# Patient Record
Sex: Female | Born: 1945 | Race: Black or African American | Hispanic: No | Marital: Single | Smoking: Former smoker
Health system: Southern US, Community
[De-identification: ages and names within clinical notes are randomized; demographics above are authoritative.]

## PROBLEM LIST (undated history)

## (undated) DIAGNOSIS — I2510 Atherosclerotic heart disease of native coronary artery without angina pectoris: Secondary | ICD-10-CM

## (undated) DIAGNOSIS — Z95810 Presence of automatic (implantable) cardiac defibrillator: Secondary | ICD-10-CM

## (undated) DIAGNOSIS — Z951 Presence of aortocoronary bypass graft: Secondary | ICD-10-CM

## (undated) DIAGNOSIS — N184 Chronic kidney disease, stage 4 (severe): Secondary | ICD-10-CM

## (undated) DIAGNOSIS — I255 Ischemic cardiomyopathy: Secondary | ICD-10-CM

## (undated) DIAGNOSIS — I5040 Unspecified combined systolic (congestive) and diastolic (congestive) heart failure: Secondary | ICD-10-CM

## (undated) DIAGNOSIS — I10 Essential (primary) hypertension: Secondary | ICD-10-CM

## (undated) DIAGNOSIS — I219 Acute myocardial infarction, unspecified: Secondary | ICD-10-CM

## (undated) DIAGNOSIS — E118 Type 2 diabetes mellitus with unspecified complications: Secondary | ICD-10-CM

## (undated) DIAGNOSIS — E785 Hyperlipidemia, unspecified: Secondary | ICD-10-CM

## (undated) DIAGNOSIS — R7401 Elevation of levels of liver transaminase levels: Secondary | ICD-10-CM

## (undated) DIAGNOSIS — R570 Cardiogenic shock: Secondary | ICD-10-CM

## (undated) DIAGNOSIS — R740 Nonspecific elevation of levels of transaminase and lactic acid dehydrogenase [LDH]: Secondary | ICD-10-CM

## (undated) DIAGNOSIS — K529 Noninfective gastroenteritis and colitis, unspecified: Secondary | ICD-10-CM

## (undated) DIAGNOSIS — K559 Vascular disorder of intestine, unspecified: Secondary | ICD-10-CM

## (undated) DIAGNOSIS — I272 Pulmonary hypertension, unspecified: Secondary | ICD-10-CM

## (undated) HISTORY — PX: CORONARY ARTERY BYPASS GRAFT: SHX141

---

## 2002-10-27 DIAGNOSIS — I2510 Atherosclerotic heart disease of native coronary artery without angina pectoris: Secondary | ICD-10-CM

## 2002-10-27 DIAGNOSIS — I219 Acute myocardial infarction, unspecified: Secondary | ICD-10-CM

## 2002-10-27 DIAGNOSIS — Z951 Presence of aortocoronary bypass graft: Secondary | ICD-10-CM

## 2002-10-27 HISTORY — DX: Acute myocardial infarction, unspecified: I21.9

## 2002-10-27 HISTORY — DX: Atherosclerotic heart disease of native coronary artery without angina pectoris: I25.10

## 2002-10-27 HISTORY — DX: Presence of aortocoronary bypass graft: Z95.1

## 2002-11-05 ENCOUNTER — Inpatient Hospital Stay (HOSPITAL_COMMUNITY): Admission: EM | Admit: 2002-11-05 | Discharge: 2002-11-17 | Payer: Self-pay | Admitting: Emergency Medicine

## 2002-11-06 ENCOUNTER — Encounter: Payer: Self-pay | Admitting: Cardiology

## 2002-11-09 ENCOUNTER — Encounter: Payer: Self-pay | Admitting: Cardiothoracic Surgery

## 2002-11-11 ENCOUNTER — Encounter: Payer: Self-pay | Admitting: Cardiothoracic Surgery

## 2002-11-12 ENCOUNTER — Encounter: Payer: Self-pay | Admitting: Thoracic Surgery (Cardiothoracic Vascular Surgery)

## 2002-11-13 ENCOUNTER — Encounter: Payer: Self-pay | Admitting: Cardiothoracic Surgery

## 2012-05-12 ENCOUNTER — Other Ambulatory Visit: Payer: Self-pay | Admitting: Nurse Practitioner

## 2012-05-12 DIAGNOSIS — E118 Type 2 diabetes mellitus with unspecified complications: Secondary | ICD-10-CM

## 2012-05-12 MED ORDER — GLIMEPIRIDE 4 MG PO TABS: 4.0000 mg | ORAL_TABLET | Freq: Every day | ORAL | Status: DC

## 2012-05-20 ENCOUNTER — Telehealth: Payer: Self-pay | Admitting: Nurse Practitioner

## 2012-05-20 NOTE — Telephone Encounter (Signed)
Please call about Jo Lynn's meds. She will be there at this number until 9 pm tonight.

## 2012-05-22 ENCOUNTER — Telehealth: Payer: Self-pay | Admitting: Family Medicine

## 2012-05-22 NOTE — Telephone Encounter (Signed)
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

## 2012-08-03 ENCOUNTER — Telehealth: Payer: Self-pay | Admitting: Nurse Practitioner

## 2012-08-04 NOTE — Telephone Encounter (Signed)
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

## 2012-08-14 NOTE — Telephone Encounter (Signed)
No response

## 2012-08-17 ENCOUNTER — Encounter: Payer: Self-pay | Admitting: Nurse Practitioner

## 2012-08-17 ENCOUNTER — Ambulatory Visit (INDEPENDENT_AMBULATORY_CARE_PROVIDER_SITE_OTHER): Payer: BLUE CROSS/BLUE SHIELD | Admitting: Nurse Practitioner

## 2012-08-17 VITALS — BP 135/64 | HR 55 | Temp 98.1°F | Ht 66.0 in | Wt 207.0 lb

## 2012-08-17 DIAGNOSIS — E119 Type 2 diabetes mellitus without complications: Secondary | ICD-10-CM

## 2012-08-17 DIAGNOSIS — I10 Essential (primary) hypertension: Secondary | ICD-10-CM

## 2012-08-17 DIAGNOSIS — D649 Anemia, unspecified: Secondary | ICD-10-CM

## 2012-08-17 DIAGNOSIS — E118 Type 2 diabetes mellitus with unspecified complications: Secondary | ICD-10-CM | POA: Insufficient documentation

## 2012-08-17 DIAGNOSIS — E785 Hyperlipidemia, unspecified: Secondary | ICD-10-CM

## 2012-08-17 MED ORDER — GLYBURIDE 5 MG PO TABS: 5.0000 mg | ORAL_TABLET | Freq: Every day | ORAL | Status: DC

## 2012-08-17 MED ORDER — SAXAGLIPTIN HCL 2.5 MG PO TABS: 2.5000 mg | ORAL_TABLET | Freq: Every day | ORAL | Status: DC

## 2012-08-17 MED ORDER — ATORVASTATIN CALCIUM 40 MG PO TABS: 40.0000 mg | ORAL_TABLET | Freq: Every day | ORAL | Status: DC

## 2012-08-17 MED ORDER — GLIMEPIRIDE 4 MG PO TABS: 4.0000 mg | ORAL_TABLET | Freq: Every day | ORAL | Status: DC

## 2012-08-17 MED ORDER — LISINOPRIL-HYDROCHLOROTHIAZIDE 20-25 MG PO TABS: 1.0000 | ORAL_TABLET | Freq: Every day | ORAL | Status: DC

## 2012-08-17 MED ORDER — METOPROLOL TARTRATE 50 MG PO TABS: 50.0000 mg | ORAL_TABLET | Freq: Two times a day (BID) | ORAL | Status: DC

## 2012-08-17 NOTE — Progress Notes (Signed)
Subjective:    Patient ID: Jo Lynn, female    DOB: 1945/03/28, 67 y.o.   MRN: [id number]  Hypertension This is a chronic problem. The current episode started more than 1 year ago. The problem has been resolved since onset. The problem is controlled. Pertinent negatives include no blurred vision, chest pain, headaches, orthopnea, palpitations, peripheral edema, shortness of breath or sweats. There are no associated agents to hypertension. Risk factors for coronary artery disease include dyslipidemia, diabetes mellitus, obesity and post-menopausal state. Past treatments include ACE inhibitors and diuretics. The current treatment provides no improvement. CAD/MI: open heart surgery in 2006.  Hyperlipidemia This is a chronic problem. The current episode started more than 1 year ago. The problem is uncontrolled. Recent lipid tests were reviewed and are high. Exacerbating diseases include diabetes and obesity. Pertinent negatives include no chest pain or shortness of breath. Current antihyperlipidemic treatment includes statins and bile acid squestrants. The current treatment provides moderate improvement of lipids. Compliance problems include adherence to diet and adherence to exercise.  Risk factors for coronary artery disease include diabetes mellitus, hypertension, obesity and post-menopausal.  Diabetes She presents for her follow-up diabetic visit. She has type 2 diabetes mellitus. The initial diagnosis of diabetes was made 10 years ago. Her disease course has been fluctuating. There are no hypoglycemic associated symptoms. Pertinent negatives for hypoglycemia include no headaches or sweats. Pertinent negatives for diabetes include no blurred vision, no chest pain, no polydipsia, no polyphagia, no polyuria, no visual change, no weakness and no weight loss. There are no hypoglycemic complications. Symptoms are improving. There are no diabetic complications. Risk factors for coronary artery disease  include dyslipidemia, hypertension, obesity and post-menopausal. Current diabetic treatment includes diet and oral agent (dual therapy). She is compliant with treatment all of the time. Her weight is stable. When asked about meal planning, she reported none. She has not had a previous visit with a dietician. She rarely participates in exercise. Her home blood glucose trend is fluctuating minimally. Her breakfast blood glucose is taken between 8-9 am. Her breakfast blood glucose range is generally 140-180 mg/dl. Her overall blood glucose range is 140-180 mg/dl. An ACE inhibitor/angiotensin II receptor blocker is being taken. She does not see a podiatrist.Eye exam is current (july 2013).       Review of Systems  Constitutional: Negative for weight loss.  Eyes: Negative for blurred vision.  Respiratory: Negative for shortness of breath.   Cardiovascular: Negative for chest pain, palpitations and orthopnea.  Endocrine: Negative for polydipsia, polyphagia and polyuria.  Neurological: Negative for weakness and headaches.  All other systems reviewed and are negative.       Objective:   Physical Exam  Constitutional: She is oriented to person, place, and time. She appears well-developed and well-nourished.  HENT:  Nose: Nose normal.  Mouth/Throat: Oropharynx is clear and moist.  Eyes: EOM are normal.  Neck: Trachea normal, normal range of motion and full passive range of motion without pain. Neck supple. No JVD present. Carotid bruit is not present. No thyromegaly present.  Cardiovascular: Normal rate, regular rhythm, normal heart sounds and intact distal pulses.  Exam reveals no gallop and no friction rub.   No murmur heard. Pulmonary/Chest: Effort normal and breath sounds normal.  Abdominal: Soft. Bowel sounds are normal. She exhibits no distension and no mass. There is no tenderness.  Musculoskeletal: Normal range of motion.  Lymphadenopathy:    She has no cervical adenopathy.   Neurological: She is alert and oriented to person,  place, and time. She has normal reflexes.  Skin: Skin is warm and dry.  Psychiatric: She has a normal mood and affect. Her behavior is normal. Judgment and thought content normal.   BP 135/64  Pulse 55  Temp(Src) 98.1 F (36.7 C) (Oral)  Ht 5\' 6"  (1.676 m)  Wt 207 lb (93.895 kg)  BMI 33.43 kg/m2  Outside labs HgBA1c was 8.7%      Assessment & Plan:  1. Diabetes Discussed needing insulin but patient refuses  added glyburide to see if will help Daily fasting blood sugars- keep in diary - glyBURIDE (DIABETA) 5 MG tablet; Take 1 tablet (5 mg total) by mouth daily with breakfast.  Dispense: 90 tablet; Refill: 1 - saxagliptin HCl (ONGLYZA) 2.5 MG TABS tablet; Take 1 tablet (2.5 mg total) by mouth daily.  Dispense: 90 tablet; Refill: 1 - glimepiride (AMARYL) 4 MG tablet; Take 1 tablet (4 mg total) by mouth daily before breakfast.  Dispense: 90 tablet; Refill: 1  2. Hypertension Low NA+ diet - aspirin EC 81 MG tablet; Take 81 mg by mouth daily. - lisinopril-hydrochlorothiazide (PRINZIDE,ZESTORETIC) 20-25 MG per tablet; Take 1 tablet by mouth daily.  Dispense: 90 tablet; Refill: 1 - metoprolol (LOPRESSOR) 50 MG tablet; Take 1 tablet (50 mg total) by mouth 2 (two) times daily.  Dispense: 180 tablet; Refill: 1  3. Hyperlipidemia low fat diet an dexercise - WELCHOL 625 MG tablet;  - atorvastatin (LIPITOR) 40 MG tablet; Take 1 tablet (40 mg total) by mouth daily.  Dispense: 90 tablet; Refill: 1  4. Anemia  Mary-Margaret Daphine Deutscher, FNP

## 2012-08-17 NOTE — Patient Instructions (Signed)

## 2012-08-31 ENCOUNTER — Other Ambulatory Visit: Payer: Self-pay | Admitting: Nurse Practitioner

## 2012-08-31 DIAGNOSIS — E119 Type 2 diabetes mellitus without complications: Secondary | ICD-10-CM

## 2012-08-31 MED ORDER — GLYBURIDE 5 MG PO TABS: 5.0000 mg | ORAL_TABLET | Freq: Two times a day (BID) | ORAL | Status: DC

## 2012-09-07 ENCOUNTER — Telehealth: Payer: Self-pay | Admitting: Nurse Practitioner

## 2012-09-07 NOTE — Telephone Encounter (Signed)
Spoke with patient.

## 2013-01-24 ENCOUNTER — Other Ambulatory Visit: Payer: Self-pay | Admitting: Nurse Practitioner

## 2013-01-26 NOTE — Telephone Encounter (Signed)
NTBS.

## 2013-01-26 NOTE — Telephone Encounter (Signed)
Last seen and last lipid and glucose 08/17/12  MMM  Requesting a 90 day supply

## 2013-04-21 ENCOUNTER — Ambulatory Visit (INDEPENDENT_AMBULATORY_CARE_PROVIDER_SITE_OTHER): Payer: BC Managed Care – PPO | Admitting: Nurse Practitioner

## 2013-04-21 ENCOUNTER — Encounter: Payer: Self-pay | Admitting: Nurse Practitioner

## 2013-04-21 VITALS — BP 161/72 | HR 45 | Temp 96.8°F | Ht 66.0 in | Wt 208.0 lb

## 2013-04-21 DIAGNOSIS — I10 Essential (primary) hypertension: Secondary | ICD-10-CM

## 2013-04-21 DIAGNOSIS — E785 Hyperlipidemia, unspecified: Secondary | ICD-10-CM

## 2013-04-21 DIAGNOSIS — E119 Type 2 diabetes mellitus without complications: Secondary | ICD-10-CM

## 2013-04-21 DIAGNOSIS — D649 Anemia, unspecified: Secondary | ICD-10-CM

## 2013-04-21 LAB — POCT GLYCOSYLATED HEMOGLOBIN (HGB A1C): Hemoglobin A1C: 8.9

## 2013-04-21 MED ORDER — METOPROLOL TARTRATE 50 MG PO TABS: ORAL_TABLET | ORAL | Status: DC

## 2013-04-21 MED ORDER — ATORVASTATIN CALCIUM 40 MG PO TABS: ORAL_TABLET | ORAL | Status: DC

## 2013-04-21 MED ORDER — GLYBURIDE 5 MG PO TABS: ORAL_TABLET | ORAL | Status: DC

## 2013-04-21 MED ORDER — LISINOPRIL-HYDROCHLOROTHIAZIDE 20-25 MG PO TABS: 1.0000 | ORAL_TABLET | Freq: Every day | ORAL | Status: DC

## 2013-04-21 NOTE — Patient Instructions (Signed)

## 2013-04-21 NOTE — Progress Notes (Signed)
Subjective:    Patient ID: Jo Lynn, female    DOB: 07-05-1945, 68 y.o.   MRN: [id number]  Patient in today for follow up of chronic medical problems  Hypertension This is a chronic problem. The current episode started more than 1 year ago. The problem has been resolved since onset. The problem is controlled. Pertinent negatives include no blurred vision, chest pain, headaches, orthopnea, palpitations, peripheral edema, shortness of breath or sweats. There are no associated agents to hypertension. Risk factors for coronary artery disease include dyslipidemia, diabetes mellitus, obesity and post-menopausal state. Past treatments include ACE inhibitors and diuretics. The current treatment provides no improvement. CAD/MI: open heart surgery in 2006.  Hyperlipidemia This is a chronic problem. The current episode started more than 1 year ago. The problem is uncontrolled. Recent lipid tests were reviewed and are high. Exacerbating diseases include diabetes and obesity. Pertinent negatives include no chest pain or shortness of breath. Current antihyperlipidemic treatment includes statins and bile acid squestrants. The current treatment provides moderate improvement of lipids. Compliance problems include adherence to diet and adherence to exercise.  Risk factors for coronary artery disease include diabetes mellitus, hypertension, obesity and post-menopausal.  Diabetes She presents for her follow-up diabetic visit. She has type 2 diabetes mellitus. The initial diagnosis of diabetes was made 10 years ago. Her disease course has been fluctuating. There are no hypoglycemic associated symptoms. Pertinent negatives for hypoglycemia include no headaches or sweats. Pertinent negatives for diabetes include no blurred vision, no chest pain, no polydipsia, no polyphagia, no polyuria, no visual change, no weakness and no weight loss. There are no hypoglycemic complications. Symptoms are improving. There are no diabetic  complications. Risk factors for coronary artery disease include dyslipidemia, hypertension, obesity and post-menopausal. Current diabetic treatment includes diet and oral agent (dual therapy). She is compliant with treatment all of the time. Her weight is stable. When asked about meal planning, she reported none. She has not had a previous visit with a dietician. She rarely participates in exercise. Her home blood glucose trend is fluctuating minimally. Her breakfast blood glucose is taken between 8-9 am. Her breakfast blood glucose range is generally 140-180 mg/dl. Her overall blood glucose range is 140-180 mg/dl. An ACE inhibitor/angiotensin II receptor blocker is being taken. She does not see a podiatrist.Eye exam is not current.       Review of Systems  Constitutional: Negative for weight loss.  Eyes: Negative for blurred vision.  Respiratory: Negative for shortness of breath.   Cardiovascular: Negative for chest pain, palpitations and orthopnea.  Endocrine: Negative for polydipsia, polyphagia and polyuria.  Neurological: Negative for weakness and headaches.  All other systems reviewed and are negative.       Objective:   Physical Exam  Constitutional: She is oriented to person, place, and time. She appears well-developed and well-nourished.  HENT:  Nose: Nose normal.  Mouth/Throat: Oropharynx is clear and moist.  Eyes: EOM are normal.  Neck: Trachea normal, normal range of motion and full passive range of motion without pain. Neck supple. No JVD present. Carotid bruit is not present. No thyromegaly present.  Cardiovascular: Normal rate, regular rhythm, normal heart sounds and intact distal pulses.  Exam reveals no gallop and no friction rub.   No murmur heard. Pulmonary/Chest: Effort normal and breath sounds normal.  Abdominal: Soft. Bowel sounds are normal. She exhibits no distension and no mass. There is no tenderness.  Musculoskeletal: Normal range of motion.  Lymphadenopathy:     She has no cervical  adenopathy.  Neurological: She is alert and oriented to person, place, and time. She has normal reflexes.  Skin: Skin is warm and dry.  Psychiatric: She has a normal mood and affect. Her behavior is normal. Judgment and thought content normal.    BP 161/72  Pulse 45  Temp(Src) 96.8 F (36 C) (Oral)  Ht $R'5\' 6"'rQ$  (1.676 m)  Wt 208 lb (94.348 kg)  BMI 33.59 kg/m2 Results for orders placed in visit on 04/21/13  POCT GLYCOSYLATED HEMOGLOBIN (HGB A1C)      Result Value Ref Range   Hemoglobin A1C 8.9            Assessment & Plan:   1. Hypertension   2. Hyperlipidemia   3. Diabetes   4. Anemia    Orders Placed This Encounter  Procedures  . CMP14+EGFR  . NMR, lipoprofile  . POCT glycosylated hemoglobin (Hb A1C)   Meds ordered this encounter  Medications  . metoprolol (LOPRESSOR) 50 MG tablet    Sig: TAKE 1 TABLET TWICE A DAY    Dispense:  180 tablet    Refill:  1    Order Specific Question:  Supervising Provider    Answer:  Chipper Herb [[id number]]  . lisinopril-hydrochlorothiazide (PRINZIDE,ZESTORETIC) 20-25 MG per tablet    Sig: Take 1 tablet by mouth daily.    Dispense:  90 tablet    Refill:  1    Order Specific Question:  Supervising Provider    Answer:  Chipper Herb [[id number]]  . atorvastatin (LIPITOR) 40 MG tablet    Sig: TAKE 1 TABLET DAILY    Dispense:  90 tablet    Refill:  1    Order Specific Question:  Supervising Provider    Answer:  Chipper Herb [[id number]]  . glyBURIDE (DIABETA) 5 MG tablet    Sig: TAKE 1 TABLET TWICE A DAY.    Dispense:  180 tablet    Refill:  1    Order Specific Question:  Supervising Provider    Answer:  Chipper Herb [[id number]]  hemoccult cards patient just started taking onglyza 1 week ago- watch diet Labs pending Health maintenance reviewed Diet and exercise encouraged Continue all meds Follow up  In 3 months   Aguilar, FNP

## 2013-04-23 LAB — CMP14+EGFR
ALT: 13 IU/L (ref 0–32)
AST: 15 IU/L (ref 0–40)
Albumin/Globulin Ratio: 1.6 (ref 1.1–2.5)
Albumin: 4.3 g/dL (ref 3.6–4.8)
Alkaline Phosphatase: 87 IU/L (ref 39–117)
BUN/Creatinine Ratio: 26 (ref 11–26)
BUN: 37 mg/dL — ABNORMAL HIGH (ref 8–27)
CALCIUM: 9.6 mg/dL (ref 8.7–10.3)
CHLORIDE: 104 mmol/L (ref 97–108)
CO2: 20 mmol/L (ref 18–29)
Creatinine, Ser: 1.41 mg/dL — ABNORMAL HIGH (ref 0.57–1.00)
GFR calc Af Amer: 44 mL/min/{1.73_m2} — ABNORMAL LOW (ref >59)
GFR calc non Af Amer: 38 mL/min/{1.73_m2} — ABNORMAL LOW (ref >59)
GLUCOSE: 147 mg/dL — AB (ref 65–99)
Globulin, Total: 2.7 g/dL (ref 1.5–4.5)
POTASSIUM: 4.7 mmol/L (ref 3.5–5.2)
Sodium: 141 mmol/L (ref 134–144)
TOTAL PROTEIN: 7 g/dL (ref 6.0–8.5)
Total Bilirubin: 0.4 mg/dL (ref 0.0–1.2)

## 2013-04-23 LAB — NMR, LIPOPROFILE
Cholesterol: 227 mg/dL — ABNORMAL HIGH (ref <200)
HDL Cholesterol by NMR: 33 mg/dL — ABNORMAL LOW (ref >=40)
HDL PARTICLE NUMBER: 24.2 umol/L — AB (ref >=30.5)
LDL Particle Number: 2089 nmol/L — ABNORMAL HIGH (ref <1000)
LDL SIZE: 20.5 nm — AB (ref >20.5)
LDLC SERPL CALC-MCNC: 165 mg/dL — ABNORMAL HIGH (ref <100)
LP-IR SCORE: 62 — AB (ref <=45)
SMALL LDL PARTICLE NUMBER: 1331 nmol/L — AB (ref <=527)
Triglycerides by NMR: 147 mg/dL (ref <150)

## 2013-04-24 ENCOUNTER — Other Ambulatory Visit: Payer: Self-pay | Admitting: Nurse Practitioner

## 2013-04-26 ENCOUNTER — Telehealth: Payer: Self-pay | Admitting: *Deleted

## 2013-04-26 NOTE — Telephone Encounter (Signed)
Message copied by Josephine Cables on Mon Apr 26, 2013 12:07 PM ------      Message from: Chevis Pretty      Created: Fri Apr 23, 2013  5:05 PM       hgba1c discussed at appointment- patient had just started on onglyza      Nothing to compare kidney and liver function to- creatine elevated- need strict diabetic control because affecting her kidneys      LDL particle # elevated as well as LDL- need to continue lipitor daily and watch diet- will recheck in 3 months ------

## 2013-05-12 NOTE — Telephone Encounter (Signed)
Pt aware of lab results.  She made 54m appt w/ labs for 06/21/13 w/ Mae.   rs

## 2013-06-21 ENCOUNTER — Encounter: Payer: Self-pay | Admitting: General Practice

## 2013-06-21 ENCOUNTER — Ambulatory Visit (INDEPENDENT_AMBULATORY_CARE_PROVIDER_SITE_OTHER): Payer: BC Managed Care – PPO | Admitting: General Practice

## 2013-06-21 VITALS — BP 136/57 | HR 49 | Temp 97.1°F | Ht 66.0 in | Wt 209.0 lb

## 2013-06-21 DIAGNOSIS — I10 Essential (primary) hypertension: Secondary | ICD-10-CM

## 2013-06-21 DIAGNOSIS — E785 Hyperlipidemia, unspecified: Secondary | ICD-10-CM

## 2013-06-21 DIAGNOSIS — E119 Type 2 diabetes mellitus without complications: Secondary | ICD-10-CM

## 2013-06-21 NOTE — Patient Instructions (Signed)

## 2013-06-22 NOTE — Progress Notes (Signed)
Patient ID: Jo Lynn, female   DOB: 17-Sep-1945, 68 y.o.   MRN: [id number]  Patient presents to review and discuss labs drawn at work. Reviewed labs with patient, explaining HDL, LDL, HgbA1c which was 9.9, increased from last results from pcp. Discussed importance of healthy eating, taking medications as prescribed and some form of regular exercise. Maintain 3 month follow up with this office, which is due Jul 19, 2013. Patient verbalized understanding and in agreement.

## 2013-06-24 ENCOUNTER — Encounter: Payer: Self-pay | Admitting: General Practice

## 2013-09-09 ENCOUNTER — Inpatient Hospital Stay (HOSPITAL_COMMUNITY)
Admission: EM | Admit: 2013-09-09 | Discharge: 2013-09-14 | DRG: 248 | Disposition: A | Discharge status: Home / Self Care | Payer: BC Managed Care – PPO | Attending: Cardiology | Admitting: Cardiology

## 2013-09-09 ENCOUNTER — Inpatient Hospital Stay (HOSPITAL_COMMUNITY): Payer: BC Managed Care – PPO

## 2013-09-09 ENCOUNTER — Encounter (HOSPITAL_COMMUNITY)
Admission: EM | Disposition: A | Discharge status: Home / Self Care | Payer: BC Managed Care – PPO | Source: Home / Self Care | Attending: Cardiology

## 2013-09-09 ENCOUNTER — Encounter (HOSPITAL_COMMUNITY): Payer: Self-pay | Admitting: Emergency Medicine

## 2013-09-09 DIAGNOSIS — E1159 Type 2 diabetes mellitus with other circulatory complications: Secondary | ICD-10-CM

## 2013-09-09 DIAGNOSIS — I219 Acute myocardial infarction, unspecified: Secondary | ICD-10-CM

## 2013-09-09 DIAGNOSIS — J9600 Acute respiratory failure, unspecified whether with hypoxia or hypercapnia: Secondary | ICD-10-CM | POA: Diagnosis not present

## 2013-09-09 DIAGNOSIS — I2119 ST elevation (STEMI) myocardial infarction involving other coronary artery of inferior wall: Principal | ICD-10-CM | POA: Diagnosis present

## 2013-09-09 DIAGNOSIS — Z955 Presence of coronary angioplasty implant and graft: Secondary | ICD-10-CM

## 2013-09-09 DIAGNOSIS — I2510 Atherosclerotic heart disease of native coronary artery without angina pectoris: Secondary | ICD-10-CM | POA: Diagnosis present

## 2013-09-09 DIAGNOSIS — I213 ST elevation (STEMI) myocardial infarction of unspecified site: Secondary | ICD-10-CM

## 2013-09-09 DIAGNOSIS — Z6836 Body mass index (BMI) 36.0-36.9, adult: Secondary | ICD-10-CM | POA: Diagnosis not present

## 2013-09-09 DIAGNOSIS — Z7982 Long term (current) use of aspirin: Secondary | ICD-10-CM | POA: Diagnosis not present

## 2013-09-09 DIAGNOSIS — N189 Chronic kidney disease, unspecified: Secondary | ICD-10-CM | POA: Diagnosis present

## 2013-09-09 DIAGNOSIS — I5043 Acute on chronic combined systolic (congestive) and diastolic (congestive) heart failure: Secondary | ICD-10-CM | POA: Diagnosis present

## 2013-09-09 DIAGNOSIS — Z9189 Other specified personal risk factors, not elsewhere classified: Secondary | ICD-10-CM

## 2013-09-09 DIAGNOSIS — R0602 Shortness of breath: Secondary | ICD-10-CM

## 2013-09-09 DIAGNOSIS — I129 Hypertensive chronic kidney disease with stage 1 through stage 4 chronic kidney disease, or unspecified chronic kidney disease: Secondary | ICD-10-CM | POA: Diagnosis present

## 2013-09-09 DIAGNOSIS — R0789 Other chest pain: Secondary | ICD-10-CM | POA: Diagnosis present

## 2013-09-09 DIAGNOSIS — N289 Disorder of kidney and ureter, unspecified: Secondary | ICD-10-CM | POA: Diagnosis present

## 2013-09-09 DIAGNOSIS — I509 Heart failure, unspecified: Secondary | ICD-10-CM | POA: Diagnosis present

## 2013-09-09 DIAGNOSIS — E669 Obesity, unspecified: Secondary | ICD-10-CM | POA: Diagnosis present

## 2013-09-09 DIAGNOSIS — I10 Essential (primary) hypertension: Secondary | ICD-10-CM | POA: Diagnosis present

## 2013-09-09 DIAGNOSIS — I2582 Chronic total occlusion of coronary artery: Secondary | ICD-10-CM | POA: Diagnosis present

## 2013-09-09 DIAGNOSIS — E785 Hyperlipidemia, unspecified: Secondary | ICD-10-CM | POA: Diagnosis present

## 2013-09-09 DIAGNOSIS — R0902 Hypoxemia: Secondary | ICD-10-CM | POA: Diagnosis present

## 2013-09-09 DIAGNOSIS — I25810 Atherosclerosis of coronary artery bypass graft(s) without angina pectoris: Secondary | ICD-10-CM | POA: Diagnosis present

## 2013-09-09 DIAGNOSIS — Z79899 Other long term (current) drug therapy: Secondary | ICD-10-CM | POA: Diagnosis not present

## 2013-09-09 DIAGNOSIS — I255 Ischemic cardiomyopathy: Secondary | ICD-10-CM | POA: Clinically undetermined

## 2013-09-09 DIAGNOSIS — I2589 Other forms of chronic ischemic heart disease: Secondary | ICD-10-CM | POA: Diagnosis present

## 2013-09-09 DIAGNOSIS — D649 Anemia, unspecified: Secondary | ICD-10-CM | POA: Diagnosis present

## 2013-09-09 DIAGNOSIS — J9601 Acute respiratory failure with hypoxia: Secondary | ICD-10-CM

## 2013-09-09 DIAGNOSIS — Z87891 Personal history of nicotine dependence: Secondary | ICD-10-CM

## 2013-09-09 DIAGNOSIS — Z66 Do not resuscitate: Secondary | ICD-10-CM | POA: Diagnosis present

## 2013-09-09 DIAGNOSIS — I25700 Atherosclerosis of coronary artery bypass graft(s), unspecified, with unstable angina pectoris: Secondary | ICD-10-CM

## 2013-09-09 DIAGNOSIS — Y831 Surgical operation with implant of artificial internal device as the cause of abnormal reaction of the patient, or of later complication, without mention of misadventure at the time of the procedure: Secondary | ICD-10-CM | POA: Diagnosis present

## 2013-09-09 DIAGNOSIS — E118 Type 2 diabetes mellitus with unspecified complications: Secondary | ICD-10-CM | POA: Diagnosis present

## 2013-09-09 DIAGNOSIS — I25710 Atherosclerosis of autologous vein coronary artery bypass graft(s) with unstable angina pectoris: Secondary | ICD-10-CM | POA: Diagnosis present

## 2013-09-09 DIAGNOSIS — I5021 Acute systolic (congestive) heart failure: Secondary | ICD-10-CM

## 2013-09-09 HISTORY — DX: Atherosclerotic heart disease of native coronary artery without angina pectoris: I25.10

## 2013-09-09 HISTORY — PX: LEFT HEART CATHETERIZATION WITH CORONARY ANGIOGRAM: SHX5451

## 2013-09-09 HISTORY — DX: Unspecified combined systolic (congestive) and diastolic (congestive) heart failure: I50.40

## 2013-09-09 HISTORY — DX: Presence of aortocoronary bypass graft: Z95.1

## 2013-09-09 HISTORY — DX: Ischemic cardiomyopathy: I25.5

## 2013-09-09 HISTORY — DX: Type 2 diabetes mellitus with unspecified complications: E11.8

## 2013-09-09 HISTORY — DX: Essential (primary) hypertension: I10

## 2013-09-09 HISTORY — DX: Acute myocardial infarction, unspecified: I21.9

## 2013-09-09 HISTORY — DX: Hyperlipidemia, unspecified: E78.5

## 2013-09-09 LAB — HEMOGLOBIN A1C
Hgb A1c MFr Bld: 10.8 % — ABNORMAL HIGH (ref <5.7)
MEAN PLASMA GLUCOSE: 263 mg/dL — AB (ref <117)

## 2013-09-09 LAB — GLUCOSE, CAPILLARY
GLUCOSE-CAPILLARY: 376 mg/dL — AB (ref 70–99)
Glucose-Capillary: 383 mg/dL — ABNORMAL HIGH (ref 70–99)
Glucose-Capillary: 350 mg/dL — ABNORMAL HIGH (ref 70–99)

## 2013-09-09 LAB — COMPREHENSIVE METABOLIC PANEL
ALT: 19 U/L (ref 0–35)
AST: 23 U/L (ref 0–37)
Albumin: 3.9 g/dL (ref 3.5–5.2)
Alkaline Phosphatase: 86 U/L (ref 39–117)
Anion gap: 24 — ABNORMAL HIGH (ref 5–15)
BILIRUBIN TOTAL: 0.5 mg/dL (ref 0.3–1.2)
BUN: 36 mg/dL — ABNORMAL HIGH (ref 6–23)
CO2: 17 meq/L — AB (ref 19–32)
CREATININE: 1.16 mg/dL — AB (ref 0.50–1.10)
Calcium: 9.5 mg/dL (ref 8.4–10.5)
Chloride: 100 mEq/L (ref 96–112)
GFR, EST AFRICAN AMERICAN: 54 mL/min — AB (ref >90)
GFR, EST NON AFRICAN AMERICAN: 47 mL/min — AB (ref >90)
GLUCOSE: 391 mg/dL — AB (ref 70–99)
Potassium: 4.8 mEq/L (ref 3.7–5.3)
Sodium: 141 mEq/L (ref 137–147)
Total Protein: 7.8 g/dL (ref 6.0–8.3)

## 2013-09-09 LAB — CBC WITH DIFFERENTIAL/PLATELET
BASOS PCT: 0 % (ref 0–1)
Basophils Absolute: 0 10*3/uL (ref 0.0–0.1)
Eosinophils Absolute: 0 10*3/uL (ref 0.0–0.7)
Eosinophils Relative: 0 % (ref 0–5)
HEMATOCRIT: 36.7 % (ref 36.0–46.0)
HEMOGLOBIN: 12 g/dL (ref 12.0–15.0)
LYMPHS ABS: 0.5 10*3/uL — AB (ref 0.7–4.0)
LYMPHS PCT: 4 % — AB (ref 12–46)
MCH: 28.6 pg (ref 26.0–34.0)
MCHC: 32.7 g/dL (ref 30.0–36.0)
MCV: 87.6 fL (ref 78.0–100.0)
MONO ABS: 0.5 10*3/uL (ref 0.1–1.0)
MONOS PCT: 4 % (ref 3–12)
NEUTROS ABS: 13.2 10*3/uL — AB (ref 1.7–7.7)
NEUTROS PCT: 92 % — AB (ref 43–77)
Platelets: 270 10*3/uL (ref 150–400)
RBC: 4.19 MIL/uL (ref 3.87–5.11)
RDW: 15.8 % — ABNORMAL HIGH (ref 11.5–15.5)
WBC: 14.2 10*3/uL — ABNORMAL HIGH (ref 4.0–10.5)

## 2013-09-09 LAB — CREATININE, SERUM
Creatinine, Ser: 1.24 mg/dL — ABNORMAL HIGH (ref 0.50–1.10)
GFR calc non Af Amer: 43 mL/min — ABNORMAL LOW (ref >90)
GFR, EST AFRICAN AMERICAN: 50 mL/min — AB (ref >90)

## 2013-09-09 LAB — TSH: TSH: 0.072 u[IU]/mL — ABNORMAL LOW (ref 0.350–4.500)

## 2013-09-09 LAB — POCT ACTIVATED CLOTTING TIME
Activated Clotting Time: 495 seconds
Activated Clotting Time: 112 seconds

## 2013-09-09 LAB — TROPONIN I
Troponin I: >20 ng/mL (ref <0.30)
Troponin I: >20 ng/mL (ref <0.30)

## 2013-09-09 LAB — PROTIME-INR
INR: 1.02 (ref 0.00–1.49)
INR: 1.33 (ref 0.00–1.49)
Prothrombin Time: 13.4 seconds (ref 11.6–15.2)
Prothrombin Time: 16.5 seconds — ABNORMAL HIGH (ref 11.6–15.2)

## 2013-09-09 LAB — MRSA PCR SCREENING: MRSA by PCR: NEGATIVE

## 2013-09-09 LAB — CBC
HEMATOCRIT: 31.1 % — AB (ref 36.0–46.0)
Hemoglobin: 10 g/dL — ABNORMAL LOW (ref 12.0–15.0)
MCH: 28.2 pg (ref 26.0–34.0)
MCHC: 32.2 g/dL (ref 30.0–36.0)
MCV: 87.9 fL (ref 78.0–100.0)
Platelets: 239 10*3/uL (ref 150–400)
RBC: 3.54 MIL/uL — ABNORMAL LOW (ref 3.87–5.11)
RDW: 15.5 % (ref 11.5–15.5)
WBC: 8.8 10*3/uL (ref 4.0–10.5)

## 2013-09-09 LAB — PRO B NATRIURETIC PEPTIDE: Pro B Natriuretic peptide (BNP): 2561 pg/mL — ABNORMAL HIGH (ref 0–125)

## 2013-09-09 LAB — I-STAT TROPONIN, ED: TROPONIN I, POC: 0.01 ng/mL (ref 0.00–0.08)

## 2013-09-09 LAB — APTT
APTT: 45 s — AB (ref 24–37)
aPTT: 24 seconds (ref 24–37)

## 2013-09-09 LAB — MAGNESIUM: MAGNESIUM: 1.7 mg/dL (ref 1.5–2.5)

## 2013-09-09 SURGERY — LEFT HEART CATHETERIZATION WITH CORONARY ANGIOGRAM
Anesthesia: LOCAL

## 2013-09-09 MED ORDER — HEPARIN SODIUM (PORCINE) 5000 UNIT/ML IJ SOLN: 5000.0000 [IU] | Freq: Three times a day (TID) | INTRAMUSCULAR | Status: DC

## 2013-09-09 MED ORDER — TIROFIBAN HCL IV 5 MG/100ML
INTRAVENOUS | Status: AC
Start: 2013-09-09 — End: 2013-09-09
  Filled 2013-09-09: qty 100

## 2013-09-09 MED ORDER — ASPIRIN EC 81 MG PO TBEC: 81.0000 mg | DELAYED_RELEASE_TABLET | Freq: Every day | ORAL | Status: DC

## 2013-09-09 MED ORDER — INSULIN ASPART 100 UNIT/ML ~~LOC~~ SOLN: 0.0000 [IU] | Freq: Three times a day (TID) | SUBCUTANEOUS | Status: DC

## 2013-09-09 MED ORDER — ROCURONIUM BROMIDE 50 MG/5ML IV SOLN
INTRAVENOUS | Status: AC
  Filled 2013-09-09: qty 2

## 2013-09-09 MED ORDER — FENTANYL CITRATE 0.05 MG/ML IJ SOLN
INTRAMUSCULAR | Status: AC
  Filled 2013-09-09: qty 2

## 2013-09-09 MED ORDER — TICAGRELOR 90 MG PO TABS
ORAL_TABLET | ORAL | Status: AC
  Administered 2013-09-09: 90 mg via ORAL
  Filled 2013-09-09: qty 2

## 2013-09-09 MED ORDER — FUROSEMIDE 10 MG/ML IJ SOLN
20.0000 mg | Freq: Two times a day (BID) | INTRAMUSCULAR | Status: DC
  Administered 2013-09-09 – 2013-09-11 (×5): 20 mg via INTRAVENOUS
  Filled 2013-09-09 (×7): qty 2

## 2013-09-09 MED ORDER — TIROFIBAN HCL IV 5 MG/100ML
0.0750 ug/kg/min | INTRAVENOUS | Status: AC
  Filled 2013-09-09: qty 100

## 2013-09-09 MED ORDER — SODIUM CHLORIDE 0.9 % IV SOLN: INTRAVENOUS | Status: DC

## 2013-09-09 MED ORDER — ONDANSETRON HCL 4 MG/2ML IJ SOLN
INTRAMUSCULAR | Status: AC
  Administered 2013-09-09: 4 mg
  Filled 2013-09-09: qty 2

## 2013-09-09 MED ORDER — FUROSEMIDE 10 MG/ML IJ SOLN
INTRAMUSCULAR | Status: AC
  Administered 2013-09-10: 20 mg via INTRAVENOUS
  Filled 2013-09-09: qty 4

## 2013-09-09 MED ORDER — HEPARIN (PORCINE) IN NACL 2-0.9 UNIT/ML-% IJ SOLN
INTRAMUSCULAR | Status: AC
  Filled 2013-09-09: qty 1000

## 2013-09-09 MED ORDER — ASPIRIN 81 MG PO CHEW: 81.0000 mg | CHEWABLE_TABLET | Freq: Every day | ORAL | Status: DC

## 2013-09-09 MED ORDER — INSULIN ASPART 100 UNIT/ML ~~LOC~~ SOLN
0.0000 [IU] | SUBCUTANEOUS | Status: DC
  Administered 2013-09-09 – 2013-09-10 (×2): 15 [IU] via SUBCUTANEOUS
  Administered 2013-09-10 (×4): 4 [IU] via SUBCUTANEOUS
  Administered 2013-09-11 (×2): 7 [IU] via SUBCUTANEOUS
  Administered 2013-09-11: 3 [IU] via SUBCUTANEOUS

## 2013-09-09 MED ORDER — NITROGLYCERIN 0.2 MG/ML ON CALL CATH LAB
INTRAVENOUS | Status: AC
  Filled 2013-09-09: qty 1

## 2013-09-09 MED ORDER — INSULIN ASPART 100 UNIT/ML ~~LOC~~ SOLN
0.0000 [IU] | Freq: Three times a day (TID) | SUBCUTANEOUS | Status: DC
  Administered 2013-09-09 (×2): 15 [IU] via SUBCUTANEOUS

## 2013-09-09 MED ORDER — ACETAMINOPHEN 325 MG PO TABS: 650.0000 mg | ORAL_TABLET | ORAL | Status: DC | PRN

## 2013-09-09 MED ORDER — DOBUTAMINE IN D5W 4-5 MG/ML-% IV SOLN: 2.5000 ug/kg/min | INTRAVENOUS | Status: DC

## 2013-09-09 MED ORDER — ONDANSETRON HCL 4 MG/2ML IJ SOLN: 4.0000 mg | Freq: Four times a day (QID) | INTRAMUSCULAR | Status: DC | PRN

## 2013-09-09 MED ORDER — MORPHINE SULFATE 2 MG/ML IJ SOLN
INTRAMUSCULAR | Status: AC
  Filled 2013-09-09: qty 1

## 2013-09-09 MED ORDER — DOPAMINE HCL 40 MG/ML IV SOLN
2.0000 ug/kg/min | INTRAVENOUS | Status: DC
  Filled 2013-09-09: qty 40

## 2013-09-09 MED ORDER — BIOTENE DRY MOUTH MT LIQD
15.0000 mL | Freq: Three times a day (TID) | OROMUCOSAL | Status: DC
  Administered 2013-09-10 – 2013-09-12 (×8): 15 mL via OROMUCOSAL

## 2013-09-09 MED ORDER — FUROSEMIDE 10 MG/ML IJ SOLN
INTRAMUSCULAR | Status: AC
  Filled 2013-09-09: qty 4

## 2013-09-09 MED ORDER — ATORVASTATIN CALCIUM 80 MG PO TABS
80.0000 mg | ORAL_TABLET | Freq: Every day | ORAL | Status: DC
  Administered 2013-09-09 – 2013-09-13 (×5): 80 mg via ORAL
  Filled 2013-09-09 (×6): qty 1

## 2013-09-09 MED ORDER — ACETAMINOPHEN 325 MG PO TABS
650.0000 mg | ORAL_TABLET | ORAL | Status: DC | PRN
  Administered 2013-09-09 – 2013-09-11 (×3): 650 mg via ORAL
  Filled 2013-09-09 (×3): qty 2

## 2013-09-09 MED ORDER — ASPIRIN EC 81 MG PO TBEC
81.0000 mg | DELAYED_RELEASE_TABLET | Freq: Every day | ORAL | Status: DC
  Administered 2013-09-10 – 2013-09-14 (×5): 81 mg via ORAL
  Filled 2013-09-09 (×5): qty 1

## 2013-09-09 MED ORDER — MIDAZOLAM HCL 2 MG/2ML IJ SOLN
INTRAMUSCULAR | Status: AC
  Filled 2013-09-09: qty 4

## 2013-09-09 MED ORDER — LIDOCAINE HCL (CARDIAC) 20 MG/ML IV SOLN
INTRAVENOUS | Status: AC
  Filled 2013-09-09: qty 5

## 2013-09-09 MED ORDER — HEPARIN SODIUM (PORCINE) 5000 UNIT/ML IJ SOLN
5000.0000 [IU] | Freq: Three times a day (TID) | INTRAMUSCULAR | Status: DC
  Administered 2013-09-09 – 2013-09-14 (×15): 5000 [IU] via SUBCUTANEOUS
  Filled 2013-09-09 (×18): qty 1

## 2013-09-09 MED ORDER — FUROSEMIDE 10 MG/ML IJ SOLN
40.0000 mg | Freq: Once | INTRAMUSCULAR | Status: AC
  Administered 2013-09-09: 40 mg via INTRAVENOUS

## 2013-09-09 MED ORDER — NITROGLYCERIN 0.4 MG SL SUBL: 0.4000 mg | SUBLINGUAL_TABLET | SUBLINGUAL | Status: DC | PRN

## 2013-09-09 MED ORDER — PANTOPRAZOLE SODIUM 40 MG IV SOLR
40.0000 mg | Freq: Every day | INTRAVENOUS | Status: DC
  Administered 2013-09-09: 40 mg via INTRAVENOUS
  Filled 2013-09-09 (×2): qty 40

## 2013-09-09 MED ORDER — BIVALIRUDIN 250 MG IV SOLR
INTRAVENOUS | Status: AC
  Filled 2013-09-09: qty 250

## 2013-09-09 MED ORDER — LIDOCAINE HCL (PF) 1 % IJ SOLN
INTRAMUSCULAR | Status: AC
  Filled 2013-09-09: qty 30

## 2013-09-09 MED ORDER — METOPROLOL TARTRATE 50 MG PO TABS
50.0000 mg | ORAL_TABLET | Freq: Two times a day (BID) | ORAL | Status: DC
  Administered 2013-09-09 – 2013-09-10 (×3): 50 mg via ORAL
  Filled 2013-09-09 (×6): qty 1

## 2013-09-09 MED ORDER — ONDANSETRON HCL 4 MG/2ML IJ SOLN
4.0000 mg | Freq: Four times a day (QID) | INTRAMUSCULAR | Status: DC | PRN
  Administered 2013-09-09 – 2013-09-10 (×3): 4 mg via INTRAVENOUS
  Filled 2013-09-09 (×3): qty 2

## 2013-09-09 MED ORDER — SUCCINYLCHOLINE CHLORIDE 20 MG/ML IJ SOLN
INTRAMUSCULAR | Status: AC
  Filled 2013-09-09: qty 1

## 2013-09-09 MED ORDER — TICAGRELOR 90 MG PO TABS
90.0000 mg | ORAL_TABLET | Freq: Two times a day (BID) | ORAL | Status: DC
  Administered 2013-09-09 – 2013-09-14 (×10): 90 mg via ORAL
  Filled 2013-09-09 (×11): qty 1

## 2013-09-09 MED ORDER — MORPHINE SULFATE 2 MG/ML IJ SOLN
2.0000 mg | Freq: Once | INTRAMUSCULAR | Status: AC
  Administered 2013-09-09: 2 mg via INTRAVENOUS

## 2013-09-09 MED ORDER — MIDAZOLAM HCL 2 MG/2ML IJ SOLN
INTRAMUSCULAR | Status: AC
  Filled 2013-09-09: qty 2

## 2013-09-09 MED ORDER — ETOMIDATE 2 MG/ML IV SOLN
INTRAVENOUS | Status: AC
  Filled 2013-09-09: qty 20

## 2013-09-09 MED ORDER — HEPARIN SODIUM (PORCINE) 5000 UNIT/ML IJ SOLN
INTRAMUSCULAR | Status: AC
  Administered 2013-09-09: 5000 [IU] via SUBCUTANEOUS
  Filled 2013-09-09: qty 1

## 2013-09-09 MED ORDER — PHENYLEPHRINE HCL 10 MG/ML IJ SOLN
30.0000 ug/min | INTRAVENOUS | Status: DC
  Filled 2013-09-09: qty 4

## 2013-09-09 NOTE — Brief Op Note (Signed)
   Brief Cardiac Catheterization Note:  NAME:  Jo Lynn   MRN: [id number] DOB:  08-23-1945   ADMIT DATE: 09/09/2013  Indication: 1. Inferolateral STEMI 2. 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 Left Heart Catheterization with Native Coroanry and Graft Angiography via Right Common Femoral Artery access  RFA 6Fr sheath - JL4, JR4 for LCA, RCA & SVG angiography -- after PCI, Angled Pigtail (LV Hemodynamics & LV Gram) then IMA for non-selective IMA angiography 2. Successful 2 site PCI of SVG-OM1-OM2 with 2 Rebel BMS stents:  4.0 mm x 12 mm in sequential limb (crossing initial anastomosis) & 4.0 mm x 16 mm mid primary limb; both post-dilated to 4.3 mm  AL1 Guide, Prowater wire; Emerge 2.5 mm x 12 mm predilation (reperfusion with wire) 3. Attempted, but aborted PTCA of SVG-rPDA -- aborted as the vessel appears chronically occluded.  AL1 Guide, Prowater wire; Emerge 2.5 mm x 12 mm predilation (reperfusion with wire)  Medications:  1 mg Versed IV; 50 mcg Fentanyl IV  Angiomax bolus & gtt  Aggrestat bolus & gtt  Brilinta 180 mg  IV Lasix 40mg   Impression:  Severe native CAD with 100% LAD, Cx & mid RCA (RVM-distal Cx collaterals)  Acute 100% thrombotic occlusion of SVG-OM1 with 95% stenosis is sequential limb just beyond OM1 anastomosis  Successful PCI of both lesions as noted above  Subacute to chronic 100% occlusion of SVG-rPDA  Widely patent LIMA-LAD  Hemodynamics:   Central AoP: 99/62/78 mmHg  LVP/EDP: 97/18/26 mmHg  Severe Ischemic Cardiomyopathy: LVEF ~20%  Recommendations:  Admit to ICU - not a Fast Track patient  Confirm EF with Echo  Hold ACE-I initially; will need IV diuresis & titration of CHF medications  DAPT x at least 1 month, but would continue x 1 yr.  Full note to follow  Leonie Man, M.D., M.S. Interventional Cardiologist   Pager # [phone]  09/09/2013 7:57 AM

## 2013-09-09 NOTE — Progress Notes (Signed)
Chaplain met briefly with family members and offered emotional support while patient was in the Cath Lab. Will follow up as needed.  cdb

## 2013-09-09 NOTE — ED Provider Notes (Signed)
CSN: [id number]     Arrival date & time 09/09/13  0539 History   First MD Initiated Contact with Patient 09/09/13 [phone]     Chief Complaint  Patient presents with  . Code STEMI     (Consider location/radiation/quality/duration/timing/severity/associated sxs/prior Treatment) HPI Patient presents with left lateral chest pain that radiates into her left thoracic back. This started at roughly 4:45 AM. EMS was called. Patient has had multiple episodes of vomiting. She was given a 325 mg aspirin and 3 sublingual nitroglycerin. She was also given 2 doses of morphine. Her vital signs remained stable in route. Patient does have a history of coronary artery disease requiring bypass grafting in 2004. EMS EKG with ST segment elevation in code STEMI was activated. Past Medical History  Diagnosis Date  . Diabetes mellitus without complication   . Hypertension   . Hyperlipidemia   . Cardiovascular disease   . Coronary artery disease    Past Surgical History  Procedure Laterality Date  . Coronary artery bypass graft     Family History  Problem Relation Age of Onset  . Diabetes Mother   . Diabetes Father    History  Substance Use Topics  . Smoking status: Former Research scientist (life sciences)  . Smokeless tobacco: Not on file  . Alcohol Use: No   OB History   Grav Para Term Preterm Abortions TAB SAB Ect Mult Living                 Review of Systems  Unable to perform ROS: Acuity of condition  Cardiovascular: Positive for chest pain.  Gastrointestinal: Positive for nausea and vomiting.  All other systems reviewed and are negative.     Allergies  Review of patient's allergies indicates no known allergies.  Home Medications   Prior to Admission medications   Medication Sig Start Date End Date Taking? Authorizing Provider  aspirin EC 81 MG tablet Take 325 mg by mouth daily.    Yes Historical Provider, MD  atorvastatin (LIPITOR) 40 MG tablet TAKE 1 TABLET DAILY 04/21/13  Yes Mary-Margaret Hassell Done, FNP   glyBURIDE (DIABETA) 5 MG tablet TAKE 1 TABLET TWICE A DAY. 04/21/13  Yes Mary-Margaret Hassell Done, FNP  metoprolol (LOPRESSOR) 50 MG tablet TAKE 1 TABLET TWICE A DAY 04/21/13  Yes Mary-Margaret Hassell Done, FNP  [facility] 625 MG tablet 625 mg 6 (six) times daily.  07/27/12  Yes Historical Provider, MD  lisinopril-hydrochlorothiazide (PRINZIDE,ZESTORETIC) 20-25 MG per tablet Take 1 tablet by mouth daily. 04/21/13   Mary-Margaret Hassell Done, FNP  saxagliptin HCl (ONGLYZA) 2.5 MG TABS tablet Take 1 tablet (2.5 mg total) by mouth daily. 08/17/12   Mary-Margaret Hassell Done, FNP   BP 121/98  Resp 25  Ht 5\' 7"  (1.702 m)  Wt 209 lb (94.802 kg)  BMI 32.73 kg/m2  SpO2 94% Physical Exam  Nursing note and vitals reviewed. Constitutional: She is oriented to person, place, and time. She appears well-developed and well-nourished. No distress.  Actively vomiting in the emergency department.  HENT:  Head: Normocephalic and atraumatic.  Mouth/Throat: Oropharynx is clear and moist.  Eyes: EOM are normal. Pupils are equal, round, and reactive to light.  Neck: Normal range of motion. Neck supple.  Cardiovascular: Normal rate and regular rhythm.  Exam reveals no gallop and no friction rub.   No murmur heard. Pulmonary/Chest: Effort normal and breath sounds normal. No respiratory distress. She has no wheezes. She has no rales. She exhibits no tenderness.  Abdominal: Soft. Bowel sounds are normal. She exhibits no distension and no  mass. There is no tenderness. There is no rebound and no guarding.  Musculoskeletal: Normal range of motion. She exhibits no edema and no tenderness.  Mild bilateral lower extremity edema.  Neurological: She is alert and oriented to person, place, and time.  Moves all extremities. Sensation is grossly intact.  Skin: Skin is warm and dry. No rash noted. No erythema.  Psychiatric: She has a normal mood and affect. Her behavior is normal.    ED Course  Procedures (including critical care time) Labs  Review Labs Reviewed  APTT  CBC  COMPREHENSIVE METABOLIC PANEL  [place], ED    Imaging Review No results found.   EKG Interpretation None     EKG with inferior and lateral ST elevation. MDM   Final diagnoses:  None   Dr. Radford Pax at bedside. Patient taken to cath lab.    Julianne Rice, MD 09/09/13 [phone]

## 2013-09-09 NOTE — Progress Notes (Signed)
Right femoral arterial sheath removed intact. Manual pressure held for 38min to right groin. Hemostasis achieved.  No bleeding or hematoma noted. 4x4 pressure dressing applied. Post activity and precautions explained; patient verbalized understanding. Right distal pulses intact. Nurse in to observe groin. Level 0 confirmed x2RNs. VSS. Call bell near.Jo Lynn

## 2013-09-09 NOTE — Interval H&P Note (Signed)
History and Physical Interval Note:  09/09/2013 6:34 AM  Catalina Pizza  has presented today for surgery, with the diagnosis of STEMI   LIMA-LAD, SVG-OM1-OM2, SVG-PDA in 2006   The various methods of treatment have been discussed with the patient and family. After consideration of risks, benefits and other options for treatment, the patient has consented to  Procedure(s): LEFT HEART CATHETERIZATION WITH CORONARY ANGIOGRAM (N/A) as a surgical intervention .  The patient's history has been reviewed, patient examined, no change in status, stable for surgery.  I have reviewed the patient's chart and labs.  Questions were answered to the patient's satisfaction.     Tawanda Schall W

## 2013-09-09 NOTE — Progress Notes (Signed)
MD paged about pt's anxiety and unable to wean from non-rebreather. PA called and said MD was coming to bedside.  Will continue to monitor closely and update as needed.

## 2013-09-09 NOTE — ED Notes (Signed)
To cath lab.

## 2013-09-09 NOTE — ED Notes (Signed)
Pt from home.  [facility] called out for c/o back pain/chest pain.  I/O to L leg in place.  Pt given 4mg  Morphine x 2, 325mg  Aspirin, and 3 SL Nitro prior to arrival.  Pt not on O2 per EMS she didn't c/o SOB.

## 2013-09-09 NOTE — H&P (Addendum)
Admit date: 09/09/2013 Referring Physician: Dr. Lita Mains Primary Cardiologist:   None Chief complaint/reason for admission:Chest pain  HPI: This is a 68yo AAF with a history of DM, HTN, dyslipidemia and ASCAD s/p remote CABG in 2004 who presents with chest pain.  She is a poor historian and could not tell me who her Cardiologist is or where she had her CABG.  She awakened about 4:45am with chest tightness and severe back pain.  She called EMS and was found by EKG to have acute inferior ST elevation c/w STEMI.  She was transferred to [facility].  Currently she complains of severe back pain but CP is better. She cannot given any other history due to severe pain.      PMH:    Past Medical History  Diagnosis Date  . Diabetes mellitus without complication   . Hypertension   . Hyperlipidemia   . Cardiovascular disease   . Coronary artery disease     PSH:    Past Surgical History  Procedure Laterality Date  . Coronary artery bypass graft      ALLERGIES:   Review of patient's allergies indicates no known allergies.  Prior to Admit Meds:   (Not in a hospital admission) Family HX:    Family History  Problem Relation Age of Onset  . Diabetes Mother   . Diabetes Father    Social HX:    History   Social History  . Marital Status: Single    Spouse Name: N/A    Number of Children: N/A  . Years of Education: N/A   Occupational History  . Not on file.   Social History Main Topics  . Smoking status: Former Research scientist (life sciences)  . Smokeless tobacco: Not on file  . Alcohol Use: No  . Drug Use: No  . Sexual Activity: Not on file   Other Topics Concern  . Not on file   Social History Narrative  . No narrative on file     ROS:  All 11 ROS were addressed and are negative except what is stated in the HPI  PHYSICAL EXAM Filed Vitals:   09/09/13 0546  BP: 121/98  Resp: 25   General: Well developed, well nourished, in no acute distress Head: Eyes PERRLA, No xanthomas.   Normal cephalic and  atramatic  Lungs:   Clear bilaterally to auscultation and percussion. Heart:   HRRR S1 S2 Pulses are 2+ & equal.            No carotid bruit. No JVD.  No abdominal bruits. No femoral bruits. Abdomen: Bowel sounds are positive, abdomen soft and non-tender without masses Extremities:   No clubbing, cyanosis or edema.  Trace DP pulses NEURO:  Alert and oriented X 3. Psych:  Good affect, responds appropriately   Labs:   No results found for this basename: WBC, HGB, HCT, MCV, PLT   No results found for this basename: NA, K, CL, CO2, BUN, CREATININE, CALCIUM, LABALBU, PROT, BILITOT, ALKPHOS, ALT, AST, GLUCOSE,  in the last 168 hours No results found for this basename: CKTOTAL, CKMB, CKMBINDEX, TROPONINI   No results found for this basename: PTT   No results found for this basename: INR, PROTIME     Lab Results  Component Value Date   CHOL 227* 04/21/2013   Lab Results  Component Value Date   HDL 33* 04/21/2013   Lab Results  Component Value Date   LDLCALC 165* 04/21/2013   Lab Results  Component Value Date   TRIG 147  04/21/2013   No results found for this basename: CHOLHDL   No results found for this basename: LDLDIRECT      Radiology:  No results found.  EKG:  NSR with 73mm of ST elevation in inferolateral leads with ST depression in V1 and V2 and early R wave transition in V2 c/w acute inferoposterolateral MI  ASSESSMENT:  1.  Acute inferoposterolateral STEMI 2.  ASCAD with remote CABG - patient cannot tell me where CABG was done and not notes in EPIC 3.  Dyslipidemia 4.  HTN 5.  DM  PLAN:   1.  Admit to CCU 2.  Emergent cath  3.  ASA/IV Heparin gtt 4.  Check FLP in am 5.  Increase Liptor to 80mg  daily 6.  Continue BB 7.  2D echo to assess LVF  Sueanne Margarita, MD  09/09/2013  5:57 AM

## 2013-09-09 NOTE — Progress Notes (Signed)
UR Completed.  Jo Lynn Jane [phone] 09/09/2013  

## 2013-09-09 NOTE — Progress Notes (Signed)
Lt. IO removed without difficulty. Vaseline gauze dsg applied site WNL.

## 2013-09-09 NOTE — Consult Note (Signed)
PULMONARY / CRITICAL CARE MEDICINE   Name: Jo Lynn MRN: [id number] DOB: 05/04/1945    ADMISSION DATE:  09/09/2013 CONSULTATION DATE:  09/09/2013  REFERRING MD :  Ellyn Hack PRIMARY SERVICE: PCCM  CHIEF COMPLAINT:  Dyspnea  BRIEF PATIENT DESCRIPTION: 68 yo female with h/o CAD, CABG, DM. Admitted to [facility] 7/16 as code STEMI. Had PCI x 2 vessels in cath lab. Later in day with respiratory distress. PCCM to see.   SIGNIFICANT EVENTS / STUDIES:  7/16 STEMI > to cath lab - successful 2 site PCI of SVG-OM1-OM2. LVEF 20% by LVgram  LINES / TUBES: Foley 7/16 >>>  CULTURES: None  ANTIBIOTICS: N/a  HISTORY OF PRESENT ILLNESS:  68 year old female with PMH as below, which includes MI, CABG x 4 (2004) and DM. Presented to [facility] ED 7/16 c/o chest pain. In ED found to have ST segment elevation in the inferior leads. Was admitted under cardiology and taken emergently to cath lab and underwent successful 2 site PCI of SVG-OM1-OM2 with 2 BMS stents. Did have issues with oxygenation and hypotension while in cath lab.    PAST MEDICAL HISTORY :  Past Medical History  Diagnosis Date  . Diabetes mellitus without complication   . Hypertension   . Hyperlipidemia   . Myocardial infarction 10/2002  . 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 Coronary artery bypass graft     Prior to Admission medications   Medication Sig Start Date End Date Taking? Authorizing Provider  aspirin 325 MG tablet Take 325 mg by mouth daily.   Yes Historical Provider, MD  atorvastatin (LIPITOR) 40 MG tablet Take 40 mg by mouth every morning.    Yes Historical Provider, MD  glyBURIDE (DIABETA) 5 MG tablet Take 5 mg by mouth 2 (two) times daily with a meal.   Yes Historical Provider, MD   lisinopril-hydrochlorothiazide (PRINZIDE,ZESTORETIC) 20-25 MG per tablet Take 1 tablet by mouth daily. 04/21/13  Yes Mary-Margaret Hassell Done, FNP  metoprolol (LOPRESSOR) 50 MG tablet Take 50 mg by mouth 2 (two) times daily.   Yes Historical Provider, MD  saxagliptin HCl (ONGLYZA) 2.5 MG TABS tablet Take 1 tablet (2.5 mg total) by mouth daily. 08/17/12  Yes Mary-Margaret Hassell Done, FNP   No Known Allergies  FAMILY HISTORY:  Family History  Problem Relation Age of Onset  . Diabetes Mother   . Diabetes Father    SOCIAL HISTORY:  reports that she has quit smoking. She does not have any smokeless tobacco history on file. She reports that she does not drink alcohol or use illicit drugs.  REVIEW OF SYSTEMS:  Deferred due to dyspnea  SUBJECTIVE:   VITAL SIGNS: Temp:  [97.6 F (36.4 C)-98.1 F (36.7 C)] 97.9 F (36.6 C) (07/16 1600) Pulse Rate:  [83-123] 89 (07/16 1720) Resp:  [13-34] 26 (07/16 1720) BP: (91-155)/(29-112) 99/58 mmHg (07/16 1700) SpO2:  [70 %-100 %] 92 % (07/16 1720) Arterial Line BP: (119-140)/(68-78) 119/68 mmHg (07/16 1300) Weight:  [94.802 kg (209 lb)] 94.802 kg (209 lb) (07/16 0559) HEMODYNAMICS:   VENTILATOR SETTINGS:   INTAKE / OUTPUT: Intake/Output     07/15 0701 - 07/16 0700 07/16 0701 - 07/17 0700   P.O.  480   I.V. (mL/kg)  79.8 (0.8)   Total Intake(mL/kg)  559.8 (5.9)   Urine (mL/kg/hr)  1225 (1.1)   Total Output   1225   Net   -665.3          PHYSICAL EXAMINATION: General: Obese female in respiratory distress Neuro:  Alert, oriented.  HEENT:  [place]/AT no JVD observed, difficult with neck girth.  Cardiovascular:  Tachy, regular Lungs:  Diffuse crackles, exp wheeze on left Abdomen:  Obese, soft, non-tender, non -distended Musculoskeletal:  No acute deformity or ROM limitation.  Skin:  Intact  LABS:  CBC  Recent Labs Lab 09/09/13 0700 09/09/13 1055  WBC 8.8 14.2*  HGB 10.0* 12.0  HCT 31.1* 36.7  PLT 239 270   Coag's  Recent Labs Lab  09/09/13 0550 09/09/13 1055  APTT 24 45*  INR 1.02 1.33   BMET  Recent Labs Lab 09/09/13 0550 09/09/13 1055  NA 141  --   K 4.8  --   CL 100  --   CO2 17*  --   BUN 36*  --   CREATININE 1.16* 1.24*  GLUCOSE 391*  --    Electrolytes  Recent Labs Lab 09/09/13 0550 09/09/13 1055  CALCIUM 9.5  --   MG  --  1.7   Sepsis Markers No results found for this basename: LATICACIDVEN, PROCALCITON, O2SATVEN,  in the last 168 hours ABG No results found for this basename: PHART, PCO2ART, PO2ART,  in the last 168 hours Liver Enzymes  Recent Labs Lab 09/09/13 0550  AST 23  ALT 19  ALKPHOS 86  BILITOT 0.5  ALBUMIN 3.9   Cardiac Enzymes  Recent Labs Lab 09/09/13 1055  TROPONINI >20.00*  PROBNP 2561.0*   Glucose  Recent Labs Lab 09/09/13 1145 09/09/13 1635  GLUCAP 383* 376*    Imaging No results found.   ASSESSMENT / PLAN:  PULMONARY A: Acute respiratory failure likely due to pulmonary edema Pulmonary Edema  P:   Continuous BiPAP Low threshold to intubate in setting of acute decompensation. Diuresis per primary team (Lasix 20mg  BID) F/u CXR  CARDIOVASCULAR A:  S/p PCI for STEMI Acute systolic CHF LVEF 10% CAD  P:  Management per cardiology If requires pressors, preferred agent levophed per cards Avoid tachycardia Continue metoprolol  RENAL A:   CKD baseline creat 1.4  P:   Monitor BMP KVO IVF  GASTROINTESTINAL A:   GI ppx  P:   PPI  HEMATOLOGIC A:   Anemia  P:  Follow cbc  INFECTIOUS A:   No acute evidence of infection  P:   Monitor clinically  ENDOCRINE A:  DM  P:   CBG checks and SSI while NPO  NEUROLOGIC A:   Lethargy - greatly improved on BiPAP  P:   Monitor   Georgann Housekeeper, ACNP [place] [facility]/Critical [facility] Pager [phone] or [phone]   I have personally obtained a history, examined the patient, evaluated laboratory and imaging results, formulated the assessment and plan and placed  orders. CRITICAL CARE: The patient is critically ill with multiple organ systems failure and requires high complexity decision making for assessment and support, frequent evaluation and titration of therapies, application of advanced monitoring technologies and extensive interpretation of multiple databases. Critical Care Time devoted to patient care services described in this note is 60 minutes.    Baltazar Apo, MD, PhD 09/09/2013, 11:18 PM [place] [facility] [phone] or if no answer [phone]

## 2013-09-09 NOTE — Progress Notes (Signed)
Foley huddle complete.  Pt being diuresed.  On bedrest from cath lab with femoral sheath in place. Pt very confused. Huddle complete with Lillia Abed. Tamala Julian, Holly Church and myself, Adella Hare.  Peri care done prior to sterile technique insertion of foley catheter.  No signs of trauma. Will monitor pt closely and update as needed.

## 2013-09-09 NOTE — Progress Notes (Signed)
Around 1800, pt became very anxious, 02 sats dropping on 100% non-rebreather.  MD paged and came to bedside. See MD notes for further intervention.  Emotional support given to pt & to family member at bedside. Will continue to monitor closely and update as needed.

## 2013-09-09 NOTE — Progress Notes (Signed)
Patient with severe left ventricular dysfunction. She had 2 stents in obtuse marginal earlier today. She has an ejection fraction of 15%. Over the course of the day, she has become progressively short of breath. Her oxygen requirement has increased. Her oxygen saturations have been decreasing.  She has been given more Lasix.  We gave 40 mg IV about 10 minutes ago and she has not put out much urine.  Regardless, her sats are in the low 80s on 100% NRB.  On exam, she has bilateral rales and coarse breath sounds.  She is tachypnic and anxious.  HR 110.  BP stable.   I had a long discussion with the patient's daughter and the patient. I explained to her that she was not getting enough oxygen. The patient is insistent that she just wants the heat and that she will be fine. I explained to her that we cannot take the mask off. The daughter understands the situation. The patient does not have any type of out of hospital DO NOT RESUSCITATE order. The patient has never expressed these types of wishes to her daughter.  The patient is a full code. I spoke to critical care. We will start BiPAP. If she does not respond to BiPAP, we'll prepare for intubation. I explained to the daughter that this will also help reduce the strain on the patient's heart.  Critical care time 35 minutes.

## 2013-09-10 ENCOUNTER — Encounter (HOSPITAL_COMMUNITY): Payer: Self-pay | Admitting: Cardiology

## 2013-09-10 ENCOUNTER — Inpatient Hospital Stay (HOSPITAL_COMMUNITY): Payer: BC Managed Care – PPO

## 2013-09-10 DIAGNOSIS — I5043 Acute on chronic combined systolic (congestive) and diastolic (congestive) heart failure: Secondary | ICD-10-CM | POA: Diagnosis not present

## 2013-09-10 DIAGNOSIS — J9600 Acute respiratory failure, unspecified whether with hypoxia or hypercapnia: Secondary | ICD-10-CM | POA: Diagnosis not present

## 2013-09-10 DIAGNOSIS — I2119 ST elevation (STEMI) myocardial infarction involving other coronary artery of inferior wall: Secondary | ICD-10-CM | POA: Diagnosis not present

## 2013-09-10 DIAGNOSIS — E1159 Type 2 diabetes mellitus with other circulatory complications: Secondary | ICD-10-CM

## 2013-09-10 DIAGNOSIS — Z9189 Other specified personal risk factors, not elsewhere classified: Secondary | ICD-10-CM

## 2013-09-10 DIAGNOSIS — I519 Heart disease, unspecified: Secondary | ICD-10-CM

## 2013-09-10 DIAGNOSIS — I25810 Atherosclerosis of coronary artery bypass graft(s) without angina pectoris: Secondary | ICD-10-CM | POA: Diagnosis not present

## 2013-09-10 DIAGNOSIS — I255 Ischemic cardiomyopathy: Secondary | ICD-10-CM | POA: Clinically undetermined

## 2013-09-10 DIAGNOSIS — R0902 Hypoxemia: Secondary | ICD-10-CM

## 2013-09-10 DIAGNOSIS — I25710 Atherosclerosis of autologous vein coronary artery bypass graft(s) with unstable angina pectoris: Secondary | ICD-10-CM | POA: Diagnosis present

## 2013-09-10 LAB — LIPID PANEL
Cholesterol: 206 mg/dL — ABNORMAL HIGH (ref 0–200)
HDL: 45 mg/dL (ref >39)
LDL Cholesterol: 136 mg/dL — ABNORMAL HIGH (ref 0–99)
Total CHOL/HDL Ratio: 4.6 RATIO
Triglycerides: 124 mg/dL (ref <150)
VLDL: 25 mg/dL (ref 0–40)

## 2013-09-10 LAB — GLUCOSE, CAPILLARY
GLUCOSE-CAPILLARY: 158 mg/dL — AB (ref 70–99)
GLUCOSE-CAPILLARY: 303 mg/dL — AB (ref 70–99)
GLUCOSE-CAPILLARY: 173 mg/dL — AB (ref 70–99)
GLUCOSE-CAPILLARY: 173 mg/dL — AB (ref 70–99)
Glucose-Capillary: 191 mg/dL — ABNORMAL HIGH (ref 70–99)

## 2013-09-10 LAB — CBC
HCT: 39.6 % (ref 36.0–46.0)
Hemoglobin: 12.6 g/dL (ref 12.0–15.0)
MCH: 28.6 pg (ref 26.0–34.0)
MCHC: 31.8 g/dL (ref 30.0–36.0)
MCV: 90 fL (ref 78.0–100.0)
PLATELETS: 235 10*3/uL (ref 150–400)
RBC: 4.4 MIL/uL (ref 3.87–5.11)
RDW: 15.9 % — ABNORMAL HIGH (ref 11.5–15.5)
WBC: 13.1 10*3/uL — AB (ref 4.0–10.5)

## 2013-09-10 LAB — BASIC METABOLIC PANEL
ANION GAP: 19 — AB (ref 5–15)
Anion gap: 18 — ABNORMAL HIGH (ref 5–15)
BUN: 47 mg/dL — ABNORMAL HIGH (ref 6–23)
BUN: 45 mg/dL — ABNORMAL HIGH (ref 6–23)
CALCIUM: 9.2 mg/dL (ref 8.4–10.5)
CHLORIDE: 101 meq/L (ref 96–112)
CO2: 21 mEq/L (ref 19–32)
CO2: 22 mEq/L (ref 19–32)
CREATININE: 1.56 mg/dL — AB (ref 0.50–1.10)
Calcium: 9.2 mg/dL (ref 8.4–10.5)
Chloride: 105 mEq/L (ref 96–112)
Creatinine, Ser: 1.69 mg/dL — ABNORMAL HIGH (ref 0.50–1.10)
GFR calc Af Amer: 38 mL/min — ABNORMAL LOW (ref >90)
GFR calc non Af Amer: 30 mL/min — ABNORMAL LOW (ref >90)
GFR, EST AFRICAN AMERICAN: 35 mL/min — AB (ref >90)
GFR, EST NON AFRICAN AMERICAN: 33 mL/min — AB (ref >90)
GLUCOSE: 181 mg/dL — AB (ref 70–99)
Glucose, Bld: 330 mg/dL — ABNORMAL HIGH (ref 70–99)
POTASSIUM: 4.6 meq/L (ref 3.7–5.3)
Potassium: 5.1 mEq/L (ref 3.7–5.3)
SODIUM: 141 meq/L (ref 137–147)
SODIUM: 145 meq/L (ref 137–147)

## 2013-09-10 LAB — TROPONIN I
Troponin I: >20 ng/mL (ref <0.30)
Troponin I: >20 ng/mL (ref <0.30)
Troponin I: >20 ng/mL (ref <0.30)

## 2013-09-10 LAB — PROTIME-INR
INR: 1.16 (ref 0.00–1.49)
Prothrombin Time: 14.8 seconds (ref 11.6–15.2)

## 2013-09-10 MED ORDER — GLYBURIDE 5 MG PO TABS
5.0000 mg | ORAL_TABLET | Freq: Two times a day (BID) | ORAL | Status: DC
  Administered 2013-09-10 – 2013-09-14 (×8): 5 mg via ORAL
  Filled 2013-09-10 (×10): qty 1

## 2013-09-10 MED ORDER — LISINOPRIL 20 MG PO TABS
20.0000 mg | ORAL_TABLET | Freq: Every day | ORAL | Status: DC
  Administered 2013-09-10: 20 mg via ORAL
  Filled 2013-09-10 (×2): qty 1

## 2013-09-10 MED ORDER — PANTOPRAZOLE SODIUM 40 MG PO TBEC
40.0000 mg | DELAYED_RELEASE_TABLET | Freq: Every day | ORAL | Status: DC
  Administered 2013-09-10 – 2013-09-14 (×5): 40 mg via ORAL
  Filled 2013-09-10 (×5): qty 1

## 2013-09-10 MED ORDER — LINAGLIPTIN 5 MG PO TABS
5.0000 mg | ORAL_TABLET | Freq: Every day | ORAL | Status: DC
  Administered 2013-09-10 – 2013-09-14 (×5): 5 mg via ORAL
  Filled 2013-09-10 (×5): qty 1

## 2013-09-10 MED FILL — Sodium Chloride IV Soln 0.9%: INTRAVENOUS | Qty: 50 | Status: AC

## 2013-09-10 NOTE — Progress Notes (Signed)
Patient requested that BiPAP be removed. 100% non rebreather applied. Patient is slightly sob and O2 sat is 95% and respirations are 26. Will continue to monitor for signs of distress.

## 2013-09-10 NOTE — Progress Notes (Addendum)
Inpatient Diabetes Program Recommendations  AACE/ADA: New Consensus Statement on Inpatient Glycemic Control (2013)  Target Ranges:  Prepandial:   less than 140 mg/dL      Peak postprandial:   less than 180 mg/dL (1-2 hours)      Critically ill patients:  140 - 180 mg/dL     Results for Jo Lynn, Jo Lynn (MRN [id number]) as of 09/10/2013 11:42  Ref. Range 09/09/2013 11:45 09/09/2013 16:35 09/09/2013 21:38  Glucose-Capillary Latest Range: 70-99 mg/dL 383 (H) 376 (H) 350 (H)    Results for Jo Lynn, Jo Lynn (MRN [id number]) as of 09/10/2013 11:42  Ref. Range 09/10/2013 04:27 09/10/2013 07:46  Glucose-Capillary Latest Range: 70-99 mg/dL 191 (H) 158 (H)    Results for Jo Lynn, Jo Lynn (MRN [id number]) as of 09/10/2013 11:42  Ref. Range 09/09/2013 10:55  Hemoglobin A1C Latest Range: <5.7 % 10.8 (H)     Patient admitted with CP.  History of DM, HTN, CABG.  Home DM Meds:  Glyburide 5 mg bid Onglyza 2.5 mg daily   *Note Novolog Resistant SSI started Q4 hours yesterday at 12 noon.  CBGs have steadily come down.  **A1c shows extremely poor control at home.  Patient sees Chevis Pretty, NP with [facility].  Patient checks CBGs usually only once per week.  Still works at a job.  Patient told me she was told by her PCP that she needed to start insulin but patient stated she doesn't want to take insulin b/c she is still working.  **Spoke to patient about her current A1c of 10.8%.  Explained what an A1c is and what it measures.  Reminded patient that her goal A1c is 7% or less per ADA standards to prevent both acute and long-term complications.  Encouraged patient to check her CBGs at least once daily at home (vary the time of day she checks and record all CBGs in a logbook for her PCP to review).  Also discussed the possibility of starting insulin.  Patient again told me she doesn't want to take insulin and that she has heard "bad things" about insulin.  When asked what "bad  things" she has heard about patient could not tell me specifically what drugs or what "bad things" she had heard.  Discussed with patient that depending on what her body's needs are, there are several kinds of insulins available nowadays.  Patient stated she would take insulin if she absolutely had to, but that she really didn't want to.  **Patient needs close follow-up with her PCP in [facility] for further DM management.  Likely needs insulin at home.    Will follow. Wyn Quaker RN, MSN, CDE Diabetes Coordinator Inpatient Diabetes Program Team Pager: [phone] (8a-10p)

## 2013-09-10 NOTE — Progress Notes (Signed)
PULMONARY / CRITICAL CARE MEDICINE   Name: Charisma Charlot MRN: [id number] DOB: Sep 04, 1945    ADMISSION DATE:  09/09/2013 CONSULTATION DATE:  09/09/2013  REFERRING MD :  Ellyn Hack PRIMARY SERVICE: PCCM  CHIEF COMPLAINT:  Dyspnea  BRIEF PATIENT DESCRIPTION: 68 yo female with h/o CAD, CABG, DM. Admitted to [facility] 7/16 as code STEMI. Had PCI x 2 vessels in cath lab. Later in day with respiratory distress. PCCM to see.   SIGNIFICANT EVENTS / STUDIES:  7/16 STEMI > to cath lab - successful 2 site PCI of SVG-OM1-OM2. LVEF 20% by LVgram  LINES / TUBES: Foley 7/16 >>>  CULTURES: None  ANTIBIOTICS: N/a  SUBJECTIVE: off BiPAP now, improving with lasix.  VITAL SIGNS: Temp:  [97.3 F (36.3 C)-98.9 F (37.2 C)] 98.9 F (37.2 C) (07/17 0805) Pulse Rate:  [77-114] 93 (07/17 0700) Resp:  [13-35] 19 (07/17 0700) BP: (88-130)/(29-88) 122/67 mmHg (07/17 0700) SpO2:  [81 %-100 %] 94 % (07/17 0700) Arterial Line BP: (119-125)/(68-69) 119/68 mmHg (07/16 1300) FiO2 (%):  [60 %-100 %] 100 % (07/17 0600) HEMODYNAMICS:   VENTILATOR SETTINGS: Vent Mode:  [-] BIPAP FiO2 (%):  [60 %-100 %] 100 % Set Rate:  [8 bmp] 8 bmp INTAKE / OUTPUT: Intake/Output     07/16 0701 - 07/17 0700 07/17 0701 - 07/18 0700   P.O. 480    I.V. (mL/kg) 79.8 (0.8)    Total Intake(mL/kg) 559.8 (5.9)    Urine (mL/kg/hr) 1740 (0.8) 200 (0.5)   Total Output 1740 200   Net -1180.3 -200         PHYSICAL EXAMINATION: General: Obese female in no respiratory distress Neuro:  Alert, oriented.  HEENT:  [place]/AT no JVD observed, difficult with neck girth.  Cardiovascular: Tachy, regular Lungs:  Diffuse crackles, exp wheeze on left Abdomen:  Obese, soft, non-tender, non -distended Musculoskeletal:  No acute deformity or ROM limitation.  Skin:  Intact  LABS:  CBC  Recent Labs Lab 09/09/13 0700 09/09/13 1055 09/10/13 0105  WBC 8.8 14.2* 13.1*  HGB 10.0* 12.0 12.6  HCT 31.1* 36.7 39.6  PLT 239 270 235    Coag's  Recent Labs Lab 09/09/13 0550 09/09/13 1055 09/10/13 0105  APTT 24 45*  --   INR 1.02 1.33 1.16   BMET  Recent Labs Lab 09/09/13 0550 09/09/13 1055 09/10/13 0105 09/10/13 0744  NA 141  --  141 145  K 4.8  --  4.6 5.1  CL 100  --  101 105  CO2 17*  --  21 22  BUN 36*  --  47* 45*  CREATININE 1.16* 1.24* 1.69* 1.56*  GLUCOSE 391*  --  330* 181*   Electrolytes  Recent Labs Lab 09/09/13 0550 09/09/13 1055 09/10/13 0105 09/10/13 0744  CALCIUM 9.5  --  9.2 9.2  MG  --  1.7  --   --    Sepsis Markers No results found for this basename: LATICACIDVEN, PROCALCITON, O2SATVEN,  in the last 168 hours ABG No results found for this basename: PHART, PCO2ART, PO2ART,  in the last 168 hours Liver Enzymes  Recent Labs Lab 09/09/13 0550  AST 23  ALT 19  ALKPHOS 86  BILITOT 0.5  ALBUMIN 3.9   Cardiac Enzymes  Recent Labs Lab 09/09/13 1055 09/09/13 1848 09/10/13 0105 09/10/13 0744  TROPONINI >20.00* >20.00* >20.00* >20.00*  PROBNP 2561.0*  --   --   --    Glucose  Recent Labs Lab 09/09/13 1145 09/09/13 1635 09/09/13 2138 09/10/13 0427  GLUCAP 383* 376* 350* 191*    Imaging Dg Chest Port 1 View  09/10/2013   CLINICAL DATA:  Evaluate edema  EXAM: PORTABLE CHEST - 1 VIEW  COMPARISON:  09/09/2013  FINDINGS: Unchanged cardiomegaly. Upper mediastinal contours are stable. Noted changes of CABG.  Bilateral airspace disease is again seen, persistent on the right but with significant clearing on the left. Although the asymmetry is concerning for pneumonia, noted history of myocardial infarction with stenting. No evidence of effusion or pneumothorax.  IMPRESSION: Improving pulmonary edema, especially on the left. Alveolar opacification remains extensive however.   Electronically Signed   By: Jorje Guild M.D.   On: 09/10/2013 06:36   Dg Chest Port 1 View  09/09/2013   CLINICAL DATA:  Dyspnea  EXAM: PORTABLE CHEST - 1 VIEW  COMPARISON:  None.  FINDINGS:  Borderline enlarged cardiac silhouette and mediastinal contours post median sternotomy and CABG. The pulmonary vasculature is indistinct with extensive bilateral though perihilar predominant heterogeneous airspace opacities. Trace bilateral effusions are not excluded. No pneumothorax. Unchanged bones.  IMPRESSION: Findings most worrisome for alveolar pulmonary edema, though note, underlying infection is not excluded.   Electronically Signed   By: Sandi Mariscal M.D.   On: 09/09/2013 20:26     ASSESSMENT / PLAN:  PULMONARY A: Acute respiratory failure likely due to pulmonary edema Pulmonary Edema  P:   - D/C BiPAP - Diuresis per primary team (Lasix 20mg  BID) - F/u CXR  CARDIOVASCULAR A:  S/p PCI for STEMI Acute systolic CHF LVEF 41% CAD  P:  - Management per cardiology - Off pressors, BP stable. - Continue metoprolol and lisinopril   RENAL A:   CKD baseline creat 1.4  P:   - Monitor BMP - KVO IVF  GASTROINTESTINAL A:   GI ppx  P:   - PPI - Start diet.  HEMATOLOGIC A:   Anemia  P:  - Follow cbc  INFECTIOUS A:   No acute evidence of infection  P:   - Monitor clinically  ENDOCRINE A:  DM  P:   - CBG checks and SSI while NPO  NEUROLOGIC A:   Lethargy - greatly improved on BiPAP  P:   - Monitor  PCCM will sign off, please call back if needed.  I have personally obtained a history, examined the patient, evaluated laboratory and imaging results, formulated the assessment and plan and placed orders.  Rush Farmer, M.D. [facility] Pulmonary/Critical Care Medicine. Pager: [phone]. After hours pager: [phone].

## 2013-09-10 NOTE — Progress Notes (Signed)
  Echocardiogram 2D Echocardiogram has been performed.  Mauricio Po 09/10/2013, 9:42 AM

## 2013-09-10 NOTE — Progress Notes (Signed)
Pt. Demanded to come off bipap per RN. RN placed pt. On NRB. Pt. Tolerating at this time.

## 2013-09-10 NOTE — CV Procedure (Addendum)
CARDIAC CATHETERIZATION AND PERCUTANEOUS CORONARY INTERVENTION REPORT  NAME:  Jo Lynn   MRN: [id number] DOB:  26-Nov-1945   ADMIT DATE: 09/09/2013 Procedure Date: 09/10/2013  INTERVENTIONAL CARDIOLOGIST: Leonie Man, M.D., MS PRIMARY CARE PROVIDER: Redge Gainer, MD PRIMARY CARDIOLOGIST:  No Active Cardiologist - New to [facility]  PATIENT:  Jo Lynn is a 68 y.o. female history of DM, HTN, dyslipidemia and ASCAD s/p remote CABG (LIMA-LAD, SVG-OM1-OM2, SVG-PDA) in 2006 who presents with chest pain. She is a poor historian and could not tell me who her Cardiologist is or where she had her CABG. She awakened about 4:45am with chest tightness and severe back pain. She called EMS and was found by EKG to have acute inferior ST elevation c/w STEMI. She was transferred to [facility] by [place]. Currently she complains of severe back pain but CP is better. She cannot given any other history due to severe pain.  Upon arrival to [facility] ER with Code STEMI activated by EMS, the cath lab team & MD (Dr. Ellyn Hack) were actively involved with a previous STEMI patient who was being referred for urgent CABG.  This system delay led to a delay of ~35 minutes while in the ER waiting to be brought to the Cath Lab.  PRE-OPERATIVE DIAGNOSIS:    Extensive Inferolateral STEMI   Known CAD-CABG  PROCEDURES PERFORMED:    Left Heart Catheterization with Native Coronary and Graft Angiography  via Right Common Femoral Artery   Left Ventriculography  Successful PCI revascularization of a 100% occluded SVG-OM1-2 with downstream ~90% lesion in the sequential limb using Boston Sci Rebel BMS 4.0 mm stents (16 mm in initial limb at 100% stenosis & 12 mm at sequential limb lesion)  Unsuccessful attempt at [facility] on proximal 100% SVG-RCA  PROCEDURE: The patient was brought to the 2nd [place] [facility] Catheter[facility] Lab in the fasting state and prepped and draped in the usual sterile fashion for  Right Common artery access. Sterile technique was used including antiseptics, cap, gloves, gown, hand hygiene, mask and sheet. Skin prep: Chlorhexidine.   Consent: Risks of procedure as well as the alternatives and risks of each were explained to the (patient/caregiver). Consent for procedure obtained.   Time Out: Verified patient identification, verified procedure, site/side was marked, verified correct patient position, special equipment/implants available, medications/allergies/relevent history reviewed, required imaging and test results available. Performed.  Access:   Right Common Femoral Artery: 6 Fr Sheath -  fluoroscopically guided modified Seldinger Technique (Angiocath Micropuncture Kit)  Left Heart Catheterization /Angiography: 5Fr Catheters advanced or exchanged over a standard J-wire; JL4 catheter advanced first.  Left Coronary Artery Cineangiography: JL4 Catheter  Right Coronary Artery, SVG-RCA & SVG-OM1-2 Cineangiography: JR4 Catheter   LIMA-LAD Cineangiography: IMA Catheter redirected into Left Subclavian Artery (post PCI) - non-selective angiography due to tortuous Left Subclavian Artery  LV Hemodynamics (LV Gram): Angled Pigtail - Post PCI  Sheath will be removed in CCU with manual pressure for hemostasis 2 hours after completion of Angiomax.  FINDINGS:  Hemodynamics:   Central Aortic Pressure / Mean: 99/62/78 mmHg  Left Ventricular Pressure / LVEDP: 98/18/26 mmHg  Left Ventriculography:  EF: ~20 %  Wall Motion: global hypokinesis with basal to mid inferior akinesis  Coronary Anatomy:  Dominance: Right  Left Main: normal caliber, mild calcification; bifurcates into the LAD & Circumflex LAD: 100% occluded just after the ostium  Left Circumflex: Normal caliber vessel with early mid 95% stenosis followed by occlusion beyond OM1 take-off.  OM1 is also  100% occluded.  SVG-OM1-2: Large caliber graft with 100% occlusion in the mid portion of the proximal  limb.  Post reperfusion angiography revealed a residual ~95% stenosis in the proximal limb along with a 90-95% focal stenosis just beyond the initial anastomosis in the sequential limb.  OM1 & OM2 are a moderate caliber vessels with minimal luminal irregularities   RCA: Normal caliber vessel - 100% occluded in the distal mid vessel, a RV marginal branch from the early portion of the mid RCA provides collaterals to the posterolateral system.   Right Posterior AV Groove Branch (RPAV)  SVG-RPDA: 100% occluded, proximal  After reviewing the initial angiography, the culprit lesion was thought to be the occluded sequential SVG-OM1-OM2.  Preparation were made to proceed with PCI on this lesion.  Percutaneous Coronary Intervention:     Lesion #1A: 100% occluded SVG-OM1-2, mid Proximal LIMB - reduced to 0%; TIMI 0 --> TIMI 3 flow   Guide: 6 Fr   AL1 Guidewire: Prowater Predilation Balloon: Emerge 2.5 mm x 12 mm;   10 Atm x 30 Sec, 12 Atm x 30 Sec -- Stent: Boston Sci Rebel BMS 4.0 mm x 16 mm;   Max Inflation 16 Atm x 30 Sec -- 4.2 mm  Lesion #1B 95% stenosis beyond OM1 anastomosis in the sequential limb. 95% reduced to 0%; TIMI 0 --> TIMI 3 flow    (lesion identified after initial reperfusion).   Stent: Boston Sci Rebel BMS 4.0 mm x 12 mm; The proximal stent crosses the anastomosis.  Max Inflation 16 Atm x 30 Sec - 4.2 mm  Post deployment angiography in multiple views, with and without guidewire in place revealed excellent stent deployment and lesion coverage.  There was no evidence of dissection or perforation.   Due to the patient's borderline BP and severely reduced LVEF, an attempt was made to revascularize the SVG-RCA.   Lesion #2: Proximal SVG-RCA 100% pre & post; TIMI 0 flow pre & post -- UNSUCCESSFUL, Guide: 6 Fr  AL1 Guidewire: Prowater Predilation Balloon: Emerge 2.5 mm x 12 mm;   6 Atm x 25 Sec  Despite being able to pass the guidewire beyond the initial lesion, the balloon  would not pass.  The patent portion of the graft was quite small -- this led to the conclusion that this occlusion was most likely not acute; The attempt was abandoned.  MEDICATIONS:  Anesthesia:  Local Lidocaine 14 ml  Sedation:  1 mg IV Versed, 50 mcg IV fentanyl ;   Premedication: 4000 units IV Heparin, 324 mg ASA  Omnipaque Contrast: 155 ml  Anticoagulation:  Angiomax Bolus & drip  Anti-Platelet Agent:  Brilinta 180 mg  IV Lasix 40 mg  High-flow O2 by NRB mask.  PATIENT DISPOSITION:    The patient was transferred to the PACU holding area in a hemodynamicaly stable, chest pain free condition.  The patient tolerated the procedure well, and there were no complications.  EBL:   < 10 ml  The patient was stable before, during, and after the procedure.  POST-OPERATIVE DIAGNOSIS:    Severe Native CAD with LAD, Circumflex (OM1 & 2) and RCA 100% occluded.  100% Acute occlusion of the proximal limb of SVG-OM1-OM2 with a second 95% just beyond the anastomosis in the sequential limb.   Successful 2 Site PCI with Rebel BMS 4.0 mm stents  Patent LIMA LAD, but 100% subacute to chronic occlusion of SVG-RCA.  Severe Ischemic Cardiomyopathy with EF ~20%  PLAN OF CARE:  Admit to ICU -  not a Fast Track patient   Confirm EF with Echocardiogram - would consider LifeVest prior to d/c.  Hold ACE-I initially, but continue BB due to tachycardia;   Will need IV diuresis & titration of CHF medications DAPT x at least 1 month, but would continue x 1 yr.   Leonie Man, M.D., M.S. [facility] [address] [place], [place]  [address]  [phone] 09/09/2013 8:15 AM

## 2013-09-10 NOTE — Progress Notes (Signed)
Patient ID: Jo Lynn, female   DOB: 03/27/1945, 68 y.o.   MRN: [id number]    Subjective:  Severe ischemic DCM post CABG  EF 20%  MI with BMS to SVG OM and aborted attempt at SVG to PDA ? Chronically occluded Post procedure pulmonary edema requiring Bipap  Breathing better off Bipap and just on [place] with sats 97%  Good diuresis  Nausea wants to eat  Objective:  Filed Vitals:   09/10/13 0419 09/10/13 0500 09/10/13 0600 09/10/13 0609  BP:  129/65 124/60   Pulse:  91 97 91  Temp: 98.4 F (36.9 C)     TempSrc: Axillary     Resp:  35 27 26  Height:      Weight:      SpO2:  100% 97% 92%    Intake/Output from previous day:  Intake/Output Summary (Last 24 hours) at 09/10/13 0729 Last data filed at 09/10/13 0600  Gross per 24 hour  Intake 559.75 ml  Output   1740 ml  Net -1180.25 ml    Physical Exam:  Affect appropriate Obese black female HEENT: normal Neck supple with no adenopathy JVP normal no bruits no thyromegaly Lungs basilar rales no wheezing and good diaphragmatic motion Heart:  S1/S2 no murmur, no rub, gallop or click  Previous sternotomy PMI normal Abdomen: benighn, BS positve, no tenderness, no AAA no bruit.  No HSM or HJR Distal pulses intact with no bruits No edema Neuro non-focal Skin warm and dry No muscular weakness   Lab Results: Basic Metabolic Panel:  Recent Labs  09/09/13 0550 09/09/13 1055 09/10/13 0105  NA 141  --  141  K 4.8  --  4.6  CL 100  --  101  CO2 17*  --  21  GLUCOSE 391*  --  330*  BUN 36*  --  47*  CREATININE 1.16* 1.24* 1.69*  CALCIUM 9.5  --  9.2  MG  --  1.7  --    Liver Function Tests:  Recent Labs  09/09/13 0550  AST 23  ALT 19  ALKPHOS 86  BILITOT 0.5  PROT 7.8  ALBUMIN 3.9   CBC:  Recent Labs  09/09/13 0700 09/09/13 1055 09/10/13 0105  WBC 8.8 14.2* 13.1*  NEUTROABS  --  13.2*  --   HGB 10.0* 12.0 12.6  HCT 31.1* 36.7 39.6  MCV 87.9 87.6 90.0  PLT 239 270 235   Cardiac Enzymes:  Recent  Labs  09/09/13 1055 09/09/13 1848 09/10/13 0105  TROPONINI >20.00* >20.00* >20.00*   Hemoglobin A1C:  Recent Labs  09/09/13 1055  HGBA1C 10.8*   Fasting Lipid Panel:  Recent Labs  09/10/13 0105  CHOL 206*  HDL 45  LDLCALC 136*  TRIG 124  CHOLHDL 4.6   Thyroid Function Tests:  Recent Labs  09/09/13 1055  TSH 0.072*    Imaging: Dg Chest Port 1 View  09/10/2013   CLINICAL DATA:  Evaluate edema  EXAM: PORTABLE CHEST - 1 VIEW  COMPARISON:  09/09/2013  FINDINGS: Unchanged cardiomegaly. Upper mediastinal contours are stable. Noted changes of CABG.  Bilateral airspace disease is again seen, persistent on the right but with significant clearing on the left. Although the asymmetry is concerning for pneumonia, noted history of myocardial infarction with stenting. No evidence of effusion or pneumothorax.  IMPRESSION: Improving pulmonary edema, especially on the left. Alveolar opacification remains extensive however.   Electronically Signed   By: Jorje Guild M.D.   On: 09/10/2013 06:36   Dg  Chest Port 1 View  09/09/2013   CLINICAL DATA:  Dyspnea  EXAM: PORTABLE CHEST - 1 VIEW  COMPARISON:  None.  FINDINGS: Borderline enlarged cardiac silhouette and mediastinal contours post median sternotomy and CABG. The pulmonary vasculature is indistinct with extensive bilateral though perihilar predominant heterogeneous airspace opacities. Trace bilateral effusions are not excluded. No pneumothorax. Unchanged bones.  IMPRESSION: Findings most worrisome for alveolar pulmonary edema, though note, underlying infection is not excluded.   Electronically Signed   By: Sandi Mariscal M.D.   On: 09/09/2013 20:26    Cardiac Studies:  ECG:  SR PVC  Inferolateral MI   Telemetry:  SR ST  Occasional PVC no VT   Echo:  Ordered   Medications:   . antiseptic oral rinse  15 mL Mouth Rinse TID  . aspirin EC  81 mg Oral Daily  . atorvastatin  80 mg Oral q1800  . furosemide  20 mg Intravenous BID  . heparin   5,000 Units Subcutaneous 3 times per day  . insulin aspart  0-20 Units Subcutaneous 6 times per day  . metoprolol  50 mg Oral BID  . pantoprazole (PROTONIX) IV  40 mg Intravenous Daily  . ticagrelor  90 mg Oral BID     . sodium chloride Stopped (09/09/13 1200)    Assessment/Plan:  MI:  Post BMS to SVG OM  DAT for at least 3 months   CHF:  Improved  Resume home ACE dose but watch Cr carefully  Good diuresis continue bid iv lasix for now  CXR with improved pulmonary edema.   Chol:  On statin TSH .072  Check free T4 and T3  On beta blocker  She is DNR and hopefully has turned the corner with regard to pulmonary edema  Start home oral hypoglycemic in am cover with SS insulin now   Jenkins Rouge 09/10/2013, 7:29 AM

## 2013-09-11 DIAGNOSIS — I25810 Atherosclerosis of coronary artery bypass graft(s) without angina pectoris: Secondary | ICD-10-CM

## 2013-09-11 DIAGNOSIS — I200 Unstable angina: Secondary | ICD-10-CM

## 2013-09-11 DIAGNOSIS — I5043 Acute on chronic combined systolic (congestive) and diastolic (congestive) heart failure: Secondary | ICD-10-CM

## 2013-09-11 DIAGNOSIS — I2119 ST elevation (STEMI) myocardial infarction involving other coronary artery of inferior wall: Secondary | ICD-10-CM | POA: Diagnosis not present

## 2013-09-11 DIAGNOSIS — E785 Hyperlipidemia, unspecified: Secondary | ICD-10-CM

## 2013-09-11 DIAGNOSIS — J9600 Acute respiratory failure, unspecified whether with hypoxia or hypercapnia: Secondary | ICD-10-CM | POA: Diagnosis not present

## 2013-09-11 LAB — PRO B NATRIURETIC PEPTIDE: Pro B Natriuretic peptide (BNP): 9429 pg/mL — ABNORMAL HIGH (ref 0–125)

## 2013-09-11 LAB — URINALYSIS, ROUTINE W REFLEX MICROSCOPIC
Bilirubin Urine: NEGATIVE
GLUCOSE, UA: NEGATIVE mg/dL
Ketones, ur: NEGATIVE mg/dL
Nitrite: NEGATIVE
PROTEIN: NEGATIVE mg/dL
Specific Gravity, Urine: 1.014 (ref 1.005–1.030)
Urobilinogen, UA: 0.2 mg/dL (ref 0.0–1.0)
pH: 5 (ref 5.0–8.0)

## 2013-09-11 LAB — T4, FREE: Free T4: 1.19 ng/dL (ref 0.80–1.80)

## 2013-09-11 LAB — GLUCOSE, CAPILLARY
GLUCOSE-CAPILLARY: 121 mg/dL — AB (ref 70–99)
GLUCOSE-CAPILLARY: 244 mg/dL — AB (ref 70–99)
GLUCOSE-CAPILLARY: 270 mg/dL — AB (ref 70–99)
Glucose-Capillary: 110 mg/dL — ABNORMAL HIGH (ref 70–99)
Glucose-Capillary: 117 mg/dL — ABNORMAL HIGH (ref 70–99)
Glucose-Capillary: 311 mg/dL — ABNORMAL HIGH (ref 70–99)
Glucose-Capillary: 205 mg/dL — ABNORMAL HIGH (ref 70–99)

## 2013-09-11 LAB — TROPONIN I: Troponin I: >20 ng/mL (ref <0.30)

## 2013-09-11 LAB — T3, FREE: T3 FREE: 2.1 pg/mL — AB (ref 2.3–4.2)

## 2013-09-11 LAB — URINE MICROSCOPIC-ADD ON

## 2013-09-11 MED ORDER — INSULIN ASPART 100 UNIT/ML ~~LOC~~ SOLN
0.0000 [IU] | Freq: Three times a day (TID) | SUBCUTANEOUS | Status: DC
  Administered 2013-09-12: 20 [IU] via SUBCUTANEOUS
  Administered 2013-09-13: 4 [IU] via SUBCUTANEOUS
  Administered 2013-09-13: 7 [IU] via SUBCUTANEOUS
  Administered 2013-09-14: 4 [IU] via SUBCUTANEOUS
  Administered 2013-09-14: 7 [IU] via SUBCUTANEOUS

## 2013-09-11 MED ORDER — METOPROLOL TARTRATE 50 MG PO TABS
50.0000 mg | ORAL_TABLET | Freq: Three times a day (TID) | ORAL | Status: DC
  Administered 2013-09-12 (×2): 50 mg via ORAL
  Filled 2013-09-11 (×9): qty 1

## 2013-09-11 MED ORDER — GUAIFENESIN-DM 100-10 MG/5ML PO SYRP
5.0000 mL | ORAL_SOLUTION | ORAL | Status: DC | PRN
  Administered 2013-09-11 – 2013-09-13 (×5): 5 mL via ORAL
  Filled 2013-09-11 (×5): qty 5

## 2013-09-11 MED ORDER — LISINOPRIL 10 MG PO TABS: 10.0000 mg | ORAL_TABLET | Freq: Every day | ORAL | Status: DC

## 2013-09-11 MED ORDER — BISACODYL 5 MG PO TBEC: 5.0000 mg | DELAYED_RELEASE_TABLET | Freq: Every day | ORAL | Status: DC | PRN

## 2013-09-11 MED ORDER — LISINOPRIL 5 MG PO TABS
5.0000 mg | ORAL_TABLET | Freq: Every day | ORAL | Status: DC
  Administered 2013-09-11: 5 mg via ORAL
  Filled 2013-09-11 (×2): qty 1

## 2013-09-11 MED ORDER — METOPROLOL TARTRATE 50 MG PO TABS: 50.0000 mg | ORAL_TABLET | Freq: Three times a day (TID) | ORAL | Status: DC

## 2013-09-11 NOTE — Progress Notes (Signed)
Subjective: Denies CP  Breathing is OK  SOme soreness in RLQ and groin  NO BM for a few days.   Objective: Filed Vitals:   09/11/13 0415 09/11/13 0500 09/11/13 0600 09/11/13 0700  BP: 94/48 81/54 102/47 100/24  Pulse: 91 94  86  Temp:      TempSrc:      Resp: 17 24 25 20   Height:      Weight:      SpO2: 97% 97% 99% 99%   Weight change:   Intake/Output Summary (Last 24 hours) at 09/11/13 0725 Last data filed at 09/11/13 0400  Gross per 24 hour  Intake    560 ml  Output   1325 ml  Net   -765 ml   I/O  Neg - 1.9 L   General: Alert, awake, oriented x3, in no acute distress Neck:  JVP is normal Heart: Regular rate and rhythm, without murmurs, rubs, gallops.  Lungs: Clear to auscultation.  No rales or wheezes. Exemities:  No edema.   Neuro: Grossly intact, nonfocal.  Tel:  SR   Lab Results: Results for orders placed during the hospital encounter of 09/09/13 (from the past 24 hour(s))  TROPONIN I     Status: Abnormal   Collection Time    09/10/13  7:44 AM      Result Value Ref Range   Troponin I >20.00 (*) <0.30 ng/mL  BASIC METABOLIC PANEL     Status: Abnormal   Collection Time    09/10/13  7:44 AM      Result Value Ref Range   Sodium 145  137 - 147 mEq/L   Potassium 5.1  3.7 - 5.3 mEq/L   Chloride 105  96 - 112 mEq/L   CO2 22  19 - 32 mEq/L   Glucose, Bld 181 (*) 70 - 99 mg/dL   BUN 45 (*) 6 - 23 mg/dL   Creatinine, Ser 1.56 (*) 0.50 - 1.10 mg/dL   Calcium 9.2  8.4 - 10.5 mg/dL   GFR calc non Af Amer 33 (*) >90 mL/min   GFR calc Af Amer 38 (*) >90 mL/min   Anion gap 18 (*) 5 - 15  GLUCOSE, CAPILLARY     Status: Abnormal   Collection Time    09/10/13  7:46 AM      Result Value Ref Range   Glucose-Capillary 158 (*) 70 - 99 mg/dL   Comment 1 Documented in Chart     Comment 2 Notify RN    GLUCOSE, CAPILLARY     Status: Abnormal   Collection Time    09/10/13 12:00 PM      Result Value Ref Range   Glucose-Capillary 303 (*) 70 - 99 mg/dL  TROPONIN I     Status:  Abnormal   Collection Time    09/10/13  4:35 PM      Result Value Ref Range   Troponin I >20.00 (*) <0.30 ng/mL  GLUCOSE, CAPILLARY     Status: Abnormal   Collection Time    09/10/13  5:15 PM      Result Value Ref Range   Glucose-Capillary 173 (*) 70 - 99 mg/dL  GLUCOSE, CAPILLARY     Status: Abnormal   Collection Time    09/10/13  9:14 PM      Result Value Ref Range   Glucose-Capillary 173 (*) 70 - 99 mg/dL  TROPONIN I     Status: Abnormal   Collection Time    09/10/13 10:05 PM  Result Value Ref Range   Troponin I >20.00 (*) <0.30 ng/mL  GLUCOSE, CAPILLARY     Status: Abnormal   Collection Time    09/11/13 12:04 AM      Result Value Ref Range   Glucose-Capillary 121 (*) 70 - 99 mg/dL  TROPONIN I     Status: Abnormal   Collection Time    09/11/13  3:00 AM      Result Value Ref Range   Troponin I >20.00 (*) <0.30 ng/mL  GLUCOSE, CAPILLARY     Status: Abnormal   Collection Time    09/11/13  4:34 AM      Result Value Ref Range   Glucose-Capillary 110 (*) 70 - 99 mg/dL    Studies/Results: Dg Chest Port 1 View  09/10/2013   CLINICAL DATA:  Evaluate edema  EXAM: PORTABLE CHEST - 1 VIEW  COMPARISON:  09/09/2013  FINDINGS: Unchanged cardiomegaly. Upper mediastinal contours are stable. Noted changes of CABG.  Bilateral airspace disease is again seen, persistent on the right but with significant clearing on the left. Although the asymmetry is concerning for pneumonia, noted history of myocardial infarction with stenting. No evidence of effusion or pneumothorax.  IMPRESSION: Improving pulmonary edema, especially on the left. Alveolar opacification remains extensive however.   Electronically Signed   By: Jorje Guild M.D.   On: 09/10/2013 06:36    Medications:  reviewed   @PROBHOSP @  1.CAD.  S/p MI with BMS to SVG to OM;  Aborted attempt to SVG to PDA  Post procedure pulm edema  DAT for at least 3 mon.  Continue medial Rx    2.  ICM  LVEF 20  Volume status is not too bad.   COntiue to wean O2  3.  HL  ON lipitor 80 now.  4.  Thyroid.  WIll check free T3, T4.   DNR.  LOS: 2 days   Dorris Carnes 09/11/2013, 7:25 AM

## 2013-09-11 NOTE — Progress Notes (Signed)
CARDIAC REHAB PHASE I   PRE:  Rate/Rhythm: 94 SR  BP:  Sitting: 113/33      SaO2: 96 RA  MODE:  Ambulation: 350 ft   POST:  Rate/Rhythm: 110 ST  BP:  Sitting: 128/64     SaO2: 95 RA 1400-1445 Patient ambulated in hallway independently x 1 assist. Steady gait noted. Standing rest break x2 for general weakness. Patient denied c/o CP or SOB. Post ambulation, patient back to chair with call bell and phone in reach. Family and patient requested education material be given. Encouraged patient to read over information and write down any questions she had. Education to be done at discharge possibly on Monday.  Jo Lynn, BSN 09/11/2013 2:47 PM

## 2013-09-12 DIAGNOSIS — I10 Essential (primary) hypertension: Secondary | ICD-10-CM

## 2013-09-12 LAB — GLUCOSE, CAPILLARY
Glucose-Capillary: 120 mg/dL — ABNORMAL HIGH (ref 70–99)
Glucose-Capillary: 264 mg/dL — ABNORMAL HIGH (ref 70–99)
Glucose-Capillary: 360 mg/dL — ABNORMAL HIGH (ref 70–99)
Glucose-Capillary: 147 mg/dL — ABNORMAL HIGH (ref 70–99)

## 2013-09-12 LAB — BASIC METABOLIC PANEL
Anion gap: 19 — ABNORMAL HIGH (ref 5–15)
BUN: 56 mg/dL — AB (ref 6–23)
CHLORIDE: 101 meq/L (ref 96–112)
CO2: 21 meq/L (ref 19–32)
CREATININE: 1.83 mg/dL — AB (ref 0.50–1.10)
Calcium: 8.5 mg/dL (ref 8.4–10.5)
GFR calc Af Amer: 31 mL/min — ABNORMAL LOW (ref >90)
GFR calc non Af Amer: 27 mL/min — ABNORMAL LOW (ref >90)
Glucose, Bld: 264 mg/dL — ABNORMAL HIGH (ref 70–99)
Potassium: 4.2 mEq/L (ref 3.7–5.3)
Sodium: 141 mEq/L (ref 137–147)

## 2013-09-12 LAB — TROPONIN I
Troponin I: >20 ng/mL (ref <0.30)
Troponin I: >20 ng/mL (ref <0.30)
Troponin I: >20 ng/mL (ref <0.30)
Troponin I: >20 ng/mL (ref <0.30)

## 2013-09-12 MED ORDER — FUROSEMIDE 10 MG/ML IJ SOLN
40.0000 mg | Freq: Two times a day (BID) | INTRAMUSCULAR | Status: DC
  Administered 2013-09-12 – 2013-09-13 (×4): 40 mg via INTRAVENOUS
  Filled 2013-09-12 (×4): qty 4

## 2013-09-12 MED ORDER — LISINOPRIL 2.5 MG PO TABS
2.5000 mg | ORAL_TABLET | Freq: Every day | ORAL | Status: DC
  Administered 2013-09-12 – 2013-09-14 (×2): 2.5 mg via ORAL
  Filled 2013-09-12 (×3): qty 1

## 2013-09-12 NOTE — Progress Notes (Signed)
Subjective: No CP  "I feel like I am getting a cold.  My nose is stuffed" Objective: Filed Vitals:   09/12/13 0400 09/12/13 0500 09/12/13 0600 09/12/13 0700  BP: 104/61 94/46 100/47 91/44  Pulse: 81 87 80 79  Temp: 98.5 F (36.9 C)     TempSrc: Oral     Resp:      Height:      Weight: 194 lb 3.6 oz (88.1 kg)     SpO2: 99% 97% 100% 99%   Weight change:   Intake/Output Summary (Last 24 hours) at 09/12/13 0723 Last data filed at 09/12/13 0500  Gross per 24 hour  Intake    680 ml  Output   1600 ml  Net   -920 ml   Net negative 2.86 L  General: Alert, awake, oriented x3, in no acute distress Neck:  JVP is normal Heart: Regular rate and rhythm, without murmurs, rubs, gallops.  Lungs: Clear to auscultation.  No rales or wheezes. Exemities:  No edema.   Neuro: Grossly intact, nonfocal.  Tele:  SR Lab Results: Results for orders placed during the hospital encounter of 09/09/13 (from the past 24 hour(s))  GLUCOSE, CAPILLARY     Status: Abnormal   Collection Time    09/11/13  8:09 AM      Result Value Ref Range   Glucose-Capillary 117 (*) 70 - 99 mg/dL  TROPONIN I     Status: Abnormal   Collection Time    09/11/13  8:50 AM      Result Value Ref Range   Troponin I >20.00 (*) <0.30 ng/mL  PRO B NATRIURETIC PEPTIDE     Status: Abnormal   Collection Time    09/11/13  8:50 AM      Result Value Ref Range   Pro B Natriuretic peptide (BNP) 9429.0 (*) 0 - 125 pg/mL  T4, FREE     Status: None   Collection Time    09/11/13  9:00 AM      Result Value Ref Range   Free T4 1.19  0.80 - 1.80 ng/dL  T3, FREE     Status: Abnormal   Collection Time    09/11/13  9:00 AM      Result Value Ref Range   T3, Free 2.1 (*) 2.3 - 4.2 pg/mL  URINALYSIS, ROUTINE W REFLEX MICROSCOPIC     Status: Abnormal   Collection Time    09/11/13 11:13 AM      Result Value Ref Range   Color, Urine YELLOW  YELLOW   APPearance CLOUDY (*) CLEAR   Specific Gravity, Urine 1.014  1.005 - 1.030   pH 5.0  5.0 -  8.0   Glucose, UA NEGATIVE  NEGATIVE mg/dL   Hgb urine dipstick LARGE (*) NEGATIVE   Bilirubin Urine NEGATIVE  NEGATIVE   Ketones, ur NEGATIVE  NEGATIVE mg/dL   Protein, ur NEGATIVE  NEGATIVE mg/dL   Urobilinogen, UA 0.2  0.0 - 1.0 mg/dL   Nitrite NEGATIVE  NEGATIVE   Leukocytes, UA LARGE (*) NEGATIVE  URINE MICROSCOPIC-ADD ON     Status: Abnormal   Collection Time    09/11/13 11:13 AM      Result Value Ref Range   Squamous Epithelial / LPF RARE  RARE   WBC, UA 21-50  <3 WBC/hpf   RBC / HPF 7-10  <3 RBC/hpf   Bacteria, UA MANY (*) RARE  GLUCOSE, CAPILLARY     Status: Abnormal   Collection Time  09/11/13 12:03 PM      Result Value Ref Range   Glucose-Capillary 244 (*) 70 - 99 mg/dL  TROPONIN I     Status: Abnormal   Collection Time    09/11/13  2:44 PM      Result Value Ref Range   Troponin I >20.00 (*) <0.30 ng/mL  GLUCOSE, CAPILLARY     Status: Abnormal   Collection Time    09/11/13  3:57 PM      Result Value Ref Range   Glucose-Capillary 311 (*) 70 - 99 mg/dL  GLUCOSE, CAPILLARY     Status: Abnormal   Collection Time    09/11/13  8:15 PM      Result Value Ref Range   Glucose-Capillary 270 (*) 70 - 99 mg/dL  TROPONIN I     Status: Abnormal   Collection Time    09/11/13  9:05 PM      Result Value Ref Range   Troponin I >20.00 (*) <0.30 ng/mL  GLUCOSE, CAPILLARY     Status: Abnormal   Collection Time    09/11/13  9:37 PM      Result Value Ref Range   Glucose-Capillary 205 (*) 70 - 99 mg/dL  TROPONIN I     Status: Abnormal   Collection Time    09/12/13  2:10 AM      Result Value Ref Range   Troponin I >20.00 (*) <0.30 ng/mL    Studies/Results: No results found.  Medications: {Reviewed   @PROBHOSP @ 1.CAD. S/p MI with BMS to SVG to OM; Aborted attempt to SVG to PDA Post procedure pulm edema  REcomm:  DAT for at least 3 mon. Continue medial Rx  Continue lopressor 50 tid for now  Follow BP  Decrease lisinopril to 2.5    2. ICM LVEF 20%  Will continue  diuresis  BNP signif elevated.   3. HL ON lipitor 80 now.   4. Thyroid. T3, T4 are OK     Tx to floor    LOS: 3 days   Dorris Carnes 09/12/2013, 7:23 AM

## 2013-09-12 NOTE — Progress Notes (Signed)
Patient ambulated in hallway 500 feet x1 assist. Tolerated well, denies pain, discomfort sob Alfonzo Feller

## 2013-09-13 LAB — GLUCOSE, CAPILLARY
GLUCOSE-CAPILLARY: 227 mg/dL — AB (ref 70–99)
Glucose-Capillary: 78 mg/dL (ref 70–99)
Glucose-Capillary: 219 mg/dL — ABNORMAL HIGH (ref 70–99)
Glucose-Capillary: 198 mg/dL — ABNORMAL HIGH (ref 70–99)

## 2013-09-13 LAB — BASIC METABOLIC PANEL
ANION GAP: 17 — AB (ref 5–15)
BUN: 57 mg/dL — ABNORMAL HIGH (ref 6–23)
CHLORIDE: 102 meq/L (ref 96–112)
CO2: 24 mEq/L (ref 19–32)
CREATININE: 1.81 mg/dL — AB (ref 0.50–1.10)
Calcium: 8.4 mg/dL (ref 8.4–10.5)
GFR, EST AFRICAN AMERICAN: 32 mL/min — AB (ref >90)
GFR, EST NON AFRICAN AMERICAN: 27 mL/min — AB (ref >90)
Glucose, Bld: 185 mg/dL — ABNORMAL HIGH (ref 70–99)
Potassium: 3.7 mEq/L (ref 3.7–5.3)
Sodium: 143 mEq/L (ref 137–147)

## 2013-09-13 LAB — TROPONIN I
Troponin I: >20 ng/mL (ref <0.30)
Troponin I: 18.47 ng/mL (ref <0.30)
Troponin I: 13.62 ng/mL (ref <0.30)

## 2013-09-13 LAB — PRO B NATRIURETIC PEPTIDE: Pro B Natriuretic peptide (BNP): 2838 pg/mL — ABNORMAL HIGH (ref 0–125)

## 2013-09-13 MED ORDER — METOPROLOL TARTRATE 25 MG PO TABS
25.0000 mg | ORAL_TABLET | Freq: Three times a day (TID) | ORAL | Status: DC
  Administered 2013-09-13 – 2013-09-14 (×5): 25 mg via ORAL
  Filled 2013-09-13 (×6): qty 1

## 2013-09-13 MED ORDER — FUROSEMIDE 40 MG PO TABS
40.0000 mg | ORAL_TABLET | Freq: Every day | ORAL | Status: DC
  Administered 2013-09-14: 40 mg via ORAL
  Filled 2013-09-13: qty 1

## 2013-09-13 NOTE — Progress Notes (Signed)
CARDIAC REHAB PHASE I   PRE:  Rate/Rhythm: 78 SR  BP:  Supine:   Sitting: 106/70  Standing:    SaO2: 98%RA  MODE:  Ambulation: 510 ft   POST:  Rate/Rhythm: 87 SR  BP:  Supine:   Sitting: 122/60  Standing:    SaO2: 99%RA 1400-1432 Pt walked 510 ft on RA with hand held asst. Gait. Steady. C/o feeling a little stuffy in nose and a little SOB but sats good on RA. To recliner after walk. Reviewed MI restriction, NTG use, stent/brilinta, CHF zone of when to call MD. Encouraged pt to read CHF zones and low sodium handouts. Will follow up ed tomorrow.   Graylon Good, RN BSN  09/13/2013 2:28 PM

## 2013-09-13 NOTE — Progress Notes (Signed)
Patient Name: Jo Lynn Date of Encounter: 09/13/2013     Principal Problem:   ST elevation myocardial infarction (STEMI) of inferolateral wall Active Problems:   Essential hypertension   Diabetes mellitus type 2 with complications   Hypoxemia   Acute respiratory failure - secondary to acute pulmonary edema related to Acute on Chronic Combined CHF - STEMI   Acute on chronic combined systolic and diastolic CHF (congestive heart failure) - in setting of recent Large Inferolateral STEMI with existing RCA/SVG-RCA occlusion   Cardiomyopathy, ischemic - EF ~20-25%; Large MI, existing occluded RCA & SVG-RCA; h/o CABG   At risk for sudden cardiac death   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 States she had arm pain, back pain, CP and SOB with original MI years ago. This time only arm pain and back pain. Her SOB has improved. Her arm is no longer painful  CURRENT MEDS . antiseptic oral rinse  15 mL Mouth Rinse TID  . aspirin EC  81 mg Oral Daily  . atorvastatin  80 mg Oral q1800  . furosemide  40 mg Intravenous BID  . glyBURIDE  5 mg Oral BID WC  . heparin  5,000 Units Subcutaneous 3 times per day  . insulin aspart  0-20 Units Subcutaneous TID WC  . linagliptin  5 mg Oral Daily  . lisinopril  2.5 mg Oral Daily  . metoprolol  50 mg Oral TID  . pantoprazole  40 mg Oral Daily  . ticagrelor  90 mg Oral BID    OBJECTIVE  Filed Vitals:   09/12/13 1330 09/12/13 2022 09/12/13 2110 09/13/13 0432  BP: 98/58 90/56 92/60  94/59  Pulse: 79 72  73  Temp: 98 F (36.7 C) 99.5 F (37.5 C)  99.5 F (37.5 C)  TempSrc: Oral Oral  Oral  Resp: 18 17  18   Height:      Weight:    196 lb (88.905 kg)  SpO2: 99% 98%  98%    Intake/Output Summary (Last 24 hours) at 09/13/13 0840 Last data filed at 09/13/13 0730  Gross per 24 hour  Intake    600 ml  Output    753 ml  Net   -153  ml   Filed Weights   09/09/13 0559 09/12/13 0400 09/13/13 0432  Weight: 209 lb (94.802 kg) 194 lb 3.6 oz (88.1 kg) 196 lb (88.905 kg)    PHYSICAL EXAM  General: Pleasant, NAD. Neuro: Alert and oriented X 3. Moves all extremities spontaneously. Psych: Normal affect. HEENT:  Normal  Neck: Supple without bruits or JVD. Lungs:  Resp regular and unlabored, largely CTA, mild intermittent rale in bilateral bases Heart: RRR no s3, s4. 1/6 systolic murmurs. Abdomen: Soft, non-tender, non-distended, BS + x 4.  Extremities: No clubbing, cyanosis. DP/PT/Radials 2+ and equal bilaterally. 1+ nonpitting edema  Accessory Clinical Findings  Basic Metabolic Panel  Recent Labs  09/12/13 0943  NA 141  K 4.2  CL 101  CO2 21  GLUCOSE 264*  BUN 56*  CREATININE 1.83*  CALCIUM 8.5   Cardiac Enzymes  Recent Labs  09/12/13 1457 09/12/13 2003 09/13/13 0315  TROPONINI >20.00* >20.00* >20.00*   Thyroid Function Tests  Recent Labs  09/11/13 0900  T3FREE 2.1*    TELE  NSR with HR 70-90s, no significant ventricular ectopy  ECG  NSR with HR 80s, PACs, Q wave in inferior lead  Radiology/Studies  Dg Chest Port 1 View  09/10/2013   CLINICAL DATA:  Evaluate edema  EXAM: PORTABLE CHEST - 1 VIEW  COMPARISON:  09/09/2013  FINDINGS: Unchanged cardiomegaly. Upper mediastinal contours are stable. Noted changes of CABG.  Bilateral airspace disease is again seen, persistent on the right but with significant clearing on the left. Although the asymmetry is concerning for pneumonia, noted history of myocardial infarction with stenting. No evidence of effusion or pneumothorax.  IMPRESSION: Improving pulmonary edema, especially on the left. Alveolar opacification remains extensive however.   Electronically Signed   By: Jorje Guild M.D.   On: 09/10/2013 06:36   Dg Chest Port 1 View  09/09/2013   CLINICAL DATA:  Dyspnea  EXAM: PORTABLE CHEST - 1 VIEW  COMPARISON:  None.  FINDINGS: Borderline  enlarged cardiac silhouette and mediastinal contours post median sternotomy and CABG. The pulmonary vasculature is indistinct with extensive bilateral though perihilar predominant heterogeneous airspace opacities. Trace bilateral effusions are not excluded. No pneumothorax. Unchanged bones.  IMPRESSION: Findings most worrisome for alveolar pulmonary edema, though note, underlying infection is not excluded.   Electronically Signed   By: Sandi Mariscal M.D.   On: 09/09/2013 20:26    ASSESSMENT AND PLAN  1. Inferiolateral STEMI  - Cath 09/09/2013 100% occluded SVG to OM1-2 treated with BMS x 2, patent LIMA to LAD, aborted subacute to chronic SVG to rPAD  - DAT for at least 3 mon. Continue medial Rx   - continue to trend troponin, which has continue to be elevated >20  - consider changing 50mg  TID metoprolol to 50mg  BID as SBP remain in 90s, unable to titrate lisinorpil (multiple doses of metoprolol held due to hypotension)  2. Post-procedural pulm edema rquiring BiPAP  - 2/2 ischemic cardiomyopathy  - proBNP >9000 7/18, recheck proBNP  3. 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 Acute on chronic renal insufficiency  - contrast induced vs diuresis  - recheck BMET today, Cr 1.56 -->  1.83 yesterday  - will keep ACEI for now unless BMET today came back with worsening renal function  4. Severe ischemic cardiomyopathy  5. Acute on chronic systolic and diastolic HF  - recheck BMET, may be able to transition to PO lasix tomorrow, if Cr remain high, will hold PM dose of IV lasix today and transition tomorrow  6. HTN 7. Hyperlipidemia 8. DM 9. Borderline low Free T3, Free T4 normal     Signed, Woodward Ku Pager: [phone]  History and all data above reviewed.  Patient  examined.  I agree with the findings as above.  She wants to go home today.  The patient exam reveals COR:RRR  ,  Lungs: Few basilar crackles  ,  Abd: Positive bowel sounds, no rebound no guarding, Ext No edema  .  All available labs, radiology testing, previous records reviewed. Agree with documented assessment and plan. Reduced beta blocker dose and changed hold parameters.  Checking BMET today.  Hold Lasix if creat is trending up.  Switch to PO in the AM.  Probable discharge in the AM.  She will follow with me in [place] since she did not have a cardiologist.    Minus Breeding  9:35 AM  09/13/2013

## 2013-09-13 NOTE — Progress Notes (Signed)
CARE MANAGEMENT NOTE 09/13/2013  Patient:  Jo Lynn, Jo Lynn   Account Number:  [id number]  Date Initiated:  09/09/2013  Documentation initiated by:  Luz Lex  Subjective/Objective Assessment:   STEMI     Action/Plan:   Anticipated DC Date:  09/13/2013   Anticipated DC Plan:  [place]  CM consult      Choice offered to / List presented to:             Status of service:  Completed, signed off Medicare Important Message given?   (If response is "NO", the following Medicare IM given date fields will be blank) Date Medicare IM given:   Medicare IM given by:   Date Additional Medicare IM given:   Additional Medicare IM given by:    Discharge Disposition:  HOME/SELF CARE  Per UR Regulation:  Reviewed for med. necessity/level of care/duration of stay  If discussed at [place] of Stay Meetings, dates discussed:    Comments:  09/13/2013 1155 NCM provided pt with [employer] card for 30 day free trial and savings card. Pt states she did not have any problems with getting meds. Encouraged her to call patient support for [employer] to enroll in their program. Jonnie Finner RN CCM Case Mgmt phone [phone]  Contact:  Delford Field   [phone]

## 2013-09-13 NOTE — Progress Notes (Signed)
09/13/2013 1715 Pt copay $50 for Bril[employer], with no prior auth required. Pt has savings card for copay. Jonnie Finner RN CCM Case Mgmt phone [phone]

## 2013-09-14 ENCOUNTER — Other Ambulatory Visit: Payer: Self-pay | Admitting: Physician Assistant

## 2013-09-14 ENCOUNTER — Telehealth: Payer: Self-pay | Admitting: *Deleted

## 2013-09-14 DIAGNOSIS — I255 Ischemic cardiomyopathy: Secondary | ICD-10-CM

## 2013-09-14 LAB — BASIC METABOLIC PANEL
Anion gap: 14 (ref 5–15)
BUN: 59 mg/dL — ABNORMAL HIGH (ref 6–23)
CHLORIDE: 103 meq/L (ref 96–112)
CO2: 23 mEq/L (ref 19–32)
Calcium: 8.7 mg/dL (ref 8.4–10.5)
Creatinine, Ser: 1.8 mg/dL — ABNORMAL HIGH (ref 0.50–1.10)
GFR calc non Af Amer: 28 mL/min — ABNORMAL LOW (ref >90)
GFR, EST AFRICAN AMERICAN: 32 mL/min — AB (ref >90)
Glucose, Bld: 167 mg/dL — ABNORMAL HIGH (ref 70–99)
POTASSIUM: 3.8 meq/L (ref 3.7–5.3)
SODIUM: 140 meq/L (ref 137–147)

## 2013-09-14 LAB — TROPONIN I
TROPONIN I: 8.25 ng/mL — AB (ref <0.30)
Troponin I: 10.33 ng/mL (ref <0.30)
Troponin I: 6.1 ng/mL (ref <0.30)

## 2013-09-14 LAB — GLUCOSE, CAPILLARY
Glucose-Capillary: 163 mg/dL — ABNORMAL HIGH (ref 70–99)
Glucose-Capillary: 238 mg/dL — ABNORMAL HIGH (ref 70–99)

## 2013-09-14 MED ORDER — ATORVASTATIN CALCIUM 80 MG PO TABS
80.0000 mg | ORAL_TABLET | Freq: Every day | ORAL | Status: DC
Start: 2013-09-14 — End: 2013-09-22

## 2013-09-14 MED ORDER — TICAGRELOR 90 MG PO TABS: 90.0000 mg | ORAL_TABLET | Freq: Two times a day (BID) | ORAL | Status: DC

## 2013-09-14 MED ORDER — METOPROLOL TARTRATE 25 MG PO TABS: 25.0000 mg | ORAL_TABLET | Freq: Three times a day (TID) | ORAL | Status: DC

## 2013-09-14 MED ORDER — FUROSEMIDE 40 MG PO TABS: 40.0000 mg | ORAL_TABLET | Freq: Every day | ORAL | Status: DC

## 2013-09-14 MED ORDER — LISINOPRIL 2.5 MG PO TABS: 2.5000 mg | ORAL_TABLET | Freq: Every day | ORAL | Status: DC

## 2013-09-14 MED ORDER — ASPIRIN 81 MG PO TBEC: 81.0000 mg | DELAYED_RELEASE_TABLET | Freq: Every day | ORAL | Status: DC

## 2013-09-14 NOTE — Progress Notes (Signed)
SUBJECTIVE:  No chest pain.  No SOB   PHYSICAL EXAM Filed Vitals:   09/13/13 0919 09/13/13 1307 09/13/13 2127 09/14/13 0448  BP: 96/50 100/58 99/63 98/63   Pulse:  76 63 68  Temp:  98.2 F (36.8 C) 98.9 F (37.2 C) 99.1 F (37.3 C)  TempSrc:  Oral Oral Oral  Resp:  18 18 17   Height:      Weight:    195 lb 5.2 oz (88.6 kg)  SpO2:  99% 95% 99%   General:  No distress Lungs:  Clear Heart:  RRR Abdomen:  Positive bowel sounds, no rebound no guarding Extremities:  No edema   LABS: Lab Results  Component Value Date   TROPONINI 10.33* 09/14/2013   Results for orders placed during the hospital encounter of 09/09/13 (from the past 24 hour(s))  TROPONIN I     Status: Abnormal   Collection Time    09/13/13  8:50 AM      Result Value Ref Range   Troponin I >20.00 (*) <0.30 ng/mL  BASIC METABOLIC PANEL     Status: Abnormal   Collection Time    09/13/13  8:50 AM      Result Value Ref Range   Sodium 143  137 - 147 mEq/L   Potassium 3.7  3.7 - 5.3 mEq/L   Chloride 102  96 - 112 mEq/L   CO2 24  19 - 32 mEq/L   Glucose, Bld 185 (*) 70 - 99 mg/dL   BUN 57 (*) 6 - 23 mg/dL   Creatinine, Ser 1.81 (*) 0.50 - 1.10 mg/dL   Calcium 8.4  8.4 - 10.5 mg/dL   GFR calc non Af Amer 27 (*) >90 mL/min   GFR calc Af Amer 32 (*) >90 mL/min   Anion gap 17 (*) 5 - 15  PRO B NATRIURETIC PEPTIDE     Status: Abnormal   Collection Time    09/13/13  8:50 AM      Result Value Ref Range   Pro B Natriuretic peptide (BNP) 2838.0 (*) 0 - 125 pg/mL  GLUCOSE, CAPILLARY     Status: Abnormal   Collection Time    09/13/13 11:03 AM      Result Value Ref Range   Glucose-Capillary 219 (*) 70 - 99 mg/dL   Comment 1 Notify RN     Comment 2 Documented in Chart    TROPONIN I     Status: Abnormal   Collection Time    09/13/13  3:40 PM      Result Value Ref Range   Troponin I 18.47 (*) <0.30 ng/mL  GLUCOSE, CAPILLARY     Status: Abnormal   Collection Time    09/13/13  4:25 PM      Result Value Ref Range    Glucose-Capillary 198 (*) 70 - 99 mg/dL   Comment 1 Notify RN     Comment 2 Documented in Chart    TROPONIN I     Status: Abnormal   Collection Time    09/13/13  8:20 PM      Result Value Ref Range   Troponin I 13.62 (*) <0.30 ng/mL  GLUCOSE, CAPILLARY     Status: Abnormal   Collection Time    09/13/13  9:23 PM      Result Value Ref Range   Glucose-Capillary 227 (*) 70 - 99 mg/dL  TROPONIN I     Status: Abnormal   Collection Time    09/14/13  3:50  AM      Result Value Ref Range   Troponin I 10.33 (*) <0.30 ng/mL  BASIC METABOLIC PANEL     Status: Abnormal   Collection Time    09/14/13  3:50 AM      Result Value Ref Range   Sodium 140  137 - 147 mEq/L   Potassium 3.8  3.7 - 5.3 mEq/L   Chloride 103  96 - 112 mEq/L   CO2 23  19 - 32 mEq/L   Glucose, Bld 167 (*) 70 - 99 mg/dL   BUN 59 (*) 6 - 23 mg/dL   Creatinine, Ser 1.80 (*) 0.50 - 1.10 mg/dL   Calcium 8.7  8.4 - 10.5 mg/dL   GFR calc non Af Amer 28 (*) >90 mL/min   GFR calc Af Amer 32 (*) >90 mL/min   Anion gap 14  5 - 15  GLUCOSE, CAPILLARY     Status: Abnormal   Collection Time    09/14/13  6:25 AM      Result Value Ref Range   Glucose-Capillary 163 (*) 70 - 99 mg/dL    Intake/Output Summary (Last 24 hours) at 09/14/13 0826 Last data filed at 09/13/13 1712  Gross per 24 hour  Intake    360 ml  Output   1400 ml  Net  -1040 ml     ASSESSMENT AND PLAN:  INFERIOR MI:  Troponin still elevated but trending down.Marland Kitchen  However the patient has been stable without any ongoing symptoms.  She will go home with a Life Vest.  No work for 1 month at least.  She needs TOC appt ([place] would be best for her) within the week.  See me in my [place] office within the month.    ISCHEMIC CARDIOMYOPATHY:  Acute on chronic systolic and diastolic HF.    I will change to Coreg 3.125 bid.  Continue low dose ACE inhibitor.    ACUTE ON CHRONIC RENAL INSUFFICIENCY:  Creat is stable at 1.8.   Check BMET within 5 days.    HTN:  This  is being managed in the context of treating his CHF.    DM:  Continue current meds.     Jeneen Rinks [facility] 09/14/2013 8:26 AM

## 2013-09-14 NOTE — Discharge Instructions (Signed)
Acute Coronary Syndrome  Acute coronary syndrome (ACS) is an urgent problem in which the blood and oxygen supply to the heart is critically deficient. ACS requires hospitalization because one or more coronary arteries may be blocked.  ACS represents a range of conditions including:  · Previous angina that is now unstable, lasts longer, happens at rest, or is more intense.  · A heart attack, with heart muscle cell injury and death.  There are three vital coronary arteries that supply the heart muscle with blood and oxygen so that it can pump blood effectively. If blockages to these arteries develop, blood flow to the heart muscle is reduced. If the heart does not get enough blood, angina may occur as the first warning sign.  SYMPTOMS   · The most common signs of angina include:  ¨ Tightness or squeezing in the chest.  ¨ Feeling of heaviness on the chest.  ¨ Discomfort in the arms, neck, back, or jaw.  ¨ Shortness of breath and nausea.  ¨ Cold, wet skin.  · Angina is usually brought on by physical effort or excitement which increase the oxygen needs of the heart. These states increase the blood flow needs of the heart beyond what can be delivered.  · Other symptoms that are not as common include:  ¨ Fatigue  ¨ Unexplained feelings of nervousness or anxiety  ¨ Weakness  ¨ Diarrhea  · Sometimes, you may not have noticed any symptoms at all but still suffered a cardiac injury.  TREATMENT   · Medicines to help discomfort may include nitroglycerin (nitro) in the form of tablets or a spray for rapid relief, or longer-acting forms such as cream, patches, or capsules. (Be aware that there are many side effects and possible interactions with other drugs).  · Other medicines may be used to help the heart pump better.  · Procedures to open blocked arteries including angioplasty or stent placement to keep the arteries open.  · Open heart surgery may be needed when there are many blockages or they are in critical locations that  are best treated with surgery.  HOME CARE INSTRUCTIONS   · Do not use any tobacco products including cigarettes, chewing tobacco, or electronic cigarettes.  · Take one baby or adult aspirin daily, if your health care provider advises. This helps reduce the risk of a heart attack.  · It is very important that you follow the angina treatment prescribed by your health care provider. Make arrangements for proper follow-up care.  · Eat a heart healthy diet with salt and fat restrictions as advised.  · Regular exercise is good for you as long as it does not cause discomfort. Do not begin any new type of exercise until you check with your health care provider.  · If you are overweight, you should lose weight.  · Try to maintain normal blood lipid levels.  · Keep your blood pressure under control as recommended by your health care provider.  · You should tell your health care provider right away about any increase in the severity or frequency of your chest discomfort or angina attacks. When you have angina, you should stop what you are doing and sit down. This may bring relief in 3 to 5 minutes. If your health care provider has prescribed nitro, take it as directed.  · If your health care provider has given you a follow-up appointment, it is very important to keep that appointment. Not keeping the appointment could result in a chronic or   of breath.  You feel faint, lightheaded, or pass out.  Your chest discomfort gets worse.  You are sweating or experience sudden profound fatigue.  You do not get relief of your chest pain after 3 doses of nitro.  Your discomfort lasts longer than 15 minutes. MAKE SURE YOU:   Understand these instructions.  Will watch your condition.  Will get help right  away if you are not doing well or get worse.  Take all medicines as directed by your health care provider. Document Released: 02/11/2005 Document Revised: 02/16/2013 Document Reviewed: 09/15/2007 [facility] Patient Information 2015 [place], [place]. This information is not intended to replace advice given to you by your health care provider. Make sure you discuss any questions you have with your health care provider.  Angina Pectoris Angina pectoris is extreme discomfort in your chest, neck, or arm. Your doctor may call it just angina. It is caused by a lack of oxygen to your heart wall. It may feel like tightness or heavy pressure. It may feel like a crushing or squeezing pain. Some people say it feels like gas. It may go down your shoulders, back, and arms. Some people have symptoms other than pain. These include:  Tiredness.  Shortness of breath.  Cold sweats.  Feeling sick to your stomach (nausea). There are four types of angina:  Stable angina. This type often lasts the same amount of time each time it happens. Activity, stress, or excitement can bring it on. It often gets better after taking a medicine called nitroglycerin. This goes under your tongue.  Unstable angina. This type can happen when you are not active or even during sleep. It can suddenly get worse or happen more often. It may not get better after taking the special medicine. It can last up to 30 minutes.  Microvascular angina. This type is more common in women. It may be more severe or last longer than other types.  Prinzmetal angina. This type often happens when you are not active or in the early morning hours. HOME CARE   Only take medicines as told by your doctor.  Stay active or exercise more as told by your doctor.  Limit very hard activity as told by your doctor.  Limit heavy lifting as told by your doctor.  Keep a healthy weight.  Learn about and eat foods that are healthy for your heart.  Do not use any  tobacco such as cigarettes, chewing tobacco, or e-cigarettes. GET HELP RIGHT AWAY IF:   You have chest, neck, deep shoulder, or arm pain or discomfort that lasts more than a few minutes.  You have chest, neck, deep shoulder, or arm pain or discomfort that goes away and comes back over and over again.  You have heavy sweating that seems to happen for no reason.  You have shortness of breath or trouble breathing.  Your angina does not get better after a few minutes of rest.  Your angina does not get better after you take nitroglycerin medicine. These can all be symptoms of a heart attack. Get help right away. Call your local emergency service (911 in [place] Do not  drive yourself to the hospital. Do not  wait to for your symptoms to go away. MAKE SURE YOU:   Understand these instructions.  Will watch your condition.  Will get help right away if you are not doing well or get worse. Document Released: 07/31/2007 Document Revised: 02/16/2013 Document Reviewed: 11/21/2011 [facility] Patient Information 2015 [place], [place]. This information  is not intended to replace advice given to you by your health care provider. Make sure you discuss any questions you have with your health care provider.  No driving for 48 hours. No lifting over 5 lbs for 1 week. No sexual activity for 1 week. NO WORK IN 1 MONTH. Keep procedure site clean & dry. If you notice increased pain, swelling, bleeding or pus, call/return!  You may shower, but no soaking baths/hot tubs/pools for 1 week.

## 2013-09-14 NOTE — Discharge Summary (Signed)
Discharge Summary   Patient ID: Jo Lynn,  MRN: [id number], DOB/AGE: 08-21-1945 68 y.o.  Admit date: 09/09/2013 Discharge date: 09/14/2013  Primary Care Provider: [facility] Primary Cardiologist: Dr. Percival Spanish  Discharge Diagnoses Principal Problem:   ST elevation myocardial infarction (STEMI) of inferolateral wall Active Problems:   Essential hypertension   Diabetes mellitus type 2 with complications   Hypoxemia   Acute respiratory failure - secondary to acute pulmonary edema related to Acute on Chronic Combined CHF - STEMI   Acute on chronic combined systolic and diastolic CHF (congestive heart failure) - in setting of recent Large Inferolateral STEMI with existing RCA/SVG-RCA occlusion   Cardiomyopathy, ischemic - EF ~20-25%; Large MI, existing occluded RCA & SVG-RCA; h/o CABG   At risk for sudden cardiac death   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   Allergies No Known Allergies  Procedures  CARDIAC CATHETERIZATION AND PERCUTANEOUS CORONARY INTERVENTION REPORT  FINDINGS:  Hemodynamics:  Central Aortic Pressure / Mean: 99/62/78 mmHg  Left Ventricular Pressure / LVEDP: 98/18/26 mmHg Left Ventriculography:  EF: ~20 %  Wall Motion: global hypokinesis with basal to mid inferior akinesis Coronary Anatomy:  Dominance: Right Left Main: normal caliber, mild calcification; bifurcates into the LAD & Circumflex LAD: 100% occluded just after the ostium  Left Circumflex: Normal caliber vessel with early mid 95% stenosis followed by occlusion beyond OM1 take-off. OM1 is also 100% occluded.  SVG-OM1-2: Large caliber graft with 100% occlusion in the mid portion of the proximal limb.  Post reperfusion angiography revealed a residual ~95% stenosis in the proximal limb along with a 90-95% focal stenosis just beyond the initial anastomosis in the sequential limb.  OM1 & OM2 are a moderate caliber vessels with minimal luminal  irregularities RCA: Normal caliber vessel - 100% occluded in the distal mid vessel, a RV marginal branch from the early portion of the mid RCA provides collaterals to the posterolateral system. Right Posterior AV Groove Branch (RPAV)  SVG-RPDA: 100% occluded, proximal After reviewing the initial angiography, the culprit lesion was thought to be the occluded sequential SVG-OM1-OM2. Preparation were made to proceed with PCI on this lesion.  Percutaneous Coronary Intervention:  Lesion #1A: 100% occluded SVG-OM1-2, mid Proximal LIMB - reduced to 0%; TIMI 0 --> TIMI 3 flow  Guide: 6 Fr AL1 Guidewire: Prowater  Predilation Balloon: Emerge 2.5 mm x 12 mm;  10 Atm x 30 Sec, 12 Atm x 30 Sec -- Stent: [place] Sci Rebel BMS 4.0 mm x 16 mm;  Max Inflation 16 Atm x 30 Sec -- 4.2 mm Lesion #1B 95% stenosis beyond OM1 anastomosis in the sequential limb. 95% reduced to 0%; TIMI 0 --> TIMI 3 flow  (lesion identified after initial reperfusion).  Stent: [place] Sci Rebel BMS 4.0 mm x 12 mm; The proximal stent crosses the anastomosis.  Max Inflation 16 Atm x 30 Sec - 4.2 mm Post deployment angiography in multiple views, with and without guidewire in place revealed excellent stent deployment and lesion coverage. There was no evidence of dissection or perforation.  Due to the patient's borderline BP and severely reduced LVEF, an attempt was made to revascularize the SVG-RCA.  Lesion #2: Proximal SVG-RCA 100% pre & post; TIMI 0 flow pre & post -- UNSUCCESSFUL,  Guide: 6 Fr AL1 Guidewire: Prowater  Predilation Balloon: Emerge 2.5 mm x 12 mm;  6 Atm x 25 Sec  Despite being able to pass the guidewire beyond the initial lesion, the balloon would not pass.  The patent portion of the graft was quite small -- this led to the conclusion that this occlusion was most likely not acute; The attempt was abandoned. MEDICATIONS:  Anesthesia: Local Lidocaine 14 ml Sedation: 1 mg IV Versed, 50 mcg IV fentanyl ;  Premedication: 4000  units IV Heparin, 324 mg ASA Omnipaque Contrast: 155 ml  Anticoagulation: Angiomax Bolus & drip  Anti-Platelet Agent: Brilinta 180 mg  IV Lasix 40 mg  High-flow O2 by NRB mask. PATIENT DISPOSITION:  The patient was transferred to the PACU holding area in a hemodynamicaly stable, chest pain free condition.  The patient tolerated the procedure well, and there were no complications. EBL: < 10 ml  The patient was stable before, during, and after the procedure. POST-OPERATIVE DIAGNOSIS:  Severe Native CAD with LAD, Circumflex (OM1 & 2) and RCA 100% occluded.  100% Acute occlusion of the proximal limb of SVG-OM1-OM2 with a second 95% just beyond the anastomosis in the sequential limb.  Successful 2 Site PCI with Rebel BMS 4.0 mm stents Patent LIMA LAD, but 100% subacute to chronic occlusion of SVG-RCA.  Severe Ischemic Cardiomyopathy with EF ~20% PLAN OF CARE:  Admit to ICU - not a Fast Track patient  Confirm EF with Echocardiogram - would consider LifeVest prior to d/c.  Hold ACE-I initially, but continue BB due to tachycardia;  Will need IV diuresis & titration of CHF medications DAPT x at least 1 month, but would continue x 1 yr.   Hospital Course  The patient is a 68 year old Caucasian female with past medical history of hypertension, hyperlipidemia, diabetes, of coronary artery disease status post CABG in 2006 with LIMA to LAD, sequential SVG to OM1 and OM2, and SVG to PDA. She presented to [facility] on 09/09/2013 after waking up around 4:45 AM with chest tightness and severe back pain. She called EMS and was found to have EKG consistent with acute inferior STEMI. Patient was a poor historian and was unable to tell who her primary cardiologist was and where she had her bypass surgery. She was taken emergently to the cath lab. Cardiac catheterization revealed 100% occluded SVG to OM1 and OM 2 which was treated with bare-metal stent. There was also a 95% stenosis beyond OM1 anastomosis  in the sequential limb, which was also treated with bare-metal stent. LIMA to LAD appears to be patent. However she does have 100% subacute to chronic occlusion of SVG to RCA. Her EF was estimated 20% during cardiac catheterization. Post procedure, patient was diuresed with IV diuresis. Her troponin remained >20 for the first 4 days after cardiac catheterization. Her troponin began to trend down on the 4th day after cardiac cath. Echocardiogram was obtained on 09/10/2013 which showed EF 25-30%, akinesis of the inferolateral, inferior and inferoseptal myocardium, grade 1 diastolic dysfunction, moderately increased pulmonary systolic pressure. Post cardiac catheterization, she did have some acute on chronic renal insufficiency. Her creatinine jumped from 1.24 up to 1.69 the day after cardiac catheterization. She was initially placed on IV Lasix, and transitioned to PO Lasix in the morning of 09/14/2013.  She was seen the morning of 7/21, her creatinine has been stable and began to trend down to 1.8 from a peak of 1.83 on 7/19. She appears to be stable and denies any chest discomfort or shortness breath. She will continue on aspirin, Lipitor, 40 mg daily Lasix, low dose lisinopril, metoprolol, and Brilinta. Due to the extensive nature of her myocardial infarction, I have scheduled her to be followed up  in the transition clinic within 7 days. I have also scheduled a followup with Dr. Percival Spanish within one month at [facility] office. Due to her recently worsening renal function, patient will need a basic metabolic panel within a week after discharge to monitor renal function. I have given her a paper prescription for her to take to the local hospital to obtain labs and send results to Dr. Marinda Elk office. Further emphasis has been placed on compliance with medical therapy. Given her significantly low EF, patient will be discharged later today with the LifeVest. Echocardiogram should be repeated in one to 3 month to evaluate  possibility of ICD placement. Patient has been advised not to work within one month.   Discharge Vitals Blood pressure 119/55, pulse 74, temperature 99.1 F (37.3 C), temperature source Oral, resp. rate 17, height $RemoveBe'5\' 1"'zMBGUhpHV$  (1.549 m), weight 195 lb 5.2 oz (88.6 kg), SpO2 99.00%.  Filed Weights   09/12/13 0400 09/13/13 0432 09/14/13 0448  Weight: 194 lb 3.6 oz (88.1 kg) 196 lb (88.905 kg) 195 lb 5.2 oz (88.6 kg)    Labs   Basic Metabolic Panel  Recent Labs  09/13/13 0850 09/14/13 0350  NA 143 140  K 3.7 3.8  CL 102 103  CO2 24 23  GLUCOSE 185* 167*  BUN 57* 59*  CREATININE 1.81* 1.80*  CALCIUM 8.4 8.7   Cardiac Enzymes  Recent Labs  09/13/13 2020 09/14/13 0350 09/14/13 0825  TROPONINI 13.62* 10.33* 8.25*   Disposition  Pt is being discharged home today in good condition.  Follow-up Plans & Appointments      Follow-up Information   Follow up with Minus Breeding, MD On 10/13/2013. (3:15pm)    Specialty:  Cardiology   Contact information:   [place] [address] [phone]       Follow up with [facility] On 09/21/2013. (10:40am Transition of Care with Dr. Harl Bowie within 7 days after discharge. p.s [place] office recently relocated, if unable to find it, please call [phone] or [phone])    Contact information:   [place], [place]      Please follow up. (patient to check her BMET at [facility] lab within 1 week and send result to Dr. Rosezella [place] office)       Discharge Medications    Medication List    STOP taking these medications       aspirin 325 MG tablet  Replaced by:  aspirin 81 MG EC tablet     lisinopril-hydrochlorothiazide 20-25 MG per tablet  Commonly known as:  PRINZIDE,ZESTORETIC      TAKE these medications       aspirin 81 MG EC tablet  Take 1 tablet (81 mg total) by mouth daily.     atorvastatin 80 MG tablet  Commonly known as:  LIPITOR  Take 1 tablet (80 mg total) by mouth daily at 6 PM.      furosemide 40 MG tablet  Commonly known as:  LASIX  Take 1 tablet (40 mg total) by mouth daily.     glyBURIDE 5 MG tablet  Commonly known as:  DIABETA  Take 5 mg by mouth 2 (two) times daily with a meal.     lisinopril 2.5 MG tablet  Commonly known as:  PRINIVIL,ZESTRIL  Take 1 tablet (2.5 mg total) by mouth daily.     metoprolol tartrate 25 MG tablet  Commonly known as:  LOPRESSOR  Take 1 tablet (25 mg total) by mouth 3 (three) times daily.  saxagliptin HCl 2.5 MG Tabs tablet  Commonly known as:  ONGLYZA  Take 1 tablet (2.5 mg total) by mouth daily.     ticagrelor 90 MG Tabs tablet  Commonly known as:  BRILINTA  Take 1 tablet (90 mg total) by mouth 2 (two) times daily.        Outstanding Labs/Studies  BMET within 1 week  Duration of Discharge Encounter   Greater than 30 minutes including physician time.  Hilbert Corrigan PA-C 09/14/2013, 12:25 PM   Patient seen and examined.  Plan as discussed in my rounding note for today and outlined above. Jeneen Rinks [facility]  09/14/2013  12:29 PM

## 2013-09-14 NOTE — Telephone Encounter (Signed)
7 day TCM from [facility]

## 2013-09-14 NOTE — Progress Notes (Signed)
[id number] Cardiac Rehab Completed MI and diet education with pt. She voices understanding. Pt agrees to [facility]. CRP in [place], will send referral. Pt is most interested in returning to work, this seems to be her biggest concern. I explained to her about the life vest. She is reluctant to go home with it. "They fixed my heart why would I need that." I attempted to make her understand its important's. I placed life vest video on for her to watch..After explanation she seems a little more accepting of it.  Deon Pilling, RN 09/14/2013 10:05 AM

## 2013-09-15 NOTE — Telephone Encounter (Signed)
Left message to return call.  Spoke with daughter Aldona Bar) -   Patient contacted regarding discharge from [facility] on 09/14/2013.    Patient understands to follow up with provider Dr. Percival Spanish on 09/21/2013 at 3:15 at [facility] office.   Patient understands discharge instructions? YES  Patient understands medications and regiment? YES Patient understands to bring all medications to this visit? YES  Daughter states she has had her up walking this morning & no c/o SOB.

## 2013-09-16 NOTE — Telephone Encounter (Signed)
Noted! Thank you

## 2013-09-21 ENCOUNTER — Encounter: Payer: BC Managed Care – PPO | Admitting: Cardiology

## 2013-09-22 ENCOUNTER — Other Ambulatory Visit (INDEPENDENT_AMBULATORY_CARE_PROVIDER_SITE_OTHER): Payer: BC Managed Care – PPO

## 2013-09-22 ENCOUNTER — Encounter: Payer: Self-pay | Admitting: *Deleted

## 2013-09-22 ENCOUNTER — Encounter: Payer: Self-pay | Admitting: Cardiology

## 2013-09-22 ENCOUNTER — Ambulatory Visit (INDEPENDENT_AMBULATORY_CARE_PROVIDER_SITE_OTHER): Payer: BC Managed Care – PPO | Admitting: Cardiology

## 2013-09-22 VITALS — BP 115/68 | HR 72 | Ht 66.0 in | Wt 184.0 lb

## 2013-09-22 DIAGNOSIS — Z79899 Other long term (current) drug therapy: Secondary | ICD-10-CM

## 2013-09-22 DIAGNOSIS — I25710 Atherosclerosis of autologous vein coronary artery bypass graft(s) with unstable angina pectoris: Secondary | ICD-10-CM

## 2013-09-22 DIAGNOSIS — I5043 Acute on chronic combined systolic (congestive) and diastolic (congestive) heart failure: Secondary | ICD-10-CM

## 2013-09-22 NOTE — Patient Instructions (Signed)
The current medical regimen is effective;  continue present plan and medications.  Please have blood work today (BMP)  Follow up in 1 month with Dr Percival Spanish in the [place] office.

## 2013-09-22 NOTE — Progress Notes (Signed)
HPI The patient presents for transitional care appointment. She was in the hospital 8 days ago with an acute inferior myocardial infarction and received 2 bare-metal stents to a vein graft to obtuse marginal 1 and obtuse marginal 2. She had a resultant ejection fraction of about 20%. We tried to get her to wear a life vest at discharge but she did not want this.   Since discharge she's done well. She denies any palpitations, presyncope or syncope. She's had a little bit of shortness of breath. She's had none of the arm discomfort that was her previous angina. She denies any chest pressure, neck discomfort. She's not having any PND or orthopnea. She's had no palpitations, presyncope or syncope. She has had no weight gain.   No Known Allergies  Current Outpatient Prescriptions  Medication Sig Dispense Refill  . aspirin EC 81 MG EC tablet Take 1 tablet (81 mg total) by mouth daily.      Marland Kitchen atorvastatin (LIPITOR) 40 MG tablet Take 40 mg by mouth daily.      . furosemide (LASIX) 40 MG tablet Take 1 tablet (40 mg total) by mouth daily.  30 tablet  3  . glyBURIDE (DIABETA) 5 MG tablet Take 5 mg by mouth 2 (two) times daily with a meal.      . lisinopril (PRINIVIL,ZESTRIL) 2.5 MG tablet Take 1 tablet (2.5 mg total) by mouth daily.  30 tablet  12  . metoprolol (LOPRESSOR) 50 MG tablet Take 50 mg by mouth 2 (two) times daily.      . ticagrelor (BRILINTA) 90 MG TABS tablet Take 1 tablet (90 mg total) by mouth 2 (two) times daily.  60 tablet  12  . atorvastatin (LIPITOR) 80 MG tablet Take 40 mg by mouth daily at 6 PM.      . saxagliptin HCl (ONGLYZA) 2.5 MG TABS tablet Take 1 tablet (2.5 mg total) by mouth daily.  90 tablet  1   No current facility-administered medications for this visit.    Past Medical History  Diagnosis Date  . Myocardial infarction 10/2002    - MV CAD --> Referred for CABG (no follow-up post CABG)  . CAD, multiple vessel 10/2002    LAD - tandem 80% prox & mid, Cx- 60% AVG, 70%  OM, RCA 90% mid with dRCA occlusion 2 PDAs 80% --> Referred for CABG  . 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 Lucianne Lei Trigt: LIMA-LAD, SVG-RPDA, SVG-OM1-OM2  . ST elevation myocardial infarction (STEMI) of inferolateral wall 09/09/2013    SVG-Om1-2 occluded - PCI in 2 locations; Occluded SVG-RCA & native RCA, LAD & native Cx occcluded.  EF ~20%  . Cardiomyopathy, ischemic 09/09/2013    EF ~20-25%; Large MI, existing occluded RCA & SVG-RCA; h/o CABG  . 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 CAD S/P percutaneous coronary angioplasty 09/09/2013    mid SVG-OM1-OM2 (prox LIMB) Rebel BMS 4.0 mm x 16 mm; sequential limb OM1-OM2: Rebel BMS 4.0 mm x 12 mm  . Combined systolic and diastolic congestive heart failure, NYHA class 3 09/09/2013    Echo post Inferolateral STEMI: Severely reduced LVEF ~20-35%; Akinesis of Inferolateral, Inferior & Inferoseptal walls; Grade 1 DDysfxn (initial LVEDP ~26 mmHg with SBP of 99 mHg)  . Essential hypertension 08/17/2012  .  Hyperlipidemia with target LDL less than 70 08/17/2012  . Diabetes mellitus type 2 with complications 08/01/43    Past Surgical History  Procedure Laterality Date  . Coronary artery bypass graft      ROS:  As stated in the HPI and negative for all other systems.   PHYSICAL EXAM BP 115/68  Pulse 72  Ht 5\' 6"  (1.676 m)  Wt 184 lb (83.462 kg)  BMI 29.71 kg/m2 GENERAL:  Well appearing HEENT:  Pupils equal round and reactive, fundi not visualized, oral mucosa unremarkable NECK:  No jugular venous distention, waveform within normal limits, carotid upstroke brisk and symmetric, no bruits, no thyromegaly LYMPHATICS:  No cervical, inguinal adenopathy LUNGS:  Clear to auscultation bilaterally BACK:  No CVA tenderness CHEST:  Well healed  sternotomy scar. HEART:  PMI not displaced or sustained,S1 and S2 within normal limits, no S3, no S4, no clicks, no rubs, no murmurs ABD:  Flat, positive bowel sounds normal in frequency in pitch, no bruits, no rebound, no guarding, no midline pulsatile mass, no hepatomegaly, no splenomegaly EXT:  2 plus pulses throughout, no edema, no cyanosis no clubbing SKIN:  No rashes no nodules NEURO:  Cranial nerves II through XII grossly intact, motor grossly intact throughout PSYCH:  Cognitively intact, oriented to person place and time   ASSESSMENT AND PLAN  CAD:   At this point she will continue the Brilinta for at least for one month although it was suggested even with her bare-metal stents to we could consider one year given her extensive disease and large infarct.    CARDIOMYOPATHY:   She seems to be euvolemic. Her blood pressure 1 not on med titration today. She refused a life vest.   I will be following her EF in the future and she will need a defibrillator if she does not have improvement in her ejection fraction.  CKD:  I will check a BMET today.

## 2013-09-23 ENCOUNTER — Telehealth: Payer: Self-pay | Admitting: Cardiology

## 2013-09-23 LAB — BMP8+EGFR
BUN/Creatinine Ratio: 31 — ABNORMAL HIGH (ref 11–26)
BUN: 70 mg/dL — AB (ref 8–27)
CO2: 18 mmol/L (ref 18–29)
CREATININE: 2.29 mg/dL — AB (ref 0.57–1.00)
Calcium: 10.1 mg/dL (ref 8.7–10.3)
Chloride: 100 mmol/L (ref 97–108)
GFR calc non Af Amer: 21 mL/min/{1.73_m2} — ABNORMAL LOW (ref >59)
GFR, EST AFRICAN AMERICAN: 24 mL/min/{1.73_m2} — AB (ref >59)
Glucose: 325 mg/dL — ABNORMAL HIGH (ref 65–99)
Potassium: 5.2 mmol/L (ref 3.5–5.2)
Sodium: 138 mmol/L (ref 134–144)

## 2013-09-23 NOTE — Telephone Encounter (Signed)
New message ° ° ° ° °Want test results °

## 2013-09-24 ENCOUNTER — Other Ambulatory Visit: Payer: Self-pay | Admitting: *Deleted

## 2013-09-24 DIAGNOSIS — I10 Essential (primary) hypertension: Secondary | ICD-10-CM

## 2013-09-24 DIAGNOSIS — Z79899 Other long term (current) drug therapy: Secondary | ICD-10-CM

## 2013-09-24 DIAGNOSIS — E119 Type 2 diabetes mellitus without complications: Secondary | ICD-10-CM

## 2013-09-24 NOTE — Telephone Encounter (Signed)
Reviewed results with pt who states understanding. 

## 2013-09-24 NOTE — Telephone Encounter (Signed)
°

## 2013-09-30 ENCOUNTER — Other Ambulatory Visit: Payer: Self-pay | Admitting: Nurse Practitioner

## 2013-10-07 ENCOUNTER — Telehealth: Payer: Self-pay | Admitting: Cardiology

## 2013-10-07 NOTE — Telephone Encounter (Signed)
8.13.15--Received Attending Physician Statement from [email or web address]                 Given to [place] for Dr Percival Spanish to complete and sign.  Requested JC return to me when completed.

## 2013-10-08 NOTE — Telephone Encounter (Signed)
Attending Physician Statement completed, signed and returned from Dr Percival Spanish on 10/08/12  Called patient and she requested faxed to [facility].  Claim form faxed on 10/08/13 lp

## 2013-10-12 ENCOUNTER — Encounter: Payer: Self-pay | Admitting: *Deleted

## 2013-10-13 ENCOUNTER — Encounter: Payer: Self-pay | Admitting: Cardiology

## 2013-10-13 ENCOUNTER — Encounter: Payer: Self-pay | Admitting: *Deleted

## 2013-10-13 ENCOUNTER — Other Ambulatory Visit (INDEPENDENT_AMBULATORY_CARE_PROVIDER_SITE_OTHER): Payer: BC Managed Care – PPO

## 2013-10-13 ENCOUNTER — Ambulatory Visit (INDEPENDENT_AMBULATORY_CARE_PROVIDER_SITE_OTHER): Payer: BC Managed Care – PPO | Admitting: Cardiology

## 2013-10-13 VITALS — BP 123/71 | HR 59 | Ht 67.0 in | Wt 186.0 lb

## 2013-10-13 DIAGNOSIS — Z79899 Other long term (current) drug therapy: Secondary | ICD-10-CM

## 2013-10-13 DIAGNOSIS — I25810 Atherosclerosis of coronary artery bypass graft(s) without angina pectoris: Secondary | ICD-10-CM

## 2013-10-13 DIAGNOSIS — I200 Unstable angina: Secondary | ICD-10-CM

## 2013-10-13 DIAGNOSIS — I10 Essential (primary) hypertension: Secondary | ICD-10-CM

## 2013-10-13 DIAGNOSIS — I5043 Acute on chronic combined systolic (congestive) and diastolic (congestive) heart failure: Secondary | ICD-10-CM

## 2013-10-13 DIAGNOSIS — I255 Ischemic cardiomyopathy: Secondary | ICD-10-CM

## 2013-10-13 DIAGNOSIS — I2589 Other forms of chronic ischemic heart disease: Secondary | ICD-10-CM

## 2013-10-13 DIAGNOSIS — E119 Type 2 diabetes mellitus without complications: Secondary | ICD-10-CM

## 2013-10-13 DIAGNOSIS — I25710 Atherosclerosis of autologous vein coronary artery bypass graft(s) with unstable angina pectoris: Secondary | ICD-10-CM

## 2013-10-13 DIAGNOSIS — I509 Heart failure, unspecified: Secondary | ICD-10-CM

## 2013-10-13 MED ORDER — LISINOPRIL 2.5 MG PO TABS: 2.5000 mg | ORAL_TABLET | Freq: Two times a day (BID) | ORAL | Status: DC

## 2013-10-13 MED ORDER — CLOPIDOGREL BISULFATE 75 MG PO TABS: 75.0000 mg | ORAL_TABLET | Freq: Every day | ORAL | Status: DC

## 2013-10-13 NOTE — Progress Notes (Signed)
Pt came in for lab  only 

## 2013-10-13 NOTE — Progress Notes (Signed)
HPI The patient presents for follow up after an acute inferior myocardial infarction earlier this year during which she received 2 bare-metal stents to a vein graft to obtuse marginal 1 and obtuse marginal 2. She had a resultant ejection fraction of about 20%. We tried to get her to wear a life vest at discharge but she did not want this.  She says she has it but only wore it for two weeks.   At the last visit I could not titrate meds as her BP would not allow.  She's here with her daughter today. She did have an elevated creatinine at the last visit and we had her for some of her diuretic. However, she didn't go back to the repeat labs. She says she feels well. She denies any shortness of breath, PND or orthopnea. She's had no palpitations, presyncope or syncope. She's had no chest pressure, neck or arm discomfort. The arm discomfort was her previous angina.    No Known Allergies  Current Outpatient Prescriptions  Medication Sig Dispense Refill  . aspirin EC 81 MG EC tablet Take 1 tablet (81 mg total) by mouth daily.      Marland Kitchen atorvastatin (LIPITOR) 40 MG tablet Take 40 mg by mouth daily.      . furosemide (LASIX) 40 MG tablet Take 40 mg by mouth. Take 1/2 daily      . glyBURIDE (DIABETA) 5 MG tablet Take 5 mg by mouth 2 (two) times daily with a meal.      . lisinopril (PRINIVIL,ZESTRIL) 2.5 MG tablet Take 1 tablet (2.5 mg total) by mouth daily.  30 tablet  12  . metoprolol (LOPRESSOR) 50 MG tablet Take 50 mg by mouth 2 (two) times daily.      . ticagrelor (BRILINTA) 90 MG TABS tablet Take 1 tablet (90 mg total) by mouth 2 (two) times daily.  60 tablet  12   No current facility-administered medications for this visit.    Past Medical History  Diagnosis Date  . Myocardial infarction 10/2002    - MV CAD --> Referred for CABG (no follow-up post CABG)  . CAD, multiple vessel 10/2002    LAD - tandem 80% prox & mid, Cx- 60% AVG, 70% OM, RCA 90% mid with dRCA occlusion 2 PDAs 80% --> Referred for  CABG  . 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 Lucianne Lei Trigt: LIMA-LAD, SVG-RPDA, SVG-OM1-OM2  . ST elevation myocardial infarction (STEMI) of inferolateral wall 09/09/2013    SVG-Om1-2 occluded - PCI in 2 locations; Occluded SVG-RCA & native RCA, LAD & native Cx occcluded.  EF ~20%  . Cardiomyopathy, ischemic 09/09/2013    EF ~20-25%; Large MI, existing occluded RCA & SVG-RCA; h/o CABG  . 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 CAD S/P percutaneous coronary angioplasty 09/09/2013    mid SVG-OM1-OM2 (prox LIMB) Rebel BMS 4.0 mm x 16 mm; sequential limb OM1-OM2: Rebel BMS 4.0 mm x 12 mm  . Combined systolic and diastolic congestive heart failure, NYHA class 3 09/09/2013    Echo post Inferolateral STEMI: Severely reduced LVEF ~20-35%; Akinesis of Inferolateral, Inferior & Inferoseptal walls; Grade 1 DDysfxn (initial LVEDP ~26 mmHg with SBP of 99 mHg)  . Essential hypertension 08/17/2012  . Hyperlipidemia with target LDL less than  70 08/17/2012  . Diabetes mellitus type 2 with complications 3/64/6803    Past Surgical History  Procedure Laterality Date  . Coronary artery bypass graft      ROS:  As stated in the HPI and negative for all other systems.   PHYSICAL EXAM BP 123/71  Pulse 59  Ht 5\' 7"  (1.702 m)  Wt 186 lb (84.369 kg)  BMI 29.12 kg/m2 GENERAL:  Well appearing NECK:  No jugular venous distention, waveform within normal limits, carotid upstroke brisk and symmetric, no bruits, no thyromegaly LUNGS:  Clear to auscultation bilaterally BACK:  No CVA tenderness CHEST:  Well healed sternotomy scar. HEART:  PMI not displaced or sustained,S1 and S2 within normal limits, no S3, no S4, no clicks, no rubs, no murmurs ABD:  Flat, positive bowel sounds normal in frequency in pitch, no  bruits, no rebound, no guarding, no midline pulsatile mass, no hepatomegaly, no splenomegaly EXT:  2 plus pulses throughout, no edema, no cyanosis no clubbing SKIN:  No rashes no nodules   ASSESSMENT AND PLAN  CAD:   At this point she will continue the DAPT indefinitely because of the extent of her disease. I will switch to Plavix as she cannot afford the Brlinita.  CARDIOMYOPATHY:   She seems to be euvolemic. I will try to titrate the ACE inhibitor again today. Check a basic metabolic profile today and again in 2 weeks.. She refused a life vest. I had a conversation with her about a defibrillator. At this point the patient refuses that and will agree to talk about it again and I will followup repeat echo to see if her ejection fraction has improved. She understands and accepts the risk of sudden death without the lifevest .  CKD:  I will check a BMET today and in 2 weeks.

## 2013-10-13 NOTE — Patient Instructions (Addendum)
Please stop your Brilinta. Start Plavix 75 mg a day. Increase your Lisinopril to 2.5 mg one twice a day. Continue all other medications as listed.  Have blood work today (BMP) and again in 2 weeks at [facility]  Follow up with Dr Percival Spanish in 1 month.   Cardioverter Defibrillator Implantation An implantable cardioverter defibrillator (ICD) is a small, lightweight, battery-powered device that is placed (implanted) under the skin in the chest or abdomen. Your caregiver may prescribe an ICD if:  You have had an irregular heart rhythm (arrhythmia) that originated in the lower chambers of the heart (ventricles).  Your heart has been damaged by a disease (such as coronary artery disease) or heart condition (such as a heart attack). An ICD consists of a battery that lasts several years, a small computer called a pulse generator, and wires called leads that go into the heart. It is used to detect and correct two dangerous arrhythmias: a rapid heart rhythm (tachycardia) and an arrhythmia in which the ventricles contract in an uncoordinated way (fibrillation). When an ICD detects tachycardia, it sends an electrical signal to the heart that restores the heartbeat to normal (cardioversion). This signal is usually painless. If cardioversion does not work or if the ICD detects fibrillation, it delivers a small electrical shock to the heart (defibrillation) to restart the heart. The shock may feel like a strong jolt in the chest.ICDs may be programmed to correct other problems. Sometimes, ICDs are programmed to act as another type of implantable device called a pacemaker. Pacemakers are used to treat a slow heartbeat (bradycardia). LET YOUR CAREGIVER KNOW ABOUT:  Any allergies you have.  All medicines you are taking, including vitamins, herbs, eyedrops, and over-the-counter medicines and creams.  Previous problems you or members of your family have had with the use of anesthetics.  Any blood disorders you have  had.  Other health problems you have. RISKS AND COMPLICATIONS Generally, the procedure to implant an ICD is safe. However, as with any surgical procedure, complications can occur. Possible complications associated with implanting an ICD include:  Swelling, bleeding, or bruising at the site where the ICD was implanted.  Infection at the site where the ICD was implanted.  A reaction to medicine used during the procedure.  Nerve, heart, or blood vessel damage.  Blood clots. BEFORE THE PROCEDURE  You may need to have blood tests, heart tests, or a chest X-ray done before the day of the procedure.  Ask your caregiver about changing or stopping your regular medicines.  Make plans to have someone drive you home. You may need to stay in the hospital overnight after the procedure.  Stop smoking at least 24 hours before the procedure.  Take a bath or shower the night before the procedure. You may need to scrub your chest or abdomen with a special type of soap.  Do not eat or drink before your procedure for as long as directed by your caregiver. Ask if it is okay to take any needed medicine with a small sip of water. PROCEDURE  The procedure to implant an ICD in your chest or abdomen is usually done at a hospital in a room that has a large X-ray machine called a fluoroscope. The machine will be above you during the procedure. It will help your caregiver see your heart during the procedure. Implanting an ICD usually takes 1-3 hours. Before the procedure:   Small monitors will be put on your body. They will be used to check your heart,  blood pressure, and oxygen level.  A needle will be put into a vein in your hand or arm. This is called an intravenous (IV) access tube. Fluids and medicine will flow directly into your body through the IV tube.  Your chest or abdomen will be cleaned with a germ-killing (antiseptic) solution. The area may be shaved.  You may be given medicine to help you relax  (sedative).  You will be given a medicine called a local anesthetic. This medicine will make the surgical site numb while the ICD is implanted. You will be sleepy but awake during the procedure. After you are numb the procedure will begin. The caregiver will:  Make a small cut (incision). This will make a pocket deep under your skin that will hold the pulse generator.  Guide the leads through a large blood vessel into your heart and attach them to the heart muscles. Depending on the ICD, the leads may go into one ventricle or they may go to both ventricles and into an upper chamber of the heart (atrium).  Test the ICD.  Close the incision with stitches, glue, or staples. AFTER THE PROCEDURE  You may feel pain. Some pain is normal. It may last a few days.  You may stay in a recovery area until the local anesthetic has worn off. Your blood pressure and pulse will be checked often. You will be taken to a room where your heart will be monitored.  A chest X-ray will be taken. This is done to check that the cardioverter defibrillator is in the right place.  You may stay in the hospital overnight.  A slight bump may be seen over the skin where the ICD was placed. Sometimes, it is possible to feel the ICD under the skin. This is normal.  In the months and years afterward, your caregiver will check the device, the leads, and the battery every few months. Eventually, when the battery is low, the ICD will be replaced. Document Released: 11/03/2001 Document Revised: 12/02/2012 Document Reviewed: 03/02/2012 [facility] Patient Information 2015 [place], [place]. This information is not intended to replace advice given to you by your health care provider. Make sure you discuss any questions you have with your health care provider.

## 2013-10-14 LAB — BMP8+EGFR
BUN / CREAT RATIO: 23 (ref 11–26)
BUN: 38 mg/dL — AB (ref 8–27)
CO2: 21 mmol/L (ref 18–29)
CREATININE: 1.64 mg/dL — AB (ref 0.57–1.00)
Calcium: 9.6 mg/dL (ref 8.7–10.3)
Chloride: 103 mmol/L (ref 97–108)
GFR calc non Af Amer: 32 mL/min/{1.73_m2} — ABNORMAL LOW (ref >59)
GFR, EST AFRICAN AMERICAN: 37 mL/min/{1.73_m2} — AB (ref >59)
GLUCOSE: 225 mg/dL — AB (ref 65–99)
Potassium: 4.7 mmol/L (ref 3.5–5.2)
Sodium: 140 mmol/L (ref 134–144)

## 2013-10-21 ENCOUNTER — Other Ambulatory Visit: Payer: Self-pay | Admitting: Nurse Practitioner

## 2013-10-22 ENCOUNTER — Other Ambulatory Visit (INDEPENDENT_AMBULATORY_CARE_PROVIDER_SITE_OTHER): Payer: BC Managed Care – PPO

## 2013-10-22 DIAGNOSIS — Z79899 Other long term (current) drug therapy: Secondary | ICD-10-CM

## 2013-10-22 DIAGNOSIS — I255 Ischemic cardiomyopathy: Secondary | ICD-10-CM

## 2013-10-22 DIAGNOSIS — I2589 Other forms of chronic ischemic heart disease: Secondary | ICD-10-CM

## 2013-10-22 NOTE — Progress Notes (Signed)
Patient came in for labs for dr hochrein

## 2013-10-23 LAB — BMP8+EGFR
BUN / CREAT RATIO: 25 (ref 11–26)
BUN: 38 mg/dL — AB (ref 8–27)
CO2: 18 mmol/L (ref 18–29)
CREATININE: 1.53 mg/dL — AB (ref 0.57–1.00)
Calcium: 9.4 mg/dL (ref 8.7–10.3)
Chloride: 110 mmol/L — ABNORMAL HIGH (ref 97–108)
GFR calc Af Amer: 40 mL/min/{1.73_m2} — ABNORMAL LOW (ref >59)
GFR calc non Af Amer: 34 mL/min/{1.73_m2} — ABNORMAL LOW (ref >59)
GLUCOSE: 116 mg/dL — AB (ref 65–99)
Potassium: 4.3 mmol/L (ref 3.5–5.2)
Sodium: 147 mmol/L — ABNORMAL HIGH (ref 134–144)

## 2013-11-11 ENCOUNTER — Telehealth: Payer: Self-pay | Admitting: Family Medicine

## 2013-11-14 MED ORDER — DICLOFENAC SODIUM 1 % TD GEL: 2.0000 g | Freq: Four times a day (QID) | TRANSDERMAL | Status: DC

## 2013-11-14 NOTE — Telephone Encounter (Signed)
voltaren gel rx sent to pharmacy. 

## 2013-11-16 ENCOUNTER — Encounter: Payer: Self-pay | Admitting: Cardiology

## 2013-11-16 ENCOUNTER — Ambulatory Visit (INDEPENDENT_AMBULATORY_CARE_PROVIDER_SITE_OTHER): Payer: BC Managed Care – PPO | Admitting: Cardiology

## 2013-11-16 VITALS — BP 140/77 | HR 75 | Ht 66.0 in | Wt 195.0 lb

## 2013-11-16 DIAGNOSIS — I2589 Other forms of chronic ischemic heart disease: Secondary | ICD-10-CM

## 2013-11-16 DIAGNOSIS — I25810 Atherosclerosis of coronary artery bypass graft(s) without angina pectoris: Secondary | ICD-10-CM

## 2013-11-16 MED ORDER — METOPROLOL TARTRATE 50 MG PO TABS: 50.0000 mg | ORAL_TABLET | Freq: Three times a day (TID) | ORAL | Status: DC

## 2013-11-16 NOTE — Patient Instructions (Addendum)
Your physician recommends that you schedule a follow-up appointment in:  2 weeks with Dr.  In Debe Coder  Take Metoprolol 50 mg Three times a day

## 2013-11-16 NOTE — Progress Notes (Signed)
Patient ID: Jo Lynn, female   DOB: 03/13/1945, 68 y.o.   MRN: [id number]    HPI The patient with known an acute inferior myocardial infarction earlier this year during which she received 2 bare-metal stents to a vein graft to obtuse marginal 1 and obtuse marginal 2. She had a resultant ejection fraction of about 20%. Attempted to have pt wear a life vest at discharge but she did not want this. Have had difficulty titrating BP meds in the past given hypotension and Cr.  Pt presents today for f/up. She denies any shortness of breath, PND or orthopnea. She's had no palpitations, presyncope or syncope. She's had no chest pressure, neck or arm discomfort. Doing okay taking plavix. Does not monitor BP at home. Weights 180s-190s.    No Known Allergies  Current Outpatient Prescriptions  Medication Sig Dispense Refill  . aspirin EC 81 MG EC tablet Take 1 tablet (81 mg total) by mouth daily.      Marland Kitchen atorvastatin (LIPITOR) 40 MG tablet TAKE 1 TABLET DAILY  90 tablet  0  . clopidogrel (PLAVIX) 75 MG tablet Take 1 tablet (75 mg total) by mouth daily.  90 tablet  3  . diclofenac sodium (VOLTAREN) 1 % GEL Apply 2 g topically 4 (four) times daily.  5 Tube  3  . furosemide (LASIX) 40 MG tablet Take 40 mg by mouth. Take 1/2 daily      . glyBURIDE (DIABETA) 5 MG tablet Take 5 mg by mouth 2 (two) times daily with a meal.      . lisinopril (PRINIVIL,ZESTRIL) 2.5 MG tablet Take 1 tablet (2.5 mg total) by mouth 2 (two) times daily.  60 tablet  12  . metoprolol (LOPRESSOR) 50 MG tablet Take 25 mg by mouth 3 (three) times daily.        No current facility-administered medications for this visit.     PHYSICAL EXAM BP 140/77  Pulse 75  Ht 5\' 6"  (1.676 m)  Wt 195 lb (88.451 kg)  BMI 31.49 kg/m2 GENERAL:  Well appearing NECK:  No JVD, carotid upstroke brisk and symmetric, no bruits, no thyromegaly LUNGS:  Clear to auscultation bilaterally CHEST:  Well healed sternotomy scar. HEART:  RRR, PMI not displaced  or sustained,S1 and S2 within normal limits, no S3, no S4, no clicks, no rubs, no murmurs ABD:  Flat, positive bowel sounds normal in frequency in pitch, no bruits, no rebound, no guarding, no midline pulsatile mass, no hepatomegaly, no splenomegaly EXT:  DP 2+, no edema, no cyanosis no clubbing SKIN:  No rashes no nodules   ASSESSMENT AND PLAN  CAD: Decision made at last visit to switch to plavix 2/2 cost from [place] and furthermore to continue indefinitely given extensive disease burden.  No bleeding episodes, tolerating well.   CARDIOMYOPATHY:  With EF 20%; Euvolemic today. Prior discussion regarding lifevest and ICD. Both which pt not interested in previously. May now consider ICD if no improvement shown. Will attempt to titrate BB further today to 50mg  TID. At some point would like to repeat ECHO to reassess EF. Potentially next step would be to increase lisinopril if kidney function will allow  CKD: Cr 1.64 (8/19) -> 1.53 (8/28); Tolerated ACE at last visit    History and all data above reviewed.  Patient examined.  I agree with the findings as above. The patient feels well and did OK with the ACE titration.  The patient exam reveals WCH:ENIDP  ,  Lungs: RRR  ,  Abd:  Positive bowel sounds, no rebound no guarding, Ext No edema  .  All available labs, radiology testing, previous records reviewed. Agree with documented assessment and plan.  I will titrate the beta blocker as above.  She is now considering an ICD if her EF does not go up after further med titration.    Jo Lynn  4:02 PM  11/16/2013

## 2013-12-01 ENCOUNTER — Ambulatory Visit (INDEPENDENT_AMBULATORY_CARE_PROVIDER_SITE_OTHER): Payer: BC Managed Care – PPO | Admitting: Cardiology

## 2013-12-01 ENCOUNTER — Encounter: Payer: Self-pay | Admitting: Cardiology

## 2013-12-01 VITALS — BP 124/68 | HR 54 | Ht 66.0 in | Wt 198.0 lb

## 2013-12-01 DIAGNOSIS — I2589 Other forms of chronic ischemic heart disease: Secondary | ICD-10-CM

## 2013-12-01 DIAGNOSIS — I255 Ischemic cardiomyopathy: Secondary | ICD-10-CM

## 2013-12-01 MED ORDER — NITROGLYCERIN 0.4 MG SL SUBL: 0.4000 mg | SUBLINGUAL_TABLET | SUBLINGUAL | Status: DC | PRN

## 2013-12-01 MED ORDER — ENALAPRIL MALEATE 5 MG PO TABS: 5.0000 mg | ORAL_TABLET | Freq: Every day | ORAL | Status: DC

## 2013-12-01 NOTE — Patient Instructions (Addendum)
Your physician recommends that you schedule a follow-up appointment in: one month with Dr. Percival Spanish  We are ordering an Echo to be done in one month  We are ordering some bloodwork to be done in one month

## 2013-12-01 NOTE — Progress Notes (Signed)
HPI The patient presents for follow up after an acute inferior myocardial infarction with 2 bare-metal stents to a vein graft to obtuse marginal 1 and obtuse marginal 2 in July. . She had a resultant ejection fraction of about 20%. We tried to get her to wear a life vest at discharge but she did not want this.   Since discharge she's done well. She denies any palpitations, presyncope or syncope. She's had a little bit of shortness of breath. She's had none of the arm discomfort that was her previous angina. She denies any chest pressure, neck discomfort. She's not having any PND or orthopnea. She's had no palpitations, presyncope or syncope. She has had no weight gain.  We have been titrating her medications and at the last visit we increased her metoprolol. She tolerated this without problems. We have had long discussions about an ICD.   No Known Allergies  Current Outpatient Prescriptions  Medication Sig Dispense Refill  . aspirin EC 81 MG EC tablet Take 1 tablet (81 mg total) by mouth daily.      Marland Kitchen atorvastatin (LIPITOR) 80 MG tablet Take 80 mg by mouth daily.      . clopidogrel (PLAVIX) 75 MG tablet Take 1 tablet (75 mg total) by mouth daily.  90 tablet  3  . diclofenac sodium (VOLTAREN) 1 % GEL Apply 2 g topically 4 (four) times daily.  5 Tube  3  . furosemide (LASIX) 40 MG tablet Take 40 mg by mouth. Take 1/2 daily      . glyBURIDE (DIABETA) 5 MG tablet Take 5 mg by mouth 2 (two) times daily with a meal.      . lisinopril (PRINIVIL,ZESTRIL) 2.5 MG tablet Take 1 tablet (2.5 mg total) by mouth 2 (two) times daily.  60 tablet  12  . metoprolol (LOPRESSOR) 50 MG tablet Take 1 tablet (50 mg total) by mouth 3 (three) times daily.  270 tablet  3   No current facility-administered medications for this visit.    Past Medical History  Diagnosis Date  . Myocardial infarction 10/2002    - MV CAD --> Referred for CABG (no follow-up post CABG)  . CAD, multiple vessel 10/2002    LAD - tandem 80%  prox & mid, Cx- 60% AVG, 70% OM, RCA 90% mid with dRCA occlusion 2 PDAs 80% --> Referred for CABG  . 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 Lucianne Lei Trigt: LIMA-LAD, SVG-RPDA, SVG-OM1-OM2  . ST elevation myocardial infarction (STEMI) of inferolateral wall 09/09/2013    SVG-Om1-2 occluded - PCI in 2 locations; Occluded SVG-RCA & native RCA, LAD & native Cx occcluded.  EF ~20%  . Cardiomyopathy, ischemic 09/09/2013    EF ~20-25%; Large MI, existing occluded RCA & SVG-RCA; h/o CABG  . 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 CAD S/P percutaneous coronary angioplasty 09/09/2013    mid SVG-OM1-OM2 (prox LIMB) Rebel BMS 4.0 mm x 16 mm; sequential limb OM1-OM2: Rebel BMS 4.0 mm x 12 mm  . Combined systolic and diastolic congestive heart failure, NYHA class 3 09/09/2013    Echo post Inferolateral STEMI: Severely reduced LVEF ~20-35%; Akinesis of Inferolateral, Inferior & Inferoseptal walls; Grade 1 DDysfxn (initial LVEDP ~26 mmHg with SBP of 99 mHg)  . Essential hypertension  08/17/2012  . Hyperlipidemia with target LDL less than 70 08/17/2012  . Diabetes mellitus type 2 with complications 06/10/8307    Past Surgical History  Procedure Laterality Date  . Coronary artery bypass graft      ROS:  As stated in the HPI and negative for all other systems.   PHYSICAL EXAM BP 124/68  Pulse 54  Ht 5\' 6"  (1.676 m)  Wt 198 lb (89.812 kg)  BMI 31.97 kg/m2 GENERAL:  Well appearing HEENT:  Pupils equal round and reactive, fundi not visualized, oral mucosa unremarkable NECK:  No jugular venous distention, waveform within normal limits, carotid upstroke brisk and symmetric, no bruits, no thyromegaly LYMPHATICS:  No cervical, inguinal adenopathy LUNGS:  Clear to auscultation bilaterally BACK:  No CVA  tenderness CHEST:  Well healed sternotomy scar. HEART:  PMI not displaced or sustained,S1 and S2 within normal limits, no S3, no S4, no clicks, no rubs, no murmurs ABD:  Flat, positive bowel sounds normal in frequency in pitch, no bruits, no rebound, no guarding, no midline pulsatile mass, no hepatomegaly, no splenomegaly EXT:  2 plus pulses throughout, no edema, no cyanosis no clubbing SKIN:  No rashes no nodules NEURO:  Cranial nerves II through XII grossly intact, motor grossly intact throughout [facility]:  Cognitively intact, oriented to person place and time  EKG:  Sinus bradycardia, rate 53, axis within normal limits, mild QTC prolongation, nonspecific T-wave flattening, old inferior infarct 12/01/2013   ASSESSMENT AND PLAN  CAD:   She is having no symptoms.  No change in therapy is indicated.  We will continue with secondary risk reduction.    CARDIOMYOPATHY:   At this point I Will titrate the lisinopril 5 mg daily. She will get a basic metabolic profile when she comes back in about one month for an echocardiogram. She and her daughter and I agreed that if her ejection fraction remains below 35% she will consent to an EP consultation to consider an ICD.  CKD:   We will watch this as above.

## 2013-12-14 ENCOUNTER — Other Ambulatory Visit: Payer: Self-pay | Admitting: *Deleted

## 2013-12-14 MED ORDER — LISINOPRIL 5 MG PO TABS: 5.0000 mg | ORAL_TABLET | Freq: Every day | ORAL | Status: DC

## 2013-12-17 ENCOUNTER — Ambulatory Visit: Payer: BC Managed Care – PPO | Admitting: Cardiology

## 2013-12-25 ENCOUNTER — Other Ambulatory Visit: Payer: Self-pay | Admitting: Family Medicine

## 2013-12-28 ENCOUNTER — Ambulatory Visit (HOSPITAL_COMMUNITY)
Admission: RE | Admit: 2013-12-28 | Discharge: 2013-12-28 | Disposition: A | Discharge status: Home / Self Care | Payer: BC Managed Care – PPO | Source: Ambulatory Visit | Attending: Cardiovascular Disease | Admitting: Cardiovascular Disease

## 2013-12-28 DIAGNOSIS — I059 Rheumatic mitral valve disease, unspecified: Secondary | ICD-10-CM

## 2013-12-28 DIAGNOSIS — I10 Essential (primary) hypertension: Secondary | ICD-10-CM | POA: Insufficient documentation

## 2013-12-28 DIAGNOSIS — I255 Ischemic cardiomyopathy: Secondary | ICD-10-CM

## 2013-12-28 DIAGNOSIS — E119 Type 2 diabetes mellitus without complications: Secondary | ICD-10-CM | POA: Insufficient documentation

## 2013-12-28 DIAGNOSIS — I428 Other cardiomyopathies: Secondary | ICD-10-CM | POA: Diagnosis present

## 2013-12-28 NOTE — Progress Notes (Signed)
2D Echo Performed 12/28/2013    Marygrace Drought, RCS

## 2013-12-28 NOTE — Telephone Encounter (Signed)
no more refills without being seen  

## 2013-12-28 NOTE — Telephone Encounter (Signed)
No A1C

## 2013-12-29 NOTE — Progress Notes (Signed)
LMTCB

## 2013-12-31 ENCOUNTER — Ambulatory Visit (INDEPENDENT_AMBULATORY_CARE_PROVIDER_SITE_OTHER): Payer: BC Managed Care – PPO | Admitting: Cardiology

## 2013-12-31 ENCOUNTER — Encounter: Payer: Self-pay | Admitting: Cardiology

## 2013-12-31 VITALS — BP 140/80 | HR 58 | Ht 67.0 in | Wt 201.5 lb

## 2013-12-31 DIAGNOSIS — I10 Essential (primary) hypertension: Secondary | ICD-10-CM

## 2013-12-31 DIAGNOSIS — I255 Ischemic cardiomyopathy: Secondary | ICD-10-CM

## 2013-12-31 MED ORDER — LOSARTAN POTASSIUM 50 MG PO TABS: 50.0000 mg | ORAL_TABLET | Freq: Every day | ORAL | Status: DC

## 2013-12-31 NOTE — Patient Instructions (Signed)
Schedule appointment with EP for evaluation for a defibrillator.   Stop Lisinopril  Start Cozaar 50 mg daily  Lab work ( bmet ) in 2 weeks 01/14/14 at [place] office   Your physician recommends that you schedule a follow-up appointment in: 1 month with Dr.Hochrein

## 2013-12-31 NOTE — Progress Notes (Signed)
HPI The patient presents for follow up after an acute inferior myocardial infarction with 2 bare-metal stents to a vein graft to obtuse marginal 1 and obtuse marginal 2 in July. . She had a resultant ejection fraction of about 20%. We tried to get her to wear a life vest at discharge but she did not want this.   Since discharge she's done well. She denies any palpitations, presyncope or syncope. She's had a little bit of shortness of breath particularly with walking. She's had none of the arm discomfort that was her previous angina. She denies any chest pressure, neck discomfort. She's not having any PND or orthopnea. She's had no palpitations, presyncope or syncope. She has had no weight gain.  She has had a mild dry hacking cough.  We have been slowly titrating her meds.  However, her EF remains at 25% on the most recent echocardiogram.      No Known Allergies  Current Outpatient Prescriptions  Medication Sig Dispense Refill  . aspirin EC 81 MG EC tablet Take 1 tablet (81 mg total) by mouth daily.    Marland Kitchen atorvastatin (LIPITOR) 80 MG tablet Take 80 mg by mouth daily.    . clopidogrel (PLAVIX) 75 MG tablet Take 1 tablet (75 mg total) by mouth daily. 90 tablet 3  . diclofenac sodium (VOLTAREN) 1 % GEL Apply 2 g topically 4 (four) times daily. 5 Tube 3  . furosemide (LASIX) 40 MG tablet Take 40 mg by mouth. Take 1/2 daily    . glyBURIDE (DIABETA) 5 MG tablet TAKE 1 TABLET TWICE A DAY 180 tablet 0  . lisinopril (PRINIVIL,ZESTRIL) 5 MG tablet Take 1 tablet (5 mg total) by mouth daily. 90 tablet 3  . metoprolol (LOPRESSOR) 50 MG tablet Take 1 tablet (50 mg total) by mouth 3 (three) times daily. 270 tablet 3  . nitroGLYCERIN (NITROSTAT) 0.4 MG SL tablet Place 1 tablet (0.4 mg total) under the tongue every 5 (five) minutes as needed for chest pain. 90 tablet 3   No current facility-administered medications for this visit.    Past Medical History  Diagnosis Date  . Myocardial infarction 10/2002   - MV CAD --> Referred for CABG (no follow-up post CABG)  . 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 Lucianne Lei Trigt: LIMA-LAD, SVG-RPDA, SVG-OM1-OM2  . ST elevation myocardial infarction (STEMI) of inferolateral wall 09/09/2013    SVG-Om1-2 occluded - PCI in 2 locations; Occluded SVG-RCA & native RCA, LAD & native Cx occcluded.  EF ~20%  . Cardiomyopathy, ischemic 09/09/2013    EF ~20-25%; Large MI, existing occluded RCA & SVG-RCA; h/o CABG  . 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 CAD S/P percutaneous coronary angioplasty 09/09/2013    mid SVG-OM1-OM2 (prox LIMB) Rebel BMS 4.0 mm x 16 mm; sequential limb OM1-OM2: Rebel BMS 4.0 mm x 12 mm  . Combined systolic and diastolic congestive heart failure, NYHA class 3 09/09/2013    Echo post Inferolateral STEMI: Severely reduced LVEF ~20-35%; Akinesis of Inferolateral, Inferior & Inferoseptal walls; Grade 1 DDysfxn (initial LVEDP ~26 mmHg  with SBP of 99 mHg)  . Essential hypertension 08/17/2012  . Hyperlipidemia with target LDL less than 70 08/17/2012  . Diabetes mellitus type 2 with complications 9/45/8592    Past Surgical History  Procedure Laterality Date  . Coronary artery bypass graft      ROS:  As stated in the HPI and negative for all other systems.   PHYSICAL EXAM BP 140/80 mmHg  Pulse 58  Ht 5\' 7"  (1.702 m)  Wt 201 lb 8 oz (91.4 kg)  BMI 31.55 kg/m2 GENERAL:  Well appearing HEENT:  Pupils equal round and reactive, fundi not visualized, oral mucosa unremarkable NECK:  No jugular venous distention, waveform within normal limits, carotid upstroke brisk and symmetric, no bruits, no  thyromegaly LUNGS:  Clear to auscultation bilaterally CHEST:  Well healed sternotomy scar. HEART:  PMI not displaced or sustained,S1 and S2 within normal limits, no S3, no S4, no clicks, no rubs, no murmurs ABD:  Flat, positive bowel sounds normal in frequency in pitch, no bruits, no rebound, no guarding, no midline pulsatile mass, no hepatomegaly, no splenomegaly EXT:  2 plus pulses throughout, no edema, no cyanosis no clubbing SKIN:  No rashes no nodules  ASSESSMENT AND PLAN  CAD:   She is having no symptoms except cough.  No change in therapy is indicated.  We will continue with secondary risk reduction.    CARDIOMYOPATHY:   At this point I will change the lisinopril because of her cough to Cozaar 50 mg daily.  I will check a BMET in 2 weeks.  Her EF is still 25%.  She will get a basic metabolic profile when she comes back in about one month for an echocardiogram. She will consent to an EP consultation to consider an ICD.  I will go ahead and schedule an EP consult.   CKD:   We will watch this as above.  MR:  Moderate.  We will follow this clinically.

## 2014-01-05 ENCOUNTER — Telehealth: Payer: Self-pay | Admitting: Family Medicine

## 2014-01-05 ENCOUNTER — Other Ambulatory Visit: Payer: Self-pay | Admitting: Nurse Practitioner

## 2014-01-05 MED ORDER — GLYBURIDE 5 MG PO TABS
ORAL_TABLET | ORAL | Status: DC
Start: 2014-01-05 — End: 2014-02-17

## 2014-01-05 NOTE — Progress Notes (Signed)
Stp advised to continue taking glyburide per MMM, pt states she is due for her follow up, hasn't been seen in our office since 05/2013, pt given appt with MMM 11/16 @9 :45

## 2014-01-05 NOTE — Telephone Encounter (Signed)
Glyburide she wants to know if she should still take it because of her cardiologist upped some med's and if you do want her to take it she needs refills.

## 2014-01-10 ENCOUNTER — Ambulatory Visit (INDEPENDENT_AMBULATORY_CARE_PROVIDER_SITE_OTHER): Payer: BC Managed Care – PPO | Admitting: *Deleted

## 2014-01-10 ENCOUNTER — Encounter: Payer: Self-pay | Admitting: Nurse Practitioner

## 2014-01-10 ENCOUNTER — Ambulatory Visit (INDEPENDENT_AMBULATORY_CARE_PROVIDER_SITE_OTHER): Payer: BC Managed Care – PPO | Admitting: Nurse Practitioner

## 2014-01-10 ENCOUNTER — Telehealth: Payer: Self-pay | Admitting: Nurse Practitioner

## 2014-01-10 ENCOUNTER — Ambulatory Visit: Payer: BC Managed Care – PPO | Admitting: Cardiology

## 2014-01-10 VITALS — BP 135/80 | HR 88 | Temp 97.7°F | Ht 67.0 in | Wt 197.0 lb

## 2014-01-10 DIAGNOSIS — E118 Type 2 diabetes mellitus with unspecified complications: Secondary | ICD-10-CM

## 2014-01-10 DIAGNOSIS — I255 Ischemic cardiomyopathy: Secondary | ICD-10-CM

## 2014-01-10 DIAGNOSIS — I10 Essential (primary) hypertension: Secondary | ICD-10-CM

## 2014-01-10 DIAGNOSIS — Z23 Encounter for immunization: Secondary | ICD-10-CM

## 2014-01-10 DIAGNOSIS — D508 Other iron deficiency anemias: Secondary | ICD-10-CM

## 2014-01-10 DIAGNOSIS — E119 Type 2 diabetes mellitus without complications: Secondary | ICD-10-CM

## 2014-01-10 DIAGNOSIS — I5043 Acute on chronic combined systolic (congestive) and diastolic (congestive) heart failure: Secondary | ICD-10-CM

## 2014-01-10 DIAGNOSIS — E785 Hyperlipidemia, unspecified: Secondary | ICD-10-CM

## 2014-01-10 LAB — POCT UA - MICROALBUMIN: Microalbumin Ur, POC: 50 mg/L

## 2014-01-10 LAB — POCT GLYCOSYLATED HEMOGLOBIN (HGB A1C): Hemoglobin A1C: 9.2

## 2014-01-10 MED ORDER — SITAGLIPTIN PHOSPHATE 100 MG PO TABS: 100.0000 mg | ORAL_TABLET | Freq: Every day | ORAL | Status: DC

## 2014-01-10 MED ORDER — CLOPIDOGREL BISULFATE 75 MG PO TABS: 75.0000 mg | ORAL_TABLET | Freq: Every day | ORAL | Status: DC

## 2014-01-10 NOTE — Addendum Note (Signed)
Addended by: Rolena Infante on: 01/10/2014 02:54 PM   Modules accepted: Orders

## 2014-01-10 NOTE — Telephone Encounter (Signed)
Pt given appt with MMM@ 10:45, pt aware

## 2014-01-10 NOTE — Patient Instructions (Signed)

## 2014-01-10 NOTE — Progress Notes (Signed)
Subjective:    Patient ID: Jo Lynn, female    DOB: [birth year], 68 y.o.   MRN: [id number]  Patient in today for follow up of chronic medical problems. Patient has had a heart attack with stent placement since last visit- she has a very low ejection fraction. Has really been watching diet since episode.   Hypertension This is a chronic problem. The problem is unchanged. The problem is controlled. Pertinent negatives include no chest pain, headaches, palpitations or shortness of breath. Risk factors for coronary artery disease include stress, dyslipidemia and post-menopausal state. Past treatments include angiotensin blockers, diuretics and beta blockers. The current treatment provides significant improvement. There are no compliance problems.  Hypertensive end-organ damage includes CAD/MI.  Hyperlipidemia This is a chronic problem. The current episode started more than 1 year ago. The problem is uncontrolled. Recent lipid tests were reviewed and are high. Exacerbating diseases include diabetes. She has no history of hypothyroidism or obesity. Pertinent negatives include no chest pain or shortness of breath. Current antihyperlipidemic treatment includes statins. The current treatment provides moderate improvement of lipids. There are no compliance problems.  Risk factors for coronary artery disease include dyslipidemia, family history, hypertension and post-menopausal.  Diabetes She presents for her follow-up diabetic visit. She has type 2 diabetes mellitus. No MedicAlert identification noted. Her disease course has been improving. Pertinent negatives for hypoglycemia include no headaches. Pertinent negatives for diabetes include no chest pain, no polydipsia, no polyphagia, no polyuria, no visual change and no weakness. Symptoms are improving. Diabetic complications include heart disease. Pertinent negatives for diabetic complications include no peripheral neuropathy. Risk factors for coronary artery  disease include stress, obesity, hypertension, dyslipidemia and family history. When asked about current treatments, none were reported. She is compliant with treatment most of the time. Her weight is stable. When asked about meal planning, she reported none. She has not had a previous visit with a dietitian. She rarely participates in exercise. Her breakfast blood glucose is taken between 9-10 am. Her breakfast blood glucose range is generally 180-200 mg/dl. Her overall blood glucose range is 180-200 mg/dl. An ACE inhibitor/angiotensin II receptor blocker is being taken. She does not see a podiatrist.Eye exam is not current.  CAD  Cardiologist increased her lopressor- she is doing well but they want to put a defibrillator but [patient told them no.  Patient encouraged to get done.   Review of Systems  Respiratory: Negative for shortness of breath.   Cardiovascular: Negative for chest pain and palpitations.  Endocrine: Negative for polydipsia, polyphagia and polyuria.  Neurological: Negative for weakness and headaches.  All other systems reviewed and are negative.      Objective:   Physical Exam  Constitutional: She is oriented to person, place, and time. She appears well-developed and well-nourished.  HENT:  Nose: Nose normal.  Mouth/Throat: Oropharynx is clear and moist.  Eyes: EOM are normal.  Neck: Trachea normal, normal range of motion and full passive range of motion without pain. Neck supple. No JVD present. Carotid bruit is not present. No thyromegaly present.  Cardiovascular: Normal rate, regular rhythm, normal heart sounds and intact distal pulses.  Exam reveals no gallop and no friction rub.   No murmur heard. Pulmonary/Chest: Effort normal and breath sounds normal.  Abdominal: Soft. Bowel sounds are normal. She exhibits no distension and no mass. There is no tenderness.  Musculoskeletal: Normal range of motion.  Lymphadenopathy:    She has no cervical adenopathy.   Neurological: She is alert and oriented to  person, place, and time. She has normal reflexes.  Skin: Skin is warm and dry.  Psychiatric: She has a normal mood and affect. Her behavior is normal. Judgment and thought content normal.    BP 135/80 mmHg  Pulse 88  Temp(Src) 97.7 F (36.5 C) (Oral)  Ht $R'5\' 7"'YR$  (1.702 m)  Wt 197 lb (89.359 kg)  BMI 30.85 kg/m2  Results for orders placed or performed in visit on 01/10/14  POCT glycosylated hemoglobin (Hb A1C)  Result Value Ref Range   Hemoglobin A1C 9.2             Assessment & Plan:   1. Type 2 diabetes mellitus without complication strcit low carb diet Added januvia to meds Keep diary of blood sugars - POCT glycosylated hemoglobin (Hb A1C) - POCT UA - Microalbumin - sitaGLIPtin (JANUVIA) 100 MG tablet; Take 1 tablet (100 mg total) by mouth daily.  Dispense: 60 tablet; Refill: 0  2. Essential hypertension Avoid adding Na to diet - CMP14+EGFR  3. Hyperlipidemia Low fat diet - NMR, lipoprofile  4. Acute on chronic combined systolic and diastolic CHF (congestive heart failure) - in setting of recent Large Inferolateral STEMI with existing RCA/SVG-RCA occlusion Keep appointmnet to discuss defibrillator  5. Cardiomyopathy, ischemic - EF ~20-25%; Large MI, existing occluded RCA & SVG-RCA; h/o CABG - clopidogrel (PLAVIX) 75 MG tablet; Take 1 tablet (75 mg total) by mouth daily.  Dispense: 90 tablet; Refill: 3   6. Other iron deficiency anemias Iron supplements   hemoccult cards given to patient with directions Labs pending Health maintenance reviewed Diet and exercise encouraged Continue all meds Follow up  In 1 month- recheck diabetes  [place], FNP

## 2014-01-10 NOTE — Addendum Note (Signed)
Addended by: Earlene Plater on: 01/10/2014 11:30 AM   Modules accepted: Orders

## 2014-01-11 LAB — NMR, LIPOPROFILE
Cholesterol: 229 mg/dL — ABNORMAL HIGH (ref 100–199)
HDL Cholesterol by NMR: 38 mg/dL — ABNORMAL LOW (ref >39)
HDL Particle Number: 19.5 umol/L — ABNORMAL LOW (ref >=30.5)
LDL Particle Number: 1927 nmol/L — ABNORMAL HIGH (ref <1000)
LDL SIZE: 21.6 nm (ref >20.5)
LDL-C: 168 mg/dL — ABNORMAL HIGH (ref 0–99)
LP-IR SCORE: 30 (ref <=45)
Small LDL Particle Number: 541 nmol/L — ABNORMAL HIGH (ref <=527)
Triglycerides by NMR: 114 mg/dL (ref 0–149)

## 2014-01-11 LAB — CMP14+EGFR
ALBUMIN: 4 g/dL (ref 3.6–4.8)
ALT: 29 IU/L (ref 0–32)
AST: 20 IU/L (ref 0–40)
Albumin/Globulin Ratio: 1.5 (ref 1.1–2.5)
Alkaline Phosphatase: 92 IU/L (ref 39–117)
BILIRUBIN TOTAL: 0.5 mg/dL (ref 0.0–1.2)
BUN/Creatinine Ratio: 20 (ref 11–26)
BUN: 26 mg/dL (ref 8–27)
CO2: 17 mmol/L — AB (ref 18–29)
Calcium: 9.2 mg/dL (ref 8.7–10.3)
Chloride: 110 mmol/L — ABNORMAL HIGH (ref 97–108)
Creatinine, Ser: 1.33 mg/dL — ABNORMAL HIGH (ref 0.57–1.00)
GFR calc non Af Amer: 41 mL/min/{1.73_m2} — ABNORMAL LOW (ref >59)
GFR, EST AFRICAN AMERICAN: 47 mL/min/{1.73_m2} — AB (ref >59)
GLUCOSE: 143 mg/dL — AB (ref 65–99)
Globulin, Total: 2.7 g/dL (ref 1.5–4.5)
Potassium: 4.5 mmol/L (ref 3.5–5.2)
Sodium: 145 mmol/L — ABNORMAL HIGH (ref 134–144)
Total Protein: 6.7 g/dL (ref 6.0–8.5)

## 2014-01-11 LAB — SPECIMEN STATUS REPORT

## 2014-01-11 LAB — MICROALBUMIN, URINE: MICROALBUM., U, RANDOM: 45.2 ug/mL — AB (ref 0.0–17.0)

## 2014-01-12 ENCOUNTER — Telehealth: Payer: Self-pay | Admitting: *Deleted

## 2014-01-12 NOTE — Telephone Encounter (Signed)
-----   Message from [facility], [place] sent at 01/12/2014  8:04 AM EST ----- Hgba1c discussed at appointment microalbumin elevated - should improve with better diabetic control- on ARB which should be protective LDL particle numbers and LDL look terrible- make sure take lipitor daily Continue current meds- low fat diet and exercise and recheck in 3 months

## 2014-01-14 ENCOUNTER — Telehealth: Payer: Self-pay

## 2014-01-14 NOTE — Telephone Encounter (Signed)
-----   Message from [facility], [place] sent at 01/12/2014  8:04 AM EST ----- Hgba1c discussed at appointment microalbumin elevated - should improve with better diabetic control- on ARB which should be protective LDL particle numbers and LDL look terrible- make sure take lipitor daily Continue current meds- low fat diet and exercise and recheck in 3 months

## 2014-01-14 NOTE — Telephone Encounter (Signed)
Pt aware of lab results 

## 2014-01-19 ENCOUNTER — Ambulatory Visit (INDEPENDENT_AMBULATORY_CARE_PROVIDER_SITE_OTHER): Payer: BC Managed Care – PPO | Admitting: Internal Medicine

## 2014-01-19 ENCOUNTER — Encounter: Payer: Self-pay | Admitting: *Deleted

## 2014-01-19 ENCOUNTER — Encounter: Payer: Self-pay | Admitting: Internal Medicine

## 2014-01-19 ENCOUNTER — Other Ambulatory Visit: Payer: Self-pay | Admitting: Family Medicine

## 2014-01-19 VITALS — BP 117/69 | HR 67 | Ht 68.0 in | Wt 198.2 lb

## 2014-01-19 DIAGNOSIS — E785 Hyperlipidemia, unspecified: Secondary | ICD-10-CM

## 2014-01-19 DIAGNOSIS — I10 Essential (primary) hypertension: Secondary | ICD-10-CM

## 2014-01-19 DIAGNOSIS — I739 Peripheral vascular disease, unspecified: Secondary | ICD-10-CM | POA: Insufficient documentation

## 2014-01-19 DIAGNOSIS — I5043 Acute on chronic combined systolic (congestive) and diastolic (congestive) heart failure: Secondary | ICD-10-CM

## 2014-01-19 NOTE — Assessment & Plan Note (Signed)
She will continue her statin therapy and maintain a low-fat diet.

## 2014-01-19 NOTE — Progress Notes (Signed)
HPI Jo Lynn is referred today for consideration for ICD implantation. She is a very pleasant 68 year old woman who sustained a myocardial infarction approximately 6 months ago. She is status post percutaneous intervention. Post procedure, she has persistent left ventricular dysfunction, despite maximal medical therapy. She has an ejection fraction of 25% by echo. She has been on diuretic therapy, losartan, and metoprolol. Her blood pressures have been very well controlled. She also complains of problems with discomfort in her legs when she walks. Her heart failure symptoms are class II. Despite all this, she continues to work full time. She has not had syncope. She has minimal peripheral edema. No chest pain.   Current Outpatient Prescriptions  Medication Sig Dispense Refill  . aspirin EC 81 MG EC tablet Take 1 tablet (81 mg total) by mouth daily.    Marland Kitchen atorvastatin (LIPITOR) 40 MG tablet TAKE 1 TABLET DAILY 90 tablet 1  . clopidogrel (PLAVIX) 75 MG tablet Take 1 tablet (75 mg total) by mouth daily. 90 tablet 3  . diclofenac sodium (VOLTAREN) 1 % GEL Apply 2 g topically 4 (four) times daily. 5 Tube 3  . furosemide (LASIX) 40 MG tablet Take 40 mg by mouth. Take 1/2 daily    . glyBURIDE (DIABETA) 5 MG tablet TAKE 1 TABLET TWICE A DAY 180 tablet 0  . losartan (COZAAR) 50 MG tablet Take 1 tablet (50 mg total) by mouth daily. 30 tablet 6  . metoprolol (LOPRESSOR) 50 MG tablet Take 1 tablet (50 mg total) by mouth 3 (three) times daily. 270 tablet 3  . nitroGLYCERIN (NITROSTAT) 0.4 MG SL tablet Place 1 tablet (0.4 mg total) under the tongue every 5 (five) minutes as needed for chest pain. 90 tablet 3  . sitaGLIPtin (JANUVIA) 100 MG tablet Take 1 tablet (100 mg total) by mouth daily. 60 tablet 0   No current facility-administered medications for this visit.     Past Medical History  Diagnosis Date  . Myocardial infarction 10/2002    - MV CAD --> Referred for CABG (no follow-up post CABG)   . 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 Lucianne Lei Trigt: LIMA-LAD, SVG-RPDA, SVG-OM1-OM2  . ST elevation myocardial infarction (STEMI) of inferolateral wall 09/09/2013    SVG-Om1-2 occluded - PCI in 2 locations; Occluded SVG-RCA & native RCA, LAD & native Cx occcluded.  EF ~20%  . Cardiomyopathy, ischemic 09/09/2013    EF ~20-25%; Large MI, existing occluded RCA & SVG-RCA; h/o CABG  . 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 CAD S/P percutaneous coronary angioplasty 09/09/2013    mid SVG-OM1-OM2 (prox LIMB) Rebel BMS 4.0 mm x 16 mm; sequential limb OM1-OM2: Rebel BMS 4.0 mm x 12 mm  . Combined systolic and diastolic congestive heart failure, NYHA class 3 09/09/2013    Echo post Inferolateral STEMI: Severely reduced LVEF ~20-35%; Akinesis of Inferolateral, Inferior & Inferoseptal walls; Grade 1 DDysfxn (initial LVEDP ~26 mmHg with SBP of 99 mHg)  . Essential hypertension 08/17/2012  . Hyperlipidemia with target LDL less than 70 08/17/2012  . Diabetes mellitus  type 2 with complications 0/30/0923    ROS:   All systems reviewed and negative except as noted in the HPI.   Past Surgical History  Procedure Laterality Date  . Coronary artery bypass graft       Family History  Problem Relation Age of Onset  . Diabetes Mother   . Diabetes Father      History   Social History  . Marital Status: Single    Spouse Name: N/A    Number of Children: N/A  . Years of Education: N/A   Occupational History  . Not on file.   Social History Main Topics  . Smoking status: Former Research scientist (life sciences)  . Smokeless tobacco: Not on file  . Alcohol Use: No  .  Drug Use: No  . Sexual Activity: Not on file   Other Topics Concern  . Not on file   Social History Narrative     BP 117/69 mmHg  Pulse 67  Ht 5\' 8"  (1.727 m)  Wt 198 lb 3.2 oz (89.903 kg)  BMI 30.14 kg/m2  Physical Exam:  Stable  appearing  68 year old woman, NAD HEENT: Unremarkable Neck:  7 cm  JVD, no thyromegally Lymphatics:  No adenopathy Back:  No CVA tenderness Lungs:  Clear with no wheezes, rales, or rhonchi.  HEART:  Regular rate rhythm, no murmurs, no rubs, no clicks Abd:  soft, positive bowel sounds, no organomegally, no rebound, no guarding Ext:  2 plus pulses, no edema, no cyanosis, no clubbing Skin:  No rashes no nodules Neuro:  CN II through XII intact, motor grossly intact  EKG - normal sinus rhythm with anterior myocardial infarction   Chest x-ray - reviewed  Assess/Plan:

## 2014-01-19 NOTE — Assessment & Plan Note (Signed)
Her chronic systolic heart failure is maximally treated. She has class II symptoms. She will continue her current medications. I discussed the treatment options with the patient. She has an indication for ICD implantation for primary prevention of malignant cardiac arrhythmias. This be scheduled in the earliest possible convenience time.

## 2014-01-19 NOTE — Assessment & Plan Note (Signed)
Her blood pressure is well controlled. She will continue her current medical therapy, and maintain a low-sodium diet. 

## 2014-01-19 NOTE — Assessment & Plan Note (Signed)
The patient has pain in her legs when she walks. This apparently is a new problem. She is on antiplatelet therapy with aspirin and Plavix. This could be related to a low cardiac output state, but I suspect it has more to do with peripheral vascular disease. We will plan to work this up after she has undergone ICD implantation. She has no evidence of limb threatening ischemia at the present time.

## 2014-01-19 NOTE — Patient Instructions (Signed)
Your physician has recommended that you have a defibrillator inserted. An implantable cardioverter defibrillator (ICD) is a small device that is placed in your chest or, in rare cases, your abdomen. This device uses electrical pulses or shocks to help control life-threatening, irregular heartbeats that could lead the heart to suddenly stop beating (sudden cardiac arrest). Leads are attached to the ICD that goes into your heart. This is done in the hospital and usually requires an overnight stay. Please see the instruction sheet given to you today for more information.  Your physician recommends that you continue on your current medications as directed. Please refer to the Current Medication list given to you today.

## 2014-01-19 NOTE — Addendum Note (Signed)
Addended by: Alvis Lemmings C on: 01/19/2014 05:46 PM   Modules accepted: Orders

## 2014-01-24 ENCOUNTER — Other Ambulatory Visit: Payer: Self-pay | Admitting: *Deleted

## 2014-01-24 DIAGNOSIS — I255 Ischemic cardiomyopathy: Secondary | ICD-10-CM

## 2014-02-03 ENCOUNTER — Encounter (HOSPITAL_COMMUNITY): Payer: Self-pay | Admitting: Cardiology

## 2014-02-08 ENCOUNTER — Encounter: Payer: Self-pay | Admitting: Cardiology

## 2014-02-08 ENCOUNTER — Ambulatory Visit (INDEPENDENT_AMBULATORY_CARE_PROVIDER_SITE_OTHER): Payer: BC Managed Care – PPO | Admitting: Cardiology

## 2014-02-08 VITALS — BP 110/70 | HR 68 | Ht 68.0 in | Wt 198.0 lb

## 2014-02-08 DIAGNOSIS — I25710 Atherosclerosis of autologous vein coronary artery bypass graft(s) with unstable angina pectoris: Secondary | ICD-10-CM

## 2014-02-08 DIAGNOSIS — R600 Localized edema: Secondary | ICD-10-CM

## 2014-02-08 DIAGNOSIS — I10 Essential (primary) hypertension: Secondary | ICD-10-CM

## 2014-02-08 DIAGNOSIS — I255 Ischemic cardiomyopathy: Secondary | ICD-10-CM

## 2014-02-08 MED ORDER — METOPROLOL SUCCINATE ER 100 MG PO TB24: 200.0000 mg | ORAL_TABLET | Freq: Every day | ORAL | Status: DC

## 2014-02-08 NOTE — Patient Instructions (Signed)
Your physician recommends that you schedule a follow-up appointment in: one month with Dr. Percival Spanish  We are going to schedule ABI testing on your next visit in January  We are changing your Metoprolol to Toprol XL 200 mg daily upon your next refill

## 2014-02-08 NOTE — Progress Notes (Signed)
HPI The patient presents for follow up after an acute inferior myocardial infarction with 2 bare-metal stents to a vein graft to obtuse marginal 1 and obtuse marginal 2 in July. . She had a resultant ejection fraction of about 20%. We tried to get her to wear a life vest at discharge but she did not want this.    She has finally been convinced and agreed to get an ICD. At the last visit she was having a dry cough. I switched her from ACE inhibitor to Cozaar but she's still having or cough nonproductive. She's not having a new PND or orthopnea. She has some mild right greater than left lower extremity swelling. Her weight have been stable. She's not adding salt in her diet. She has baseline class II heart failure symptoms. She's not having any chest pressure, neck or arm discomfort. Had no palpitations, presyncope or syncope. She is complaining of some leg pain walking he describes as a tightness.   No Known Allergies  Current Outpatient Prescriptions  Medication Sig Dispense Refill  . aspirin EC 81 MG EC tablet Take 1 tablet (81 mg total) by mouth daily.    Marland Kitchen atorvastatin (LIPITOR) 40 MG tablet TAKE 1 TABLET DAILY 90 tablet 1  . clopidogrel (PLAVIX) 75 MG tablet Take 1 tablet (75 mg total) by mouth daily. 90 tablet 3  . diclofenac sodium (VOLTAREN) 1 % GEL Apply 2 g topically 4 (four) times daily. 5 Tube 3  . furosemide (LASIX) 40 MG tablet Take 40 mg by mouth. Take 1/2 daily    . glyBURIDE (DIABETA) 5 MG tablet TAKE 1 TABLET TWICE A DAY 180 tablet 0  . losartan (COZAAR) 50 MG tablet Take 1 tablet (50 mg total) by mouth daily. 30 tablet 6  . metoprolol (LOPRESSOR) 50 MG tablet Take 1 tablet (50 mg total) by mouth 3 (three) times daily. 270 tablet 3  . nitroGLYCERIN (NITROSTAT) 0.4 MG SL tablet Place 1 tablet (0.4 mg total) under the tongue every 5 (five) minutes as needed for chest pain. 90 tablet 3  . sitaGLIPtin (JANUVIA) 100 MG tablet Take 1 tablet (100 mg total) by mouth daily. 60 tablet 0     No current facility-administered medications for this visit.    Past Medical History  Diagnosis Date  . Myocardial infarction 10/2002    - MV CAD --> Referred for CABG (no follow-up post CABG)  . 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 Lucianne Lei Trigt: LIMA-LAD, SVG-RPDA, SVG-OM1-OM2  . ST elevation myocardial infarction (STEMI) of inferolateral wall 09/09/2013    SVG-Om1-2 occluded - PCI in 2 locations; Occluded SVG-RCA & native RCA, LAD & native Cx occcluded.  EF ~20%  . Cardiomyopathy, ischemic 09/09/2013    EF ~20-25%; Large MI, existing occluded RCA & SVG-RCA; h/o CABG  . 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 CAD S/P percutaneous coronary angioplasty 09/09/2013    mid SVG-OM1-OM2 (prox LIMB) Rebel BMS 4.0 mm x 16 mm; sequential limb OM1-OM2: Rebel BMS 4.0 mm x 12 mm  . Combined systolic and diastolic congestive  heart failure, NYHA class 3 09/09/2013    Echo post Inferolateral STEMI: Severely reduced LVEF ~20-35%; Akinesis of Inferolateral, Inferior & Inferoseptal walls; Grade 1 DDysfxn (initial LVEDP ~26 mmHg with SBP of 99 mHg)  . Essential hypertension 08/17/2012  . Hyperlipidemia with target LDL less than 70 08/17/2012  . Diabetes mellitus type 2 with complications 7/74/1287    Past Surgical History  Procedure Laterality Date  . Coronary artery bypass graft    . Left heart catheterization with coronary angiogram N/A 09/09/2013    Procedure: LEFT HEART CATHETERIZATION WITH CORONARY ANGIOGRAM;  Surgeon: Leonie Man, MD;  Location: [facility] CATH LAB;  Service: Cardiovascular;  Laterality: N/A;    ROS:  As stated in the HPI and  negative for all other systems.   PHYSICAL EXAM BP 110/70 mmHg  Pulse 68  Ht 5\' 8"  (1.727 m)  Wt 198 lb (89.812 kg)  BMI 30.11 kg/m2 GENERAL:  Well appearing HEENT:  Pupils equal round and reactive, fundi not visualized, oral mucosa unremarkable NECK:  No jugular venous distention, waveform within normal limits, carotid upstroke brisk and symmetric, no bruits, no thyromegaly LUNGS:  Clear to auscultation bilaterally CHEST:  Well healed sternotomy scar. HEART:  PMI not displaced or sustained,S1 and S2 within normal limits, no S3, no S4, no clicks, no rubs, no murmurs ABD:  Flat, positive bowel sounds normal in frequency in pitch, no bruits, no rebound, no guarding, no midline pulsatile mass, no hepatomegaly, no splenomegaly EXT:  2 plus pulses throughout, no edema, no cyanosis no clubbing SKIN:  No rashes no nodules  ASSESSMENT AND PLAN  CAD:   She is having no symptoms except cough and probable sinus drainage.  She did not improve with switch from ACE inhibitor to ARB.   No change in therapy is indicated.  We will continue with secondary risk reduction.    CARDIOMYOPATHY:   I will increase Toprol XL to 200 mg daily which she will get at her next refill.  She is to have ICD after Christmas.   CKD:   We will watch this as above.  Her creat was stable in mid Nov.   MR:  Moderate.  We will follow this clinically.   It was moderate on the echo last month.    LEG PAIN:  Possible claudication she will get a ABIs on her return visit.

## 2014-02-11 ENCOUNTER — Ambulatory Visit (INDEPENDENT_AMBULATORY_CARE_PROVIDER_SITE_OTHER): Payer: BC Managed Care – PPO | Admitting: Nurse Practitioner

## 2014-02-11 ENCOUNTER — Encounter: Payer: Self-pay | Admitting: Nurse Practitioner

## 2014-02-11 VITALS — BP 112/72 | HR 67 | Temp 97.4°F | Ht 68.0 in | Wt 198.0 lb

## 2014-02-11 DIAGNOSIS — E118 Type 2 diabetes mellitus with unspecified complications: Secondary | ICD-10-CM

## 2014-02-11 LAB — POCT GLYCOSYLATED HEMOGLOBIN (HGB A1C): Hemoglobin A1C: 8

## 2014-02-11 MED ORDER — BENZONATATE 100 MG PO CAPS: 100.0000 mg | ORAL_CAPSULE | Freq: Two times a day (BID) | ORAL | Status: DC | PRN

## 2014-02-11 NOTE — Progress Notes (Signed)
Subjective:    Patient ID: Jo Lynn, female    DOB: 1945/05/05, 68 y.o.   MRN: [id number]  Patient in today for follow up of diabetes. Patient has had a heart attack with stent placement - she has a very low ejection fraction. Has really been watching diet since episode. hgba1c was 9.2% and [place] was added.  Hypertension This is a chronic problem. The problem is unchanged. The problem is controlled. Pertinent negatives include no chest pain, headaches, palpitations or shortness of breath. Risk factors for coronary artery disease include stress, dyslipidemia and post-menopausal state. Past treatments include angiotensin blockers, diuretics and beta blockers. The current treatment provides significant improvement. There are no compliance problems.  Hypertensive end-organ damage includes CAD/MI.  Hyperlipidemia This is a chronic problem. The current episode started more than 1 year ago. The problem is uncontrolled. Recent lipid tests were reviewed and are high. Exacerbating diseases include diabetes. She has no history of hypothyroidism or obesity. Pertinent negatives include no chest pain or shortness of breath. Current antihyperlipidemic treatment includes statins. The current treatment provides moderate improvement of lipids. There are no compliance problems.  Risk factors for coronary artery disease include dyslipidemia, family history, hypertension and post-menopausal.  Diabetes She presents for her follow-up diabetic visit. She has type 2 diabetes mellitus. No MedicAlert identification noted. Her disease course has been improving. Pertinent negatives for hypoglycemia include no headaches. Pertinent negatives for diabetes include no chest pain, no polydipsia, no polyphagia, no polyuria, no visual change and no weakness. Symptoms are improving. Diabetic complications include heart disease. Pertinent negatives for diabetic complications include no peripheral neuropathy. Risk factors for coronary  artery disease include stress, obesity, hypertension, dyslipidemia and family history. When asked about current treatments, none were reported. She is compliant with treatment most of the time. Her weight is stable. When asked about meal planning, she reported none. She has not had a previous visit with a dietitian. She rarely participates in exercise. Her breakfast blood glucose is taken between 9-10 am. Her breakfast blood glucose range is generally 180-200 mg/dl. Her overall blood glucose range is 180-200 mg/dl. An ACE inhibitor/angiotensin II receptor blocker is being taken. She does not see a podiatrist.Eye exam is not current.  CAD  Cardiologist increased her lopressor- she is doing well but they want to put a defibrillator but [patient told them no.  Patient encouraged to get done.   Review of Systems  Respiratory: Negative for shortness of breath.   Cardiovascular: Negative for chest pain and palpitations.  Endocrine: Negative for polydipsia, polyphagia and polyuria.  Neurological: Negative for weakness and headaches.  All other systems reviewed and are negative.      Objective:   Physical Exam  Constitutional: She is oriented to person, place, and time. She appears well-developed and well-nourished.  HENT:  Nose: Nose normal.  Mouth/Throat: Oropharynx is clear and moist.  Eyes: EOM are normal.  Neck: Trachea normal, normal range of motion and full passive range of motion without pain. Neck supple. No JVD present. Carotid bruit is not present. No thyromegaly present.  Cardiovascular: Normal rate, regular rhythm, normal heart sounds and intact distal pulses.  Exam reveals no gallop and no friction rub.   No murmur heard. Pulmonary/Chest: Effort normal and breath sounds normal.  Abdominal: Soft. Bowel sounds are normal. She exhibits no distension and no mass. There is no tenderness.  Musculoskeletal: Normal range of motion.  Lymphadenopathy:    She has no cervical adenopathy.   Neurological: She is alert and  oriented to person, place, and time. She has normal reflexes.  Skin: Skin is warm and dry.  Psychiatric: She has a normal mood and affect. Her behavior is normal. Judgment and thought content normal.    BP 112/72 mmHg  Pulse 67  Temp(Src) 97.4 F (36.3 C) (Oral)  Ht 5\' 8"  (1.727 m)  Wt 198 lb (89.812 kg)  BMI 30.11 kg/m2  Results for orders placed or performed in visit on 02/11/14  POCT glycosylated hemoglobin (Hb A1C)  Result Value Ref Range   Hemoglobin A1C 8.0               Assessment & Plan:   1. Diabetes mellitus type 2 with complications    Continue all meds LAbs are improving Continue carb counting  Mary-Margaret Hassell Done, FNP

## 2014-02-12 ENCOUNTER — Other Ambulatory Visit: Payer: Self-pay | Admitting: *Deleted

## 2014-02-12 MED ORDER — BENZONATATE 100 MG PO CAPS: 100.0000 mg | ORAL_CAPSULE | Freq: Two times a day (BID) | ORAL | Status: DC | PRN

## 2014-02-14 ENCOUNTER — Other Ambulatory Visit: Payer: BC Managed Care – PPO

## 2014-02-14 ENCOUNTER — Emergency Department (HOSPITAL_COMMUNITY): Payer: BC Managed Care – PPO

## 2014-02-14 ENCOUNTER — Encounter (HOSPITAL_COMMUNITY): Payer: Self-pay | Admitting: *Deleted

## 2014-02-14 ENCOUNTER — Emergency Department (HOSPITAL_COMMUNITY)
Admission: EM | Admit: 2014-02-14 | Discharge: 2014-02-14 | Disposition: A | Discharge status: Home / Self Care | Payer: BC Managed Care – PPO | Attending: Emergency Medicine | Admitting: Emergency Medicine

## 2014-02-14 DIAGNOSIS — R1110 Vomiting, unspecified: Secondary | ICD-10-CM | POA: Diagnosis not present

## 2014-02-14 DIAGNOSIS — Z7982 Long term (current) use of aspirin: Secondary | ICD-10-CM | POA: Diagnosis not present

## 2014-02-14 DIAGNOSIS — E785 Hyperlipidemia, unspecified: Secondary | ICD-10-CM | POA: Insufficient documentation

## 2014-02-14 DIAGNOSIS — I2510 Atherosclerotic heart disease of native coronary artery without angina pectoris: Secondary | ICD-10-CM | POA: Diagnosis not present

## 2014-02-14 DIAGNOSIS — I10 Essential (primary) hypertension: Secondary | ICD-10-CM | POA: Diagnosis not present

## 2014-02-14 DIAGNOSIS — R059 Cough, unspecified: Secondary | ICD-10-CM

## 2014-02-14 DIAGNOSIS — R197 Diarrhea, unspecified: Secondary | ICD-10-CM | POA: Insufficient documentation

## 2014-02-14 DIAGNOSIS — R05 Cough: Secondary | ICD-10-CM

## 2014-02-14 DIAGNOSIS — J189 Pneumonia, unspecified organism: Secondary | ICD-10-CM

## 2014-02-14 DIAGNOSIS — Z9861 Coronary angioplasty status: Secondary | ICD-10-CM | POA: Diagnosis not present

## 2014-02-14 DIAGNOSIS — I252 Old myocardial infarction: Secondary | ICD-10-CM | POA: Diagnosis not present

## 2014-02-14 DIAGNOSIS — Z7902 Long term (current) use of antithrombotics/antiplatelets: Secondary | ICD-10-CM | POA: Insufficient documentation

## 2014-02-14 DIAGNOSIS — I25710 Atherosclerosis of autologous vein coronary artery bypass graft(s) with unstable angina pectoris: Secondary | ICD-10-CM | POA: Insufficient documentation

## 2014-02-14 DIAGNOSIS — I5040 Unspecified combined systolic (congestive) and diastolic (congestive) heart failure: Secondary | ICD-10-CM | POA: Insufficient documentation

## 2014-02-14 DIAGNOSIS — E118 Type 2 diabetes mellitus with unspecified complications: Secondary | ICD-10-CM | POA: Diagnosis not present

## 2014-02-14 DIAGNOSIS — Z79899 Other long term (current) drug therapy: Secondary | ICD-10-CM | POA: Insufficient documentation

## 2014-02-14 DIAGNOSIS — J159 Unspecified bacterial pneumonia: Secondary | ICD-10-CM | POA: Insufficient documentation

## 2014-02-14 DIAGNOSIS — Z87891 Personal history of nicotine dependence: Secondary | ICD-10-CM | POA: Insufficient documentation

## 2014-02-14 DIAGNOSIS — Z951 Presence of aortocoronary bypass graft: Secondary | ICD-10-CM | POA: Insufficient documentation

## 2014-02-14 MED ORDER — AZITHROMYCIN 250 MG PO TABS: 250.0000 mg | ORAL_TABLET | Freq: Every day | ORAL | Status: DC

## 2014-02-14 MED ORDER — ALBUTEROL SULFATE HFA 108 (90 BASE) MCG/ACT IN AERS
2.0000 | INHALATION_SPRAY | Freq: Once | RESPIRATORY_TRACT | Status: AC
  Administered 2014-02-14: 2 via RESPIRATORY_TRACT
  Filled 2014-02-14: qty 6.7

## 2014-02-14 MED ORDER — DIPHENHYDRAMINE HCL 25 MG PO CAPS
25.0000 mg | ORAL_CAPSULE | Freq: Once | ORAL | Status: AC
  Administered 2014-02-14: 25 mg via ORAL
  Filled 2014-02-14: qty 1

## 2014-02-14 MED ORDER — AZITHROMYCIN 250 MG PO TABS
500.0000 mg | ORAL_TABLET | Freq: Once | ORAL | Status: AC
  Administered 2014-02-14: 500 mg via ORAL
  Filled 2014-02-14: qty 2

## 2014-02-14 NOTE — Discharge Instructions (Signed)
Pneumonia, Adult  Jo Lynn, you were seen today for cough, your chest x-ray reveals a pneumonia. Take antibiotics as prescribed and follow-up with your primary physician within 3 days for continued management. If symptoms worsen, including fever or worsening cough come back to emergency department immediately for repeat evaluation. Thank you. Pneumonia is an infection of the lungs. It may be caused by a germ (virus or bacteria). Some types of pneumonia can spread easily from person to person. This can happen when you cough or sneeze. HOME CARE  Only take medicine as told by your doctor.  Take your medicine (antibiotics) as told. Finish it even if you start to feel better.  Do not smoke.  You may use a vaporizer or humidifier in your room. This can help loosen thick spit (mucus).  Sleep so you are almost sitting up (semi-upright). This helps reduce coughing.  Rest. A shot (vaccine) can help prevent pneumonia. Shots are often advised for:  People over 77 years old.  Patients on chemotherapy.  People with long-term (chronic) lung problems.  People with immune system problems. GET HELP RIGHT AWAY IF:   You are getting worse.  You cannot control your cough, and you are losing sleep.  You cough up blood.  Your pain gets worse, even with medicine.  You have a fever.  Any of your problems are getting worse, not better.  You have shortness of breath or chest pain. MAKE SURE YOU:   Understand these instructions.  Will watch your condition.  Will get help right away if you are not doing well or get worse. Document Released: 07/31/2007 Document Revised: 05/06/2011 Document Reviewed: 05/04/2010 [facility] Patient Information 2015 [place], [place]. This information is not intended to replace advice given to you by your health care provider. Make sure you discuss any questions you have with your health care provider.

## 2014-02-14 NOTE — ED Notes (Signed)
Cough and weakness for 2 weeks.  Unable to sleep  At night due to coiughing

## 2014-02-14 NOTE — ED Provider Notes (Signed)
CSN: [id number]     Arrival date & time 02/14/14  0148 History  This chart was scribed for Everlene Balls, MD by Marlowe Kays, ED Scribe. This patient was seen in room A10C/A10C and the patient's care was started at 2:31 AM.  Chief Complaint  Patient presents with  . Cough   Patient is a 68 y.o. female presenting with cough. The history is provided by the patient. No language interpreter was used.  Cough Associated symptoms: no chills, no diaphoresis, no fever and no rhinorrhea     HPI Comments:  Jo Lynn is a 68 y.o. female who presents to the Emergency Department complaining of severe, dry, nonproductive cough (keeps her awake at night) for the past two weeks. She reports associated rhinorrhea. Pt reports orthopnea for the past three days. Daughter reports that she had two episodes of post-tussive emesis yesterday and diarrhea yesterday. Daughter also reports the pt was confused yesterday and reports that her CBG level may have been down. She states she was seen by her PCP regarding her cough and was prescribed Benzonatate but states it "doesn't agree" with her. Pt reports taking Coricidin and her normal ASA this morning but denies taking anything else. Denies sick contacts. Denies modifying factors. Denies fever, chills, diaphoresis or hematochezia. PMH of CAD, MI, HTN, HLD and DM. Past surgical h/o CABG.   Past Medical History  Diagnosis Date  . Myocardial infarction 10/2002    - MV CAD --> Referred for CABG (no follow-up post CABG)  . 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 Lucianne Lei Trigt: LIMA-LAD, SVG-RPDA, SVG-OM1-OM2  . ST elevation myocardial infarction (STEMI) of inferolateral wall 09/09/2013    SVG-Om1-2 occluded - PCI in 2 locations; Occluded SVG-RCA & native RCA, LAD & native Cx occcluded.  EF ~20%  . Cardiomyopathy, ischemic 09/09/2013    EF  ~20-25%; Large MI, existing occluded RCA & SVG-RCA; h/o CABG  . 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 CAD S/P percutaneous coronary angioplasty 09/09/2013    mid SVG-OM1-OM2 (prox LIMB) Rebel BMS 4.0 mm x 16 mm; sequential limb OM1-OM2: Rebel BMS 4.0 mm x 12 mm  . Combined systolic and diastolic congestive heart failure, NYHA class 3 09/09/2013    Echo post Inferolateral STEMI: Severely reduced LVEF ~20-35%; Akinesis of Inferolateral, Inferior & Inferoseptal walls; Grade 1 DDysfxn (initial LVEDP ~26 mmHg with SBP of 99 mHg)  . Essential hypertension 08/17/2012  . Hyperlipidemia with target LDL less than 70 08/17/2012  . Diabetes mellitus type 2 with complications 7/65/4650   Past Surgical History  Procedure Laterality Date  . Coronary artery bypass graft    . Left heart catheterization with coronary angiogram N/A 09/09/2013    Procedure: LEFT HEART CATHETERIZATION WITH CORONARY ANGIOGRAM;  Surgeon: Leonie Man, MD;  Location: [facility] CATH LAB;  Service: Cardiovascular;  Laterality: N/A;   Family History  Problem Relation Age of Onset  . Diabetes Mother   . Diabetes Father    History  Substance Use Topics  . Smoking status: Former Research scientist (life sciences)  . Smokeless tobacco: Not on file  .  Alcohol Use: No   OB History    No data available     Review of Systems  Constitutional: Negative for fever, chills and diaphoresis.  HENT: Negative for rhinorrhea.   Respiratory: Positive for cough.   Gastrointestinal: Positive for vomiting and diarrhea. Negative for blood in stool.  All other systems reviewed and are negative.   Allergies  Review of patient's allergies indicates no known allergies.  Home Medications   Prior to Admission medications   Medication Sig Start Date End  Date Taking? Authorizing Provider  aspirin EC 81 MG EC tablet Take 1 tablet (81 mg total) by mouth daily. 09/14/13  Yes Almyra Deforest, PA  atorvastatin (LIPITOR) 40 MG tablet TAKE 1 TABLET DAILY 01/19/14  Yes Mary-Margaret Hassell Done, FNP  benzonatate (TESSALON) 100 MG capsule Take 1 capsule (100 mg total) by mouth 2 (two) times daily as needed for cough. 02/12/14  Yes Mary-Margaret Hassell Done, FNP  clopidogrel (PLAVIX) 75 MG tablet Take 1 tablet (75 mg total) by mouth daily. 01/10/14  Yes Mary-Margaret Hassell Done, FNP  diclofenac sodium (VOLTAREN) 1 % GEL Apply 2 g topically 4 (four) times daily. 11/14/13  Yes Mary-Margaret Hassell Done, FNP  furosemide (LASIX) 40 MG tablet Take 20 mg by mouth daily.  09/14/13  Yes Almyra Deforest, PA  glyBURIDE (DIABETA) 5 MG tablet TAKE 1 TABLET TWICE A DAY Patient taking differently: Take 5 mg by mouth 2 (two) times daily with a meal.  01/05/14  Yes Mary-Margaret Hassell Done, FNP  losartan (COZAAR) 50 MG tablet Take 1 tablet (50 mg total) by mouth daily. 12/31/13  Yes Minus Breeding, MD  metoprolol (LOPRESSOR) 50 MG tablet Take 50 mg by mouth 3 (three) times daily.   Yes Historical Provider, MD  nitroGLYCERIN (NITROSTAT) 0.4 MG SL tablet Place 1 tablet (0.4 mg total) under the tongue every 5 (five) minutes as needed for chest pain. 12/01/13  Yes Minus Breeding, MD  sitaGLIPtin (JANUVIA) 100 MG tablet Take 1 tablet (100 mg total) by mouth daily. 01/10/14  Yes Mary-Margaret Hassell Done, FNP  metoprolol succinate (TOPROL-XL) 100 MG 24 hr tablet Take 2 tablets (200 mg total) by mouth daily. Take with or immediately following a meal. Patient not taking: Reported on 02/14/2014 02/08/14   Minus Breeding, MD   Triage Vitals: BP 128/72 mmHg  Pulse 87  Temp(Src) 97.3 F (36.3 C) (Oral)  Resp 14  SpO2 100% Physical Exam  Constitutional: She is oriented to person, place, and time. She appears well-developed and well-nourished. No distress.  HENT:  Head: Normocephalic and atraumatic.  Eyes: Conjunctivae and  EOM are normal. Pupils are equal, round, and reactive to light. No scleral icterus.  Neck: Normal range of motion. Neck supple. No JVD present. No tracheal deviation present. No thyromegaly present.  Cardiovascular: Normal rate, regular rhythm and normal heart sounds.  Exam reveals no gallop and no friction rub.   No murmur heard. Pulmonary/Chest: Effort normal and breath sounds normal. No respiratory distress. She has no wheezes. She exhibits no tenderness.  Frequent cough.  Abdominal: Soft. Bowel sounds are normal. She exhibits no distension and no mass. There is no tenderness. There is no rebound and no guarding.  Musculoskeletal: Normal range of motion. She exhibits no edema or tenderness.  Lymphadenopathy:    She has no cervical adenopathy.  Neurological: She is alert and oriented to person, place, and time.  Skin: Skin is warm and dry. No rash noted. No erythema. No pallor.  Nursing note and vitals reviewed.   ED Course  Procedures (including critical care time) DIAGNOSTIC STUDIES: Oxygen Saturation is 100% on RA, normal by my interpretation.   COORDINATION OF CARE: 2:38 AM- Will order CXR and Ventolin MDI. Pt verbalizes understanding and agrees to plan.  Medications  albuterol (PROVENTIL HFA;VENTOLIN HFA) 108 (90 BASE) MCG/ACT inhaler 2 puff (not administered)    Labs Review Labs Reviewed - No data to display  Imaging Review Dg Chest 2 View  02/14/2014   CLINICAL DATA:  Severe dry nonproductive cough for 2 weeks, altered mental status for 2 days.  EXAM: CHEST  2 VIEW  COMPARISON:  Chest radiograph September 10, 2013  FINDINGS: Cardiac silhouette appears moderately enlarged, mediastinal silhouette is nonsuspicious. Patchy RIGHT lower lobe airspace opacity with small RIGHT pleural effusion. RIGHT midlung zone focal airspace opacities. Mild pulmonary vascular congestion. Rounded density in RIGHT hilum suggests vascular shadows. Status post median sternotomy for CABG. No pneumothorax.  Soft tissue planes and included osseous structures are nonsuspicious, moderate to severe mid thoracic degenerative discs.  IMPRESSION: RIGHT midlung zone and RIGHT lower lobe patchy airspace opacities may reflect pneumonia with small RIGHT pleural effusion. Recommend followup chest radiograph after treatment to verify improvement.  Cardiomegaly, mild pulmonary vascular congestion.   Electronically Signed   By: Elon Alas   On: 02/14/2014 03:43     EKG Interpretation None      MDM   Final diagnoses:  None    Patient presents emergency department out of concern for a cough. Chest x-ray reveals a right mid lung pneumonia. Patient was given azithromycin emergency department will be sent home with a prescription. Strict return precautions were given, her vital signs remain within her normal limits and she is safe for discharge.  She is not tachypneic, appears comfortable in no acute distress, and is in no respiratory distress.  I personally performed the services described in this documentation, which was scribed in my presence. The recorded information has been reviewed and is accurate.    Everlene Balls, MD 02/14/14 [phone]

## 2014-02-14 NOTE — ED Notes (Signed)
Pt a/o x 4 on d/c with steady gait. 

## 2014-02-14 NOTE — ED Notes (Signed)
Patient transported to X-ray 

## 2014-02-15 ENCOUNTER — Inpatient Hospital Stay (HOSPITAL_COMMUNITY)
Admission: EM | Admit: 2014-02-15 | Discharge: 2014-02-17 | DRG: 194 | Disposition: A | Discharge status: Home / Self Care | Payer: BC Managed Care – PPO | Attending: Internal Medicine | Admitting: Internal Medicine

## 2014-02-15 ENCOUNTER — Telehealth: Payer: Self-pay | Admitting: Internal Medicine

## 2014-02-15 ENCOUNTER — Other Ambulatory Visit: Payer: Self-pay

## 2014-02-15 ENCOUNTER — Encounter (HOSPITAL_COMMUNITY): Payer: Self-pay | Admitting: Emergency Medicine

## 2014-02-15 ENCOUNTER — Emergency Department (HOSPITAL_COMMUNITY): Payer: BC Managed Care – PPO

## 2014-02-15 DIAGNOSIS — R748 Abnormal levels of other serum enzymes: Secondary | ICD-10-CM

## 2014-02-15 DIAGNOSIS — E118 Type 2 diabetes mellitus with unspecified complications: Secondary | ICD-10-CM | POA: Diagnosis present

## 2014-02-15 DIAGNOSIS — N179 Acute kidney failure, unspecified: Secondary | ICD-10-CM | POA: Diagnosis present

## 2014-02-15 DIAGNOSIS — E11649 Type 2 diabetes mellitus with hypoglycemia without coma: Secondary | ICD-10-CM | POA: Diagnosis present

## 2014-02-15 DIAGNOSIS — E785 Hyperlipidemia, unspecified: Secondary | ICD-10-CM | POA: Diagnosis present

## 2014-02-15 DIAGNOSIS — J189 Pneumonia, unspecified organism: Principal | ICD-10-CM | POA: Diagnosis present

## 2014-02-15 DIAGNOSIS — Z87891 Personal history of nicotine dependence: Secondary | ICD-10-CM

## 2014-02-15 DIAGNOSIS — I129 Hypertensive chronic kidney disease with stage 1 through stage 4 chronic kidney disease, or unspecified chronic kidney disease: Secondary | ICD-10-CM | POA: Diagnosis present

## 2014-02-15 DIAGNOSIS — I255 Ischemic cardiomyopathy: Secondary | ICD-10-CM | POA: Diagnosis present

## 2014-02-15 DIAGNOSIS — K8020 Calculus of gallbladder without cholecystitis without obstruction: Secondary | ICD-10-CM | POA: Diagnosis present

## 2014-02-15 DIAGNOSIS — R7401 Elevation of levels of liver transaminase levels: Secondary | ICD-10-CM | POA: Diagnosis present

## 2014-02-15 DIAGNOSIS — Z9861 Coronary angioplasty status: Secondary | ICD-10-CM

## 2014-02-15 DIAGNOSIS — E860 Dehydration: Secondary | ICD-10-CM | POA: Diagnosis present

## 2014-02-15 DIAGNOSIS — I2510 Atherosclerotic heart disease of native coronary artery without angina pectoris: Secondary | ICD-10-CM | POA: Diagnosis present

## 2014-02-15 DIAGNOSIS — R740 Nonspecific elevation of levels of transaminase and lactic acid dehydrogenase [LDH]: Secondary | ICD-10-CM | POA: Diagnosis present

## 2014-02-15 DIAGNOSIS — Z951 Presence of aortocoronary bypass graft: Secondary | ICD-10-CM

## 2014-02-15 DIAGNOSIS — R0602 Shortness of breath: Secondary | ICD-10-CM

## 2014-02-15 DIAGNOSIS — I10 Essential (primary) hypertension: Secondary | ICD-10-CM | POA: Diagnosis present

## 2014-02-15 DIAGNOSIS — I5042 Chronic combined systolic (congestive) and diastolic (congestive) heart failure: Secondary | ICD-10-CM | POA: Diagnosis present

## 2014-02-15 DIAGNOSIS — I252 Old myocardial infarction: Secondary | ICD-10-CM

## 2014-02-15 DIAGNOSIS — N183 Chronic kidney disease, stage 3 (moderate): Secondary | ICD-10-CM | POA: Diagnosis present

## 2014-02-15 DIAGNOSIS — I200 Unstable angina: Secondary | ICD-10-CM | POA: Diagnosis present

## 2014-02-15 DIAGNOSIS — Z7982 Long term (current) use of aspirin: Secondary | ICD-10-CM

## 2014-02-15 LAB — COMPREHENSIVE METABOLIC PANEL
ALBUMIN: 3.5 g/dL (ref 3.5–5.2)
ALK PHOS: 212 U/L — AB (ref 39–117)
ALT: 800 U/L — ABNORMAL HIGH (ref 0–35)
AST: 428 U/L — ABNORMAL HIGH (ref 0–37)
Anion gap: 12 (ref 5–15)
BUN: 44 mg/dL — AB (ref 6–23)
CO2: 18 mmol/L — ABNORMAL LOW (ref 19–32)
Calcium: 9.2 mg/dL (ref 8.4–10.5)
Chloride: 111 mEq/L (ref 96–112)
Creatinine, Ser: 2.42 mg/dL — ABNORMAL HIGH (ref 0.50–1.10)
GFR calc Af Amer: 22 mL/min — ABNORMAL LOW (ref >90)
GFR calc non Af Amer: 19 mL/min — ABNORMAL LOW (ref >90)
Glucose, Bld: 260 mg/dL — ABNORMAL HIGH (ref 70–99)
POTASSIUM: 4.4 mmol/L (ref 3.5–5.1)
Sodium: 141 mmol/L (ref 135–145)
TOTAL PROTEIN: 6.4 g/dL (ref 6.0–8.3)
Total Bilirubin: 1.4 mg/dL — ABNORMAL HIGH (ref 0.3–1.2)

## 2014-02-15 LAB — CBC WITH DIFFERENTIAL/PLATELET
BASOS ABS: 0 10*3/uL (ref 0.0–0.1)
Basophils Relative: 0 % (ref 0–1)
EOS ABS: 0 10*3/uL (ref 0.0–0.7)
Eosinophils Relative: 0 % (ref 0–5)
HCT: 34.9 % — ABNORMAL LOW (ref 36.0–46.0)
Hemoglobin: 11.3 g/dL — ABNORMAL LOW (ref 12.0–15.0)
Lymphocytes Relative: 13 % (ref 12–46)
Lymphs Abs: 1.3 10*3/uL (ref 0.7–4.0)
MCH: 28.3 pg (ref 26.0–34.0)
MCHC: 32.4 g/dL (ref 30.0–36.0)
MCV: 87.3 fL (ref 78.0–100.0)
MONOS PCT: 7 % (ref 3–12)
Monocytes Absolute: 0.7 10*3/uL (ref 0.1–1.0)
NEUTROS PCT: 80 % — AB (ref 43–77)
Neutro Abs: 8.1 10*3/uL — ABNORMAL HIGH (ref 1.7–7.7)
PLATELETS: 178 10*3/uL (ref 150–400)
RBC: 4 MIL/uL (ref 3.87–5.11)
RDW: 15.8 % — AB (ref 11.5–15.5)
WBC: 10.2 10*3/uL (ref 4.0–10.5)

## 2014-02-15 LAB — TROPONIN I: Troponin I: <0.03 ng/mL (ref <0.031)

## 2014-02-15 LAB — BRAIN NATRIURETIC PEPTIDE: B Natriuretic Peptide: 4826.4 pg/mL — ABNORMAL HIGH (ref 0.0–100.0)

## 2014-02-15 MED ORDER — DEXTROSE 5 % IV SOLN: 1.0000 g | Freq: Once | INTRAVENOUS | Status: DC

## 2014-02-15 MED ORDER — CEFTRIAXONE SODIUM 1 G IJ SOLR
1.0000 g | Freq: Once | INTRAMUSCULAR | Status: AC
  Administered 2014-02-15: 1 g via INTRAMUSCULAR
  Filled 2014-02-15: qty 10

## 2014-02-15 MED ORDER — ONDANSETRON 4 MG PO TBDP
4.0000 mg | ORAL_TABLET | Freq: Once | ORAL | Status: AC
  Administered 2014-02-15: 4 mg via ORAL
  Filled 2014-02-15: qty 1

## 2014-02-15 NOTE — ED Provider Notes (Signed)
Patient presented to the ER with increasing cough. Patient was seen in the ER yesterday and diagnosed with pneumonia, started on Zithromax. She reports that she has had increased cough and congestion since yesterday. She has not had any fever.  Face to face Exam: HEENT - PERRLA Lungs - CTAB Heart - RRR, no M/R/G Abd - S/NT/ND Neuro - alert, oriented x3  Plan: Repeat x-ray shows worsening pneumonia. Will add Rocephin. Remainder of blood work reveals elevated LFTs. She is not experiencing abdominal pain and is not tender. Will obtain ultrasound to further evaluate, will possibly need GI consultation.   Orpah Greek, MD 02/15/14 [phone]

## 2014-02-15 NOTE — ED Notes (Addendum)
Pt. reports persistent dry cough , orthopnea and exertional dyspnea onset yesterday , denies fever or chills , currently taking Azithromax oral antibiotic for pneumonia. Chest x-ray done yesterday.

## 2014-02-15 NOTE — Telephone Encounter (Signed)
Spoke with patient's daughter.  She will be finished with her antibiotic Thurs 02/17/14 and as long as her cough is better and still afebrile okay to proceed with PPM implant.  Will forward to Dr Lovena Le to make sure this is okay.  She will have her pr-op labs tomorrow

## 2014-02-15 NOTE — ED Notes (Signed)
Patient transported to X-ray 

## 2014-02-15 NOTE — Telephone Encounter (Signed)
New message        Pt went to the emergency room Monday and was diagnosed with community pneumonia   does pt need to r/s procedure for 12/28   please give pt daughter a call

## 2014-02-15 NOTE — ED Provider Notes (Signed)
CSN: [id number]     Arrival date & time 02/15/14  1939 History   First MD Initiated Contact with Patient 02/15/14 2107     Chief Complaint  Patient presents with  . Cough     (Consider location/radiation/quality/duration/timing/severity/associated sxs/prior Treatment) HPI [name] is a 68 year old female with past medical history of MI, CAD, CABG 4 and 10/2002, ischemic cardiomyopathy with last EF 25% 11/15, hypertension, hyperlipidemia, diabetes who presents the ER complaining of shortness of breath and cough. Patient was seen and evaluated in the ER last night, diagnosed with community-acquired pneumonia, currently taking azithromycin. Patient reports no improvement of her symptoms since yesterday, is still complaining of a dry cough and dyspnea. Patient reports orthopnea and dyspnea on exertion. Patient denies associated fever, chest pain, dizziness, weakness, abdominal pain, nausea, vomiting, diarrhea, dysuria.   Past Medical History  Diagnosis Date  . Myocardial infarction 10/2002    - MV CAD --> Referred for CABG (no follow-up post CABG)  . 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 Lucianne Lei Trigt: LIMA-LAD, SVG-RPDA, SVG-OM1-OM2  . ST elevation myocardial infarction (STEMI) of inferolateral wall 09/09/2013    SVG-Om1-2 occluded - PCI in 2 locations; Occluded SVG-RCA & native RCA, LAD & native Cx occcluded.  EF ~20%  . Cardiomyopathy, ischemic 09/09/2013    EF ~20-25%; Large MI, existing occluded RCA & SVG-RCA; h/o CABG  . 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 CAD S/P percutaneous coronary angioplasty 09/09/2013    mid  SVG-OM1-OM2 (prox LIMB) Rebel BMS 4.0 mm x 16 mm; sequential limb OM1-OM2: Rebel BMS 4.0 mm x 12 mm  . Combined systolic and diastolic congestive heart failure, NYHA class 3 09/09/2013    Echo post Inferolateral STEMI: Severely reduced LVEF ~20-35%; Akinesis of Inferolateral, Inferior & Inferoseptal walls; Grade 1 DDysfxn (initial LVEDP ~26 mmHg with SBP of 99 mHg)  . Essential hypertension 08/17/2012  . Hyperlipidemia with target LDL less than 70 08/17/2012  . Diabetes mellitus type 2 with complications 3/81/0175   Past Surgical History  Procedure Laterality Date  . Coronary artery bypass graft    . Left heart catheterization with coronary angiogram N/A 09/09/2013    Procedure: LEFT HEART CATHETERIZATION WITH CORONARY ANGIOGRAM;  Surgeon: Leonie Man, MD;  Location: [facility] CATH LAB;  Service: Cardiovascular;  Laterality: N/A;   Family History  Problem Relation Age of Onset  . Diabetes Mother   . Diabetes Father    History  Substance Use Topics  . Smoking status: Former Research scientist (life sciences)  . Smokeless tobacco: Not on file  . Alcohol Use: No   OB History    No data available     Review of Systems  Constitutional: Negative for fever.  HENT: Negative for trouble swallowing.   Eyes: Negative for visual disturbance.  Respiratory: Positive for cough and shortness of breath.   Cardiovascular: Negative for chest pain.  Gastrointestinal: Negative for nausea, vomiting and abdominal pain.  Genitourinary: Negative for dysuria.  Musculoskeletal: Negative for neck pain.  Skin: Negative for rash.  Neurological: Negative for dizziness, weakness and numbness.  Psychiatric/Behavioral:  Negative.       Allergies  Review of patient's allergies indicates no known allergies.  Home Medications   Prior to Admission medications   Medication Sig Start Date End Date Taking? Authorizing Provider  aspirin EC 81 MG EC tablet Take 1 tablet (81 mg total) by mouth daily. 09/14/13  Yes Almyra Deforest, PA  atorvastatin  (LIPITOR) 40 MG tablet TAKE 1 TABLET DAILY 01/19/14  Yes Mary-Margaret Hassell Done, FNP  azithromycin (ZITHROMAX) 250 MG tablet Take 1 tablet (250 mg total) by mouth daily. 02/14/14  Yes Everlene Balls, MD  benzonatate (TESSALON) 100 MG capsule Take 1 capsule (100 mg total) by mouth 2 (two) times daily as needed for cough. 02/12/14  Yes Mary-Margaret Hassell Done, FNP  clopidogrel (PLAVIX) 75 MG tablet Take 1 tablet (75 mg total) by mouth daily. 01/10/14  Yes Mary-Margaret Hassell Done, FNP  diclofenac sodium (VOLTAREN) 1 % GEL Apply 2 g topically 4 (four) times daily. 11/14/13  Yes Mary-Margaret Hassell Done, FNP  furosemide (LASIX) 40 MG tablet Take 20 mg by mouth daily.  09/14/13  Yes Almyra Deforest, PA  glyBURIDE (DIABETA) 5 MG tablet TAKE 1 TABLET TWICE A DAY Patient taking differently: Take 5 mg by mouth 2 (two) times daily with a meal.  01/05/14  Yes Mary-Margaret Hassell Done, FNP  losartan (COZAAR) 50 MG tablet Take 1 tablet (50 mg total) by mouth daily. 12/31/13  Yes Minus Breeding, MD  metoprolol (LOPRESSOR) 50 MG tablet Take 50 mg by mouth 3 (three) times daily.   Yes Historical Provider, MD  sitaGLIPtin (JANUVIA) 100 MG tablet Take 1 tablet (100 mg total) by mouth daily. 01/10/14  Yes Mary-Margaret Hassell Done, FNP  metoprolol succinate (TOPROL-XL) 100 MG 24 hr tablet Take 2 tablets (200 mg total) by mouth daily. Take with or immediately following a meal. Patient not taking: Reported on 02/14/2014 02/08/14   Minus Breeding, MD  nitroGLYCERIN (NITROSTAT) 0.4 MG SL tablet Place 1 tablet (0.4 mg total) under the tongue every 5 (five) minutes as needed for chest pain. 12/01/13   Minus Breeding, MD   BP 116/73 mmHg  Pulse 88  Temp(Src) 97.8 F (36.6 C) (Oral)  Resp 25  SpO2 96% Physical Exam  Constitutional: She is oriented to person, place, and time. She appears well-developed and well-nourished. No distress.  Elderly female in mild distress from discomfort.  HENT:  Head: Normocephalic and atraumatic.  Mouth/Throat: Oropharynx  is clear and moist. No oropharyngeal exudate.  Eyes: Right eye exhibits no discharge. Left eye exhibits no discharge. No scleral icterus.  Neck: Normal range of motion.  Cardiovascular: Normal rate, regular rhythm, S1 normal, S2 normal and normal heart sounds.   No murmur heard. Pulmonary/Chest: Effort normal and breath sounds normal. No accessory muscle usage. No tachypnea. No respiratory distress.  Patient becoming very anxious and tachypneic during exam, then is able to calm herself and have a normal respiratory rate. No muscle retractions, accessory muscle usage. Patient able to speak in full, clear sentences.  Abdominal: Soft. Normal appearance and bowel sounds are normal. There is no tenderness.  Musculoskeletal: Normal range of motion. She exhibits no edema or tenderness.  Neurological: She is alert and oriented to person, place, and time. No cranial nerve deficit. Coordination normal.  Skin: Skin is warm and dry. No rash noted. She is not diaphoretic.  Psychiatric: She has a normal mood and affect.  Nursing note and vitals reviewed.   ED Course  Procedures (including critical care time) Labs Review Labs Reviewed  CBC WITH DIFFERENTIAL -  Abnormal; Notable for the following:    Hemoglobin 11.3 (*)    HCT 34.9 (*)    RDW 15.8 (*)    Neutrophils Relative % 80 (*)    Neutro Abs 8.1 (*)    All other components within normal limits  COMPREHENSIVE METABOLIC PANEL - Abnormal; Notable for the following:    CO2 18 (*)    Glucose, Bld 260 (*)    BUN 44 (*)    Creatinine, Ser 2.42 (*)    AST 428 (*)    ALT 800 (*)    Alkaline Phosphatase 212 (*)    Total Bilirubin 1.4 (*)    GFR calc non Af Amer 19 (*)    GFR calc Af Amer 22 (*)    All other components within normal limits  BRAIN NATRIURETIC PEPTIDE - Abnormal; Notable for the following:    B Natriuretic Peptide 4826.4 (*)    All other components within normal limits  TROPONIN I    Imaging Review Dg Chest 2 View  02/15/2014    CLINICAL DATA:  Cough and shortness of breath 2 weeks  EXAM: CHEST  2 VIEW  COMPARISON:  02/14/2014  FINDINGS: Evidence of CABG reidentified with moderate cardiomegaly. Increased superior segment right lower lobe consolidation is identified. No pleural effusion. No acute osseous finding.  IMPRESSION: Increased superior segment right lower lobe consolidation most compatible with pneumonia given the clinical presentation. Followup to radiographic resolution is recommended to exclude underlying malignancy, which may take 4-6 weeks.   Electronically Signed   By: Conchita Paris M.D.   On: 02/15/2014 22:36   Dg Chest 2 View  02/14/2014   CLINICAL DATA:  Severe dry nonproductive cough for 2 weeks, altered mental status for 2 days.  EXAM: CHEST  2 VIEW  COMPARISON:  Chest radiograph September 10, 2013  FINDINGS: Cardiac silhouette appears moderately enlarged, mediastinal silhouette is nonsuspicious. Patchy RIGHT lower lobe airspace opacity with small RIGHT pleural effusion. RIGHT midlung zone focal airspace opacities. Mild pulmonary vascular congestion. Rounded density in RIGHT hilum suggests vascular shadows. Status post median sternotomy for CABG. No pneumothorax. Soft tissue planes and included osseous structures are nonsuspicious, moderate to severe mid thoracic degenerative discs.  IMPRESSION: RIGHT midlung zone and RIGHT lower lobe patchy airspace opacities may reflect pneumonia with small RIGHT pleural effusion. Recommend followup chest radiograph after treatment to verify improvement.  Cardiomegaly, mild pulmonary vascular congestion.   Electronically Signed   By: Elon Alas   On: 02/14/2014 03:43     EKG Interpretation None      MDM   Final diagnoses:  SOB (shortness of breath)  Elevated liver enzymes    Patient here with second visit for shortness of breath and cough. Patient diagnosed recently last night with committee acquired pneumonia, placed on outpatient azithromycin treatment. Patient  back tonight with identical symptoms, stating no improvement. Based on patient's history, we'll workup for rule out of worsening of CHF. On exam patient is nontoxic tachycardic, nontachypneic, non-hypoxic. She is uncomfortable appearing, however in no acute distress.  Chest radiographs remarkable for impression: Increased superior segment right lower lobe consolidation most compatible with pneumonia given the clinical presentation. Followup to radiographic resolution is recommended to exclude underlying malignancy, which may take 4-6 weeks.  Patient given 1 g of Rocephin IM.  Patient's lab work unremarkable for acute kidney injury compared to last month, elevated liver enzymes compared to last month, and BNP of 4826.4. With elevated liver enzymes and benign abdominal exam we will  further workup with US abdomen complete to help determine cause of patient's elevated liver enzymes and acute kidney injury. EKG unremarkable for acute pathology. Troponin negative tonight. Patient signed out to Quincy Carnes, PA-C with plan to follow-up on US abdomen complete and admit patient for acute kidney injury.  Signed,  Dahlia Bailiff, PA-C 1:13 AM  Patient seen and discussed with Dr. Joseph Berkshire, MD.   Carrie Mew, PA-C 02/16/14 0113  Orpah Greek, MD 02/16/14 [phone]

## 2014-02-16 ENCOUNTER — Inpatient Hospital Stay (HOSPITAL_COMMUNITY): Payer: BC Managed Care – PPO

## 2014-02-16 ENCOUNTER — Encounter (HOSPITAL_COMMUNITY): Payer: Self-pay | Admitting: General Practice

## 2014-02-16 ENCOUNTER — Emergency Department (HOSPITAL_COMMUNITY): Payer: BC Managed Care – PPO

## 2014-02-16 ENCOUNTER — Other Ambulatory Visit: Payer: BC Managed Care – PPO

## 2014-02-16 DIAGNOSIS — R740 Nonspecific elevation of levels of transaminase and lactic acid dehydrogenase [LDH]: Secondary | ICD-10-CM | POA: Diagnosis present

## 2014-02-16 DIAGNOSIS — I200 Unstable angina: Secondary | ICD-10-CM | POA: Diagnosis present

## 2014-02-16 DIAGNOSIS — I2510 Atherosclerotic heart disease of native coronary artery without angina pectoris: Secondary | ICD-10-CM | POA: Diagnosis present

## 2014-02-16 DIAGNOSIS — J189 Pneumonia, unspecified organism: Secondary | ICD-10-CM | POA: Diagnosis present

## 2014-02-16 DIAGNOSIS — N179 Acute kidney failure, unspecified: Secondary | ICD-10-CM | POA: Diagnosis present

## 2014-02-16 DIAGNOSIS — R0602 Shortness of breath: Secondary | ICD-10-CM | POA: Diagnosis present

## 2014-02-16 DIAGNOSIS — K8020 Calculus of gallbladder without cholecystitis without obstruction: Secondary | ICD-10-CM | POA: Diagnosis present

## 2014-02-16 DIAGNOSIS — I129 Hypertensive chronic kidney disease with stage 1 through stage 4 chronic kidney disease, or unspecified chronic kidney disease: Secondary | ICD-10-CM | POA: Diagnosis present

## 2014-02-16 DIAGNOSIS — I5042 Chronic combined systolic (congestive) and diastolic (congestive) heart failure: Secondary | ICD-10-CM | POA: Diagnosis present

## 2014-02-16 DIAGNOSIS — I255 Ischemic cardiomyopathy: Secondary | ICD-10-CM

## 2014-02-16 DIAGNOSIS — I252 Old myocardial infarction: Secondary | ICD-10-CM | POA: Diagnosis not present

## 2014-02-16 DIAGNOSIS — I10 Essential (primary) hypertension: Secondary | ICD-10-CM

## 2014-02-16 DIAGNOSIS — Z951 Presence of aortocoronary bypass graft: Secondary | ICD-10-CM | POA: Diagnosis not present

## 2014-02-16 DIAGNOSIS — E118 Type 2 diabetes mellitus with unspecified complications: Secondary | ICD-10-CM

## 2014-02-16 DIAGNOSIS — E785 Hyperlipidemia, unspecified: Secondary | ICD-10-CM | POA: Diagnosis present

## 2014-02-16 DIAGNOSIS — Z9861 Coronary angioplasty status: Secondary | ICD-10-CM | POA: Diagnosis not present

## 2014-02-16 DIAGNOSIS — E11649 Type 2 diabetes mellitus with hypoglycemia without coma: Secondary | ICD-10-CM | POA: Diagnosis present

## 2014-02-16 DIAGNOSIS — E860 Dehydration: Secondary | ICD-10-CM | POA: Diagnosis present

## 2014-02-16 DIAGNOSIS — Z87891 Personal history of nicotine dependence: Secondary | ICD-10-CM | POA: Diagnosis not present

## 2014-02-16 DIAGNOSIS — R7401 Elevation of levels of liver transaminase levels: Secondary | ICD-10-CM | POA: Diagnosis present

## 2014-02-16 DIAGNOSIS — Z7982 Long term (current) use of aspirin: Secondary | ICD-10-CM | POA: Diagnosis not present

## 2014-02-16 DIAGNOSIS — N183 Chronic kidney disease, stage 3 (moderate): Secondary | ICD-10-CM | POA: Diagnosis present

## 2014-02-16 LAB — INFLUENZA PANEL BY PCR (TYPE A & B)
H1N1FLUPCR: NOT DETECTED
Influenza A By PCR: NEGATIVE
Influenza B By PCR: NEGATIVE

## 2014-02-16 LAB — CBC
HEMATOCRIT: 33.5 % — AB (ref 36.0–46.0)
Hemoglobin: 11 g/dL — ABNORMAL LOW (ref 12.0–15.0)
MCH: 29.5 pg (ref 26.0–34.0)
MCHC: 32.8 g/dL (ref 30.0–36.0)
MCV: 89.8 fL (ref 78.0–100.0)
Platelets: 145 10*3/uL — ABNORMAL LOW (ref 150–400)
RBC: 3.73 MIL/uL — ABNORMAL LOW (ref 3.87–5.11)
RDW: 16.1 % — AB (ref 11.5–15.5)
WBC: 9.2 10*3/uL (ref 4.0–10.5)

## 2014-02-16 LAB — HEPATITIS PANEL, ACUTE
HCV AB: NEGATIVE
HEP B S AG: NEGATIVE
Hep A IgM: NONREACTIVE
Hep B C IgM: NONREACTIVE

## 2014-02-16 LAB — GLUCOSE, CAPILLARY
Glucose-Capillary: 308 mg/dL — ABNORMAL HIGH (ref 70–99)
Glucose-Capillary: 56 mg/dL — ABNORMAL LOW (ref 70–99)
Glucose-Capillary: 78 mg/dL (ref 70–99)

## 2014-02-16 LAB — HEMOGLOBIN A1C
Hgb A1c MFr Bld: 8.5 % — ABNORMAL HIGH (ref <5.7)
Mean Plasma Glucose: 197 mg/dL — ABNORMAL HIGH (ref <117)

## 2014-02-16 LAB — ACETAMINOPHEN LEVEL

## 2014-02-16 LAB — CREATININE, SERUM
Creatinine, Ser: 2.13 mg/dL — ABNORMAL HIGH (ref 0.50–1.10)
GFR, EST AFRICAN AMERICAN: 26 mL/min — AB (ref >90)
GFR, EST NON AFRICAN AMERICAN: 23 mL/min — AB (ref >90)

## 2014-02-16 LAB — HIV ANTIBODY (ROUTINE TESTING W REFLEX): HIV: NONREACTIVE

## 2014-02-16 MED ORDER — IPRATROPIUM-ALBUTEROL 0.5-2.5 (3) MG/3ML IN SOLN: 3.0000 mL | Freq: Four times a day (QID) | RESPIRATORY_TRACT | Status: DC | PRN

## 2014-02-16 MED ORDER — INSULIN ASPART 100 UNIT/ML ~~LOC~~ SOLN
0.0000 [IU] | Freq: Three times a day (TID) | SUBCUTANEOUS | Status: DC
  Administered 2014-02-16: 7 [IU] via SUBCUTANEOUS

## 2014-02-16 MED ORDER — ASPIRIN EC 81 MG PO TBEC
81.0000 mg | DELAYED_RELEASE_TABLET | Freq: Every day | ORAL | Status: DC
  Administered 2014-02-16 – 2014-02-17 (×2): 81 mg via ORAL
  Filled 2014-02-16 (×2): qty 1

## 2014-02-16 MED ORDER — TECHNETIUM TC 99M MEBROFENIN IV KIT
5.0000 | PACK | Freq: Once | INTRAVENOUS | Status: AC | PRN
  Administered 2014-02-16: 5 via INTRAVENOUS

## 2014-02-16 MED ORDER — INSULIN ASPART 100 UNIT/ML ~~LOC~~ SOLN: 0.0000 [IU] | Freq: Every day | SUBCUTANEOUS | Status: DC

## 2014-02-16 MED ORDER — NITROGLYCERIN 0.4 MG SL SUBL: 0.4000 mg | SUBLINGUAL_TABLET | SUBLINGUAL | Status: DC | PRN

## 2014-02-16 MED ORDER — CEFTRIAXONE SODIUM IN DEXTROSE 20 MG/ML IV SOLN
1.0000 g | INTRAVENOUS | Status: DC
  Administered 2014-02-16 – 2014-02-17 (×2): 1 g via INTRAVENOUS
  Filled 2014-02-16 (×2): qty 50

## 2014-02-16 MED ORDER — LINAGLIPTIN 5 MG PO TABS
5.0000 mg | ORAL_TABLET | Freq: Every day | ORAL | Status: DC
  Administered 2014-02-16 – 2014-02-17 (×2): 5 mg via ORAL
  Filled 2014-02-16 (×2): qty 1

## 2014-02-16 MED ORDER — GLYBURIDE 5 MG PO TABS
5.0000 mg | ORAL_TABLET | Freq: Two times a day (BID) | ORAL | Status: DC
  Filled 2014-02-16 (×2): qty 1

## 2014-02-16 MED ORDER — BENZONATATE 100 MG PO CAPS
100.0000 mg | ORAL_CAPSULE | Freq: Three times a day (TID) | ORAL | Status: DC
  Filled 2014-02-16 (×3): qty 1

## 2014-02-16 MED ORDER — STERILE WATER FOR INJECTION IJ SOLN
INTRAMUSCULAR | Status: AC
  Administered 2014-02-16: 10 mL
  Filled 2014-02-16: qty 10

## 2014-02-16 MED ORDER — SINCALIDE 5 MCG IJ SOLR
INTRAMUSCULAR | Status: AC
  Filled 2014-02-16: qty 5

## 2014-02-16 MED ORDER — CLOPIDOGREL BISULFATE 75 MG PO TABS
75.0000 mg | ORAL_TABLET | Freq: Every day | ORAL | Status: DC
  Administered 2014-02-16 – 2014-02-17 (×2): 75 mg via ORAL
  Filled 2014-02-16 (×2): qty 1

## 2014-02-16 MED ORDER — SODIUM CHLORIDE 0.9 % IV SOLN: INTRAVENOUS | Status: DC

## 2014-02-16 MED ORDER — ENOXAPARIN SODIUM 30 MG/0.3ML ~~LOC~~ SOLN
30.0000 mg | SUBCUTANEOUS | Status: DC
  Filled 2014-02-16 (×2): qty 0.3

## 2014-02-16 MED ORDER — METOPROLOL TARTRATE 50 MG PO TABS
50.0000 mg | ORAL_TABLET | Freq: Two times a day (BID) | ORAL | Status: DC
  Filled 2014-02-16 (×2): qty 1

## 2014-02-16 MED ORDER — DEXTROSE 5 % IV SOLN
500.0000 mg | INTRAVENOUS | Status: DC
  Administered 2014-02-16 – 2014-02-17 (×2): 500 mg via INTRAVENOUS
  Filled 2014-02-16 (×2): qty 500

## 2014-02-16 MED ORDER — SODIUM CHLORIDE 0.9 % IV SOLN
INTRAVENOUS | Status: DC
  Administered 2014-02-16: 04:00:00 via INTRAVENOUS

## 2014-02-16 MED ORDER — INSULIN ASPART 100 UNIT/ML ~~LOC~~ SOLN: 0.0000 [IU] | SUBCUTANEOUS | Status: DC

## 2014-02-16 MED ORDER — GUAIFENESIN-CODEINE 100-10 MG/5ML PO SOLN
5.0000 mL | Freq: Four times a day (QID) | ORAL | Status: DC | PRN
  Administered 2014-02-16: 5 mL via ORAL
  Filled 2014-02-16: qty 5

## 2014-02-16 MED ORDER — SINCALIDE 5 MCG IJ SOLR
0.0200 ug/kg | Freq: Once | INTRAMUSCULAR | Status: DC
  Filled 2014-02-16: qty 5

## 2014-02-16 NOTE — ED Notes (Signed)
PA at BS.  

## 2014-02-16 NOTE — Evaluation (Signed)
Patient with worsening shortness of breath in the setting of community-acquired pneumonia. Was seen yesterday and started on azithromycin for community-acquired pneumonia. Worsening symptoms over the past 24 hours. 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 Prior history of coronary artery disease status post MI. No active chest pain per report. Troponin negative 1. ProBNP 4800. Baseline around 4800. Chest x-ray shows worsening right lower lobe pneumonia. Satting well on room air. Hemodynamically stable. Positive metabolic acidosis. Noted marked transaminitis. AST 428, ALT 800, alkaline phosphatase 212, T bili 1.4. Abdominal ultrasound the ER shows gallstones with out any sonographic sign of cholecystitis. Started on Rocephin and azithromycin for community acquired pneumonia coverage. Had on acute hepatitis panel and Tylenol level pending formal further evaluation. Accepted to telemetry bed.

## 2014-02-16 NOTE — Progress Notes (Signed)
  Pt admitted to the unit. Pt is stable, alert and oriented per baseline. Oriented to room, staff, and call bell. Educated to call for any assistance. Bed in lowest position, call bell within reach- will continue to monitor. 

## 2014-02-16 NOTE — Progress Notes (Signed)
Utilization review completed. Brenan [place], RN, BSN. 

## 2014-02-16 NOTE — Evaluation (Signed)
Physical Therapy Evaluation and discharge. Patient Details Name: Jo Lynn MRN: [id number] DOB: October 17, 1945 Today's Date: 02/16/2014   History of Present Illness  Patient is a 68 year old female with CAD, CABG 4, MI in 7/15, ischemic cardiomyopathy, hypertension, diabetes, hyperlipidemia presented to ER with cough and weakness for last 2 weeks and worsened in the last 2 days. Chest x-ray had shown right mid lung pneumonia, from previous ER visit 12/21. Patient returned back to the ER last night 12/22 with assistance symptoms otherwise denied fevers or chills.  Clinical Impression  Pt functioning at baseline. Pt with no coughing during PT session. Pt with no episodes of LOB. Pt with no further skilled PT needs, PT signing off. Please re-consult if needed in future.    Follow Up Recommendations No PT follow up    Equipment Recommendations  None recommended by PT    Recommendations for Other Services       Precautions / Restrictions Precautions: DROPLET Precautions: Fall Restrictions Weight Bearing Restrictions: No      Mobility  Bed Mobility Overal bed mobility: Independent                Transfers Overall transfer level: Independent               General transfer comment: no signs of instability  Ambulation/Gait Ambulation/Gait assistance: Modified independent (Device/Increase time) Ambulation Distance (Feet): 200 Feet Assistive device: None Gait Pattern/deviations: Step-through pattern Gait velocity: decreased   General Gait Details: pt with decreased endurance due to poor EF of 20-25%. This is pt's baseline. Pt took 1 sitting rest break and 1 standing rest break.  Stairs Stairs: Yes Stairs assistance: Modified independent (Device/Increase time) Stair Management: One rail Right;Alternating pattern Number of Stairs: 5 General stair comments: no episodes of instability  Wheelchair Mobility    Modified Rankin (Stroke Patients Only)        Balance Overall balance assessment: No apparent balance deficits (not formally assessed)                                           Pertinent Vitals/Pain Pain Assessment: No/denies pain    Home Living Family/patient expects to be discharged to:: Private residence Living Arrangements: Children Available Help at Discharge: Family;Available PRN/intermittently (dtr works) Type of Home: House Home Access: Stairs to enter Entrance Stairs-Rails: Horticulturist, commercial of Steps: 5 Home Layout: One level Home Equipment: None      Prior Function Level of Independence: Independent         Comments: works full time, 12hr shifts     Hand Dominance   Dominant Hand: Right    Extremity/Trunk Assessment   Upper Extremity Assessment: Overall WFL for tasks assessed           Lower Extremity Assessment: Overall WFL for tasks assessed      Cervical / Trunk Assessment: Normal  Communication   Communication: No difficulties  Cognition Arousal/Alertness: Awake/alert Behavior During Therapy: WFL for tasks assessed/performed Overall Cognitive Status: Within Functional Limits for tasks assessed                      General Comments      Exercises        Assessment/Plan    PT Assessment Patent does not need any further PT services  PT Diagnosis Generalized weakness   PT Problem List    PT  Treatment Interventions     PT Goals (Current goals can be found in the Care Plan section) Acute Rehab PT Goals Patient Stated Goal: home  PT Goal Formulation: All assessment and education complete, DC therapy    Frequency     Barriers to discharge        Co-evaluation               End of Session   Activity Tolerance: Patient tolerated treatment well Patient left: in chair;with call bell/phone within reach (SLP present) Nurse Communication: Mobility status         Time: 1941-7408 PT Time Calculation (min) (ACUTE ONLY): 17  min   Charges:   PT Evaluation $Initial PT Evaluation Tier I: 1 Procedure PT Treatments $Gait Training: 8-22 mins   PT G CodesKingsley Callander 02/16/2014, 1:57 PM   Kittie Plater, PT, DPT Pager #: [phone] Office #: [phone]

## 2014-02-16 NOTE — ED Notes (Signed)
Family upset, they have not seen a provider. This RN trying to figure out which provider is caring for pt at this time.

## 2014-02-16 NOTE — ED Provider Notes (Signed)
Patient received in sign out from PA [place] at shift change.  Briefly, 68 year old female seen yesterday and diagnosed with pneumonia. She was started on azithromycin but reports worsening symptoms.  Repeat CXR obtained revealing worsening CAP, no pulmonary edema noted.  Lab work without leukocytosis. BNP elevated at 4826-- patient has known CHF with estimated EF of approx 25%.  SrCr also elevated compared with last month, consistent with AKI.  Live enzymes also elevated-- no known hx of same.  No current abdominal pain.  Plan:  Abdominal u/s pending.  Patient will likely need admission.  Results for orders placed or performed during the hospital encounter of 02/15/14  CBC with Differential  Result Value Ref Range   WBC 10.2 4.0 - 10.5 K/uL   RBC 4.00 3.87 - 5.11 MIL/uL   Hemoglobin 11.3 (L) 12.0 - 15.0 g/dL   HCT 34.9 (L) 36.0 - 46.0 %   MCV 87.3 78.0 - 100.0 fL   MCH 28.3 26.0 - 34.0 pg   MCHC 32.4 30.0 - 36.0 g/dL   RDW 15.8 (H) 11.5 - 15.5 %   Platelets 178 150 - 400 K/uL   Neutrophils Relative % 80 (H) 43 - 77 %   Neutro Abs 8.1 (H) 1.7 - 7.7 K/uL   Lymphocytes Relative 13 12 - 46 %   Lymphs Abs 1.3 0.7 - 4.0 K/uL   Monocytes Relative 7 3 - 12 %   Monocytes Absolute 0.7 0.1 - 1.0 K/uL   Eosinophils Relative 0 0 - 5 %   Eosinophils Absolute 0.0 0.0 - 0.7 K/uL   Basophils Relative 0 0 - 1 %   Basophils Absolute 0.0 0.0 - 0.1 K/uL  Comprehensive metabolic panel  Result Value Ref Range   Sodium 141 135 - 145 mmol/L   Potassium 4.4 3.5 - 5.1 mmol/L   Chloride 111 96 - 112 mEq/L   CO2 18 (L) 19 - 32 mmol/L   Glucose, Bld 260 (H) 70 - 99 mg/dL   BUN 44 (H) 6 - 23 mg/dL   Creatinine, Ser 2.42 (H) 0.50 - 1.10 mg/dL   Calcium 9.2 8.4 - 10.5 mg/dL   Total Protein 6.4 6.0 - 8.3 g/dL   Albumin 3.5 3.5 - 5.2 g/dL   AST 428 (H) 0 - 37 U/L   ALT 800 (H) 0 - 35 U/L   Alkaline Phosphatase 212 (H) 39 - 117 U/L   Total Bilirubin 1.4 (H) 0.3 - 1.2 mg/dL   GFR calc non Af Amer 19 (L) >90  mL/min   GFR calc Af Amer 22 (L) >90 mL/min   Anion gap 12 5 - 15  Brain natriuretic peptide  Result Value Ref Range   B Natriuretic Peptide 4826.4 (H) 0.0 - 100.0 pg/mL  Troponin I  Result Value Ref Range   Troponin I <0.03 <0.031 ng/mL   Dg Chest 2 View  02/15/2014   CLINICAL DATA:  Cough and shortness of breath 2 weeks  EXAM: CHEST  2 VIEW  COMPARISON:  02/14/2014  FINDINGS: Evidence of CABG reidentified with moderate cardiomegaly. Increased superior segment right lower lobe consolidation is identified. No pleural effusion. No acute osseous finding.  IMPRESSION: Increased superior segment right lower lobe consolidation most compatible with pneumonia given the clinical presentation. Followup to radiographic resolution is recommended to exclude underlying malignancy, which may take 4-6 weeks.   Electronically Signed   By: Conchita Paris M.D.   On: 02/15/2014 22:36   Dg Chest 2 View  02/14/2014  CLINICAL DATA:  Severe dry nonproductive cough for 2 weeks, altered mental status for 2 days.  EXAM: CHEST  2 VIEW  COMPARISON:  Chest radiograph September 10, 2013  FINDINGS: Cardiac silhouette appears moderately enlarged, mediastinal silhouette is nonsuspicious. Patchy RIGHT lower lobe airspace opacity with small RIGHT pleural effusion. RIGHT midlung zone focal airspace opacities. Mild pulmonary vascular congestion. Rounded density in RIGHT hilum suggests vascular shadows. Status post median sternotomy for CABG. No pneumothorax. Soft tissue planes and included osseous structures are nonsuspicious, moderate to severe mid thoracic degenerative discs.  IMPRESSION: RIGHT midlung zone and RIGHT lower lobe patchy airspace opacities may reflect pneumonia with small RIGHT pleural effusion. Recommend followup chest radiograph after treatment to verify improvement.  Cardiomegaly, mild pulmonary vascular congestion.   Electronically Signed   By: Elon Alas   On: 02/14/2014 03:43   US Abdomen  Complete  02/16/2014   CLINICAL DATA:  Elevated liver function tests  EXAM: ULTRASOUND ABDOMEN COMPLETE  COMPARISON:  None.  FINDINGS: Gallbladder: Gallstones are identified, largest 1.8 cm. Gallbladder wall thickness at upper limits of normal but likely in part due to under distention. No sonographic Murphy sign. No pericholecystic fluid.  Common bile duct: Diameter: 4 mm  Liver: No focal lesion identified. Within normal limits in parenchymal echogenicity.  IVC: No abnormality visualized.  Pancreas: Obscured by bowel gas  Spleen: Size and appearance within normal limits.  Right Kidney: Length: 11.4 cm. Increased renal cortical echogenicity with mild renal cortical thinning. No hydronephrosis.  Left Kidney: Length: 10.7 cm. No hydronephrosis. Simple appearing mid renal cortical cyst 6.4 x 5.9 x 5.6 cm. Increased renal cortical echogenicity without focal mass otherwise.  Abdominal aorta: No aneurysm visualized.  Other findings: Moderate right pleural effusion partly visualized.  IMPRESSION: Gallstones without other sonographic evidence for acute cholecystitis.  No acute intra-abdominal pathology.   Electronically Signed   By: Conchita Paris M.D.   On: 02/16/2014 01:45    Abdominal ultrasound revealing gallstones without findings of acute cholecystitis.  Patient given judicious fluids given her CHF.  Added IV rocephin to her PO azithromycin. Case discussed with hospitalist, Dr. Ernestina Patches, who will admit to telemetry floor.  Temp admission orders placed, VS remain stable.  Larene Pickett, PA-C 02/16/14 [id number]  April K Palumbo-Rasch, MD 02/16/14 [phone]

## 2014-02-16 NOTE — H&P (Signed)
History and Physical       Hospital Admission Note Date: 02/16/2014  Patient name: Jo Lynn Medical record number: [id number] Date of birth: 02/04/1946 Age: 68 y.o. Gender: female  PCP: Chevis Pretty, FNP    Chief Complaint:  Shortness of breath with coughing, for last 2 weeks, worsened for last 2 days  HPI: Patient is a 68 year old female with CAD, CABG 4, MI in 7/15, ischemic cardiomyopathy, hypertension, diabetes, hyperlipidemia presented to ER with cough and weakness for last 2 weeks and worsened in the last 2 days. Patient was seen in the ER on 12/21 for the same symptoms. She also reported associated chest congestion, rhinorrhea, difficulty breathing and orthopnea in the posterior days. She reported diarrhea 2 days before and vomiting due to coughing. Chest x-ray had shown right mid lung pneumonia, patient was given a Zithromax however she has only taken 2 tablets of Zithromax so far. Patient returned back to the ER last night with assistance symptoms otherwise denied fevers or chills. Chest x-ray showed increased his superior segment right lower lobe consolidation, lab work also showed elevated liver enzymes BNP of 4826. Troponins negative. Patient was admitted for further workup.  Review of Systems:  Constitutional: Denies fever, chills, diaphoresis,+ poor appetite and fatigue.  HEENT: Denies photophobia, eye pain, redness, hearing loss, ear pain, sneezing, mouth sores, trouble swallowing, neck pain, neck stiffness and tinnitus.  + congestion with rhinorrhea Respiratory: Please see history of present illness Cardiovascular: Denies chest pain, palpitations and leg swelling.  Gastrointestinal: Denies abdominal pain, diarrhea, constipation, blood in stool and abdominal distention. + nausea with vomiting Genitourinary: Denies dysuria, urgency, frequency, hematuria, flank pain and difficulty urinating.  Musculoskeletal:  Denies myalgias, back pain, joint swelling, arthralgias and gait problem.  Skin: Denies pallor, rash and wound.  Neurological: Denies dizziness, seizures, syncope, light-headedness, numbness and headaches. + generalized weakness Hematological: Denies adenopathy. Easy bruising, personal or family bleeding history  Psychiatric/Behavioral: Denies suicidal ideation, mood changes, confusion, nervousness, sleep disturbance and agitation  Past Medical History: Past Medical History  Diagnosis Date  . Myocardial infarction 10/2002    - MV CAD --> Referred for CABG (no follow-up post CABG)  . 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 Lucianne Lei Trigt: LIMA-LAD, SVG-RPDA, SVG-OM1-OM2  . ST elevation myocardial infarction (STEMI) of inferolateral wall 09/09/2013    SVG-Om1-2 occluded - PCI in 2 locations; Occluded SVG-RCA & native RCA, LAD & native Cx occcluded.  EF ~20%  . Cardiomyopathy, ischemic 09/09/2013    EF ~20-25%; Large MI, existing occluded RCA & SVG-RCA; h/o CABG  . 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 CAD S/P percutaneous coronary angioplasty 09/09/2013    mid SVG-OM1-OM2 (prox LIMB) Rebel BMS 4.0 mm x 16 mm; sequential limb OM1-OM2: Rebel BMS 4.0 mm x 12 mm  . Combined systolic and diastolic congestive heart failure, NYHA class 3 09/09/2013    Echo post Inferolateral STEMI: Severely reduced LVEF ~20-35%; Akinesis of Inferolateral, Inferior & Inferoseptal walls; Grade 1 DDysfxn (initial LVEDP ~26 mmHg with SBP of 99 mHg)  . Essential hypertension 08/17/2012  . Hyperlipidemia  with target LDL less than 70 08/17/2012  . Diabetes mellitus type 2  with complications 4/53/6468   Past Surgical History  Procedure Laterality Date  . Coronary artery bypass graft    . Left heart catheterization with coronary angiogram N/A 09/09/2013    Procedure: LEFT HEART CATHETERIZATION WITH CORONARY ANGIOGRAM;  Surgeon: Leonie Man, MD;  Location: [facility] C[facility] LAB;  Service: Cardiovascular;  Laterality: N/A;    Medications: Prior to Admission medications   Medication Sig Start Date End Date Taking? Authorizing Provider  aspirin EC 81 MG EC tablet Take 1 tablet (81 mg total) by mouth daily. 09/14/13  Yes Almyra Deforest, PA  atorvastatin (LIPITOR) 40 MG tablet TAKE 1 TABLET DAILY 01/19/14  Yes Mary-Margaret Hassell Done, FNP  azithromycin (ZITHROMAX) 250 MG tablet Take 1 tablet (250 mg total) by mouth daily. 02/14/14  Yes Everlene Balls, MD  benzonatate (TESSALON) 100 MG capsule Take 1 capsule (100 mg total) by mouth 2 (two) times daily as needed for cough. 02/12/14  Yes Mary-Margaret Hassell Done, FNP  clopidogrel (PLAVIX) 75 MG tablet Take 1 tablet (75 mg total) by mouth daily. 01/10/14  Yes Mary-Margaret Hassell Done, FNP  diclofenac sodium (VOLTAREN) 1 % GEL Apply 2 g topically 4 (four) times daily. 11/14/13  Yes Mary-Margaret Hassell Done, FNP  furosemide (LASIX) 40 MG tablet Take 20 mg by mouth daily.  09/14/13  Yes Almyra Deforest, PA  glyBURIDE (DIABETA) 5 MG tablet TAKE 1 TABLET TWICE A DAY Patient taking differently: Take 5 mg by mouth 2 (two) times daily with a meal.  01/05/14  Yes Mary-Margaret Hassell Done, FNP  losartan (COZAAR) 50 MG tablet Take 1 tablet (50 mg total) by mouth daily. 12/31/13  Yes Minus Breeding, MD  metoprolol (LOPRESSOR) 50 MG tablet Take 50 mg by mouth 3 (three) times daily.   Yes Historical Provider, MD  sitaGLIPtin (JANUVIA) 100 MG tablet Take 1 tablet (100 mg total) by mouth daily. 01/10/14  Yes Mary-Margaret Hassell Done, FNP  metoprolol succinate (TOPROL-XL) 100 MG 24 hr tablet Take 2 tablets (200 mg total) by mouth daily. Take with or immediately following a meal. Patient  not taking: Reported on 02/14/2014 02/08/14   Minus Breeding, MD  nitroGLYCERIN (NITROSTAT) 0.4 MG SL tablet Place 1 tablet (0.4 mg total) under the tongue every 5 (five) minutes as needed for chest pain. 12/01/13   Minus Breeding, MD    Allergies:  No Known Allergies  Social History:  reports that she has quit smoking. She does not have any smokeless tobacco history on file. She reports that she does not drink alcohol or use illicit drugs.  Family History: Family History  Problem Relation Age of Onset  . Diabetes Mother   . Diabetes Father     Physical Exam: Blood pressure 95/69, pulse 82, temperature 98.5 F (36.9 C), temperature source Oral, resp. rate 20, height 5\' 6"  (1.676 m), weight 89.449 kg (197 lb 3.2 oz), SpO2 100 %. General: Alert, awake, oriented x3, in no acute distress. HEENT: normocephalic, atraumatic, anicteric sclera, pink conjunctiva, pupils equal and reactive to light and accomodation, oropharynx clear Neck: supple, no masses or lymphadenopathy, no goiter, no bruits  Heart: Regular rate and rhythm, without murmurs, rubs or gallops. Lungs: Decreased breath sounds at the bases with crackles in the right lung base Abdomen: Soft, nontender, nondistended, positive bowel sounds, no masses. Extremities: No clubbing, cyanosis or edema with positive pedal pulses. Neuro: Grossly intact, no focal neurological deficits, strength 5/5 upper and lower extremities bilaterally Psych: alert and oriented x  3, normal mood and affect Skin: no rashes or lesions, warm and dry   LABS on Admission:  Basic Metabolic Panel:  Recent Labs Lab 02/15/14 2147  NA 141  K 4.4  CL 111  CO2 18*  GLUCOSE 260*  BUN 44*  CREATININE 2.42*  CALCIUM 9.2   Liver Function Tests:  Recent Labs Lab 02/15/14 2147  AST 428*  ALT 800*  ALKPHOS 212*  BILITOT 1.4*  PROT 6.4  ALBUMIN 3.5   No results for input(s): LIPASE, AMYLASE in the last 168 hours. No results for input(s): AMMONIA in  the last 168 hours. CBC:  Recent Labs Lab 02/15/14 2147  WBC 10.2  NEUTROABS 8.1*  HGB 11.3*  HCT 34.9*  MCV 87.3  PLT 178   Cardiac Enzymes:  Recent Labs Lab 02/15/14 2147  TROPONINI <0.03   BNP: Invalid input(s): POCBNP CBG: No results for input(s): GLUCAP in the last 168 hours.   Radiological Exams on Admission: Dg Chest 2 View  02/15/2014   CLINICAL DATA:  Cough and shortness of breath 2 weeks  EXAM: CHEST  2 VIEW  COMPARISON:  02/14/2014  FINDINGS: Evidence of CABG reidentified with moderate cardiomegaly. Increased superior segment right lower lobe consolidation is identified. No pleural effusion. No acute osseous finding.  IMPRESSION: Increased superior segment right lower lobe consolidation most compatible with pneumonia given the clinical presentation. Followup to radiographic resolution is recommended to exclude underlying malignancy, which may take 4-6 weeks.   Electronically Signed   By: Conchita Paris M.D.   On: 02/15/2014 22:36   Dg Chest 2 View  02/14/2014   CLINICAL DATA:  Severe dry nonproductive cough for 2 weeks, altered mental status for 2 days.  EXAM: CHEST  2 VIEW  COMPARISON:  Chest radiograph September 10, 2013  FINDINGS: Cardiac silhouette appears moderately enlarged, mediastinal silhouette is nonsuspicious. Patchy RIGHT lower lobe airspace opacity with small RIGHT pleural effusion. RIGHT midlung zone focal airspace opacities. Mild pulmonary vascular congestion. Rounded density in RIGHT hilum suggests vascular shadows. Status post median sternotomy for CABG. No pneumothorax. Soft tissue planes and included osseous structures are nonsuspicious, moderate to severe mid thoracic degenerative discs.  IMPRESSION: RIGHT midlung zone and RIGHT lower lobe patchy airspace opacities may reflect pneumonia with small RIGHT pleural effusion. Recommend followup chest radiograph after treatment to verify improvement.  Cardiomegaly, mild pulmonary vascular congestion.    Electronically Signed   By: Elon Alas   On: 02/14/2014 03:43   US Abdomen Complete  02/16/2014   CLINICAL DATA:  Elevated liver function tests  EXAM: ULTRASOUND ABDOMEN COMPLETE  COMPARISON:  None.  FINDINGS: Gallbladder: Gallstones are identified, largest 1.8 cm. Gallbladder wall thickness at upper limits of normal but likely in part due to under distention. No sonographic Murphy sign. No pericholecystic fluid.  Common bile duct: Diameter: 4 mm  Liver: No focal lesion identified. Within normal limits in parenchymal echogenicity.  IVC: No abnormality visualized.  Pancreas: Obscured by bowel gas  Spleen: Size and appearance within normal limits.  Right Kidney: Length: 11.4 cm. Increased renal cortical echogenicity with mild renal cortical thinning. No hydronephrosis.  Left Kidney: Length: 10.7 cm. No hydronephrosis. Simple appearing mid renal cortical cyst 6.4 x 5.9 x 5.6 cm. Increased renal cortical echogenicity without focal mass otherwise.  Abdominal aorta: No aneurysm visualized.  Other findings: Moderate right pleural effusion partly visualized.  IMPRESSION: Gallstones without other sonographic evidence for acute cholecystitis.  No acute intra-abdominal pathology.   Electronically Signed   By: Elzie Rings  Green M.D.   On: 02/16/2014 01:45    Assessment/Plan Principal Problem:   CAP (community acquired pneumonia) - Patient was just seen in the ER a day before and started on a Zithromax, will continue treatment per community-acquired pneumonia protocol - Admit to telemetry, obtain influenza panel, urine legionella antigen, urine strep antigen, blood cultures - Placed on IV Rocephin, Zithromax - Continue supportive treatment with antitussives, O2, incentive spirometry, bronchodilators  Active Problems:   Hypotension with history of hypertension - Likely due to dehydration and #1, hold the Lasix, metoprolol, losartan    Hyperlipidemia with target LDL less than 70 - Due to a transaminitis,  hold statins    Diabetes mellitus type 2 with complications - Placed on sliding-scale insulin, continue tradgenta, glyburide    Cardiomyopathy, ischemic - EF ~20-25%; Large MI, existing occluded RCA & SVG-RCA; h/o CABG - Currently no chest pain, troponins negative - Hold Lasix, losartan due to acute kidney injury Likely due to dehydration - Continue aspirin, Plavix, hold beta blockers can due to hypotension    Transaminitis - Hold statins, abdominal ultrasound showed no focal liver lesions, gallstones with largest 1.8 cm, no pericholecystic fluid, obtain HIDA scan  - Patient has no abdominal pain, pending HIDA scan results, will discuss with general surgery   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   Time Spent on Admission: 1 hour   Lamees Gable M.D. [facility] 02/16/2014, 10:17 AM Pager: [phone]  If 7PM-7AM, please contact night-coverage [email or web address] Password [email or web address]

## 2014-02-16 NOTE — Progress Notes (Signed)
PHARMACIST - PHYSICIAN COMMUNICATION  CONCERNING:  Glyburide   RECOMMENDATION: Have discontinued for now  Her HgB A1c is 8 -- she may need a different regimen anyway.  DESCRIPTION: Glyburide is contra-indicated in patients with renal insufficiency (CrCl< 50 ml/ min)  Thank you. Anette Guarneri, PharmD [phone]

## 2014-02-16 NOTE — Progress Notes (Signed)
Inpatient Diabetes Program Recommendations  AACE/ADA: New Consensus Statement on Inpatient Glycemic Control (2013)  Target Ranges:  Prepandial:   less than 140 mg/dL      Peak postprandial:   less than 180 mg/dL (1-2 hours)      Critically ill patients:  140 - 180 mg/dL     Results for Jo Lynn, Jo Lynn (MRN [id number]) as of 02/16/2014 08:40  Ref. Range 02/15/2014 21:47  Glucose Latest Range: 70-99 mg/dL 260 (H)    Admitted with Pneumonia.  History of DM, CAD, CABG, CHF, HTN.   Home DM Meds: Glyburide 5 mg bid       Januvia 100 mg daily   Current Orders: No Orders placed yet   MD- Please consider starting Novolog Moderate SSI (Q4 hours if NPO)     Will follow Wyn Quaker RN, MSN, CDE Diabetes Coordinator Inpatient Diabetes Program Team Pager: [phone] (8a-10p)

## 2014-02-16 NOTE — Evaluation (Signed)
Clinical/Bedside Swallow Evaluation Patient Details  Name: Jo Lynn MRN: [id number] Date of Birth: 1945-03-18  Today's Date: 02/16/2014 Time: 1347-1401 SLP Time Calculation (min) (ACUTE ONLY): 14 min  Past Medical History:  Past Medical History  Diagnosis Date  . Myocardial infarction 10/2002    - MV CAD --> Referred for CABG (no follow-up post CABG)  . 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 Lucianne Lei Trigt: LIMA-LAD, SVG-RPDA, SVG-OM1-OM2  . ST elevation myocardial infarction (STEMI) of inferolateral wall 09/09/2013    SVG-Om1-2 occluded - PCI in 2 locations; Occluded SVG-RCA & native RCA, LAD & native Cx occcluded.  EF ~20%  . Cardiomyopathy, ischemic 09/09/2013    EF ~20-25%; Large MI, existing occluded RCA & SVG-RCA; h/o CABG  . 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 CAD S/P percutaneous coronary angioplasty 09/09/2013    mid SVG-OM1-OM2 (prox LIMB) Rebel BMS 4.0 mm x 16 mm; sequential limb OM1-OM2: Rebel BMS 4.0 mm x 12 mm  . Combined systolic and diastolic congestive heart failure, NYHA class 3 09/09/2013    Echo post Inferolateral STEMI: Severely reduced LVEF ~20-35%; Akinesis of Inferolateral, Inferior & Inferoseptal walls; Grade 1 DDysfxn (initial LVEDP ~26 mmHg with SBP of 99 mHg)  . Essential hypertension 08/17/2012  . Hyperlipidemia with target LDL less than 70 08/17/2012  . Diabetes mellitus type 2 with complications 6/72/0947   Past Surgical History:  Past Surgical History  Procedure Laterality Date  . Coronary artery bypass graft    . Left heart catheterization with coronary angiogram N/A 09/09/2013    Procedure:  LEFT HEART CATHETERIZATION WITH CORONARY ANGIOGRAM;  Surgeon: Leonie Man, MD;  Location: [facility] CATH LAB;  Service: Cardiovascular;  Laterality: N/A;   HPI:  68 year old female with CAD, CABG 4, MI in 7/15, ischemic cardiomyopathy, hypertension, diabetes, hyperlipidemia presented to ER with cough and weakness for last 2 weeks and worsened in the last 2 days.  Chest x-ray showed increased his superior segment right lower lobe consolidation.  Dx CAP.  No documented hx of a prior dysphagia.    Assessment / Plan / Recommendation Clinical Impression  Pt's oropharyngeal swallow appears WFL throughout PO trials, with appropriate timing and coordination of breath/swallow. Pt has no subjective complaints related to swallowing. A delayed cough was noted after SLP had left the room, and when checked upon pt was not eating or drinking at that time. No further SLP f/u recommended at this time.    Aspiration Risk  Mild    Diet Recommendation Regular;Thin liquid   Liquid Administration via: Cup;Straw Medication Administration: Whole meds with liquid Supervision: Patient able to self feed Compensations: Slow rate;Small sips/bites Postural Changes and/or Swallow Maneuvers: Seated upright 90 degrees    Other  Recommendations Oral Care Recommendations: Oral care BID   Follow Up Recommendations  None    Frequency and Duration        Pertinent Vitals/Pain n/a    SLP Swallow Goals     Swallow Study Prior Functional Status  Type of Home: House Available Help at Discharge: Family;Available PRN/intermittently (dtr works)  General HPI: 68 year old female with CAD, CABG 4, MI in 7/15, ischemic cardiomyopathy, hypertension, diabetes, hyperlipidemia presented to ER with cough and weakness for last 2 weeks and worsened in the last 2 days.  Chest x-ray showed increased his superior segment right lower lobe consolidation.  Dx CAP.  No documented hx of a prior dysphagia.  Type of Study: Bedside swallow  evaluation Previous Swallow Assessment: none per records Diet Prior to this Study: NPO Temperature Spikes Noted: No Respiratory Status: Room air History of Recent Intubation: No Behavior/Cognition: Alert;Cooperative;Pleasant mood Oral Cavity - Dentition: Dentures, top;Dentures, bottom Self-Feeding Abilities: Able to feed self Patient Positioning: Upright in chair Baseline Vocal Quality: Clear    Oral/Motor/Sensory Function Overall Oral Motor/Sensory Function: Appears within functional limits for tasks assessed   Ice Chips Ice chips: Not tested   Thin Liquid Thin Liquid: Within functional limits Presentation: Self Fed;Straw    Nectar Thick Nectar Thick Liquid: Not tested   Honey Thick Honey Thick Liquid: Not tested   Puree Puree: Within functional limits Presentation: Self Fed;Spoon   Solid   GO    Solid: Impaired Presentation: Self Fed Pharyngeal Phase Impairments: Cough - Delayed        Germain Osgood, M.A. CCC-SLP [phone]  Germain Osgood 02/16/2014,2:07 PM

## 2014-02-17 DIAGNOSIS — R740 Nonspecific elevation of levels of transaminase and lactic acid dehydrogenase [LDH]: Secondary | ICD-10-CM

## 2014-02-17 LAB — COMPREHENSIVE METABOLIC PANEL
ALT: 692 U/L — ABNORMAL HIGH (ref 0–35)
ANION GAP: 8 (ref 5–15)
AST: 258 U/L — ABNORMAL HIGH (ref 0–37)
Albumin: 2.8 g/dL — ABNORMAL LOW (ref 3.5–5.2)
Alkaline Phosphatase: 148 U/L — ABNORMAL HIGH (ref 39–117)
BUN: 31 mg/dL — ABNORMAL HIGH (ref 6–23)
CALCIUM: 8 mg/dL — AB (ref 8.4–10.5)
CO2: 17 mmol/L — AB (ref 19–32)
CREATININE: 1.69 mg/dL — AB (ref 0.50–1.10)
Chloride: 114 mEq/L — ABNORMAL HIGH (ref 96–112)
GFR, EST AFRICAN AMERICAN: 35 mL/min — AB (ref >90)
GFR, EST NON AFRICAN AMERICAN: 30 mL/min — AB (ref >90)
GLUCOSE: 108 mg/dL — AB (ref 70–99)
Potassium: 3.4 mmol/L — ABNORMAL LOW (ref 3.5–5.1)
Sodium: 139 mmol/L (ref 135–145)
Total Bilirubin: 0.6 mg/dL (ref 0.3–1.2)
Total Protein: 5.2 g/dL — ABNORMAL LOW (ref 6.0–8.3)

## 2014-02-17 LAB — GLUCOSE, CAPILLARY
Glucose-Capillary: 59 mg/dL — ABNORMAL LOW (ref 70–99)
Glucose-Capillary: 123 mg/dL — ABNORMAL HIGH (ref 70–99)
Glucose-Capillary: 80 mg/dL (ref 70–99)
Glucose-Capillary: 111 mg/dL — ABNORMAL HIGH (ref 70–99)

## 2014-02-17 LAB — CBC
HCT: 32.6 % — ABNORMAL LOW (ref 36.0–46.0)
HEMOGLOBIN: 10.5 g/dL — AB (ref 12.0–15.0)
MCH: 29.2 pg (ref 26.0–34.0)
MCHC: 32.2 g/dL (ref 30.0–36.0)
MCV: 90.6 fL (ref 78.0–100.0)
PLATELETS: 128 10*3/uL — AB (ref 150–400)
RBC: 3.6 MIL/uL — AB (ref 3.87–5.11)
RDW: 16.6 % — ABNORMAL HIGH (ref 11.5–15.5)
WBC: 6.9 10*3/uL (ref 4.0–10.5)

## 2014-02-17 MED ORDER — DEXTROSE 50 % IV SOLN
INTRAVENOUS | Status: AC
Start: 2014-02-17 — End: 2014-02-17
  Administered 2014-02-17: 25 mL
  Filled 2014-02-17: qty 50

## 2014-02-17 MED ORDER — LEVOFLOXACIN 500 MG PO TABS: 500.0000 mg | ORAL_TABLET | Freq: Every day | ORAL | Status: DC

## 2014-02-17 MED ORDER — METOPROLOL SUCCINATE ER 100 MG PO TB24: 200.0000 mg | ORAL_TABLET | Freq: Every day | ORAL | Status: DC

## 2014-02-17 NOTE — Care Management Note (Signed)
    Page 1 of 1   02/17/2014     10:51:41 AM CARE MANAGEMENT NOTE 02/17/2014  Patient:  Jo Lynn, Jo Lynn   Account Number:  [id number]  Date Initiated:  02/17/2014  Documentation initiated by:  Tomi Bamberger  Subjective/Objective Assessment:   dx chf, transininitis  admit     Action/Plan:   pt eval- no pt f/u   Anticipated DC Date:  02/17/2014   Anticipated DC Plan:  [place]  CM consult      Choice offered to / List presented to:             Status of service:  Completed, signed off Medicare Important Message given?  NO (If response is "NO", the following Medicare IM given date fields will be blank) Date Medicare IM given:   Medicare IM given by:   Date Additional Medicare IM given:   Additional Medicare IM given by:    Discharge Disposition:  HOME/SELF CARE  Per UR Regulation:  Reviewed for med. necessity/level of care/duration of stay  If discussed at [place] of Stay Meetings, dates discussed:    Comments:  02/17/14 [place], BSN [phone] patient for dc today, no needs anticipated.

## 2014-02-17 NOTE — Progress Notes (Signed)
Patient's CBG 56. Given 4oz orange juice. Rechecked and was 78. Notified Baltazar Najjar, NP. No new orders given.

## 2014-02-17 NOTE — Progress Notes (Signed)
Patient telemetry strips show non-sustained v-tach relabeled to PVCs overnight and vent bigeminy. No notifications received from central monitoring. Spoke with telemetry tech this am who stated that it was nonsustained and "not real." Patient stable throughout night, without any complaints. No chest pain or shortness of breath noted. V/s stable. Notified Baltazar Najjar, NP - no new orders given at this time. Will continue to monitor patient.

## 2014-02-17 NOTE — Progress Notes (Signed)
Hypoglycemic Event  CBG: 59   Treatment: D50 IV 25 mL  Symptoms: None  Follow-up CBG: [id number]  CBG Result:123  Possible Reasons for Event: Unknown  Comments/MD notified:Kirby, NP    [place] Silos, Janika Jedlicka L  Remember to initiate Hypoglycemia Order Set & complete

## 2014-02-17 NOTE — Discharge Summary (Signed)
Physician Discharge Summary  Jo Lynn FKC:[id number] DOB: 1945/04/18 DOA: 02/15/2014  PCP: Chevis Pretty, FNP  Admit date: 02/15/2014 Discharge date: 02/17/2014  Time spent: >35 minutes  Recommendations for Outpatient Follow-up:  F/u with PCP in 2 days with labs  F/u with cardiology for ICD on Monday   Discharge Diagnoses:  Principal Problem:   CAP (community acquired pneumonia) Active Problems:   Essential hypertension   Hyperlipidemia with target LDL less than 70   Diabetes mellitus type 2 with complications   Cardiomyopathy, ischemic - EF ~20-25%; Large MI, existing occluded RCA & SVG-RCA; h/o CABG   Transaminitis   Discharge Condition: stable   Diet recommendation: low sodium, DM  Filed Weights   02/16/14 0454  Weight: 89.449 kg (197 lb 3.2 oz)    History of present illness:  Patient is a 68 year old female with CAD, CABG 4, MI in 7/15, ischemic cardiomyopathy, hypertension, diabetes, hyperlipidemia presented to ER with cough and weakness for last 2 weeks and worsened in the last 2 days. Patient was seen in the ER on 12/21 for the same symptoms. She also reported associated chest congestion, rhinorrhea, difficulty breathing and orthopnea in the posterior days. She reported diarrhea 2 days before and vomiting due to coughing. Chest x-ray had shown right mid lung pneumonia, patient was given a Zithromax however she has only taken 2 tablets of Zithromax so far.   Hospital Course:  1. CAP; CXR: RIGHT midlung zone and RIGHT lower lobe patchy airspace opacities -clinically stable, afebrile; blood cultures: NGTD; patient requesting to go home today to complete the treatment as outpatient; d/w patient, her daughter, recommended to cont inpatient care, f/u LFTs; but she preferred to go home  -atx changed to PO to complete treatment regimen, f/u with PCP in 2-3 days; recommended to f/u CXR in 4 weeks to document resolution, f/u on pneumonia    2. Chronic CHF,  clinically euvolemic; mild leg edema, lung unremarkable; no JVD; cont home regimen, diuretics, BB, NTG, ARB; patient has cardiology appointment on this Monday to discuss ICD 3. CAD h/o CABG; no chest pains, cont home regimen; statins on hold due to elevated LFTs 4. DM, hold gliburide due to mild hypoglycemia/CKD; f/u with PCP next week to discuss further treatment options; 5. Elevated LFTs, no GI symptoms; [place]: gallstones but HIDA neg; -d/w patient, she prefers to f/u with surgery as outpatient; hold statins, f/u LFTs in 2 days   6. Mild hypotension prior to admission probable due to mild dehydration (patient was taking too much BP meds);  -BP is stable on current regimen, adjusted BB as per last cardiology note    Procedures:  HIDA: neg  (i.e. Studies not automatically included, echos, thoracentesis, etc; not x-rays)  Consultations:  none  Discharge Exam: Filed Vitals:   02/17/14 0527  BP: 116/59  Pulse: 88  Temp: 99.5 F (37.5 C)  Resp: 16    General: alert Cardiovascular: s1,s2 rrr Respiratory: CTA BL  Discharge Instructions  Discharge Instructions    Diet - low sodium heart healthy    Complete by:  As directed      Discharge instructions    Complete by:  As directed   Please follow up with primary care doctor in 2 days for blood work  Please follow up with cardiology as scheduled     Increase activity slowly    Complete by:  As directed             Medication List    STOP taking  these medications        atorvastatin 40 MG tablet  Commonly known as:  LIPITOR     azithromycin 250 MG tablet  Commonly known as:  ZITHROMAX     glyBURIDE 5 MG tablet  Commonly known as:  DIABETA     metoprolol 50 MG tablet  Commonly known as:  LOPRESSOR      TAKE these medications        aspirin 81 MG EC tablet  Take 1 tablet (81 mg total) by mouth daily.     benzonatate 100 MG capsule  Commonly known as:  TESSALON  Take 1 capsule (100 mg total) by mouth 2 (two) times  daily as needed for cough.     clopidogrel 75 MG tablet  Commonly known as:  PLAVIX  Take 1 tablet (75 mg total) by mouth daily.     diclofenac sodium 1 % Gel  Commonly known as:  VOLTAREN  Apply 2 g topically 4 (four) times daily.     furosemide 40 MG tablet  Commonly known as:  LASIX  Take 20 mg by mouth daily.     levofloxacin 500 MG tablet  Commonly known as:  LEVAQUIN  Take 1 tablet (500 mg total) by mouth daily.     losartan 50 MG tablet  Commonly known as:  COZAAR  Take 1 tablet (50 mg total) by mouth daily.     metoprolol succinate 100 MG 24 hr tablet  Commonly known as:  TOPROL-XL  Take 2 tablets (200 mg total) by mouth daily. Take with or immediately following a meal.     nitroGLYCERIN 0.4 MG SL tablet  Commonly known as:  NITROSTAT  Place 1 tablet (0.4 mg total) under the tongue every 5 (five) minutes as needed for chest pain.     sitaGLIPtin 100 MG tablet  Commonly known as:  JANUVIA  Take 1 tablet (100 mg total) by mouth daily.       No Known Allergies     Follow-up Information    Follow up with Chevis Pretty, FNP In 2 days.   Specialty:  Nurse Practitioner   Contact information:   [place] [address] [phone]        The results of significant diagnostics from this hospitalization (including imaging, microbiology, ancillary and laboratory) are listed below for reference.    Significant Diagnostic Studies: Dg Chest 2 View  02/15/2014   CLINICAL DATA:  Cough and shortness of breath 2 weeks  EXAM: CHEST  2 VIEW  COMPARISON:  02/14/2014  FINDINGS: Evidence of CABG reidentified with moderate cardiomegaly. Increased superior segment right lower lobe consolidation is identified. No pleural effusion. No acute osseous finding.  IMPRESSION: Increased superior segment right lower lobe consolidation most compatible with pneumonia given the clinical presentation. Followup to radiographic resolution is recommended to exclude  underlying malignancy, which may take 4-6 weeks.   Electronically Signed   By: Conchita Paris M.D.   On: 02/15/2014 22:36   Dg Chest 2 View  02/14/2014   CLINICAL DATA:  Severe dry nonproductive cough for 2 weeks, altered mental status for 2 days.  EXAM: CHEST  2 VIEW  COMPARISON:  Chest radiograph September 10, 2013  FINDINGS: Cardiac silhouette appears moderately enlarged, mediastinal silhouette is nonsuspicious. Patchy RIGHT lower lobe airspace opacity with small RIGHT pleural effusion. RIGHT midlung zone focal airspace opacities. Mild pulmonary vascular congestion. Rounded density in RIGHT hilum suggests vascular shadows. Status post median sternotomy for CABG. No  pneumothorax. Soft tissue planes and included osseous structures are nonsuspicious, moderate to severe mid thoracic degenerative discs.  IMPRESSION: RIGHT midlung zone and RIGHT lower lobe patchy airspace opacities may reflect pneumonia with small RIGHT pleural effusion. Recommend followup chest radiograph after treatment to verify improvement.  Cardiomegaly, mild pulmonary vascular congestion.   Electronically Signed   By: Elon Alas   On: 02/14/2014 03:43   Nm Hepatobiliary Including Gb  02/16/2014   CLINICAL DATA:  Transaminitis  EXAM: NUCLEAR MEDICINE HEPATOBILIARY IMAGING WITH GALLBLADDER EF  Views: Anterior, right lateral right upper quadrant  Radionuclide:  Technetium 68m Choletec  Dose:  5.0 mCi  Route of administration: Intravenous  COMPARISON:  None.  FINDINGS: Liver uptake of radiotracer is normal. There is prompt visualization of gallbladder and small bowel, indicating patency of the cystic and common bile ducts. A weight based dose, 1.79 mcg, of CCK was administered intravenously with calculation of the computer generated ejection fraction of radiotracer from the gallbladder. The patient did not experience clinical symptoms with the CCK administration. The computer generated ejection fraction of radiotracer from the gallbladder  is normal at 66.0%, normal greater than 38%.  IMPRESSION: Study within normal limits.   Electronically Signed   By: Lowella Grip M.D.   On: 02/16/2014 13:54   US Abdomen Complete  02/16/2014   CLINICAL DATA:  Elevated liver function tests  EXAM: ULTRASOUND ABDOMEN COMPLETE  COMPARISON:  None.  FINDINGS: Gallbladder: Gallstones are identified, largest 1.8 cm. Gallbladder wall thickness at upper limits of normal but likely in part due to under distention. No sonographic Murphy sign. No pericholecystic fluid.  Common bile duct: Diameter: 4 mm  Liver: No focal lesion identified. Within normal limits in parenchymal echogenicity.  IVC: No abnormality visualized.  Pancreas: Obscured by bowel gas  Spleen: Size and appearance within normal limits.  Right Kidney: Length: 11.4 cm. Increased renal cortical echogenicity with mild renal cortical thinning. No hydronephrosis.  Left Kidney: Length: 10.7 cm. No hydronephrosis. Simple appearing mid renal cortical cyst 6.4 x 5.9 x 5.6 cm. Increased renal cortical echogenicity without focal mass otherwise.  Abdominal aorta: No aneurysm visualized.  Other findings: Moderate right pleural effusion partly visualized.  IMPRESSION: Gallstones without other sonographic evidence for acute cholecystitis.  No acute intra-abdominal pathology.   Electronically Signed   By: Conchita Paris M.D.   On: 02/16/2014 01:45    Microbiology: Recent Results (from the past 240 hour(s))  Culture, blood (routine x 2) Call MD if unable to obtain prior to antibiotics being given     Status: None (Preliminary result)   Collection Time: 02/16/14 10:05 AM  Result Value Ref Range Status   Specimen Description BLOOD RIGHT HAND  Final   Special Requests   Final    BOTTLES DRAWN AEROBIC AND ANAEROBIC 10CC AER 5CC ANA   Culture  Setup Time   Final    02/16/2014 14:47 Performed at [facility]    Culture   Final           BLOOD CULTURE RECEIVED NO GROWTH TO DATE CULTURE WILL BE HELD FOR 5  DAYS BEFORE ISSUING A FINAL NEGATIVE REPORT Performed at [facility]    Report Status PENDING  Incomplete  Culture, blood (routine x 2) Call MD if unable to obtain prior to antibiotics being given     Status: None (Preliminary result)   Collection Time: 02/16/14 10:10 AM  Result Value Ref Range Status   Specimen Description BLOOD LEFT HAND  Final  Special Requests BOTTLES DRAWN AEROBIC ONLY 10CC  Final   Culture  Setup Time   Final    02/16/2014 14:48 Performed at [facility]    Culture   Final           BLOOD CULTURE RECEIVED NO GROWTH TO DATE CULTURE WILL BE HELD FOR 5 DAYS BEFORE ISSUING A FINAL NEGATIVE REPORT Performed at [facility]    Report Status PENDING  Incomplete     Labs: Basic Metabolic Panel:  Recent Labs Lab 02/15/14 2147 02/16/14 1005 02/17/14 0640  NA 141  --  139  K 4.4  --  3.4*  CL 111  --  114*  CO2 18*  --  17*  GLUCOSE 260*  --  108*  BUN 44*  --  31*  CREATININE 2.42* 2.13* 1.69*  CALCIUM 9.2  --  8.0*   Liver Function Tests:  Recent Labs Lab 02/15/14 2147 02/17/14 0640  AST 428* 258*  ALT 800* 692*  ALKPHOS 212* 148*  BILITOT 1.4* 0.6  PROT 6.4 5.2*  ALBUMIN 3.5 2.8*   No results for input(s): LIPASE, AMYLASE in the last 168 hours. No results for input(s): AMMONIA in the last 168 hours. CBC:  Recent Labs Lab 02/15/14 2147 02/16/14 1005 02/17/14 0640  WBC 10.2 9.2 6.9  NEUTROABS 8.1*  --   --   HGB 11.3* 11.0* 10.5*  HCT 34.9* 33.5* 32.6*  MCV 87.3 89.8 90.6  PLT 178 145* 128*   Cardiac Enzymes:  Recent Labs Lab 02/15/14 2147  TROPONINI <0.03   BNP: BNP (last 3 results)  Recent Labs  09/09/13 1055 09/11/13 0850 09/13/13 0850  PROBNP 2561.0* 9429.0* 2838.0*   CBG:  Recent Labs Lab 02/16/14 2257 02/16/14 2326 02/17/14 0524 02/17/14 0555 02/17/14 0803  GLUCAP 56* 78 59* 123* 80       Signed:  Rowe Clack N  [facility] 02/17/2014, 10:01 AM

## 2014-02-17 NOTE — Progress Notes (Addendum)
Patient had 12 beat of vtach at 0526 per central telemetry monitoring, now in normal sinus rhythm with PVCs kirby np notified. RN taken v/s at 0527 and was at patient bedside , v/s were stable. Pt alert and oriented, asymptomatic without complaints. No new orders given per NP.

## 2014-02-17 NOTE — Progress Notes (Signed)
[facility] discharged Home per MD order.  Discharge instructions reviewed and discussed with the patient and pt. daughter, all questions and concerns answered. Copy of instructions, care notes for new medications & diagnosis  and scripts given to patient.    Medication List    STOP taking these medications        atorvastatin 40 MG tablet  Commonly known as:  LIPITOR     azithromycin 250 MG tablet  Commonly known as:  ZITHROMAX     glyBURIDE 5 MG tablet  Commonly known as:  DIABETA     metoprolol 50 MG tablet  Commonly known as:  LOPRESSOR      TAKE these medications        aspirin 81 MG EC tablet  Take 1 tablet (81 mg total) by mouth daily.     benzonatate 100 MG capsule  Commonly known as:  TESSALON  Take 1 capsule (100 mg total) by mouth 2 (two) times daily as needed for cough.     clopidogrel 75 MG tablet  Commonly known as:  PLAVIX  Take 1 tablet (75 mg total) by mouth daily.     diclofenac sodium 1 % Gel  Commonly known as:  VOLTAREN  Apply 2 g topically 4 (four) times daily.     furosemide 40 MG tablet  Commonly known as:  LASIX  Take 20 mg by mouth daily.     levofloxacin 500 MG tablet  Commonly known as:  LEVAQUIN  Take 1 tablet (500 mg total) by mouth daily.     losartan 50 MG tablet  Commonly known as:  COZAAR  Take 1 tablet (50 mg total) by mouth daily.     metoprolol succinate 100 MG 24 hr tablet  Commonly known as:  TOPROL-XL  Take 2 tablets (200 mg total) by mouth daily. Take with or immediately following a meal.     nitroGLYCERIN 0.4 MG SL tablet  Commonly known as:  NITROSTAT  Place 1 tablet (0.4 mg total) under the tongue every 5 (five) minutes as needed for chest pain.     sitaGLIPtin 100 MG tablet  Commonly known as:  JANUVIA  Take 1 tablet (100 mg total) by mouth daily.        Patients skin is clean, dry and intact, no evidence of skin break down. IV site discontinued and catheter remains intact. Site without signs and symptoms  of complications. Dressing and pressure applied.  Patient escorted to car by NT in a wheelchair,  no distress noted upon discharge.  Alphonzo Lemmings, RN

## 2014-02-19 ENCOUNTER — Emergency Department (HOSPITAL_COMMUNITY)
Admission: EM | Admit: 2014-02-19 | Discharge: 2014-02-19 | Disposition: A | Discharge status: Home / Self Care | Payer: BC Managed Care – PPO | Attending: Emergency Medicine | Admitting: Emergency Medicine

## 2014-02-19 ENCOUNTER — Other Ambulatory Visit: Payer: Self-pay

## 2014-02-19 ENCOUNTER — Encounter (HOSPITAL_COMMUNITY): Payer: Self-pay | Admitting: Family Medicine

## 2014-02-19 ENCOUNTER — Emergency Department (HOSPITAL_COMMUNITY): Payer: BC Managed Care – PPO

## 2014-02-19 DIAGNOSIS — R600 Localized edema: Secondary | ICD-10-CM

## 2014-02-19 DIAGNOSIS — E785 Hyperlipidemia, unspecified: Secondary | ICD-10-CM | POA: Insufficient documentation

## 2014-02-19 DIAGNOSIS — R609 Edema, unspecified: Secondary | ICD-10-CM

## 2014-02-19 DIAGNOSIS — I252 Old myocardial infarction: Secondary | ICD-10-CM | POA: Insufficient documentation

## 2014-02-19 DIAGNOSIS — R05 Cough: Secondary | ICD-10-CM

## 2014-02-19 DIAGNOSIS — Z79899 Other long term (current) drug therapy: Secondary | ICD-10-CM | POA: Insufficient documentation

## 2014-02-19 DIAGNOSIS — Z87891 Personal history of nicotine dependence: Secondary | ICD-10-CM | POA: Insufficient documentation

## 2014-02-19 DIAGNOSIS — Z951 Presence of aortocoronary bypass graft: Secondary | ICD-10-CM | POA: Insufficient documentation

## 2014-02-19 DIAGNOSIS — I10 Essential (primary) hypertension: Secondary | ICD-10-CM | POA: Diagnosis not present

## 2014-02-19 DIAGNOSIS — Z7982 Long term (current) use of aspirin: Secondary | ICD-10-CM | POA: Diagnosis not present

## 2014-02-19 DIAGNOSIS — I2510 Atherosclerotic heart disease of native coronary artery without angina pectoris: Secondary | ICD-10-CM | POA: Diagnosis not present

## 2014-02-19 DIAGNOSIS — Z791 Long term (current) use of non-steroidal anti-inflammatories (NSAID): Secondary | ICD-10-CM | POA: Insufficient documentation

## 2014-02-19 DIAGNOSIS — E119 Type 2 diabetes mellitus without complications: Secondary | ICD-10-CM | POA: Diagnosis not present

## 2014-02-19 DIAGNOSIS — I2119 ST elevation (STEMI) myocardial infarction involving other coronary artery of inferior wall: Secondary | ICD-10-CM | POA: Insufficient documentation

## 2014-02-19 DIAGNOSIS — I5040 Unspecified combined systolic (congestive) and diastolic (congestive) heart failure: Secondary | ICD-10-CM | POA: Diagnosis not present

## 2014-02-19 DIAGNOSIS — R2243 Localized swelling, mass and lump, lower limb, bilateral: Secondary | ICD-10-CM | POA: Diagnosis present

## 2014-02-19 DIAGNOSIS — R059 Cough, unspecified: Secondary | ICD-10-CM

## 2014-02-19 LAB — CBC WITH DIFFERENTIAL/PLATELET
Basophils Absolute: 0 10*3/uL (ref 0.0–0.1)
Basophils Relative: 0 % (ref 0–1)
EOS ABS: 0 10*3/uL (ref 0.0–0.7)
EOS PCT: 0 % (ref 0–5)
HEMATOCRIT: 32.1 % — AB (ref 36.0–46.0)
Hemoglobin: 10.3 g/dL — ABNORMAL LOW (ref 12.0–15.0)
LYMPHS ABS: 1.2 10*3/uL (ref 0.7–4.0)
Lymphocytes Relative: 17 % (ref 12–46)
MCH: 28.4 pg (ref 26.0–34.0)
MCHC: 32.1 g/dL (ref 30.0–36.0)
MCV: 88.4 fL (ref 78.0–100.0)
MONOS PCT: 8 % (ref 3–12)
Monocytes Absolute: 0.6 10*3/uL (ref 0.1–1.0)
NEUTROS PCT: 75 % (ref 43–77)
Neutro Abs: 5.3 10*3/uL (ref 1.7–7.7)
Platelets: 134 10*3/uL — ABNORMAL LOW (ref 150–400)
RBC: 3.63 MIL/uL — AB (ref 3.87–5.11)
RDW: 16.5 % — ABNORMAL HIGH (ref 11.5–15.5)
WBC: 7.1 10*3/uL (ref 4.0–10.5)

## 2014-02-19 LAB — BASIC METABOLIC PANEL
Anion gap: 9 (ref 5–15)
BUN: 24 mg/dL — ABNORMAL HIGH (ref 6–23)
CO2: 18 mmol/L — AB (ref 19–32)
Calcium: 8.4 mg/dL (ref 8.4–10.5)
Chloride: 112 mEq/L (ref 96–112)
Creatinine, Ser: 1.55 mg/dL — ABNORMAL HIGH (ref 0.50–1.10)
GFR calc Af Amer: 38 mL/min — ABNORMAL LOW (ref >90)
GFR calc non Af Amer: 33 mL/min — ABNORMAL LOW (ref >90)
GLUCOSE: 271 mg/dL — AB (ref 70–99)
POTASSIUM: 4.3 mmol/L (ref 3.5–5.1)
Sodium: 139 mmol/L (ref 135–145)

## 2014-02-19 LAB — BRAIN NATRIURETIC PEPTIDE: B Natriuretic Peptide: 4653 pg/mL — ABNORMAL HIGH (ref 0.0–100.0)

## 2014-02-19 LAB — TROPONIN I

## 2014-02-19 NOTE — ED Notes (Signed)
Patient transported to X-ray 

## 2014-02-19 NOTE — ED Provider Notes (Signed)
CSN: [id number]     Arrival date & time 02/19/14  0909 History   First MD Initiated Contact with Patient 02/19/14 1004     Chief Complaint  Patient presents with  . Leg Swelling   Jo Lynn is a 68 y.o. female with history of MI, CABG, ischemic cardiomyopathy and  recent admission for pneumonia who presents complaining of increased bilateral leg swelling as well as shortness of breath only when lying down. Patient reports she's had lower extremity edema for a long time but is worse in the past 2 days. Patient was recently admitted here on 02/16/2014 for pneumonia. Patient reports her Lasix dose was cut in half after her last admission to 20 mg daily. Patient denies current shortness of breath. She reports she's having trouble sleeping and wanting something to help her with sleep. Patient reports she has a slight nonproductive cough that has persisted since october. Patient is due to have a defib/pacemaker placed on 02/21/2014 by Dr. Lovena Le. Patient reports she still taking her Levaquin as prescribed. Patient denies fevers, chills, chest pain, palpitations, dyspnea on exertion, weakness, dizziness, lightheadedness, calve tenderness, rashes, numbness, or tingling.  (Consider location/radiation/quality/duration/timing/severity/associated sxs/prior Treatment) HPI  Past Medical History  Diagnosis Date  . Myocardial infarction 10/2002    - MV CAD --> Referred for CABG (no follow-up post CABG)  . 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 Lucianne Lei Trigt: LIMA-LAD, SVG-RPDA, SVG-OM1-OM2  . ST elevation myocardial infarction (STEMI) of inferolateral wall 09/09/2013    SVG-Om1-2 occluded - PCI in 2 locations; Occluded SVG-RCA & native RCA, LAD & native Cx occcluded.  EF ~20%  . Cardiomyopathy, ischemic 09/09/2013    EF ~20-25%; Large MI, existing occluded RCA & SVG-RCA; h/o  CABG  . 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 CAD S/P percutaneous coronary angioplasty 09/09/2013    mid SVG-OM1-OM2 (prox LIMB) Rebel BMS 4.0 mm x 16 mm; sequential limb OM1-OM2: Rebel BMS 4.0 mm x 12 mm  . Combined systolic and diastolic congestive heart failure, NYHA class 3 09/09/2013    Echo post Inferolateral STEMI: Severely reduced LVEF ~20-35%; Akinesis of Inferolateral, Inferior & Inferoseptal walls; Grade 1 DDysfxn (initial LVEDP ~26 mmHg with SBP of 99 mHg)  . Essential hypertension 08/17/2012  . Hyperlipidemia with target LDL less than 70 08/17/2012  . Diabetes mellitus type 2 with complications 5/36/1443   Past Surgical History  Procedure Laterality Date  . Coronary artery bypass graft    . Left heart catheterization with coronary angiogram N/A 09/09/2013    Procedure: LEFT HEART CATHETERIZATION WITH CORONARY ANGIOGRAM;  Surgeon: Leonie Man, MD;  Location: [facility] CATH LAB;  Service: Cardiovascular;  Laterality: N/A;   Family History  Problem Relation Age of Onset  . Diabetes Mother   . Diabetes Father    History  Substance Use Topics  . Smoking status: Former Research scientist (life sciences)  . Smokeless tobacco: Not on file  . Alcohol Use: No   OB History    No data available     Review of Systems  Constitutional: Negative for fever and chills.  HENT: Negative for congestion, ear pain, sneezing, sore throat and trouble swallowing.   Eyes: Negative for pain and visual disturbance.  Respiratory: Positive for cough and shortness of breath. Negative for wheezing.   Cardiovascular: Positive for leg swelling. Negative for chest pain and palpitations.  Gastrointestinal: Negative for nausea, vomiting, abdominal pain and diarrhea.  Genitourinary: Negative for dysuria,  frequency, hematuria and difficulty urinating.  Musculoskeletal: Negative for back pain and neck pain.  Skin: Negative for rash and wound.  Neurological: Negative for dizziness, weakness, light-headedness, numbness and headaches.  All other systems reviewed and are negative.     Allergies  Review of patient's allergies indicates no known allergies.  Home Medications   Prior to Admission medications   Medication Sig Start Date End Date Taking? Authorizing Provider  aspirin EC 81 MG EC tablet Take 1 tablet (81 mg total) by mouth daily. 09/14/13  Yes Almyra Deforest, PA  benzonatate (TESSALON) 100 MG capsule Take 1 capsule (100 mg total) by mouth 2 (two) times daily as needed for cough. 02/12/14  Yes Mary-Margaret Hassell Done, FNP  clopidogrel (PLAVIX) 75 MG tablet Take 1 tablet (75 mg total) by mouth daily. 01/10/14  Yes Mary-Margaret Hassell Done, FNP  diclofenac sodium (VOLTAREN) 1 % GEL Apply 2 g topically 4 (four) times daily. 11/14/13  Yes Mary-Margaret Hassell Done, FNP  furosemide (LASIX) 40 MG tablet Take 20 mg by mouth daily.  09/14/13  Yes Almyra Deforest, PA  levofloxacin (LEVAQUIN) 500 MG tablet Take 1 tablet (500 mg total) by mouth daily. 02/17/14  Yes Kinnie Feil, MD  losartan (COZAAR) 50 MG tablet Take 1 tablet (50 mg total) by mouth daily. 12/31/13  Yes Minus Breeding, MD  metoprolol succinate (TOPROL-XL) 100 MG 24 hr tablet Take 2 tablets (200 mg total) by mouth daily. Take with or immediately following a meal. 02/17/14  Yes Kinnie Feil, MD  sitaGLIPtin (JANUVIA) 100 MG tablet Take 1 tablet (100 mg total) by mouth daily. 01/10/14  Yes Mary-Margaret Hassell Done, FNP  nitroGLYCERIN (NITROSTAT) 0.4 MG SL tablet Place 1 tablet (0.4 mg total) under the tongue every 5 (five) minutes as needed for chest pain. 12/01/13   Minus Breeding, MD   BP 116/71 mmHg  Pulse 76  Temp(Src) 98 F (36.7 C) (Oral)  Resp 20  SpO2 100% Physical Exam  Constitutional: She appears well-developed and well-nourished. No  distress.  HENT:  Head: Normocephalic and atraumatic.  Right Ear: External ear normal.  Left Ear: External ear normal.  Nose: Nose normal.  Mouth/Throat: Oropharynx is clear and moist. No oropharyngeal exudate.  Eyes: Conjunctivae are normal. Pupils are equal, round, and reactive to light. Right eye exhibits no discharge. Left eye exhibits no discharge.  Neck: Neck supple. No JVD present.  Cardiovascular: Normal rate, regular rhythm, normal heart sounds and intact distal pulses.  Exam reveals no gallop and no friction rub.   No murmur heard. Bilateral radial pulses are intact. Bilateral posterior tibialis and dorsalis pedis pulses are intact  Pulmonary/Chest: Effort normal and breath sounds normal. No respiratory distress. She has no wheezes. She has no rales.  Abdominal: Soft. She exhibits no distension. There is no tenderness.  Musculoskeletal: Normal range of motion. She exhibits edema. She exhibits no tenderness.  Patient has 1+ bilateral pitting edema. Calves are equal in size. Calves are non-tender to palpation. Patient is able to ambulate without difficulty or assistance.   Lymphadenopathy:    She has no cervical adenopathy.  Neurological: She is alert. Coordination normal.  Patient has  good sensation in her bilateral lower extremities.   Skin: Skin is warm and dry. No rash noted. She is not diaphoretic. No erythema. No pallor.  Psychiatric: She has a normal mood and affect. Her behavior is normal.  Nursing note and vitals reviewed.   ED Course  Procedures (including critical care time) Labs Review Labs Reviewed  BASIC METABOLIC PANEL - Abnormal; Notable for the following:    CO2 18 (*)    Glucose, Bld 271 (*)    BUN 24 (*)    Creatinine, Ser 1.55 (*)    GFR calc non Af Amer 33 (*)    GFR calc Af Amer 38 (*)    All other components within normal limits  CBC WITH DIFFERENTIAL - Abnormal; Notable for the following:    RBC 3.63 (*)    Hemoglobin 10.3 (*)    HCT 32.1 (*)     RDW 16.5 (*)    Platelets 134 (*)    All other components within normal limits  BRAIN NATRIURETIC PEPTIDE - Abnormal; Notable for the following:    B Natriuretic Peptide 4653.0 (*)    All other components within normal limits  TROPONIN I    Imaging Review Dg Chest 2 View  02/19/2014   CLINICAL DATA:  Acute shortness of breath and cough for 2 days.  EXAM: CHEST - 2 VIEW  COMPARISON:  02/15/2014  FINDINGS: Prior coronary bypass changes again noted. Stable cardiomegaly with vascular congestion but no CHF or effusion. Negative for pneumothorax. Right midlung subpleural scarring suspected along the minor fissure. No significant airspace process, collapse or consolidation. Trachea is midline. Atherosclerotic tortuous aorta noted. Mild thoracic degenerative change.  IMPRESSION: Cardiomegaly without CHF or pneumonia  Right midlung subpleural scarring along the fissure.   Electronically Signed   By: Daryll Brod M.D.   On: 02/19/2014 14:19     EKG Interpretation   Date/Time:  Saturday February 19 2014 09:21:20 EST Ventricular Rate:  84 PR Interval:  136 QRS Duration: 106 QT Interval:  432 QTC Calculation: 510 R Axis:   62 Text Interpretation:  Normal sinus rhythm Inferior infarct , age  undetermined When compared with ECG of 02/15/2014 No significant change  was found Confirmed by [facility]  MD, KATHLEEN [phone]) on 02/19/2014  5:00:57 PM      Filed Vitals:   02/19/14 1331 02/19/14 1500 02/19/14 1526 02/19/14 1559  BP: 117/68 116/71  116/71  Pulse: 84 79 85 76  Temp:    98 F (36.7 C)  TempSrc:    Oral  Resp: 21 23  20   SpO2: 100% 100% 100% 100%     MDM   Meds given in ED:  Medications - No data to display  Discharge Medication List as of 02/19/2014  4:01 PM      Final diagnoses:  Edema  Bilateral edema of lower extremity  Cough   Nuri Branca is a 68 y.o. female with history of MI, CABG, ischemic cardiomyopathy and  recent admission for pneumonia who presents  complaining of increased bilateral leg swelling as well as shortness of breath only when lying down. Patient is afebrile and nontoxic-appearing. Patient is not tachypneic or tachycardic. She denies current SOB. Patient's oxygen saturation 100% on room air.  Patient's BMP shows slightly improved kidney function since her last lab work The patient's BNP has improved since her admission. Patient's CBC is unchanged from her previous. The patient has a negative troponin. CXR shows cardiomegaly without CHF or pneumonia. The patient likely  needs to increase her lasix to her previous dose of 40 mg daily. I discussed this patient with on-call cardiologist Dr. Gwenlyn Found who agrees that we can increase her dose of lasix to 40 mg daily. The patient will be revaluated by cardiology on on Monday 02/21/14.  Patient ambulated in the hallway with oxygen saturation of 100% prior to discharge. I advised the patient to increase her lasix dose to 40 mg once a day. Patient is currently taking half a pill. The patient reports she has plenty of Lasix at home and does not wish for prescription. I advised patient she can continue taking Tessalon Perles for cough. Advised patient she can take up to 200 mg at a time for cough. Advised patient to keep her appointment with cardiology on Monday, 02/21/2014. I advised her to follow-up with her primary care physician next week. Advised patient to return to the emergency department with new or worsening symptoms or new concerns. The patient and her family verbalized understanding and agreement with plan.  This patient was discussed with Dr. Thurnell Garbe who agrees with assessment and plan.      Hanley Hays, PA-C 02/19/14 [place], DO 02/21/14 1155

## 2014-02-19 NOTE — ED Notes (Signed)
Per pt sts 2 weeks of BLE swelling. sts taking abx for recent dx of PNA. sts SOB when she lays down. Denies chest pain.

## 2014-02-19 NOTE — Discharge Instructions (Signed)
Peripheral Edema You have swelling in your legs (peripheral edema). This swelling is due to excess accumulation of salt and water in your body. Edema may be a sign of heart, kidney or liver disease, or a side effect of a medication. It may also be due to problems in the leg veins. Elevating your legs and using special support stockings may be very helpful, if the cause of the swelling is due to poor venous circulation. Avoid long periods of standing, whatever the cause. Treatment of edema depends on identifying the cause. Chips, pretzels, pickles and other salty foods should be avoided. Restricting salt in your diet is almost always needed. Water pills (diuretics) are often used to remove the excess salt and water from your body via urine. These medicines prevent the kidney from reabsorbing sodium. This increases urine flow. Diuretic treatment may also result in lowering of potassium levels in your body. Potassium supplements may be needed if you have to use diuretics daily. Daily weights can help you keep track of your progress in clearing your edema. You should call your caregiver for follow up care as recommended. SEEK IMMEDIATE MEDICAL CARE IF:   You have increased swelling, pain, redness, or heat in your legs.  You develop shortness of breath, especially when lying down.  You develop chest or abdominal pain, weakness, or fainting.  You have a fever. Document Released: 03/21/2004 Document Revised: 05/06/2011 Document Reviewed: 03/01/2009 [facility] Patient Information 2015 [place], [place]. This information is not intended to replace advice given to you by your health care provider. Make sure you discuss any questions you have with your health care provider. Cough, Adult  A cough is a reflex that helps clear your throat and airways. It can help heal the body or may be a reaction to an irritated airway. A cough may only last 2 or 3 weeks (acute) or may last more than 8 weeks (chronic).   CAUSES Acute cough:  Viral or bacterial infections. Chronic cough:  Infections.  Allergies.  Asthma.  Post-nasal drip.  Smoking.  Heartburn or acid reflux.  Some medicines.  Chronic lung problems (COPD).  Cancer. SYMPTOMS   Cough.  Fever.  Chest pain.  Increased breathing rate.  High-pitched whistling sound when breathing (wheezing).  Colored mucus that you cough up (sputum). TREATMENT   A bacterial cough may be treated with antibiotic medicine.  A viral cough must run its course and will not respond to antibiotics.  Your caregiver may recommend other treatments if you have a chronic cough. HOME CARE INSTRUCTIONS   Only take over-the-counter or prescription medicines for pain, discomfort, or fever as directed by your caregiver. Use cough suppressants only as directed by your caregiver.  Use a cold steam vaporizer or humidifier in your bedroom or home to help loosen secretions.  Sleep in a semi-upright position if your cough is worse at night.  Rest as needed.  Stop smoking if you smoke. SEEK IMMEDIATE MEDICAL CARE IF:   You have pus in your sputum.  Your cough starts to worsen.  You cannot control your cough with suppressants and are losing sleep.  You begin coughing up blood.  You have difficulty breathing.  You develop pain which is getting worse or is uncontrolled with medicine.  You have a fever. MAKE SURE YOU:   Understand these instructions.  Will watch your condition.  Will get help right away if you are not doing well or get worse. Document Released: 08/10/2010 Document Revised: 05/06/2011 Document Reviewed: 08/10/2010 [employer]  Patient Information 2015 [place]. This information is not intended to replace advice given to you by your health care provider. Make sure you discuss any questions you have with your health care provider.

## 2014-02-20 DIAGNOSIS — I739 Peripheral vascular disease, unspecified: Secondary | ICD-10-CM | POA: Diagnosis not present

## 2014-02-20 DIAGNOSIS — I10 Essential (primary) hypertension: Secondary | ICD-10-CM | POA: Diagnosis not present

## 2014-02-20 DIAGNOSIS — Z87891 Personal history of nicotine dependence: Secondary | ICD-10-CM | POA: Diagnosis not present

## 2014-02-20 DIAGNOSIS — E118 Type 2 diabetes mellitus with unspecified complications: Secondary | ICD-10-CM | POA: Diagnosis not present

## 2014-02-20 DIAGNOSIS — Z951 Presence of aortocoronary bypass graft: Secondary | ICD-10-CM | POA: Diagnosis not present

## 2014-02-20 DIAGNOSIS — Z9861 Coronary angioplasty status: Secondary | ICD-10-CM | POA: Diagnosis not present

## 2014-02-20 DIAGNOSIS — I5043 Acute on chronic combined systolic (congestive) and diastolic (congestive) heart failure: Secondary | ICD-10-CM | POA: Diagnosis not present

## 2014-02-20 DIAGNOSIS — I2510 Atherosclerotic heart disease of native coronary artery without angina pectoris: Secondary | ICD-10-CM | POA: Diagnosis not present

## 2014-02-20 DIAGNOSIS — Z7901 Long term (current) use of anticoagulants: Secondary | ICD-10-CM | POA: Diagnosis not present

## 2014-02-20 DIAGNOSIS — E785 Hyperlipidemia, unspecified: Secondary | ICD-10-CM | POA: Diagnosis not present

## 2014-02-20 DIAGNOSIS — Z7982 Long term (current) use of aspirin: Secondary | ICD-10-CM | POA: Diagnosis not present

## 2014-02-20 DIAGNOSIS — I252 Old myocardial infarction: Secondary | ICD-10-CM | POA: Diagnosis not present

## 2014-02-20 DIAGNOSIS — I255 Ischemic cardiomyopathy: Secondary | ICD-10-CM | POA: Diagnosis not present

## 2014-02-20 MED ORDER — SODIUM CHLORIDE 0.9 % IV SOLN: INTRAVENOUS | Status: DC

## 2014-02-20 MED ORDER — CEFAZOLIN SODIUM-DEXTROSE 2-3 GM-% IV SOLR: 2.0000 g | INTRAVENOUS | Status: DC

## 2014-02-20 MED ORDER — SODIUM CHLORIDE 0.9 % IR SOLN
80.0000 mg | Status: DC
  Filled 2014-02-20: qty 2

## 2014-02-21 ENCOUNTER — Encounter (HOSPITAL_COMMUNITY): Payer: Self-pay | Admitting: *Deleted

## 2014-02-21 ENCOUNTER — Ambulatory Visit (HOSPITAL_COMMUNITY)
Admission: RE | Admit: 2014-02-21 | Discharge: 2014-02-22 | Disposition: A | Discharge status: Home / Self Care | Payer: BC Managed Care – PPO | Source: Ambulatory Visit | Attending: Internal Medicine | Admitting: Internal Medicine

## 2014-02-21 ENCOUNTER — Encounter (HOSPITAL_COMMUNITY)
Admission: RE | Disposition: A | Discharge status: Home / Self Care | Payer: Self-pay | Source: Ambulatory Visit | Attending: Internal Medicine

## 2014-02-21 DIAGNOSIS — Z87891 Personal history of nicotine dependence: Secondary | ICD-10-CM | POA: Insufficient documentation

## 2014-02-21 DIAGNOSIS — I2510 Atherosclerotic heart disease of native coronary artery without angina pectoris: Secondary | ICD-10-CM | POA: Insufficient documentation

## 2014-02-21 DIAGNOSIS — I5043 Acute on chronic combined systolic (congestive) and diastolic (congestive) heart failure: Secondary | ICD-10-CM | POA: Insufficient documentation

## 2014-02-21 DIAGNOSIS — I10 Essential (primary) hypertension: Secondary | ICD-10-CM | POA: Insufficient documentation

## 2014-02-21 DIAGNOSIS — Z7901 Long term (current) use of anticoagulants: Secondary | ICD-10-CM | POA: Insufficient documentation

## 2014-02-21 DIAGNOSIS — Z9861 Coronary angioplasty status: Secondary | ICD-10-CM | POA: Insufficient documentation

## 2014-02-21 DIAGNOSIS — Z9189 Other specified personal risk factors, not elsewhere classified: Secondary | ICD-10-CM

## 2014-02-21 DIAGNOSIS — Z95810 Presence of automatic (implantable) cardiac defibrillator: Secondary | ICD-10-CM | POA: Diagnosis present

## 2014-02-21 DIAGNOSIS — E785 Hyperlipidemia, unspecified: Secondary | ICD-10-CM | POA: Insufficient documentation

## 2014-02-21 DIAGNOSIS — I252 Old myocardial infarction: Secondary | ICD-10-CM | POA: Insufficient documentation

## 2014-02-21 DIAGNOSIS — I255 Ischemic cardiomyopathy: Secondary | ICD-10-CM | POA: Diagnosis not present

## 2014-02-21 DIAGNOSIS — I739 Peripheral vascular disease, unspecified: Secondary | ICD-10-CM | POA: Insufficient documentation

## 2014-02-21 DIAGNOSIS — I5022 Chronic systolic (congestive) heart failure: Secondary | ICD-10-CM

## 2014-02-21 DIAGNOSIS — Z7982 Long term (current) use of aspirin: Secondary | ICD-10-CM | POA: Insufficient documentation

## 2014-02-21 DIAGNOSIS — E118 Type 2 diabetes mellitus with unspecified complications: Secondary | ICD-10-CM | POA: Insufficient documentation

## 2014-02-21 DIAGNOSIS — Z951 Presence of aortocoronary bypass graft: Secondary | ICD-10-CM | POA: Insufficient documentation

## 2014-02-21 HISTORY — PX: IMPLANTABLE CARDIOVERTER DEFIBRILLATOR IMPLANT: SHX5860

## 2014-02-21 HISTORY — PX: IMPLANTABLE CARDIOVERTER DEFIBRILLATOR IMPLANT: SHX5473

## 2014-02-21 HISTORY — DX: Presence of automatic (implantable) cardiac defibrillator: Z95.810

## 2014-02-21 LAB — SURGICAL PCR SCREEN
MRSA, PCR: NEGATIVE
Staphylococcus aureus: NEGATIVE

## 2014-02-21 LAB — GLUCOSE, CAPILLARY
GLUCOSE-CAPILLARY: 296 mg/dL — AB (ref 70–99)
GLUCOSE-CAPILLARY: 248 mg/dL — AB (ref 70–99)
Glucose-Capillary: 220 mg/dL — ABNORMAL HIGH (ref 70–99)
Glucose-Capillary: 263 mg/dL — ABNORMAL HIGH (ref 70–99)
Glucose-Capillary: 154 mg/dL — ABNORMAL HIGH (ref 70–99)

## 2014-02-21 SURGERY — IMPLANTABLE CARDIOVERTER DEFIBRILLATOR IMPLANT
Anesthesia: LOCAL

## 2014-02-21 MED ORDER — LIDOCAINE HCL (PF) 1 % IJ SOLN
INTRAMUSCULAR | Status: AC
  Filled 2014-02-21: qty 60

## 2014-02-21 MED ORDER — ASPIRIN EC 81 MG PO TBEC
81.0000 mg | DELAYED_RELEASE_TABLET | Freq: Every day | ORAL | Status: DC
  Administered 2014-02-21 – 2014-02-22 (×2): 81 mg via ORAL
  Filled 2014-02-21 (×2): qty 1

## 2014-02-21 MED ORDER — ALPRAZOLAM 0.25 MG PO TABS
0.2500 mg | ORAL_TABLET | Freq: Two times a day (BID) | ORAL | Status: DC | PRN
  Administered 2014-02-21: 0.25 mg via ORAL
  Filled 2014-02-21: qty 1

## 2014-02-21 MED ORDER — ZOLPIDEM TARTRATE 5 MG PO TABS
5.0000 mg | ORAL_TABLET | Freq: Every evening | ORAL | Status: DC | PRN
Start: 2014-02-21 — End: 2014-02-22

## 2014-02-21 MED ORDER — LEVOFLOXACIN 500 MG PO TABS
500.0000 mg | ORAL_TABLET | Freq: Every day | ORAL | Status: DC
  Administered 2014-02-21 – 2014-02-22 (×2): 500 mg via ORAL
  Filled 2014-02-21 (×2): qty 1

## 2014-02-21 MED ORDER — GUAIFENESIN-DM 100-10 MG/5ML PO SYRP
5.0000 mL | ORAL_SOLUTION | ORAL | Status: DC | PRN
  Administered 2014-02-21: 5 mL via ORAL
  Filled 2014-02-21: qty 5

## 2014-02-21 MED ORDER — LOSARTAN POTASSIUM 50 MG PO TABS
50.0000 mg | ORAL_TABLET | Freq: Every day | ORAL | Status: DC
  Administered 2014-02-21: 50 mg via ORAL
  Filled 2014-02-21: qty 1

## 2014-02-21 MED ORDER — METOPROLOL SUCCINATE ER 25 MG PO TB24
25.0000 mg | ORAL_TABLET | Freq: Every day | ORAL | Status: DC
  Administered 2014-02-21 – 2014-02-22 (×2): 25 mg via ORAL
  Filled 2014-02-21 (×2): qty 1

## 2014-02-21 MED ORDER — ONDANSETRON HCL 4 MG/2ML IJ SOLN: 4.0000 mg | Freq: Four times a day (QID) | INTRAMUSCULAR | Status: DC | PRN

## 2014-02-21 MED ORDER — MIDAZOLAM HCL 5 MG/5ML IJ SOLN
INTRAMUSCULAR | Status: AC
  Filled 2014-02-21: qty 5

## 2014-02-21 MED ORDER — INSULIN ASPART 100 UNIT/ML ~~LOC~~ SOLN
0.0000 [IU] | Freq: Three times a day (TID) | SUBCUTANEOUS | Status: DC
  Administered 2014-02-21: 8 [IU] via SUBCUTANEOUS
  Administered 2014-02-22: 3 [IU] via SUBCUTANEOUS

## 2014-02-21 MED ORDER — BENZONATATE 100 MG PO CAPS: 100.0000 mg | ORAL_CAPSULE | Freq: Two times a day (BID) | ORAL | Status: DC | PRN

## 2014-02-21 MED ORDER — ACETAMINOPHEN 325 MG PO TABS: 325.0000 mg | ORAL_TABLET | ORAL | Status: DC | PRN

## 2014-02-21 MED ORDER — FUROSEMIDE 10 MG/ML IJ SOLN
INTRAMUSCULAR | Status: AC
  Filled 2014-02-21: qty 4

## 2014-02-21 MED ORDER — NITROGLYCERIN 0.4 MG SL SUBL: 0.4000 mg | SUBLINGUAL_TABLET | SUBLINGUAL | Status: DC | PRN

## 2014-02-21 MED ORDER — CEFAZOLIN SODIUM 1-5 GM-% IV SOLN
1.0000 g | Freq: Four times a day (QID) | INTRAVENOUS | Status: AC
  Administered 2014-02-21 – 2014-02-22 (×3): 1 g via INTRAVENOUS
  Filled 2014-02-21 (×3): qty 50

## 2014-02-21 MED ORDER — CEFAZOLIN SODIUM-DEXTROSE 2-3 GM-% IV SOLR
INTRAVENOUS | Status: AC
  Filled 2014-02-21: qty 50

## 2014-02-21 MED ORDER — INSULIN ASPART 100 UNIT/ML ~~LOC~~ SOLN: 0.0000 [IU] | Freq: Every day | SUBCUTANEOUS | Status: DC

## 2014-02-21 MED ORDER — CHLORHEXIDINE GLUCONATE 4 % EX LIQD
60.0000 mL | Freq: Once | CUTANEOUS | Status: DC
  Filled 2014-02-21: qty 60

## 2014-02-21 MED ORDER — MUPIROCIN 2 % EX OINT
1.0000 "application " | TOPICAL_OINTMENT | Freq: Once | CUTANEOUS | Status: AC
  Administered 2014-02-21: 1 via TOPICAL
  Filled 2014-02-21: qty 22

## 2014-02-21 MED ORDER — HEPARIN (PORCINE) IN NACL 2-0.9 UNIT/ML-% IJ SOLN
INTRAMUSCULAR | Status: AC
  Filled 2014-02-21: qty 500

## 2014-02-21 MED ORDER — LIDOCAINE HCL (PF) 1 % IJ SOLN
INTRAMUSCULAR | Status: AC
  Filled 2014-02-21: qty 30

## 2014-02-21 MED ORDER — INSULIN ASPART 100 UNIT/ML ~~LOC~~ SOLN: 0.0000 [IU] | Freq: Three times a day (TID) | SUBCUTANEOUS | Status: DC

## 2014-02-21 MED ORDER — LINAGLIPTIN 5 MG PO TABS
5.0000 mg | ORAL_TABLET | Freq: Every day | ORAL | Status: DC
  Administered 2014-02-21 – 2014-02-22 (×2): 5 mg via ORAL
  Filled 2014-02-21 (×2): qty 1

## 2014-02-21 MED ORDER — CLOPIDOGREL BISULFATE 75 MG PO TABS
75.0000 mg | ORAL_TABLET | Freq: Every day | ORAL | Status: DC
  Administered 2014-02-21 – 2014-02-22 (×2): 75 mg via ORAL
  Filled 2014-02-21 (×2): qty 1

## 2014-02-21 MED ORDER — MUPIROCIN 2 % EX OINT
TOPICAL_OINTMENT | CUTANEOUS | Status: AC
  Administered 2014-02-21: 1 via TOPICAL
  Filled 2014-02-21: qty 22

## 2014-02-21 MED ORDER — FUROSEMIDE 20 MG PO TABS: 20.0000 mg | ORAL_TABLET | Freq: Every day | ORAL | Status: DC

## 2014-02-21 MED ORDER — FENTANYL CITRATE 0.05 MG/ML IJ SOLN
INTRAMUSCULAR | Status: AC
  Filled 2014-02-21: qty 2

## 2014-02-21 NOTE — Discharge Summary (Signed)
ELECTROPHYSIOLOGY PROCEDURE DISCHARGE SUMMARY    Patient ID: Jo Lynn,  MRN: [id number], DOB/AGE: Oct 30, 1945 68 y.o.  Admit date: 02/21/2014 Discharge date: 02/21/2014  Primary Care Physician: Chevis Pretty, [place]  Primary Cardiologist: [place] Electrophysiologist: Lovena Le  Primary Discharge Diagnosis:  Ischemic cardiomyopathy status post ICD implantation this admission  Secondary Discharge Diagnosis:  1.  CAD s/p CABG 2004 2.  Hypertension 3.  Diabetes 4.  Hyperlipidemia  No Known Allergies   Procedures This Admission:  1.  Implantation of a STJ single chamber ICD on 02-21-14 by Dr Lovena Le.  The patient received a STJ model number Ellipse ICD.  DFT's were successful at 18 J.  There were no immediate post procedure complications. 2.  CXR on 02-22-2014 demonstrated no pneumothorax status post device implantation.   Brief HPI: Jo Lynn is a 68 y.o. female was referred to electrophysiology in the outpatient setting for consideration of ICD implantation.  Past medical history includes coronary artery disease s/p CABG with ischemic cardiomyopathy.  The patient has persistent LV dysfunction despite guideline directed therapy.  Risks, benefits, and alternatives to ICD implantation were reviewed with the patient who wished to proceed.   Hospital Course:  The patient was admitted and underwent implantation of a STJ single chamber ICD with details as outlined above.   She was monitored on telemetry overnight which demonstrated nsr with PVC's.  Left chest was without hematoma or ecchymosis.  The device was interrogated and found to be functioning normally.  CXR was obtained and demonstrated no pneumothorax status post device implantation. Post op she still had peripheral edema but her cough which was incessant had stopped with a decrease in her dose of metoprolol from 100 bid to 25 bid. Wound care, arm mobility, and restrictions were reviewed with the patient.  Dr Lovena Le  examined the patient and considered them stable for discharge to home.   The patient's discharge medications include an ARB (Losartan) and beta blocker (Metoprolol).   Discharge Vitals: Blood pressure 97/65, pulse 62, temperature 98.5 F (36.9 C), temperature source Oral, resp. rate 15, height 5\' 8"  (1.727 m), weight 192 lb (87.091 kg), SpO2 100 %.   Labs:   Lab Results  Component Value Date   WBC 7.1 02/19/2014   HGB 10.3* 02/19/2014   HCT 32.1* 02/19/2014   MCV 88.4 02/19/2014   PLT 134* 02/19/2014    Recent Labs Lab 02/17/14 0640 02/19/14 1303  NA 139 139  K 3.4* 4.3  CL 114* 112  CO2 17* 18*  BUN 31* 24*  CREATININE 1.69* 1.55*  CALCIUM 8.0* 8.4  PROT 5.2*  --   BILITOT 0.6  --   ALKPHOS 148*  --   ALT 692*  --   AST 258*  --   GLUCOSE 108* 271*     Discharge Medications:    Medication List    ASK your doctor about these medications        aspirin 81 MG EC tablet  Take 1 tablet (81 mg total) by mouth daily.     benzonatate 100 MG capsule  Commonly known as:  TESSALON  Take 1 capsule (100 mg total) by mouth 2 (two) times daily as needed for cough.     clopidogrel 75 MG tablet  Commonly known as:  PLAVIX  Take 1 tablet (75 mg total) by mouth daily.     diclofenac sodium 1 % Gel  Commonly known as:  VOLTAREN  Apply 2 g topically 4 (four) times daily.  furosemide 40 MG tablet  Commonly known as:  LASIX  Take 20 mg by mouth daily.     levofloxacin 500 MG tablet  Commonly known as:  LEVAQUIN  Take 1 tablet (500 mg total) by mouth daily.     losartan 50 MG tablet  Commonly known as:  COZAAR  Take 1 tablet (50 mg total) by mouth daily.     metoprolol succinate 100 MG 24 hr tablet  Commonly known as:  TOPROL-XL  Take 2 tablets (200 mg total) by mouth daily. Take with or immediately following a meal.     nitroGLYCERIN 0.4 MG SL tablet  Commonly known as:  NITROSTAT  Place 1 tablet (0.4 mg total) under the tongue every 5 (five) minutes as needed  for chest pain.     sitaGLIPtin 100 MG tablet  Commonly known as:  JANUVIA  Take 1 tablet (100 mg total) by mouth daily.        Disposition: Will discharge on lasix 60 mg daily and metoprolol 25 mg twice daily.    Duration of Discharge Encounter: Greater than 30 minutes including physician time.  Signed,  Mikle Bosworth.D.

## 2014-02-21 NOTE — Progress Notes (Signed)
Inpatient Diabetes Program Recommendations  AACE/ADA: New Consensus Statement on Inpatient Glycemic Control (2013)  Target Ranges:  Prepandial:   less than 140 mg/dL      Peak postprandial:   less than 180 mg/dL (1-2 hours)      Critically ill patients:  140 - 180 mg/dL   Reason for Visit: Hyperglycemia  Diabetes history: DM2 Outpatient Diabetes medications: Januvia 100 mg QD Current orders for Inpatient glycemic control: tradjenta 5 mg QD Results for AVENELL, SELLERS (MRN [id number]) as of 02/21/2014 15:40  Ref. Range 02/16/2014 10:05  Hgb A1c MFr Bld Latest Range: <5.7 % 8.5 (H)  Results for Jo Lynn, Jo Lynn (MRN [id number]) as of 02/21/2014 15:40  Ref. Range 02/21/2014 06:34 02/21/2014 09:38 02/21/2014 11:42  Glucose-Capillary Latest Range: 70-99 mg/dL 296 (H) 248 (H) 220 (H)     Inpatient Diabetes Program Recommendations Correction (SSI): Add Novolog moderate tidwc HgbA1C: 8.5% - will order OP Diabetes Education consult for uncontrolled DM.  Note: Will follow. Thank you. Lorenda Peck, RD, LDN, CDE Inpatient Diabetes Coordinator [phone]

## 2014-02-21 NOTE — Progress Notes (Signed)
[name], PA notified as to CBG( 295, 248) per post cath order

## 2014-02-21 NOTE — CV Procedure (Signed)
   SURGEON:  Cristopher Peru, MD      PREPROCEDURE DIAGNOSES:   1. Ischemic cardiomyopathy, EF 25%.   2. New York Heart Association class II, heart failure chronically.      POSTPROCEDURE DIAGNOSES:   1. Ischemic cardiomyopathy, EF 25%.   2. New York Heart Association class III heart failure chronically.      PROCEDURES:    1. ICD implantation.  2. Defibrillation threshold testing     INTRODUCTION:  Jo Lynn is a 68 y.o. female with an ischemic CM (EF 25), NYHA Class II CHF, and CAD. At this time, she meets MADIT II/ SCD-HeFT criteria for ICD implantation for primary prevention of sudden death.  The patient has a narrow QRS and does not meet criteria for revascularization.  The patient has been treated with an optimal medical regimen but continues to have a depressed ejection fraction and NYHA Class II CHF symptoms.  The patient therefore  presents today for ICD implantation.      DESCRIPTION OF PROCEDURE:  Informed written consent was obtained and the patient was brought to the electrophysiology lab in the fasting state. The patient was adequately sedated with intravenous Versed, and fentanyl as outlined in the nursing report.  The patient's left chest was prepped and draped in the usual sterile fashion by the EP lab staff.  The skin overlying the left deltopectoral region was infiltrated with lidocaine for local analgesia.  A 5-cm incision was made over the left deltopectoral region.  A left subcutaneous defibrillator pocket was fashioned using a combination of sharp and blunt dissection.  Electrocautery was used to assure hemostasis.      RV Lead Placement: The left axillary vein was cannulated with fluoroscopic visualization.  No contrast was required for this endeavor.  Through the left axillary vein,  a [employer] (serial number [id number]) right ventricular defibrillator lead was advanced with fluoroscopic visualization into the right ventricular apical septal position.  The right  ventricular lead R-wave measured 16 mV with impedance of 661 ohms and a threshold of 0.5 volts at 0.5 milliseconds.   The lead was secured to the pectoralis  fascia using #2 silk suture over the suture sleeves.  The pocket then  irrigated with copious gentamicin solution.  The lead was then  connected to a [place] (serial  Number [id number]) ICD.  The defibrillator was placed into the  pocket.  The pocket was then closed in 2 layers with 2.0 Vicryl suture  for the subcutaneous and subcuticular layers.  Steri-Strips and a  sterile dressing were then applied.   DFT Testing: Defibrillation Threshold testing was then performed. Ventricular fibrillation was induced with a T shock.  Adequate sensing of ventricular fibrillation was observed with minimal dropout with a programmed sensitivity of 1.28mV.  The patient was successfully defibrillated to sinus rhythm with a single 20 joules shock delivered from the device with an impedance of 58 ohms in a duration of 7 seconds.  The patient remained in sinus rhythm thereafter.  There were no early apparent complications.      CONCLUSIONS:   1. Ischemic cardiomyopathy with chronic New York Heart Association class II heart failure.   2. Successful ICD implantation.   3. DFT less than or equal to 20 joules.   4. No early apparent complications.   Cristopher Peru, MD  9:22 AM 02/21/2014

## 2014-02-21 NOTE — Progress Notes (Signed)
Unsuccessful attempt to void with bedpan.

## 2014-02-21 NOTE — H&P (Signed)
HPI Jo Lynn is referred today for consideration for ICD implantation. She is a very pleasant 68 year old woman who sustained a myocardial infarction approximately 6 months ago. She is status post percutaneous intervention. Post procedure, she has persistent left ventricular dysfunction, despite maximal medical therapy. She has an ejection fraction of 25% by echo. She has been on diuretic therapy, losartan, and metoprolol. Her blood pressures have been very well controlled. She also complains of problems with discomfort in her legs when she walks. Her heart failure symptoms are class II. Despite all this, she continues to work full time. She has not had syncope. She has minimal peripheral edema. No chest pain.   Current Outpatient Prescriptions  Medication Sig Dispense Refill  . aspirin EC 81 MG EC tablet Take 1 tablet (81 mg total) by mouth daily.    Marland Kitchen atorvastatin (LIPITOR) 40 MG tablet TAKE 1 TABLET DAILY 90 tablet 1  . clopidogrel (PLAVIX) 75 MG tablet Take 1 tablet (75 mg total) by mouth daily. 90 tablet 3  . diclofenac sodium (VOLTAREN) 1 % GEL Apply 2 g topically 4 (four) times daily. 5 Tube 3  . furosemide (LASIX) 40 MG tablet Take 40 mg by mouth. Take 1/2 daily    . glyBURIDE (DIABETA) 5 MG tablet TAKE 1 TABLET TWICE A DAY 180 tablet 0  . losartan (COZAAR) 50 MG tablet Take 1 tablet (50 mg total) by mouth daily. 30 tablet 6  . metoprolol (LOPRESSOR) 50 MG tablet Take 1 tablet (50 mg total) by mouth 3 (three) times daily. 270 tablet 3  . nitroGLYCERIN (NITROSTAT) 0.4 MG SL tablet Place 1 tablet (0.4 mg total) under the tongue every 5 (five) minutes as needed for chest pain. 90 tablet 3  . sitaGLIPtin (JANUVIA) 100 MG tablet Take 1 tablet (100 mg total) by mouth daily. 60 tablet 0   No current facility-administered medications for this visit.     Past Medical History  Diagnosis Date  . Myocardial infarction 10/2002     - MV CAD --> Referred for CABG (no follow-up post CABG)  . 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 Lucianne Lei Trigt: LIMA-LAD, SVG-RPDA, SVG-OM1-OM2  . ST elevation myocardial infarction (STEMI) of inferolateral wall 09/09/2013    SVG-Om1-2 occluded - PCI in 2 locations; Occluded SVG-RCA & native RCA, LAD & native Cx occcluded. EF ~20%  . Cardiomyopathy, ischemic 09/09/2013    EF ~20-25%; Large MI, existing occluded RCA & SVG-RCA; h/o CABG  . 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 CAD S/P percutaneous coronary angioplasty 09/09/2013    mid SVG-OM1-OM2 (prox LIMB) Rebel BMS 4.0 mm x 16 mm; sequential limb OM1-OM2: Rebel BMS 4.0 mm x 12 mm  . Combined systolic and diastolic congestive heart failure, NYHA class 3 09/09/2013    Echo post Inferolateral STEMI: Severely reduced LVEF ~20-35%; Akinesis of Inferolateral, Inferior & Inferoseptal walls; Grade 1 DDysfxn (initial LVEDP ~26 mmHg with SBP of 99 mHg)  . Essential hypertension 08/17/2012  . Hyperlipidemia with target LDL less than 70 08/17/2012  . Diabetes mellitus type 2 with complications 9/32/3557  ROS:  All systems reviewed and negative except as noted in the HPI.   Past Surgical History  Procedure Laterality Date  . Coronary artery bypass graft       Family History  Problem Relation Age of Onset  . Diabetes Mother   . Diabetes Father      History   Social History  . Marital Status: Single    Spouse Name: N/A    Number of Children: N/A  . Years of Education: N/A    Occupational History  . Not on file.   Social History Main Topics  . Smoking status: Former Research scientist (life sciences)  . Smokeless tobacco: Not on file  . Alcohol Use: No  . Drug Use: No  . Sexual Activity: Not on file   Other Topics Concern  . Not on file   Social History Narrative     BP 117/69 mmHg  Pulse 67  Ht 5\' 8"  (1.727 m)  Wt 198 lb 3.2 oz (89.903 kg)  BMI 30.14 kg/m2  Physical Exam:  Stable appearing 68 year old woman, NAD HEENT: Unremarkable Neck: 7 cm JVD, no thyromegally Lymphatics: No adenopathy Back: No CVA tenderness Lungs: Clear with no wheezes, rales, or rhonchi.  HEART: Regular rate rhythm, no murmurs, no rubs, no clicks Abd: soft, positive bowel sounds, no organomegally, no rebound, no guarding Ext: 2 plus pulses, no edema, no cyanosis, no clubbing Skin: No rashes no nodules Neuro: CN II through XII intact, motor grossly intact  EKG - normal sinus rhythm with anterior myocardial infarction   Chest x-ray - reviewed  Assess/Plan:            Acute on chronic combined systolic and diastolic CHF (congestive heart failure) - in setting of recent Large Inferolateral STEMI with existing RCA/SVG-RCA occlusion - Evans Lance, MD at 01/19/2014 5:26 PM     Status: Written Related Problem: Acute on chronic combined systolic and diastolic CHF (congestive heart failure) - in setting of recent Large Inferolateral STEMI with existing RCA/SVG-RCA occlusion   Expand All Collapse All   Her chronic systolic heart failure is maximally treated. She has class II symptoms. She will continue her current medications. I discussed the treatment options with the patient. She has an indication for ICD implantation for primary prevention of malignant cardiac arrhythmias. This to be scheduled in the earliest possible convenient time.            Essential hypertension - Evans Lance, MD at 01/19/2014 5:27 PM     Status: Written  Related Problem: Essential hypertension   Expand All Collapse All   Her blood pressure is well controlled. She will continue her current medical therapy, and maintain a low-sodium diet.            Claudication - Evans Lance, MD at 01/19/2014 5:29 PM     Status: Written Related Problem: Claudication   Expand All Collapse All   The patient has pain in her legs when she walks. This apparently is a new problem. She is on antiplatelet therapy with aspirin and Plavix. This could be related to a low cardiac output state, but I suspect it has more to do with peripheral vascular disease. We will plan to work this up after she has undergone ICD implantation. She has no evidence of limb threatening ischemia at the present time.            Hyperlipidemia with target LDL less than 70 - Evans Lance, MD at 01/19/2014 5:29 PM  Status: Written Related Problem: Hyperlipidemia with target LDL less than 70   Expand All Collapse All   She will continue her statin therapy and maintain a low-fat diet.       Since our prior clinic visit, her cough has worsened. She thinks that the worsening cough occurred after uptitration of her beta blocker. In the short term to see if the beta blocker may be contributing, we will reduce the dose of her medication after her procedure. If that does not work, will plan to hold her ARB though time wise, it appears that uptitration of the beta blocker was the cause of her cough.  Mikle Bosworth.D.

## 2014-02-21 NOTE — H&P (Signed)
  ICD Criteria  Current LVEF:25% ;Obtained > or = 1 month ago and < or = 3 months ago.  NYHA Functional Classification: Class II  Heart Failure History:  Yes, Duration of heart failure since onset is > 9 months  Non-Ischemic Dilated Cardiomyopathy History:  No.  Atrial Fibrillation/Atrial Flutter:  No.  Ventricular Tachycardia History:  No.  Cardiac Arrest History:  No  History of Syndromes with Risk of Sudden Death:  No.  Previous ICD:  No.  Electrophysiology Study: No.  Prior MI: Yes, Most recent MI timeframe is > 40 days.  PPM: No.  OSA:  No  Patient Life Expectancy of >=1 year: Yes.  Anticoagulation Therapy:  Patient is NOT on anticoagulation therapy.   Beta Blocker Therapy:  Yes.   Ace Inhibitor/ARB Therapy:  Yes.

## 2014-02-22 ENCOUNTER — Other Ambulatory Visit: Payer: Self-pay | Admitting: *Deleted

## 2014-02-22 ENCOUNTER — Ambulatory Visit (HOSPITAL_COMMUNITY): Payer: BC Managed Care – PPO

## 2014-02-22 ENCOUNTER — Encounter: Payer: Self-pay | Admitting: *Deleted

## 2014-02-22 DIAGNOSIS — I5043 Acute on chronic combined systolic (congestive) and diastolic (congestive) heart failure: Secondary | ICD-10-CM | POA: Diagnosis not present

## 2014-02-22 DIAGNOSIS — I252 Old myocardial infarction: Secondary | ICD-10-CM | POA: Diagnosis not present

## 2014-02-22 DIAGNOSIS — I255 Ischemic cardiomyopathy: Secondary | ICD-10-CM | POA: Diagnosis not present

## 2014-02-22 DIAGNOSIS — I2510 Atherosclerotic heart disease of native coronary artery without angina pectoris: Secondary | ICD-10-CM | POA: Diagnosis not present

## 2014-02-22 DIAGNOSIS — I5042 Chronic combined systolic (congestive) and diastolic (congestive) heart failure: Secondary | ICD-10-CM

## 2014-02-22 DIAGNOSIS — Z95810 Presence of automatic (implantable) cardiac defibrillator: Secondary | ICD-10-CM

## 2014-02-22 LAB — CULTURE, BLOOD (ROUTINE X 2)
CULTURE: NO GROWTH
Culture: NO GROWTH

## 2014-02-22 LAB — GLUCOSE, CAPILLARY: Glucose-Capillary: 170 mg/dL — ABNORMAL HIGH (ref 70–99)

## 2014-02-22 MED ORDER — POTASSIUM CHLORIDE CRYS ER 20 MEQ PO TBCR
20.0000 meq | EXTENDED_RELEASE_TABLET | Freq: Once | ORAL | Status: AC
  Administered 2014-02-22: 20 meq via ORAL
  Filled 2014-02-22: qty 1

## 2014-02-22 MED ORDER — METOPROLOL SUCCINATE ER 25 MG PO TB24: 25.0000 mg | ORAL_TABLET | Freq: Two times a day (BID) | ORAL | Status: DC

## 2014-02-22 MED ORDER — FUROSEMIDE 10 MG/ML IJ SOLN
60.0000 mg | Freq: Once | INTRAMUSCULAR | Status: AC
  Administered 2014-02-22: 60 mg via INTRAVENOUS
  Filled 2014-02-22: qty 6

## 2014-02-22 MED ORDER — FUROSEMIDE 40 MG PO TABS: 60.0000 mg | ORAL_TABLET | Freq: Every day | ORAL | Status: DC

## 2014-02-22 NOTE — Discharge Instructions (Signed)
Supplemental Discharge Instructions for  Pacemaker/Defibrillator Patients  Activity No heavy lifting or vigorous activity with your left/right arm for 6 to 8 weeks.  Do not raise your left/right arm above your head for one week.  Gradually raise your affected arm as drawn below.                       01/01                   01/02                    01/03                    01/04            NO DRIVING for  1 week    ; you may begin driving on     13/24     . WOUND CARE - Keep the wound area clean and dry.  Do not get this area wet for one week. No showers for one week; you may shower on      01/05        . - The tape/steri-strips on your wound will fall off; do not pull them off.  No bandage is needed on the site.  DO  NOT apply any creams, oils, or ointments to the wound area. - If you notice any drainage or discharge from the wound, any swelling or bruising at the site, or you develop a fever > 101? F after you are discharged home, call the office at once.  Special Instructions - You are still able to use cellular telephones; use the ear opposite the side where you have your pacemaker/defibrillator.  Avoid carrying your cellular phone near your device. - When traveling through airports, show security personnel your identification card to avoid being screened in the metal detectors.  Ask the security personnel to use the hand wand. - Avoid arc welding equipment, MRI testing (magnetic resonance imaging), TENS units (transcutaneous nerve stimulators).  Call the office for questions about other devices. - Avoid electrical appliances that are in poor condition or are not properly grounded. - Microwave ovens are safe to be near or to operate.  Additional information for defibrillator patients should your device go off: - If your device goes off ONCE and you feel fine afterward, notify the device clinic nurses. - If your device goes off ONCE and you do not feel well afterward, call 911. - If  your device goes off TWICE, call 911. - If your device goes off THREE times in one day, call 911.  DO NOT DRIVE YOURSELF OR A FAMILY MEMBER WITH A DEFIBRILLATOR TO THE HOSPITAL--CALL 911.    Cardioverter Defibrillator Implantation An implantable cardioverter defibrillator (ICD) is a small, lightweight, battery-powered device that is placed (implanted) under the skin in the chest or abdomen. Your caregiver may prescribe an ICD if:  You have had an irregular heart rhythm (arrhythmia) that originated in the lower chambers of the heart (ventricles).  Your heart has been damaged by a disease (such as coronary artery disease) or heart condition (such as a heart attack). An ICD consists of a battery that lasts several years, a small computer called a pulse generator, and wires called leads that go into the heart. It is used to detect and correct two dangerous arrhythmias: a rapid heart rhythm (tachycardia) and an arrhythmia in which the ventricles contract in  an uncoordinated way (fibrillation). When an ICD detects tachycardia, it sends an electrical signal to the heart that restores the heartbeat to normal (cardioversion). This signal is usually painless. If cardioversion does not work or if the ICD detects fibrillation, it delivers a small electrical shock to the heart (defibrillation) to restart the heart. The shock may feel like a strong jolt in the chest.ICDs may be programmed to correct other problems. Sometimes, ICDs are programmed to act as another type of implantable device called a pacemaker. Pacemakers are used to treat a slow heartbeat (bradycardia). LET YOUR CAREGIVER KNOW ABOUT:  Any allergies you have.  All medicines you are taking, including vitamins, herbs, eyedrops, and over-the-counter medicines and creams.  Previous problems you or members of your family have had with the use of anesthetics.  Any blood disorders you have had.  Other health problems you have. RISKS AND  COMPLICATIONS Generally, the procedure to implant an ICD is safe. However, as with any surgical procedure, complications can occur. Possible complications associated with implanting an ICD include:  Swelling, bleeding, or bruising at the site where the ICD was implanted.  Infection at the site where the ICD was implanted.  A reaction to medicine used during the procedure.  Nerve, heart, or blood vessel damage.  Blood clots. BEFORE THE PROCEDURE  You may need to have blood tests, heart tests, or a chest X-ray done before the day of the procedure.  Ask your caregiver about changing or stopping your regular medicines.  Make plans to have someone drive you home. You may need to stay in the hospital overnight after the procedure.  Stop smoking at least 24 hours before the procedure.  Take a bath or shower the night before the procedure. You may need to scrub your chest or abdomen with a special type of soap.  Do not eat or drink before your procedure for as long as directed by your caregiver. Ask if it is okay to take any needed medicine with a small sip of water. PROCEDURE  The procedure to implant an ICD in your chest or abdomen is usually done at a hospital in a room that has a large X-ray machine called a fluoroscope. The machine will be above you during the procedure. It will help your caregiver see your heart during the procedure. Implanting an ICD usually takes 1-3 hours. Before the procedure:   Small monitors will be put on your body. They will be used to check your heart, blood pressure, and oxygen level.  A needle will be put into a vein in your hand or arm. This is called an intravenous (IV) access tube. Fluids and medicine will flow directly into your body through the IV tube.  Your chest or abdomen will be cleaned with a germ-killing (antiseptic) solution. The area may be shaved.  You may be given medicine to help you relax (sedative).  You will be given a medicine called  a local anesthetic. This medicine will make the surgical site numb while the ICD is implanted. You will be sleepy but awake during the procedure. After you are numb the procedure will begin. The caregiver will:  Make a small cut (incision). This will make a pocket deep under your skin that will hold the pulse generator.  Guide the leads through a large blood vessel into your heart and attach them to the heart muscles. Depending on the ICD, the leads may go into one ventricle or they may go to both ventricles and into  an upper chamber of the heart (atrium).  Test the ICD.  Close the incision with stitches, glue, or staples. AFTER THE PROCEDURE  You may feel pain. Some pain is normal. It may last a few days.  You may stay in a recovery area until the local anesthetic has worn off. Your blood pressure and pulse will be checked often. You will be taken to a room where your heart will be monitored.  A chest X-ray will be taken. This is done to check that the cardioverter defibrillator is in the right place.  You may stay in the hospital overnight.  A slight bump may be seen over the skin where the ICD was placed. Sometimes, it is possible to feel the ICD under the skin. This is normal.  In the months and years afterward, your caregiver will check the device, the leads, and the battery every few months. Eventually, when the battery is low, the ICD will be replaced. Document Released: 11/03/2001 Document Revised: 12/02/2012 Document Reviewed: 03/02/2012 [facility] Patient Information 2015 [place], [place]. This information is not intended to replace advice given to you by your health care provider. Make sure you discuss any questions you have with your health care provider.

## 2014-02-22 NOTE — Progress Notes (Signed)
Reviewed discharge instructions with patient and daughter and they stated their understanding.  Discharged home with daughter via wheelchair. Work note given to patient along with ICD registration card.   Filed Vitals:   02/22/14 0900  BP: 108/68  Pulse:   Temp:   Resp:    Sanda Linger

## 2014-03-03 ENCOUNTER — Ambulatory Visit (INDEPENDENT_AMBULATORY_CARE_PROVIDER_SITE_OTHER): Payer: BLUE CROSS/BLUE SHIELD | Admitting: *Deleted

## 2014-03-03 ENCOUNTER — Other Ambulatory Visit (INDEPENDENT_AMBULATORY_CARE_PROVIDER_SITE_OTHER): Payer: BC Managed Care – PPO | Admitting: *Deleted

## 2014-03-03 DIAGNOSIS — I255 Ischemic cardiomyopathy: Secondary | ICD-10-CM

## 2014-03-03 DIAGNOSIS — I5042 Chronic combined systolic (congestive) and diastolic (congestive) heart failure: Secondary | ICD-10-CM

## 2014-03-03 LAB — MDC_IDC_ENUM_SESS_TYPE_INCLINIC
HighPow Impedance: 50.625
HighPow Impedance: 51 Ohm
Lead Channel Impedance Value: 525 Ohm
Lead Channel Pacing Threshold Amplitude: 0.5 V
Lead Channel Pacing Threshold Pulse Width: 0.5 ms
Lead Channel Pacing Threshold Pulse Width: 0.5 ms
Lead Channel Setting Pacing Amplitude: 3.5 V
Lead Channel Setting Pacing Pulse Width: 0.5 ms
MDC IDC MSMT BATTERY REMAINING LONGEVITY: 103.2 mo
MDC IDC MSMT LEADCHNL RV PACING THRESHOLD AMPLITUDE: 0.5 V
MDC IDC MSMT LEADCHNL RV SENSING INTR AMPL: 11.6 mV
MDC IDC PG SERIAL: 7233600
MDC IDC SESS DTM: 20160107135504
MDC IDC SET LEADCHNL RV SENSING SENSITIVITY: 0.5 mV
MDC IDC STAT BRADY RV PERCENT PACED: 0 %
Zone Setting Detection Interval: 270 ms
Zone Setting Detection Interval: 300 ms
Zone Setting Detection Interval: 350 ms

## 2014-03-03 LAB — BASIC METABOLIC PANEL
BUN: 35 mg/dL — ABNORMAL HIGH (ref 6–23)
CALCIUM: 8.5 mg/dL (ref 8.4–10.5)
CHLORIDE: 107 meq/L (ref 96–112)
CO2: 28 meq/L (ref 19–32)
Creatinine, Ser: 1.9 mg/dL — ABNORMAL HIGH (ref 0.4–1.2)
GFR: 33.42 mL/min — ABNORMAL LOW (ref >60.00)
GLUCOSE: 124 mg/dL — AB (ref 70–99)
Potassium: 3.5 mEq/L (ref 3.5–5.1)
Sodium: 142 mEq/L (ref 135–145)

## 2014-03-03 NOTE — Progress Notes (Signed)

## 2014-03-04 ENCOUNTER — Other Ambulatory Visit: Payer: Self-pay | Admitting: Nurse Practitioner

## 2014-03-04 DIAGNOSIS — E119 Type 2 diabetes mellitus without complications: Secondary | ICD-10-CM

## 2014-03-04 MED ORDER — SITAGLIPTIN PHOSPHATE 100 MG PO TABS: 100.0000 mg | ORAL_TABLET | Freq: Every day | ORAL | Status: DC

## 2014-03-04 NOTE — Telephone Encounter (Signed)
done

## 2014-03-11 ENCOUNTER — Ambulatory Visit (INDEPENDENT_AMBULATORY_CARE_PROVIDER_SITE_OTHER): Payer: BLUE CROSS/BLUE SHIELD | Admitting: Cardiology

## 2014-03-11 ENCOUNTER — Ambulatory Visit (HOSPITAL_COMMUNITY)
Admission: RE | Admit: 2014-03-11 | Discharge: 2014-03-11 | Disposition: A | Discharge status: Home / Self Care | Payer: BLUE CROSS/BLUE SHIELD | Source: Ambulatory Visit | Attending: Cardiovascular Disease | Admitting: Cardiovascular Disease

## 2014-03-11 ENCOUNTER — Encounter: Payer: Self-pay | Admitting: Cardiology

## 2014-03-11 VITALS — BP 110/60 | HR 72 | Ht 68.0 in | Wt 193.0 lb

## 2014-03-11 DIAGNOSIS — I255 Ischemic cardiomyopathy: Secondary | ICD-10-CM | POA: Diagnosis not present

## 2014-03-11 DIAGNOSIS — I10 Essential (primary) hypertension: Secondary | ICD-10-CM | POA: Diagnosis not present

## 2014-03-11 DIAGNOSIS — I70219 Atherosclerosis of native arteries of extremities with intermittent claudication, unspecified extremity: Secondary | ICD-10-CM | POA: Diagnosis present

## 2014-03-11 DIAGNOSIS — I25710 Atherosclerosis of autologous vein coronary artery bypass graft(s) with unstable angina pectoris: Secondary | ICD-10-CM

## 2014-03-11 DIAGNOSIS — R600 Localized edema: Secondary | ICD-10-CM | POA: Diagnosis not present

## 2014-03-11 DIAGNOSIS — E878 Other disorders of electrolyte and fluid balance, not elsewhere classified: Secondary | ICD-10-CM

## 2014-03-11 NOTE — Progress Notes (Signed)
HPI The patient presents for follow up after an acute inferior myocardial infarction with 2 bare-metal stents to a vein graft to obtuse marginal 1 and obtuse marginal 2 in July. . She finally agreed to have an ICD and she had this procedure done last month. A few weeks prior to that she was actually in the hospital with pneumonia. I reviewed these hospital records. She's continued to have some mild lower extremity swelling but her weights are actually down. This seems to be improved. She's had some leg pain and I sent her for ABIs with the preliminary results as below. She does not have critical limb ischemia. She actually thinks it's more the bone in her ankle which she reports is "broken". However, she's never had a diagnosis of a broken ankle. She says her cough is somewhat improved. Of note this improved in the hospital after her beta blocker dose was reduced and when I reviewed the records seem to indicate that that is why they reduce the beta blocker dose. She's not having a new PND or orthopnea. She has some mild right greater than left lower extremity swelling. Her weight have been stable. She's not adding salt in her diet. She has baseline class II heart failure symptoms. She's not having any chest pressure, neck or arm discomfort. Had no palpitations, presyncope or syncope.   No Known Allergies  Current Outpatient Prescriptions  Medication Sig Dispense Refill  . aspirin EC 81 MG EC tablet Take 1 tablet (81 mg total) by mouth daily.    . benzonatate (TESSALON) 100 MG capsule Take 1 capsule (100 mg total) by mouth 2 (two) times daily as needed for cough. 20 capsule 0  . clopidogrel (PLAVIX) 75 MG tablet Take 1 tablet (75 mg total) by mouth daily. 90 tablet 3  . diclofenac sodium (VOLTAREN) 1 % GEL Apply 2 g topically 4 (four) times daily. 5 Tube 3  . furosemide (LASIX) 40 MG tablet Take 1.5 tablets (60 mg total) by mouth daily. 50 tablet 11  . losartan (COZAAR) 50 MG tablet Take 1 tablet  (50 mg total) by mouth daily. 30 tablet 6  . metoprolol succinate (TOPROL-XL) 25 MG 24 hr tablet Take 1 tablet (25 mg total) by mouth 2 (two) times daily. Take with or immediately following a meal. 60 tablet 11  . nitroGLYCERIN (NITROSTAT) 0.4 MG SL tablet Place 1 tablet (0.4 mg total) under the tongue every 5 (five) minutes as needed for chest pain. 90 tablet 3  . sitaGLIPtin (JANUVIA) 100 MG tablet Take 1 tablet (100 mg total) by mouth daily. 90 tablet 0   No current facility-administered medications for this visit.    Past Medical History  Diagnosis Date  . Myocardial infarction 10/2002    - MV CAD --> Referred for CABG (no follow-up post CABG)  . 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 Lucianne Lei Trigt: LIMA-LAD, SVG-RPDA, SVG-OM1-OM2  . ST elevation myocardial infarction (STEMI) of inferolateral wall 09/09/2013    SVG-Om1-2 occluded - PCI in 2 locations; Occluded SVG-RCA & native RCA, LAD & native Cx occcluded.  EF ~20%  . Cardiomyopathy, ischemic 09/09/2013    EF ~20-25%; Large MI, existing occluded RCA & SVG-RCA; h/o CABG  . 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 CAD S/P percutaneous coronary angioplasty 09/09/2013    mid SVG-OM1-OM2 (prox LIMB) Rebel BMS 4.0 mm x 16 mm; sequential limb OM1-OM2: Rebel BMS 4.0 mm x 12 mm  . Combined systolic and diastolic congestive heart failure, NYHA class 3 09/09/2013    Echo post Inferolateral STEMI: Severely reduced LVEF ~20-35%; Akinesis of Inferolateral, Inferior & Inferoseptal walls; Grade 1 DDysfxn (initial LVEDP ~26 mmHg with SBP of 99 mHg)  . Essential hypertension 08/17/2012  . Hyperlipidemia  with target LDL less than 70 08/17/2012  . Diabetes mellitus type 2 with complications 4/33/2951  . AICD (automatic cardioverter/defibrillator) present 02/21/2014    single chamber  by dr taylor    Past Surgical History  Procedure Laterality Date  . Coronary artery bypass graft    . Left heart catheterization with coronary angiogram N/A 09/09/2013    Procedure: LEFT HEART CATHETERIZATION WITH CORONARY ANGIOGRAM;  Surgeon: Leonie Man, MD;  Location: [facility] C[facility] LAB;  Service: Cardiovascular;  Laterality: N/A;  . Implantable cardioverter defibrillator implant  02-21-2014    STJ single chamber ICD implanted by Dr Lovena Le for primary prevention  . Implantable cardioverter defibrillator implant N/A 02/21/2014    Procedure: IMPLANTABLE CARDIOVERTER DEFIBRILLATOR IMPLANT;  Surgeon: Evans Lance, MD;  Location: [facility] CATH LAB;  Service: Cardiovascular;  Laterality: N/A;    ROS:  As stated in the HPI and negative for all other systems.   PHYSICAL EXAM BP 110/60 mmHg  Pulse 72  Ht 5\' 8"  (1.727 m)  Wt 193 lb (87.544 kg)  BMI 29.35 kg/m2 GENERAL:  Well appearing HEENT:  Pupils equal round and reactive, fundi not visualized, oral mucosa unremarkable NECK:  No jugular venous distention, waveform within normal limits, carotid upstroke brisk and symmetric, no bruits, no thyromegaly LUNGS:  Clear to auscultation bilaterally CHEST:  ICD scar intact HEART:  PMI not displaced or sustained,S1 and S2 within normal limits, no S3, no S4, no clicks, no rubs, no murmurs ABD:  Flat, positive bowel sounds normal in frequency in pitch, no bruits, no rebound, no guarding, no midline pulsatile mass, no hepatomegaly, no splenomegaly EXT:  2 plus pulses throughout, mild ankle edema, no cyanosis no clubbing SKIN:  No rashes no nodules NEURO:  Cranial nerves II - XII intact  ASSESSMENT AND PLAN  CAD:   She is having no new cardiovascular symptoms.  No further work up is planned.    CARDIOMYOPATHY:   She is now  status post ICD. Of note her beta blocker dose was reduced in the hospital. I tried to review records to understand this and there was a mention of cough. At this point given the fact that she is doing relatively well I will not titrate her beta blocker further. She will remain on the meds as listed.  CKD:   She was sent home on a higher dose of Lasix. I did review her most recent creatinine was up slightly. She will have a basic metabolic profile in 10 days.  MR:  Moderate.  We will follow this clinically.   It was moderate on the echo last last year.  Hospital records reviewed extensively including 2 recent hospitalization.  LEG PAIN:  Preliminary ABIs demonstrate moderately severe reduction in flow but not critical.  For now I will try to increase her walking. She  can get her ankle evaluated further by Chevis Pretty, FNP.  The pain actually does not sound significantly like claudication.

## 2014-03-11 NOTE — Patient Instructions (Signed)
Your physician recommends that you schedule a follow-up appointment in: 2 MONTHS with Dr. Percival Spanish  We want you to have labs done in 10 days at Dr [facility] office

## 2014-03-11 NOTE — Progress Notes (Signed)
ABI Completed. [place]

## 2014-03-16 ENCOUNTER — Telehealth: Payer: Self-pay | Admitting: Nurse Practitioner

## 2014-03-16 ENCOUNTER — Telehealth: Payer: Self-pay | Admitting: Cardiology

## 2014-03-16 DIAGNOSIS — E119 Type 2 diabetes mellitus without complications: Secondary | ICD-10-CM

## 2014-03-16 MED ORDER — SITAGLIPTIN PHOSPHATE 100 MG PO TABS: 100.0000 mg | ORAL_TABLET | Freq: Every day | ORAL | Status: DC

## 2014-03-16 NOTE — Telephone Encounter (Signed)
Pt given appt with MMM 03/25/14 to follow up from hospitalization for pneumonia and she also is c/o ankle hurting, she needed rx for her Januvia sent to the pharmacy, our system shows it was sent on 03/04/14 but the pharmacy doesn't have it, rx re-sent.

## 2014-03-16 NOTE — Telephone Encounter (Signed)
1.20.16  Patient brought in Attending Provider Statement [facility]) for Dr Percival Spanish to complete and sign.  Sent full package to [facility] [place] to process.

## 2014-03-23 ENCOUNTER — Encounter: Payer: Self-pay | Admitting: Internal Medicine

## 2014-03-25 ENCOUNTER — Ambulatory Visit (INDEPENDENT_AMBULATORY_CARE_PROVIDER_SITE_OTHER): Payer: Medicare Other

## 2014-03-25 ENCOUNTER — Other Ambulatory Visit: Payer: Self-pay | Admitting: *Deleted

## 2014-03-25 ENCOUNTER — Encounter: Payer: Self-pay | Admitting: Nurse Practitioner

## 2014-03-25 ENCOUNTER — Ambulatory Visit (INDEPENDENT_AMBULATORY_CARE_PROVIDER_SITE_OTHER): Payer: Medicare Other | Admitting: Nurse Practitioner

## 2014-03-25 VITALS — BP 111/74 | HR 69 | Temp 96.8°F | Ht 68.0 in | Wt 186.0 lb

## 2014-03-25 DIAGNOSIS — M25571 Pain in right ankle and joints of right foot: Secondary | ICD-10-CM

## 2014-03-25 DIAGNOSIS — I5043 Acute on chronic combined systolic (congestive) and diastolic (congestive) heart failure: Secondary | ICD-10-CM

## 2014-03-25 DIAGNOSIS — I255 Ischemic cardiomyopathy: Secondary | ICD-10-CM

## 2014-03-25 DIAGNOSIS — Z09 Encounter for follow-up examination after completed treatment for conditions other than malignant neoplasm: Secondary | ICD-10-CM

## 2014-03-25 DIAGNOSIS — J189 Pneumonia, unspecified organism: Secondary | ICD-10-CM

## 2014-03-25 DIAGNOSIS — E878 Other disorders of electrolyte and fluid balance, not elsewhere classified: Secondary | ICD-10-CM

## 2014-03-25 NOTE — Telephone Encounter (Signed)
Bmp faxed over to [facility]

## 2014-03-25 NOTE — Addendum Note (Signed)
Addended by: Chevis Pretty on: 03/25/2014 12:35 PM   Modules accepted: Orders

## 2014-03-25 NOTE — Telephone Encounter (Signed)
New Message         Tesh at [facility] calling stating pt doesn't have all of her lab orders with her that Dr. Percival Spanish ordered and is wanting the orders to be faxed to their office. Fax [phone]. Please call if you have any questions.

## 2014-03-25 NOTE — Progress Notes (Addendum)
   Subjective:    Patient ID: Jo Lynn, female    DOB: Dec 07, 1945, 69 y.o.   MRN: [id number]  HPI Patient here today for hospital follow up- [facility] was in hospital with pneumonia and heart failure- SHe is doing better today. Still has very little energy- No change to medications. Hospital records reviewed.  * SHe had a defibrillator put in while in hospital-  * right ankle pain- no injury  Review of Systems  Constitutional: Negative.   HENT: Negative.   Respiratory: Negative.   Cardiovascular: Negative.   Gastrointestinal: Negative.   Neurological: Negative.   Psychiatric/Behavioral: Negative.   All other systems reviewed and are negative.      Objective:   Physical Exam  Constitutional: She is oriented to person, place, and time. She appears well-developed.  Cardiovascular: Normal rate, regular rhythm and normal heart sounds.   Pulmonary/Chest: Effort normal and breath sounds normal.  Neurological: She is alert and oriented to person, place, and time.  Skin: Skin is warm and dry.  Psychiatric: She has a normal mood and affect. Her behavior is normal. Judgment and thought content normal.   BP 111/74 mmHg  Pulse 69  Temp(Src) 96.8 F (36 C) (Oral)  Ht 5\' 8"  (1.727 m)  Wt 186 lb (84.369 kg)  BMI 28.29 kg/m2        Assessment & Plan:   1. Hospital discharge follow-up   2. Acute on chronic combined systolic and diastolic CHF (congestive heart failure) - in setting of recent Large Inferolateral STEMI with existing RCA/SVG-RCA occlusion   3. CAP (community acquired pneumonia)    Continue all meds Need to repeat chest x ray in 4 weeks Limit fluid intake RTO in 1 month  [place], FNP

## 2014-03-29 ENCOUNTER — Telehealth: Payer: Self-pay | Admitting: Cardiology

## 2014-03-29 NOTE — Telephone Encounter (Signed)
Received [facility] Attending Provider Statement signed and completed by Dr Percival Spanish.  Faxed Attending Provider Statement to [facility] and notified patient that form had been signed and faxed to [facility]

## 2014-03-31 ENCOUNTER — Other Ambulatory Visit (INDEPENDENT_AMBULATORY_CARE_PROVIDER_SITE_OTHER): Payer: Medicare Other

## 2014-03-31 DIAGNOSIS — E878 Other disorders of electrolyte and fluid balance, not elsewhere classified: Secondary | ICD-10-CM

## 2014-03-31 NOTE — Progress Notes (Signed)
Labs for Dr Ricarda Frame e-req

## 2014-04-01 LAB — BASIC METABOLIC PANEL
BUN: 55 mg/dL — ABNORMAL HIGH (ref 6–23)
CHLORIDE: 105 meq/L (ref 96–112)
CO2: 23 meq/L (ref 19–32)
Calcium: 9.2 mg/dL (ref 8.4–10.5)
Creat: 1.99 mg/dL — ABNORMAL HIGH (ref 0.50–1.10)
GLUCOSE: 362 mg/dL — AB (ref 70–99)
POTASSIUM: 5.6 meq/L — AB (ref 3.5–5.3)
SODIUM: 140 meq/L (ref 135–145)

## 2014-04-05 ENCOUNTER — Telehealth: Payer: Self-pay | Admitting: Cardiology

## 2014-04-05 NOTE — Telephone Encounter (Signed)
Pt is still having problems with her ankle. She needs something for pain please.

## 2014-04-05 NOTE — Telephone Encounter (Signed)
She needs to go to her primary MD for ankle pain.

## 2014-04-05 NOTE — Telephone Encounter (Signed)
Returned call to patient spoke to daughter.She stated mother continues to have pain in right ankle when she walks.Stated Dr.Hochrein said he would give her something for pain.Message sent to [place].

## 2014-04-06 NOTE — Telephone Encounter (Signed)
Pt. Informed of  Dr. Percival Spanish instructions

## 2014-04-08 ENCOUNTER — Other Ambulatory Visit: Payer: Self-pay | Admitting: Nurse Practitioner

## 2014-04-08 DIAGNOSIS — E118 Type 2 diabetes mellitus with unspecified complications: Secondary | ICD-10-CM

## 2014-04-13 ENCOUNTER — Other Ambulatory Visit: Payer: Self-pay | Admitting: *Deleted

## 2014-04-13 DIAGNOSIS — E878 Other disorders of electrolyte and fluid balance, not elsewhere classified: Secondary | ICD-10-CM

## 2014-04-18 ENCOUNTER — Inpatient Hospital Stay (HOSPITAL_COMMUNITY)
Admission: EM | Admit: 2014-04-18 | Discharge: 2014-04-22 | DRG: 292 | Disposition: A | Discharge status: Home / Self Care | Payer: BLUE CROSS/BLUE SHIELD | Attending: Internal Medicine | Admitting: Internal Medicine

## 2014-04-18 ENCOUNTER — Emergency Department (HOSPITAL_COMMUNITY): Payer: BLUE CROSS/BLUE SHIELD

## 2014-04-18 ENCOUNTER — Encounter (HOSPITAL_COMMUNITY): Payer: Self-pay | Admitting: Family Medicine

## 2014-04-18 ENCOUNTER — Other Ambulatory Visit (HOSPITAL_COMMUNITY): Payer: Self-pay

## 2014-04-18 DIAGNOSIS — Z7902 Long term (current) use of antithrombotics/antiplatelets: Secondary | ICD-10-CM

## 2014-04-18 DIAGNOSIS — Z87891 Personal history of nicotine dependence: Secondary | ICD-10-CM

## 2014-04-18 DIAGNOSIS — D696 Thrombocytopenia, unspecified: Secondary | ICD-10-CM | POA: Diagnosis present

## 2014-04-18 DIAGNOSIS — R112 Nausea with vomiting, unspecified: Secondary | ICD-10-CM | POA: Diagnosis present

## 2014-04-18 DIAGNOSIS — I129 Hypertensive chronic kidney disease with stage 1 through stage 4 chronic kidney disease, or unspecified chronic kidney disease: Secondary | ICD-10-CM | POA: Diagnosis present

## 2014-04-18 DIAGNOSIS — I255 Ischemic cardiomyopathy: Secondary | ICD-10-CM | POA: Diagnosis present

## 2014-04-18 DIAGNOSIS — N179 Acute kidney failure, unspecified: Secondary | ICD-10-CM | POA: Diagnosis not present

## 2014-04-18 DIAGNOSIS — K761 Chronic passive congestion of liver: Secondary | ICD-10-CM | POA: Diagnosis present

## 2014-04-18 DIAGNOSIS — Z95810 Presence of automatic (implantable) cardiac defibrillator: Secondary | ICD-10-CM | POA: Diagnosis present

## 2014-04-18 DIAGNOSIS — E785 Hyperlipidemia, unspecified: Secondary | ICD-10-CM | POA: Diagnosis present

## 2014-04-18 DIAGNOSIS — M25571 Pain in right ankle and joints of right foot: Secondary | ICD-10-CM | POA: Diagnosis present

## 2014-04-18 DIAGNOSIS — IMO0002 Reserved for concepts with insufficient information to code with codable children: Secondary | ICD-10-CM | POA: Diagnosis present

## 2014-04-18 DIAGNOSIS — R109 Unspecified abdominal pain: Secondary | ICD-10-CM

## 2014-04-18 DIAGNOSIS — N183 Chronic kidney disease, stage 3 (moderate): Secondary | ICD-10-CM | POA: Diagnosis present

## 2014-04-18 DIAGNOSIS — K8020 Calculus of gallbladder without cholecystitis without obstruction: Secondary | ICD-10-CM | POA: Diagnosis present

## 2014-04-18 DIAGNOSIS — N184 Chronic kidney disease, stage 4 (severe): Secondary | ICD-10-CM | POA: Diagnosis present

## 2014-04-18 DIAGNOSIS — I5023 Acute on chronic systolic (congestive) heart failure: Secondary | ICD-10-CM | POA: Diagnosis present

## 2014-04-18 DIAGNOSIS — Z7982 Long term (current) use of aspirin: Secondary | ICD-10-CM | POA: Diagnosis not present

## 2014-04-18 DIAGNOSIS — M109 Gout, unspecified: Secondary | ICD-10-CM | POA: Diagnosis present

## 2014-04-18 DIAGNOSIS — I10 Essential (primary) hypertension: Secondary | ICD-10-CM | POA: Diagnosis present

## 2014-04-18 DIAGNOSIS — I2510 Atherosclerotic heart disease of native coronary artery without angina pectoris: Secondary | ICD-10-CM | POA: Diagnosis present

## 2014-04-18 DIAGNOSIS — E1165 Type 2 diabetes mellitus with hyperglycemia: Secondary | ICD-10-CM | POA: Diagnosis present

## 2014-04-18 DIAGNOSIS — E669 Obesity, unspecified: Secondary | ICD-10-CM | POA: Diagnosis present

## 2014-04-18 DIAGNOSIS — R059 Cough, unspecified: Secondary | ICD-10-CM

## 2014-04-18 DIAGNOSIS — M25579 Pain in unspecified ankle and joints of unspecified foot: Secondary | ICD-10-CM

## 2014-04-18 DIAGNOSIS — I5043 Acute on chronic combined systolic (congestive) and diastolic (congestive) heart failure: Secondary | ICD-10-CM | POA: Diagnosis present

## 2014-04-18 DIAGNOSIS — I509 Heart failure, unspecified: Secondary | ICD-10-CM

## 2014-04-18 DIAGNOSIS — I252 Old myocardial infarction: Secondary | ICD-10-CM

## 2014-04-18 DIAGNOSIS — R05 Cough: Secondary | ICD-10-CM

## 2014-04-18 DIAGNOSIS — Z955 Presence of coronary angioplasty implant and graft: Secondary | ICD-10-CM

## 2014-04-18 DIAGNOSIS — Z6828 Body mass index (BMI) 28.0-28.9, adult: Secondary | ICD-10-CM

## 2014-04-18 DIAGNOSIS — Z951 Presence of aortocoronary bypass graft: Secondary | ICD-10-CM

## 2014-04-18 DIAGNOSIS — R600 Localized edema: Secondary | ICD-10-CM | POA: Diagnosis not present

## 2014-04-18 LAB — BASIC METABOLIC PANEL
Anion gap: 11 (ref 5–15)
BUN: 54 mg/dL — ABNORMAL HIGH (ref 6–23)
CALCIUM: 9.1 mg/dL (ref 8.4–10.5)
CO2: 19 mmol/L (ref 19–32)
CREATININE: 1.98 mg/dL — AB (ref 0.50–1.10)
Chloride: 106 mmol/L (ref 96–112)
GFR calc Af Amer: 28 mL/min — ABNORMAL LOW (ref >90)
GFR calc non Af Amer: 25 mL/min — ABNORMAL LOW (ref >90)
Glucose, Bld: 426 mg/dL — ABNORMAL HIGH (ref 70–99)
POTASSIUM: 4.8 mmol/L (ref 3.5–5.1)
Sodium: 136 mmol/L (ref 135–145)

## 2014-04-18 LAB — CBC
HCT: 37.1 % (ref 36.0–46.0)
Hemoglobin: 12.2 g/dL (ref 12.0–15.0)
MCH: 28.4 pg (ref 26.0–34.0)
MCHC: 32.9 g/dL (ref 30.0–36.0)
MCV: 86.3 fL (ref 78.0–100.0)
Platelets: 135 10*3/uL — ABNORMAL LOW (ref 150–400)
RBC: 4.3 MIL/uL (ref 3.87–5.11)
RDW: 17 % — ABNORMAL HIGH (ref 11.5–15.5)
WBC: 7.6 10*3/uL (ref 4.0–10.5)

## 2014-04-18 LAB — I-STAT TROPONIN, ED
Troponin i, poc: 0 ng/mL (ref 0.00–0.08)
Troponin i, poc: 0 ng/mL (ref 0.00–0.08)

## 2014-04-18 LAB — HEPATIC FUNCTION PANEL
ALK PHOS: 99 U/L (ref 39–117)
ALT: 60 U/L — ABNORMAL HIGH (ref 0–35)
AST: 48 U/L — AB (ref 0–37)
Albumin: 3.2 g/dL — ABNORMAL LOW (ref 3.5–5.2)
BILIRUBIN DIRECT: 0.4 mg/dL (ref 0.0–0.5)
BILIRUBIN INDIRECT: 0.8 mg/dL (ref 0.3–0.9)
TOTAL PROTEIN: 6.2 g/dL (ref 6.0–8.3)
Total Bilirubin: 1.2 mg/dL (ref 0.3–1.2)

## 2014-04-18 LAB — CBG MONITORING, ED
GLUCOSE-CAPILLARY: 366 mg/dL — AB (ref 70–99)
GLUCOSE-CAPILLARY: 298 mg/dL — AB (ref 70–99)

## 2014-04-18 LAB — PROTIME-INR
INR: 1.36 (ref 0.00–1.49)
Prothrombin Time: 16.9 seconds — ABNORMAL HIGH (ref 11.6–15.2)

## 2014-04-18 LAB — BRAIN NATRIURETIC PEPTIDE: B Natriuretic Peptide: >4500 pg/mL — ABNORMAL HIGH (ref 0.0–100.0)

## 2014-04-18 LAB — LIPASE, BLOOD: Lipase: 106 U/L — ABNORMAL HIGH (ref 11–59)

## 2014-04-18 MED ORDER — IPRATROPIUM BROMIDE 0.02 % IN SOLN
0.5000 mg | Freq: Once | RESPIRATORY_TRACT | Status: AC
  Administered 2014-04-18: 0.5 mg via RESPIRATORY_TRACT
  Filled 2014-04-18: qty 2.5

## 2014-04-18 MED ORDER — ALBUTEROL SULFATE (2.5 MG/3ML) 0.083% IN NEBU
5.0000 mg | INHALATION_SOLUTION | Freq: Once | RESPIRATORY_TRACT | Status: AC
  Administered 2014-04-18: 5 mg via RESPIRATORY_TRACT
  Filled 2014-04-18: qty 6

## 2014-04-18 MED ORDER — FENTANYL CITRATE 0.05 MG/ML IJ SOLN
25.0000 ug | Freq: Once | INTRAMUSCULAR | Status: AC
  Administered 2014-04-18: 25 ug via INTRAVENOUS
  Filled 2014-04-18: qty 2

## 2014-04-18 MED ORDER — HYDROCOD POLST-CHLORPHEN POLST 10-8 MG/5ML PO LQCR
5.0000 mL | Freq: Once | ORAL | Status: AC
  Administered 2014-04-18: 5 mL via ORAL
  Filled 2014-04-18: qty 5

## 2014-04-18 MED ORDER — FUROSEMIDE 10 MG/ML IJ SOLN
60.0000 mg | Freq: Once | INTRAMUSCULAR | Status: AC
  Administered 2014-04-18: 60 mg via INTRAVENOUS
  Filled 2014-04-18: qty 6

## 2014-04-18 MED ORDER — IPRATROPIUM BROMIDE HFA 17 MCG/ACT IN AERS: 2.0000 | INHALATION_SPRAY | Freq: Once | RESPIRATORY_TRACT | Status: DC

## 2014-04-18 MED ORDER — INSULIN ASPART 100 UNIT/ML ~~LOC~~ SOLN
8.0000 [IU] | Freq: Once | SUBCUTANEOUS | Status: AC
  Administered 2014-04-18: 8 [IU] via SUBCUTANEOUS
  Filled 2014-04-18: qty 1

## 2014-04-18 NOTE — ED Notes (Signed)
Blood glucose 298

## 2014-04-18 NOTE — Progress Notes (Signed)
Pt arrived to floor in NAD, pt A/Ox4, admitting MD at bedside

## 2014-04-18 NOTE — ED Notes (Addendum)
Pt here for abd pain and vomiting. sts also BLE swelling and some SOB,

## 2014-04-18 NOTE — ED Notes (Signed)
Blood glucose 366

## 2014-04-18 NOTE — ED Notes (Signed)
Attempted to contact us in regards to when patient would be taken for test, no answer in [place].

## 2014-04-18 NOTE — ED Provider Notes (Signed)
CSN: [id number]     Arrival date & time 04/18/14  1240 History   First MD Initiated Contact with Patient 04/18/14 1614     Chief Complaint  Patient presents with  . Emesis  . Abdominal Pain     (Consider location/radiation/quality/duration/timing/severity/associated sxs/prior Treatment) HPI Jo Lynn is a 69 y.o. female  with history of coronary disease and MI, ischemic cardiomyopathy, hypertension, diabetes, presents to emergency department complaining of cough, shortness of breath, nausea and vomiting for approximately week. Patient states that her symptoms really has been going on since her last hospitalization which was 2 months ago. States she was admitted for pneumonia. She states the symptoms did get worse in the last week. She reports shortness of breath on exertion and when laying down flat. She states she is only able to sleep a few hours at a time because of shortness of breath. She denies any chest pain. She admits to productive cough. No fever or chills. She reports some epigastric and right upper quadrant abdominal pain, states this comes and goes. Also has posttussive emesis. Patient went to see her primary care doctor a week ago, was given some cough medicine. Was told that could be from sinus drainage or from her recent pneumonia. States cough pills are not helping. Patient also reports lower extremity swelling that is worse than normal. Patient states she has been taking Lasix as she is prescribed. She has not been taking any over-the-counter treatment. She denies any other complaints.    Past Medical History  Diagnosis Date  . Myocardial infarction 10/2002    - MV CAD --> Referred for CABG (no follow-up post CABG)  . 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 Lucianne Lei Trigt: LIMA-LAD, SVG-RPDA, SVG-OM1-OM2  . ST elevation myocardial infarction  (STEMI) of inferolateral wall 09/09/2013    SVG-Om1-2 occluded - PCI in 2 locations; Occluded SVG-RCA & native RCA, LAD & native Cx occcluded.  EF ~20%  . Cardiomyopathy, ischemic 09/09/2013    EF ~20-25%; Large MI, existing occluded RCA & SVG-RCA; h/o CABG  . 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 CAD S/P percutaneous coronary angioplasty 09/09/2013    mid SVG-OM1-OM2 (prox LIMB) Rebel BMS 4.0 mm x 16 mm; sequential limb OM1-OM2: Rebel BMS 4.0 mm x 12 mm  . Combined systolic and diastolic congestive heart failure, NYHA class 3 09/09/2013    Echo post Inferolateral STEMI: Severely reduced LVEF ~20-35%; Akinesis of Inferolateral, Inferior & Inferoseptal walls; Grade 1 DDysfxn (initial LVEDP ~26 mmHg with SBP of 99 mHg)  . Essential hypertension 08/17/2012  . Hyperlipidemia with target LDL less than 70 08/17/2012  . Diabetes mellitus type 2 with complications 6/60/6301  . 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 Coronary artery bypass graft    . Left heart catheterization with coronary angiogram N/A 09/09/2013    Procedure: LEFT HEART CATHETERIZATION WITH CORONARY ANGIOGRAM;  Surgeon: Leonie Man, MD;  Location: [facility] CATH LAB;  Service: Cardiovascular;  Laterality: N/A;  .  Implantable cardioverter defibrillator implant  02-21-2014    STJ single chamber ICD implanted by Dr Lovena Le for primary prevention  . Implantable cardioverter defibrillator implant N/A 02/21/2014    Procedure: IMPLANTABLE CARDIOVERTER DEFIBRILLATOR IMPLANT;  Surgeon: Evans Lance, MD;  Location: [facility] CATH LAB;  Service: Cardiovascular;  Laterality: N/A;   Family History  Problem Relation Age of  Onset  . Diabetes Mother   . Diabetes Father    History  Substance Use Topics  . Smoking status: Former Research scientist (life sciences)  . Smokeless tobacco: Never Used  . Alcohol Use: No   OB History    No data available     Review of Systems  Constitutional: Negative for fever and chills.  HENT: Positive for congestion.   Respiratory: Positive for cough and shortness of breath. Negative for chest tightness.   Cardiovascular: Positive for leg swelling. Negative for chest pain and palpitations.  Gastrointestinal: Positive for nausea, vomiting and abdominal pain. Negative for diarrhea.  Genitourinary: Negative for dysuria, flank pain and pelvic pain.  Musculoskeletal: Negative for myalgias, arthralgias, neck pain and neck stiffness.  Skin: Negative for rash.  Neurological: Positive for weakness. Negative for dizziness and headaches.  All other systems reviewed and are negative.     Allergies  Review of patient's allergies indicates no known allergies.  Home Medications   Prior to Admission medications   Medication Sig Start Date End Date Taking? Authorizing Provider  aspirin EC 81 MG EC tablet Take 1 tablet (81 mg total) by mouth daily. 09/14/13  Yes Almyra Deforest, PA  clopidogrel (PLAVIX) 75 MG tablet Take 1 tablet (75 mg total) by mouth daily. 01/10/14  Yes Mary-Margaret Hassell Done, FNP  diclofenac sodium (VOLTAREN) 1 % GEL Apply 2 g topically 4 (four) times daily. Patient taking differently: Apply 2 g topically 4 (four) times daily as needed (pain).  11/14/13  Yes Mary-Margaret Hassell Done, FNP  furosemide (LASIX) 40 MG tablet Take 1.5 tablets (60 mg total) by mouth daily. 02/22/14  Yes Rhonda G Barrett, PA-C  losartan (COZAAR) 50 MG tablet Take 1 tablet (50 mg total) by mouth daily. 12/31/13  Yes Minus Breeding, MD  metoprolol succinate (TOPROL-XL) 25 MG 24 hr tablet Take 1 tablet (25 mg total) by mouth 2 (two) times daily. Take with or immediately following a meal. 02/22/14  Yes Rhonda G Barrett, PA-C   nitroGLYCERIN (NITROSTAT) 0.4 MG SL tablet Place 1 tablet (0.4 mg total) under the tongue every 5 (five) minutes as needed for chest pain. 12/01/13  Yes Minus Breeding, MD  sitaGLIPtin (JANUVIA) 100 MG tablet Take 1 tablet (100 mg total) by mouth daily. 03/16/14  Yes Mary-Margaret Hassell Done, FNP  benzonatate (TESSALON) 100 MG capsule Take 1 capsule (100 mg total) by mouth 2 (two) times daily as needed for cough. Patient not taking: Reported on 04/18/2014 02/12/14   Mary-Margaret Hassell Done, FNP   BP 106/70 mmHg  Pulse 63  Temp(Src) 97.6 F (36.4 C) (Oral)  Resp 19  SpO2 100% Physical Exam  Constitutional: She is oriented to person, place, and time. She appears well-developed and well-nourished. No distress.  HENT:  Head: Normocephalic.  Eyes: Conjunctivae are normal.  Neck: Neck supple.  Cardiovascular: Normal rate, regular rhythm and normal heart sounds.   Pulmonary/Chest: Effort normal and breath sounds normal. No respiratory distress. She has no wheezes. She has no rales.  Abdominal: Soft. Bowel sounds are normal. She exhibits no distension. There is tenderness. There is no rebound and no guarding.  Epigastric and RUQ tenderness, mild  Musculoskeletal:  2+ LE pitting edema  Neurological: She is alert and oriented to person, place, and time. No cranial nerve deficit.  Skin: Skin is warm and dry.  Psychiatric: She has a normal mood and affect. Her behavior is normal.  Nursing note and vitals reviewed.   ED Course  Procedures (including critical care time) Labs Review Labs Reviewed  CBC - Abnormal; Notable for the following:    RDW 17.0 (*)    Platelets 135 (*)    All other components within normal limits  BASIC METABOLIC PANEL - Abnormal; Notable for the following:    Glucose, Bld 426 (*)    BUN 54 (*)    Creatinine, Ser 1.98 (*)    GFR calc non Af Amer 25 (*)    GFR calc Af Amer 28 (*)    All other components within normal limits  BRAIN NATRIURETIC PEPTIDE - Abnormal; Notable  for the following:    B Natriuretic Peptide >4500.0 (*)    All other components within normal limits  PROTIME-INR - Abnormal; Notable for the following:    Prothrombin Time 16.9 (*)    All other components within normal limits  LIPASE, BLOOD - Abnormal; Notable for the following:    Lipase 106 (*)    All other components within normal limits  HEPATIC FUNCTION PANEL - Abnormal; Notable for the following:    Albumin 3.2 (*)    AST 48 (*)    ALT 60 (*)    All other components within normal limits  CBG MONITORING, ED - Abnormal; Notable for the following:    Glucose-Capillary 366 (*)    All other components within normal limits  CBG MONITORING, ED - Abnormal; Notable for the following:    Glucose-Capillary 298 (*)    All other components within normal limits  I-STAT TROPOININ, ED  Randolm Idol, ED    Imaging Review Dg Chest 2 View  04/18/2014   CLINICAL DATA:  Cough for 1 week.  EXAM: CHEST  2 VIEW  COMPARISON:  02/22/2014  FINDINGS: Left single lead pacer tip is in the right ventricle, unchanged. Prior CABG. Mild cardiomegaly. No confluent opacities or effusions. No acute bony abnormality. Degenerative changes in the thoracic spine.  IMPRESSION: Cardiomegaly.  No active disease.   Electronically Signed   By: Rolm Baptise M.D.   On: 04/18/2014 17:46   US Abdomen Complete  04/18/2014   CLINICAL DATA:  Abdominal pain and vomiting.  EXAM: ULTRASOUND ABDOMEN COMPLETE  COMPARISON:  Prior abdominal ultrasound on 02/16/2014  FINDINGS: Gallbladder: Stable appearance of cholelithiasis. The gallbladder is not distended. Stable top-normal wall thickness. No sonographic Murphy's sign.  Common bile duct: Diameter: 7 mm in maximum measured diameter. No visualized calculi in the common bile duct by ultrasound.  Liver: No focal lesion identified. Within normal limits in parenchymal echogenicity. No evidence of intrahepatic biliary ductal dilatation.  IVC: No abnormality visualized.  Pancreas: The  pancreas is not visualized by ultrasound.  Spleen: Size and appearance within normal limits.  Right Kidney: Length: 12.3 cm. No hydronephrosis. Hyperechoic focus in the lower pole may represent a nonobstructing calculus.  Left Kidney: Length: 11.0 cm. No hydronephrosis. Stable simple cyst of the upper pole measures 6.3 cm in greatest diameter.  Abdominal aorta: No aneurysm visualized.  Other findings: None.  IMPRESSION: Stable appearance of cholelithiasis. Mildly prominent gallbladder wall thickness is similar to the prior study and may relate to under distention. No overt signs of cholecystitis or biliary obstruction.  Electronically Signed   By: Aletta Edouard M.D.   On: 04/18/2014 21:05   Dg Abd 2 Views  04/18/2014   CLINICAL DATA:  Vomiting for 1 week.  Epigastric pain for 1 week.  EXAM: ABDOMEN - 2 VIEW  COMPARISON:  None.  FINDINGS: Nonobstructive bowel gas pattern.  No free air organomegaly.  A mortise calcifications noted within the Mid and left pelvis. These are of unknown etiology. These appear too diffuse to represent calcified fibroids in may be peritoneal. May consider abdomen and pelvic CT for further evaluation if felt clinically indicated.  IMPRESSION: Extensive calcifications in the pelvis of unknown etiology, which appear too diffuse to represent calcified fibroids. Question peritoneal calcifications. These could be further evaluated with CT if felt clinically indicated.  No evidence of bowel obstruction or free air.   Electronically Signed   By: Rolm Baptise M.D.   On: 04/18/2014 17:50     Date: 04/18/2014  Rate: 60  Rhythm: normal sinus rhythm  QRS Axis: normal  Intervals: normal  ST/T Wave abnormalities: nonspecific T wave changes  Conduction Disutrbances:none  Narrative Interpretation:   Old EKG Reviewed: unchanged    MDM   Final diagnoses:  Abdominal pain  Cough  Congestive heart failure, unspecified congestive heart failure chronicity, unspecified congestive heart  failure type  Non-intractable vomiting with nausea, vomiting of unspecified type    Patient seen and examined. Patient with history of coronary disease and CHF, here with shortness of breath, cough, orthopnea, posttussive emesis. Recent treatment for pneumonia 2 months ago with hospitalization. Labs already obtained by triage nurse, showing hyperglycemia with glucose of 426. Will order insulin 8 units subcutaneous. Patient has chronically elevated BUN and creatinine, appears to be at baseline at this time. BNP is greater than 4500, most likely the cause of symptoms. Will get chest x-ray and abdominal x-ray. IV Lasix and inhaler ordered.  7:00 PM Xray negative. Pt just received lasix. VS normal. Pt feeling better, laying down in bed, NAD. No vomiting. Lipase and LFTs slightly elevated. Hx of elevated LFTs and cholelithiasis. Given vomiting, abdominal pain, elevated lipase and lfts, will get [place] to r/o cholecystitis. Advised that pt may need to be admitted, given possible pancreatitis, and significant CHF. Pt does not want to be admitted at this time, asked for some time to think about it.   Patient's ultrasound shows stable cholelithiasis, no cholecystitis. Patient is uncomfortable again, sitting upright, short of breath. She agrees to being admitted. I think patient needs diuresis, treatment for nausea and vomiting, possible pancreatitis. I spoke with tried hospitalist, they will admit patient.  Filed Vitals:   04/18/14 2215 04/18/14 2223 04/18/14 2245 04/18/14 2303  BP: 101/54 101/54 111/63 105/70  Pulse: 62 57 57 57  Temp:  97.3 F (36.3 C)  97.9 F (36.6 C)  TempSrc:  Oral  Oral  Resp:  13 20 17   Height:    5\' 7"  (1.702 m)  Weight:    189 lb 8 oz (85.957 kg)  SpO2: 92% 100% 100% 100%     Renold Genta, PA-C 04/18/14 2346  Charlesetta Shanks, MD 04/20/14 [phone]

## 2014-04-18 NOTE — ED Notes (Addendum)
Pt reports a productive cough that induces vomiting as well as some lower abd pain that has been going on x1 week. Pt denies chest pain, diarrhea, and fever but also reports some edema in bilateral legs and sob.Pts Daughter states she feels like patient is having some anxiety. Pt alert, oriented, nad.

## 2014-04-18 NOTE — ED Notes (Signed)
Patient transported to X-ray 

## 2014-04-18 NOTE — ED Notes (Signed)
Ultrasound contacted, advised they are coming to take patient at this time.

## 2014-04-19 ENCOUNTER — Encounter (HOSPITAL_COMMUNITY): Payer: Self-pay | Admitting: Internal Medicine

## 2014-04-19 ENCOUNTER — Inpatient Hospital Stay (HOSPITAL_COMMUNITY): Payer: BLUE CROSS/BLUE SHIELD

## 2014-04-19 DIAGNOSIS — E1165 Type 2 diabetes mellitus with hyperglycemia: Secondary | ICD-10-CM | POA: Diagnosis present

## 2014-04-19 DIAGNOSIS — I255 Ischemic cardiomyopathy: Secondary | ICD-10-CM

## 2014-04-19 DIAGNOSIS — I5043 Acute on chronic combined systolic (congestive) and diastolic (congestive) heart failure: Principal | ICD-10-CM

## 2014-04-19 DIAGNOSIS — N184 Chronic kidney disease, stage 4 (severe): Secondary | ICD-10-CM | POA: Diagnosis present

## 2014-04-19 DIAGNOSIS — I2510 Atherosclerotic heart disease of native coronary artery without angina pectoris: Secondary | ICD-10-CM

## 2014-04-19 DIAGNOSIS — R112 Nausea with vomiting, unspecified: Secondary | ICD-10-CM | POA: Diagnosis present

## 2014-04-19 DIAGNOSIS — IMO0002 Reserved for concepts with insufficient information to code with codable children: Secondary | ICD-10-CM | POA: Diagnosis present

## 2014-04-19 DIAGNOSIS — I5023 Acute on chronic systolic (congestive) heart failure: Secondary | ICD-10-CM

## 2014-04-19 DIAGNOSIS — I10 Essential (primary) hypertension: Secondary | ICD-10-CM

## 2014-04-19 DIAGNOSIS — N183 Chronic kidney disease, stage 3 (moderate): Secondary | ICD-10-CM

## 2014-04-19 LAB — CBC WITH DIFFERENTIAL/PLATELET
Basophils Absolute: 0 10*3/uL (ref 0.0–0.1)
Basophils Relative: 0 % (ref 0–1)
Eosinophils Absolute: 0.1 10*3/uL (ref 0.0–0.7)
Eosinophils Relative: 1 % (ref 0–5)
HCT: 38.6 % (ref 36.0–46.0)
Hemoglobin: 12.6 g/dL (ref 12.0–15.0)
Lymphocytes Relative: 32 % (ref 12–46)
Lymphs Abs: 2.7 10*3/uL (ref 0.7–4.0)
MCH: 28.4 pg (ref 26.0–34.0)
MCHC: 32.6 g/dL (ref 30.0–36.0)
MCV: 86.9 fL (ref 78.0–100.0)
Monocytes Absolute: 0.6 10*3/uL (ref 0.1–1.0)
Monocytes Relative: 7 % (ref 3–12)
Neutro Abs: 5.1 10*3/uL (ref 1.7–7.7)
Neutrophils Relative %: 59 % (ref 43–77)
Platelets: 132 10*3/uL — ABNORMAL LOW (ref 150–400)
RBC: 4.44 MIL/uL (ref 3.87–5.11)
RDW: 17.1 % — AB (ref 11.5–15.5)
WBC: 8.6 10*3/uL (ref 4.0–10.5)

## 2014-04-19 LAB — GLUCOSE, CAPILLARY
GLUCOSE-CAPILLARY: 201 mg/dL — AB (ref 70–99)
Glucose-Capillary: 143 mg/dL — ABNORMAL HIGH (ref 70–99)
Glucose-Capillary: 170 mg/dL — ABNORMAL HIGH (ref 70–99)
Glucose-Capillary: 192 mg/dL — ABNORMAL HIGH (ref 70–99)
Glucose-Capillary: 195 mg/dL — ABNORMAL HIGH (ref 70–99)

## 2014-04-19 LAB — TROPONIN I
TROPONIN I: 0.07 ng/mL — AB (ref <0.031)
Troponin I: <0.03 ng/mL (ref <0.031)

## 2014-04-19 LAB — COMPREHENSIVE METABOLIC PANEL
ALK PHOS: 107 U/L (ref 39–117)
ALT: 67 U/L — AB (ref 0–35)
AST: 52 U/L — ABNORMAL HIGH (ref 0–37)
Albumin: 3.4 g/dL — ABNORMAL LOW (ref 3.5–5.2)
Anion gap: 13 (ref 5–15)
BUN: 61 mg/dL — ABNORMAL HIGH (ref 6–23)
CHLORIDE: 109 mmol/L (ref 96–112)
CO2: 18 mmol/L — AB (ref 19–32)
Calcium: 9.4 mg/dL (ref 8.4–10.5)
Creatinine, Ser: 2.71 mg/dL — ABNORMAL HIGH (ref 0.50–1.10)
GFR calc Af Amer: 19 mL/min — ABNORMAL LOW (ref >90)
GFR, EST NON AFRICAN AMERICAN: 17 mL/min — AB (ref >90)
Glucose, Bld: 187 mg/dL — ABNORMAL HIGH (ref 70–99)
POTASSIUM: 4.7 mmol/L (ref 3.5–5.1)
SODIUM: 140 mmol/L (ref 135–145)
Total Bilirubin: 1.8 mg/dL — ABNORMAL HIGH (ref 0.3–1.2)
Total Protein: 6.8 g/dL (ref 6.0–8.3)

## 2014-04-19 LAB — TSH: TSH: 0.942 u[IU]/mL (ref 0.350–4.500)

## 2014-04-19 LAB — LIPASE, BLOOD: LIPASE: 73 U/L — AB (ref 11–59)

## 2014-04-19 MED ORDER — HEPARIN SODIUM (PORCINE) 5000 UNIT/ML IJ SOLN
5000.0000 [IU] | Freq: Three times a day (TID) | INTRAMUSCULAR | Status: DC
  Administered 2014-04-19 – 2014-04-22 (×9): 5000 [IU] via SUBCUTANEOUS
  Filled 2014-04-19 (×12): qty 1

## 2014-04-19 MED ORDER — ONDANSETRON HCL 4 MG/2ML IJ SOLN: 4.0000 mg | Freq: Four times a day (QID) | INTRAMUSCULAR | Status: DC | PRN

## 2014-04-19 MED ORDER — ENOXAPARIN SODIUM 40 MG/0.4ML ~~LOC~~ SOLN: 40.0000 mg | Freq: Every day | SUBCUTANEOUS | Status: DC

## 2014-04-19 MED ORDER — FUROSEMIDE 10 MG/ML IJ SOLN
60.0000 mg | Freq: Two times a day (BID) | INTRAMUSCULAR | Status: DC
  Administered 2014-04-19: 60 mg via INTRAVENOUS
  Filled 2014-04-19 (×3): qty 6

## 2014-04-19 MED ORDER — LOSARTAN POTASSIUM 50 MG PO TABS
50.0000 mg | ORAL_TABLET | Freq: Every day | ORAL | Status: DC
  Filled 2014-04-19: qty 1

## 2014-04-19 MED ORDER — SINCALIDE 5 MCG IJ SOLR
INTRAMUSCULAR | Status: AC
  Filled 2014-04-19: qty 5

## 2014-04-19 MED ORDER — ISOSORBIDE MONONITRATE ER 30 MG PO TB24
30.0000 mg | ORAL_TABLET | Freq: Every day | ORAL | Status: DC
  Administered 2014-04-19 – 2014-04-22 (×3): 30 mg via ORAL
  Filled 2014-04-19 (×4): qty 1

## 2014-04-19 MED ORDER — CLOPIDOGREL BISULFATE 75 MG PO TABS
75.0000 mg | ORAL_TABLET | Freq: Every day | ORAL | Status: DC
  Administered 2014-04-19 – 2014-04-22 (×4): 75 mg via ORAL
  Filled 2014-04-19 (×4): qty 1

## 2014-04-19 MED ORDER — ACETAMINOPHEN 325 MG PO TABS
650.0000 mg | ORAL_TABLET | Freq: Four times a day (QID) | ORAL | Status: DC | PRN
  Administered 2014-04-21 (×2): 650 mg via ORAL
  Filled 2014-04-19 (×3): qty 2

## 2014-04-19 MED ORDER — FUROSEMIDE 10 MG/ML IJ SOLN
40.0000 mg | Freq: Two times a day (BID) | INTRAMUSCULAR | Status: DC
  Administered 2014-04-19 – 2014-04-20 (×2): 40 mg via INTRAVENOUS
  Filled 2014-04-19 (×3): qty 4

## 2014-04-19 MED ORDER — ASPIRIN EC 81 MG PO TBEC
81.0000 mg | DELAYED_RELEASE_TABLET | Freq: Every day | ORAL | Status: DC
  Administered 2014-04-19 – 2014-04-22 (×4): 81 mg via ORAL
  Filled 2014-04-19 (×4): qty 1

## 2014-04-19 MED ORDER — HEPARIN BOLUS VIA INFUSION
4000.0000 [IU] | Freq: Once | INTRAVENOUS | Status: AC
  Administered 2014-04-19: 4000 [IU] via INTRAVENOUS
  Filled 2014-04-19: qty 4000

## 2014-04-19 MED ORDER — TECHNETIUM TC 99M MEBROFENIN IV KIT: 5.0000 | PACK | Freq: Once | INTRAVENOUS | Status: AC | PRN

## 2014-04-19 MED ORDER — HEPARIN (PORCINE) IN NACL 100-0.45 UNIT/ML-% IJ SOLN
900.0000 [IU]/h | INTRAMUSCULAR | Status: DC
  Administered 2014-04-19: 900 [IU]/h via INTRAVENOUS
  Filled 2014-04-19: qty 250

## 2014-04-19 MED ORDER — SINCALIDE 5 MCG IJ SOLR
0.0200 ug/kg | Freq: Once | INTRAMUSCULAR | Status: DC
  Filled 2014-04-19: qty 5

## 2014-04-19 MED ORDER — NITROGLYCERIN 0.4 MG SL SUBL: 0.4000 mg | SUBLINGUAL_TABLET | SUBLINGUAL | Status: DC | PRN

## 2014-04-19 MED ORDER — ATORVASTATIN CALCIUM 20 MG PO TABS
20.0000 mg | ORAL_TABLET | Freq: Every day | ORAL | Status: DC
  Administered 2014-04-20 – 2014-04-21 (×2): 20 mg via ORAL
  Filled 2014-04-19 (×3): qty 1

## 2014-04-19 MED ORDER — STERILE WATER FOR INJECTION IJ SOLN
INTRAMUSCULAR | Status: AC
  Filled 2014-04-19: qty 10

## 2014-04-19 MED ORDER — ACETAMINOPHEN 650 MG RE SUPP: 650.0000 mg | Freq: Four times a day (QID) | RECTAL | Status: DC | PRN

## 2014-04-19 MED ORDER — HYDROCOD POLST-CHLORPHEN POLST 10-8 MG/5ML PO LQCR
5.0000 mL | Freq: Once | ORAL | Status: AC
Start: 2014-04-19 — End: 2014-04-19
  Administered 2014-04-19: 5 mL via ORAL
  Filled 2014-04-19: qty 5

## 2014-04-19 MED ORDER — INSULIN ASPART 100 UNIT/ML ~~LOC~~ SOLN
0.0000 [IU] | Freq: Three times a day (TID) | SUBCUTANEOUS | Status: DC
Start: 2014-04-19 — End: 2014-04-21
  Administered 2014-04-19: 3 [IU] via SUBCUTANEOUS
  Administered 2014-04-19: 2 [IU] via SUBCUTANEOUS
  Administered 2014-04-20: 7 [IU] via SUBCUTANEOUS
  Administered 2014-04-20: 2 [IU] via SUBCUTANEOUS
  Administered 2014-04-20: 3 [IU] via SUBCUTANEOUS
  Administered 2014-04-21: 7 [IU] via SUBCUTANEOUS

## 2014-04-19 MED ORDER — HYDROCOD POLST-CHLORPHEN POLST 10-8 MG/5ML PO LQCR
5.0000 mL | Freq: Two times a day (BID) | ORAL | Status: DC | PRN
  Administered 2014-04-20 – 2014-04-21 (×2): 5 mL via ORAL
  Filled 2014-04-19 (×2): qty 5

## 2014-04-19 MED ORDER — LINAGLIPTIN 5 MG PO TABS
5.0000 mg | ORAL_TABLET | Freq: Every day | ORAL | Status: DC
  Administered 2014-04-19 – 2014-04-22 (×4): 5 mg via ORAL
  Filled 2014-04-19 (×4): qty 1

## 2014-04-19 MED ORDER — SODIUM CHLORIDE 0.9 % IJ SOLN
3.0000 mL | Freq: Two times a day (BID) | INTRAMUSCULAR | Status: DC
  Administered 2014-04-19 – 2014-04-22 (×8): 3 mL via INTRAVENOUS

## 2014-04-19 MED ORDER — ONDANSETRON HCL 4 MG PO TABS: 4.0000 mg | ORAL_TABLET | Freq: Four times a day (QID) | ORAL | Status: DC | PRN

## 2014-04-19 MED ORDER — HYDRALAZINE HCL 10 MG PO TABS
10.0000 mg | ORAL_TABLET | Freq: Three times a day (TID) | ORAL | Status: DC
Start: 2014-04-19 — End: 2014-04-20
  Administered 2014-04-19 – 2014-04-20 (×2): 10 mg via ORAL
  Filled 2014-04-19 (×6): qty 1

## 2014-04-19 MED ORDER — FENTANYL CITRATE 0.05 MG/ML IJ SOLN
12.5000 ug | INTRAMUSCULAR | Status: DC | PRN
  Administered 2014-04-19 – 2014-04-20 (×2): 12.5 ug via INTRAVENOUS
  Filled 2014-04-19 (×2): qty 2

## 2014-04-19 MED ORDER — METOPROLOL SUCCINATE ER 25 MG PO TB24
25.0000 mg | ORAL_TABLET | Freq: Two times a day (BID) | ORAL | Status: DC
  Filled 2014-04-19 (×3): qty 1

## 2014-04-19 NOTE — Progress Notes (Signed)
MD gave verbal order to hold all meds until HIDA scan complete and make NPO. Will proceed with orders. Graceann Congress

## 2014-04-19 NOTE — H&P (Signed)
[facility] History and Physical  Silvanna Ohmer SFK:[id number] DOB: 12-06-1945 DOA: 04/18/2014  Referring physician: ER physician. PCP: Chevis Pretty, FNP   Chief Complaint: Increasing lower extremity edema.  HPI: Jo Lynn is a 69 y.o. female with history of ischemic cardiomyopathy large tear measured was 20-25%, status post recent ICD placement, CAD status post CABG and stent placement, diabetes mellitus, hypertension, hyperlipidemia presents to the ER because of worsening lower extremity edema. In addition patient also has been having nausea vomiting. Patient states that she has been having cough over the last 2 months which has been progressively worsening and also patient has noticed increasing swelling of the lower extremities over the last few weeks. Patient also O the last couple of days has been having nausea vomiting which is usually followed by coughing spells. Patient was treated for pneumonia in December of last 2 months ago. Denies any fever chills abdominal pain diarrhea. In the ER chest x-ray does not show any definite infiltrates or congestion. BNP is markedly elevated. On exam patient has bilateral lower extremity edema and has been admitted for decompensated CHF. Patient states that patient's cardiologist had recently increased her Lasix dose. Patient also has been complaining of increasing right ankle pain for which patient has had ABI done by patient's cardiologist which did not show any critical ischemia.  Review of Systems: As presented in the history of presenting illness, rest negative.  Past Medical History  Diagnosis Date  . Myocardial infarction 10/2002    - MV CAD --> Referred for CABG (no follow-up post CABG)  . 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 Lucianne Lei Trigt: LIMA-LAD, SVG-RPDA, SVG-OM1-OM2  . ST elevation  myocardial infarction (STEMI) of inferolateral wall 09/09/2013    SVG-Om1-2 occluded - PCI in 2 locations; Occluded SVG-RCA & native RCA, LAD & native Cx occcluded.  EF ~20%  . Cardiomyopathy, ischemic 09/09/2013    EF ~20-25%; Large MI, existing occluded RCA & SVG-RCA; h/o CABG  . 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 CAD S/P percutaneous coronary angioplasty 09/09/2013    mid SVG-OM1-OM2 (prox LIMB) Rebel BMS 4.0 mm x 16 mm; sequential limb OM1-OM2: Rebel BMS 4.0 mm x 12 mm  . Combined systolic and diastolic congestive heart failure, NYHA class 3 09/09/2013    Echo post Inferolateral STEMI: Severely reduced LVEF ~20-35%; Akinesis of Inferolateral, Inferior & Inferoseptal walls; Grade 1 DDysfxn (initial LVEDP ~26 mmHg with SBP of 99 mHg)  . Essential hypertension 08/17/2012  . Hyperlipidemia with target LDL less than 70 08/17/2012  . Diabetes mellitus type 2 with complications 1/74/9449  . 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 Coronary artery bypass graft    . Left heart catheterization with coronary angiogram N/A 09/09/2013    Procedure: LEFT HEART CATHETERIZATION WITH CORONARY ANGIOGRAM;  Surgeon: Leonie Man, MD;  Location: [facility] CATH LAB;  Service: Cardiovascular;  Laterality: N/A;  . Implantable cardioverter defibrillator implant  02-21-2014    STJ single chamber ICD implanted  by Dr Lovena Le for primary prevention  . Implantable cardioverter defibrillator implant N/A 02/21/2014    Procedure: IMPLANTABLE CARDIOVERTER DEFIBRILLATOR IMPLANT;  Surgeon: Evans Lance, MD;  Location: [facility] CATH LAB;  Service: Cardiovascular;  Laterality: N/A;   Social History:   reports that she has quit smoking. She has never used smokeless tobacco. She reports that she does not drink alcohol or use illicit drugs. Where does patient live home. Can patient participate in ADLs? Yes.  No Known Allergies  Family History:  Family History  Problem Relation Age of Onset  . Diabetes Mother   . Diabetes Father       Prior to Admission medications   Medication Sig Start Date End Date Taking? Authorizing Provider  aspirin EC 81 MG EC tablet Take 1 tablet (81 mg total) by mouth daily. 09/14/13  Yes Almyra Deforest, PA  clopidogrel (PLAVIX) 75 MG tablet Take 1 tablet (75 mg total) by mouth daily. 01/10/14  Yes Mary-Margaret Hassell Done, FNP  diclofenac sodium (VOLTAREN) 1 % GEL Apply 2 g topically 4 (four) times daily. Patient taking differently: Apply 2 g topically 4 (four) times daily as needed (pain).  11/14/13  Yes Mary-Margaret Hassell Done, FNP  furosemide (LASIX) 40 MG tablet Take 1.5 tablets (60 mg total) by mouth daily. 02/22/14  Yes Rhonda G Barrett, PA-C  losartan (COZAAR) 50 MG tablet Take 1 tablet (50 mg total) by mouth daily. 12/31/13  Yes Minus Breeding, MD  metoprolol succinate (TOPROL-XL) 25 MG 24 hr tablet Take 1 tablet (25 mg total) by mouth 2 (two) times daily. Take with or immediately following a meal. 02/22/14  Yes Rhonda G Barrett, PA-C  nitroGLYCERIN (NITROSTAT) 0.4 MG SL tablet Place 1 tablet (0.4 mg total) under the tongue every 5 (five) minutes as needed for chest pain. 12/01/13  Yes Minus Breeding, MD  sitaGLIPtin (JANUVIA) 100 MG tablet Take 1 tablet (100 mg total) by mouth daily. 03/16/14  Yes Mary-Margaret Hassell Done, FNP  benzonatate (TESSALON) 100 MG capsule Take 1 capsule (100 mg total) by mouth 2 (two) times daily as needed for cough. Patient not taking: Reported on 04/18/2014 02/12/14   Mary-Margaret Hassell Done, FNP    Physical Exam: Filed Vitals:   04/18/14 2215 04/18/14 2223 04/18/14 2245 04/18/14 2303  BP: 101/54 101/54 111/63 105/70  Pulse: 62 57 57 57  Temp:   97.3 F (36.3 C)  97.9 F (36.6 C)  TempSrc:  Oral  Oral  Resp:  13 20 17   Height:    5\' 7"  (1.702 m)  Weight:    85.957 kg (189 lb 8 oz)  SpO2: 92% 100% 100% 100%     General:  Obese not in distress.  Eyes: Anicteric no pallor.  ENT: No discharge from the ears eyes nose and mouth.  Neck: No JVD appreciated no mass felt.  Cardiovascular: S1-S2 heard.  Respiratory: No rhonchi or crepitations.  Abdomen: Soft nontender bowel sounds present. No guarding or rigidity.  Skin: No rash.  Musculoskeletal: Bilateral lower extremity edema up to the thighs.  Psychiatric: Appears normal.  Neurologic: Alert awake oriented to time place and person. Moves all extremities.  Labs on Admission:  Basic Metabolic Panel:  Recent Labs Lab 04/18/14 1302  NA 136  K 4.8  CL 106  CO2 19  GLUCOSE 426*  BUN 54*  CREATININE 1.98*  CALCIUM 9.1   Liver Function Tests:  Recent Labs Lab 04/18/14 1302  AST 48*  ALT 60*  ALKPHOS 99  BILITOT 1.2  PROT 6.2  ALBUMIN 3.2*    Recent Labs Lab 04/18/14 1302  LIPASE 106*   No results for input(s): AMMONIA in the last 168 hours. CBC:  Recent Labs Lab 04/18/14 1302  WBC 7.6  HGB 12.2  HCT 37.1  MCV 86.3  PLT 135*   Cardiac Enzymes: No results for input(s): CKTOTAL, CKMB, CKMBINDEX, TROPONINI in the last 168 hours.  BNP (last 3 results)  Recent Labs  02/15/14 2147 02/19/14 1303 04/18/14 1302  BNP 4826.4* 4653.0* >4500.0*    ProBNP (last 3 results)  Recent Labs  09/09/13 1055 09/11/13 0850 09/13/13 0850  PROBNP 2561.0* 9429.0* 2838.0*    CBG:  Recent Labs Lab 04/18/14 1713 04/18/14 1903  GLUCAP 366* 298*    Radiological Exams on Admission: Dg Chest 2 View  04/18/2014   CLINICAL DATA:  Cough for 1 week.  EXAM: CHEST  2 VIEW  COMPARISON:  02/22/2014  FINDINGS: Left single lead pacer tip is in the right ventricle, unchanged. Prior CABG. Mild cardiomegaly. No confluent opacities or effusions. No acute bony  abnormality. Degenerative changes in the thoracic spine.  IMPRESSION: Cardiomegaly.  No active disease.   Electronically Signed   By: Rolm Baptise M.D.   On: 04/18/2014 17:46   US Abdomen Complete  04/18/2014   CLINICAL DATA:  Abdominal pain and vomiting.  EXAM: ULTRASOUND ABDOMEN COMPLETE  COMPARISON:  Prior abdominal ultrasound on 02/16/2014  FINDINGS: Gallbladder: Stable appearance of cholelithiasis. The gallbladder is not distended. Stable top-normal wall thickness. No sonographic Murphy's sign.  Common bile duct: Diameter: 7 mm in maximum measured diameter. No visualized calculi in the common bile duct by ultrasound.  Liver: No focal lesion identified. Within normal limits in parenchymal echogenicity. No evidence of intrahepatic biliary ductal dilatation.  IVC: No abnormality visualized.  Pancreas: The pancreas is not visualized by ultrasound.  Spleen: Size and appearance within normal limits.  Right Kidney: Length: 12.3 cm. No hydronephrosis. Hyperechoic focus in the lower pole may represent a nonobstructing calculus.  Left Kidney: Length: 11.0 cm. No hydronephrosis. Stable simple cyst of the upper pole measures 6.3 cm in greatest diameter.  Abdominal aorta: No aneurysm visualized.  Other findings: None.  IMPRESSION: Stable appearance of cholelithiasis. Mildly prominent gallbladder wall thickness is similar to the prior study and may relate to under distention. No overt signs of cholecystitis or biliary obstruction.   Electronically Signed   By: Aletta Edouard M.D.   On: 04/18/2014 21:05   Dg Abd 2 Views  04/18/2014   CLINICAL DATA:  Vomiting for 1 week.  Epigastric pain for 1 week.  EXAM: ABDOMEN - 2 VIEW  COMPARISON:  None.  FINDINGS: Nonobstructive bowel gas pattern.  No free air organomegaly.  A mortise calcifications noted within the Mid and left pelvis. These are of unknown etiology. These appear too diffuse to represent calcified fibroids in may be peritoneal. May consider abdomen and pelvic CT  for further evaluation if felt clinically indicated.  IMPRESSION: Extensive calcifications in the pelvis of unknown etiology, which appear too diffuse to represent calcified fibroids. Question peritoneal calcifications. These could be further evaluated with CT if felt clinically indicated.  No evidence of bowel obstruction or free air.   Electronically Signed   By: Rolm Baptise M.D.   On: 04/18/2014 17:50    EKG: Independently reviewed. Normal sinus rhythm.  Assessment/Plan Active Problems:   Essential hypertension   Acute on chronic combined systolic and diastolic CHF (congestive heart failure) - in setting of recent Large Inferolateral STEMI with  existing RCA/SVG-RCA occlusion   Cardiomyopathy, ischemic - EF ~20-25%; Large MI, existing occluded RCA & SVG-RCA; h/o CABG   CHF (congestive heart failure)   CKD (chronic kidney disease) stage 3, GFR 30-59 ml/min   Diabetes mellitus type 2, uncontrolled   Nausea with vomiting   1. Decompensated CHF last EF measured was 01-74% with diastolic dysfunction - patient has been placed on Lasix 60 mg IV every 12 and closely following day garbled and metabolic panel and daily weights. Cycle cardiac markers. Check Dopplers. May have to hold losartan if there is worsening of renal function. 2. Nausea vomiting - probably preceded by cough. Abdomen appears benign. Sonogram of the abdomen done in the ER shows gallstones with mildly elevated lipase. We will recheck lipase in a.m. along with LFTs and also get a HIDA scan. 3. CAD status post CABG and stenting - cycle cardiac markers given worsening CHF. Continue Plavix and metoprolol. 4. Uncontrolled diabetes mellitus type 2 - patient did receive subcutaneous NovoLog in the ER. We'll closely follow CBGs with sliding scale coverage. 5. Chronic disease stage III - closely follow metabolic panel as patient is on increased dose of IV Lasix. If there is worsening of renal function may have to hold  losartan. 6. Thrombocytopenia - closely follow CBC. 7. Hypertension - continue home medications. See #1 and 3. 8. Right ankle pain - patient had ABIs done at cardiologist office which as per the cardiologist was showing no critical ischemia. I have ordered x-ray of the right ankle.   DVT Prophylaxis Lovenox.  Code Status: Full code.  Family Communication: Patient's daughter at the bedside.  Disposition Plan: Admit to inpatient.    Kenwood Rosiak N. [facility] Pager [phone].  If 7PM-7AM, please contact night-coverage [email or web address] Password [facility] 04/19/2014, 12:11 AM

## 2014-04-19 NOTE — Progress Notes (Signed)
Heparin gtt dual sign off with Ronnie Columbres.

## 2014-04-19 NOTE — Consult Note (Signed)
Primary cardiologist: [facility]   HPI: 69 year old female with past medical history of coronary artery disease, ischemic cardiomyopathy, renal insufficiency for evaluation of acute on chronic systolic congestive heart failure. Patient is status post coronary artery bypass graft in September 2004. Patient had a myocardial infarction in July 2015. Cardiac catheterization revealed severe native coronary artery disease. There was an acute occlusion of the proximal limb of the saphenous vein graft to the OM1 and OM2 with a second 95% lesion beyond the anastomosis and the sequential limb. Patient had PCI at that time. The LIMA to the LAD was patent. There was 100% occlusion of the saphenous vein graft to the right coronary artery. Ejection fraction 20%. Last echocardiogram in November 2015 showed an ejection fraction of 25%. There was grade 2 diastolic dysfunction. There was moderate mitral regurgitation and mild left atrial enlargement. There was moderate pulmonary hypertension. Patient had ICD implanted in December 2015. Patient states that since December she has had a cough. She has had mild dyspnea on exertion recently with increased cough productive of white sputum. No fevers, chills or hemoptysis. She has chronic orthopnea but no PND. She has developed bilateral worsening lower extremity edema. Admitted for congestive heart failure and cardiology asked to evaluate. Note patient does have nausea but it occurs after coughing.  Medications Prior to Admission  Medication Sig Dispense Refill  . aspirin EC 81 MG EC tablet Take 1 tablet (81 mg total) by mouth daily.    . clopidogrel (PLAVIX) 75 MG tablet Take 1 tablet (75 mg total) by mouth daily. 90 tablet 3  . diclofenac sodium (VOLTAREN) 1 % GEL Apply 2 g topically 4 (four) times daily. (Patient taking differently: Apply 2 g topically 4 (four) times daily as needed (pain). ) 5 Tube 3  . furosemide (LASIX) 40 MG tablet Take 1.5 tablets (60 mg total) by mouth  daily. 50 tablet 11  . losartan (COZAAR) 50 MG tablet Take 1 tablet (50 mg total) by mouth daily. 30 tablet 6  . metoprolol succinate (TOPROL-XL) 25 MG 24 hr tablet Take 1 tablet (25 mg total) by mouth 2 (two) times daily. Take with or immediately following a meal. 60 tablet 11  . nitroGLYCERIN (NITROSTAT) 0.4 MG SL tablet Place 1 tablet (0.4 mg total) under the tongue every 5 (five) minutes as needed for chest pain. 90 tablet 3  . sitaGLIPtin (JANUVIA) 100 MG tablet Take 1 tablet (100 mg total) by mouth daily. 90 tablet 0  . benzonatate (TESSALON) 100 MG capsule Take 1 capsule (100 mg total) by mouth 2 (two) times daily as needed for cough. (Patient not taking: Reported on 04/18/2014) 20 capsule 0    No Known Allergies  Past Medical History  Diagnosis Date  . Myocardial infarction 10/2002    - MV CAD --> Referred for CABG (no follow-up post CABG)  . 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 Lucianne Lei Trigt: LIMA-LAD, SVG-RPDA, SVG-OM1-OM2  . ST elevation myocardial infarction (STEMI) of inferolateral wall 09/09/2013    SVG-Om1-2 occluded - PCI in 2 locations; Occluded SVG-RCA & native RCA, LAD & native Cx occcluded.  EF ~20%  . Cardiomyopathy, ischemic 09/09/2013    EF ~20-25%; Large MI, existing occluded RCA & SVG-RCA; h/o CABG  . 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 CAD S/P percutaneous coronary angioplasty 09/09/2013    mid SVG-OM1-OM2 (prox LIMB) Rebel BMS 4.0 mm x 16 mm; sequential limb OM1-OM2: Rebel BMS 4.0 mm x 12 mm  . Combined systolic and diastolic congestive heart failure, NYHA class 3 09/09/2013    Echo post Inferolateral STEMI:  Severely reduced LVEF ~20-35%; Akinesis of Inferolateral, Inferior & Inferoseptal walls; Grade 1 DDysfxn (initial LVEDP ~26 mmHg with SBP of 99 mHg)  . Essential hypertension 08/17/2012  . Hyperlipidemia with target LDL less than 70 08/17/2012  . Diabetes mellitus type 2 with complications 10/24/9405  . AICD (automatic cardioverter/defibrillator) present 02/21/2014    single chamber  by dr taylor    Past Surgical History  Procedure Laterality Date  . Coronary artery bypass graft    . Left heart catheterization with coronary angiogram N/A 09/09/2013    Procedure: LEFT HEART CATHETERIZATION WITH CORONARY ANGIOGRAM;  Surgeon: Leonie Man, MD;  Location: [facility] CATH LAB;  Service: Cardiovascular;  Laterality: N/A;  . Implantable cardioverter defibrillator implant  02-21-2014    STJ single chamber ICD implanted by Dr Lovena Le for primary prevention  . Implantable cardioverter defibrillator implant N/A 02/21/2014    Procedure: IMPLANTABLE CARDIOVERTER DEFIBRILLATOR IMPLANT;  Surgeon: Evans Lance, MD;  Location: [facility] CATH LAB;  Service: Cardiovascular;  Laterality: N/A;    History   Social History  . Marital Status: Single    Spouse Name: N/A  . Number of Children: N/A  . Years of Education: N/A   Occupational History  . Not on file.   Social History Main Topics  . Smoking status: Former Research scientist (life sciences)  . Smokeless tobacco: Never Used  . Alcohol Use: No  . Drug Use: No  . Sexual Activity: Not on file   Other Topics Concern  . Not on file   Social History Narrative    Family History  Problem Relation Age of Onset  . Diabetes Mother   . Diabetes Father     ROS:  Complains of ankle pain, cough productive of white sputum but no fevers or chills, hemoptysis, dysphasia, odynophagia, melena, hematochezia, dysuria, hematuria, rash, seizure activity, claudication. Remaining systems are negative.  Physical Exam:   Blood pressure 105/91, pulse 65, temperature 97.4 F (36.3 C), temperature  source Oral, resp. rate 16, height $RemoveBe'5\' 7"'[id number]$  (1.702 m), weight 189 lb 6 oz (85.9 kg), SpO2 100 %.  General:  Well developed/well nourished in NAD Skin warm/dry Patient not depressed No peripheral clubbing Back-normal HEENT-normal/normal eyelids Neck supple/normal carotid upstroke bilaterally; no bruits; positive JVD; no thyromegaly chest - CTA/ normal expansion, ICD left chest CV - RRR/normal S1 and S2; no murmurs, rubs or gallops;  PMI nondisplaced Abdomen -NT/ND, no HSM, no mass, + bowel sounds, no bruit 2+ femoral pulses, no bruits Ext-2+ edema, no chords, diminished distal pulses Neuro-grossly nonfocal  ECG normal sinus rhythm, IVCD, prior inferior infarct, nonspecific ST changes.  Results for orders placed or performed during the hospital encounter of 04/18/14 (from the past 48 hour(s))  CBC     Status: Abnormal   Collection Time: 04/18/14  1:02 PM  Result Value Ref Range   WBC 7.6 4.0 - 10.5 K/uL   RBC 4.30 3.87 - 5.11 MIL/uL   Hemoglobin 12.2 12.0 - 15.0 g/dL   HCT 37.1  36.0 - 46.0 %   MCV 86.3 78.0 - 100.0 fL   MCH 28.4 26.0 - 34.0 pg   MCHC 32.9 30.0 - 36.0 g/dL   RDW 17.0 (H) 11.5 - 15.5 %   Platelets 135 (L) 150 - 400 K/uL  Basic metabolic panel     Status: Abnormal   Collection Time: 04/18/14  1:02 PM  Result Value Ref Range   Sodium 136 135 - 145 mmol/L   Potassium 4.8 3.5 - 5.1 mmol/L   Chloride 106 96 - 112 mmol/L   CO2 19 19 - 32 mmol/L   Glucose, Bld 426 (H) 70 - 99 mg/dL   BUN 54 (H) 6 - 23 mg/dL   Creatinine, Ser 1.98 (H) 0.50 - 1.10 mg/dL   Calcium 9.1 8.4 - 10.5 mg/dL   GFR calc non Af Amer 25 (L) >90 mL/min   GFR calc Af Amer 28 (L) >90 mL/min    Comment: (NOTE) The eGFR has been calculated using the CKD EPI equation. This calculation has not been validated in all clinical situations. eGFR's persistently <90 mL/min signify possible Chronic Kidney Disease.    Anion gap 11 5 - 15  BNP (order ONLY if patient complains of dyspnea/SOB AND you have  documented it for THIS visit)     Status: Abnormal   Collection Time: 04/18/14  1:02 PM  Result Value Ref Range   B Natriuretic Peptide >4500.0 (H) 0.0 - 100.0 pg/mL    Comment: REPEATED TO VERIFY  Protime-INR (if pt is taking Coumadin)     Status: Abnormal   Collection Time: 04/18/14  1:02 PM  Result Value Ref Range   Prothrombin Time 16.9 (H) 11.6 - 15.2 seconds   INR 1.36 0.00 - 1.49  Lipase, blood     Status: Abnormal   Collection Time: 04/18/14  1:02 PM  Result Value Ref Range   Lipase 106 (H) 11 - 59 U/L  Hepatic function panel     Status: Abnormal   Collection Time: 04/18/14  1:02 PM  Result Value Ref Range   Total Protein 6.2 6.0 - 8.3 g/dL   Albumin 3.2 (L) 3.5 - 5.2 g/dL   AST 48 (H) 0 - 37 U/L   ALT 60 (H) 0 - 35 U/L   Alkaline Phosphatase 99 39 - 117 U/L   Total Bilirubin 1.2 0.3 - 1.2 mg/dL   Bilirubin, Direct 0.4 0.0 - 0.5 mg/dL   Indirect Bilirubin 0.8 0.3 - 0.9 mg/dL  I-stat troponin, ED (not at [facility])     Status: None   Collection Time: 04/18/14  2:02 PM  Result Value Ref Range   Troponin i, poc 0.00 0.00 - 0.08 ng/mL   Comment 3            Comment: Due to the release kinetics of cTnI, a negative result within the first hours of the onset of symptoms does not rule out myocardial infarction with certainty. If myocardial infarction is still suspected, repeat the test at appropriate intervals.   CBG monitoring, ED     Status: Abnormal   Collection Time: 04/18/14  5:13 PM  Result Value Ref Range   Glucose-Capillary 366 (H) 70 - 99 mg/dL  CBG monitoring, ED     Status: Abnormal   Collection Time: 04/18/14  7:03 PM  Result Value Ref Range   Glucose-Capillary 298 (H) 70 - 99 mg/dL  I-Stat Troponin, ED (not at [facility])     Status: None   Collection Time: 04/18/14  9:47 PM  Result Value Ref Range   Troponin i, poc 0.00 0.00 - 0.08 ng/mL   Comment 3            Comment: Due to the release kinetics of cTnI, a negative result within the first hours of the onset of  symptoms does not rule out myocardial infarction with certainty. If myocardial infarction is still suspected, repeat the test at appropriate intervals.   Glucose, capillary     Status: Abnormal   Collection Time: 04/19/14 12:13 AM  Result Value Ref Range   Glucose-Capillary 143 (H) 70 - 99 mg/dL  Troponin I (q 6hr x 3)     Status: Abnormal   Collection Time: 04/19/14 12:45 AM  Result Value Ref Range   Troponin I 0.07 (H) <0.031 ng/mL    Comment:        PERSISTENTLY INCREASED TROPONIN VALUES IN THE RANGE OF 0.04-0.49 ng/mL CAN BE SEEN IN:       -UNSTABLE ANGINA       -CONGESTIVE HEART FAILURE       -MYOCARDITIS       -CHEST TRAUMA       -ARRYHTHMIAS       -LATE PRESENTING MYOCARDIAL INFARCTION       -COPD   CLINICAL FOLLOW-UP RECOMMENDED.   TSH     Status: None   Collection Time: 04/19/14 12:45 AM  Result Value Ref Range   TSH 0.942 0.350 - 4.500 uIU/mL  Glucose, capillary     Status: Abnormal   Collection Time: 04/19/14  6:02 AM  Result Value Ref Range   Glucose-Capillary 170 (H) 70 - 99 mg/dL  Troponin I (q 6hr x 3)     Status: None   Collection Time: 04/19/14  6:50 AM  Result Value Ref Range   Troponin I <0.03 <0.031 ng/mL    Comment:        NO INDICATION OF MYOCARDIAL INJURY.   Comprehensive metabolic panel     Status: Abnormal   Collection Time: 04/19/14  6:50 AM  Result Value Ref Range   Sodium 140 135 - 145 mmol/L   Potassium 4.7 3.5 - 5.1 mmol/L   Chloride 109 96 - 112 mmol/L   CO2 18 (L) 19 - 32 mmol/L   Glucose, Bld 187 (H) 70 - 99 mg/dL   BUN 61 (H) 6 - 23 mg/dL   Creatinine, Ser 2.71 (H) 0.50 - 1.10 mg/dL   Calcium 9.4 8.4 - 10.5 mg/dL   Total Protein 6.8 6.0 - 8.3 g/dL   Albumin 3.4 (L) 3.5 - 5.2 g/dL   AST 52 (H) 0 - 37 U/L   ALT 67 (H) 0 - 35 U/L   Alkaline Phosphatase 107 39 - 117 U/L   Total Bilirubin 1.8 (H) 0.3 - 1.2 mg/dL   GFR calc non Af Amer 17 (L) >90 mL/min   GFR calc Af Amer 19 (L) >90 mL/min    Comment: (NOTE) The eGFR has been  calculated using the CKD EPI equation. This calculation has not been validated in all clinical situations. eGFR's persistently <90 mL/min signify possible Chronic Kidney Disease.    Anion gap 13 5 - 15  CBC with Differential/Platelet     Status: Abnormal   Collection Time: 04/19/14  6:50 AM  Result Value Ref Range   WBC 8.6 4.0 - 10.5 K/uL   RBC 4.44 3.87 - 5.11 MIL/uL   Hemoglobin 12.6 12.0 - 15.0 g/dL   HCT 38.6 36.0 - 46.0 %  MCV 86.9 78.0 - 100.0 fL   MCH 28.4 26.0 - 34.0 pg   MCHC 32.6 30.0 - 36.0 g/dL   RDW 17.1 (H) 11.5 - 15.5 %   Platelets 132 (L) 150 - 400 K/uL   Neutrophils Relative % 59 43 - 77 %   Neutro Abs 5.1 1.7 - 7.7 K/uL   Lymphocytes Relative 32 12 - 46 %   Lymphs Abs 2.7 0.7 - 4.0 K/uL   Monocytes Relative 7 3 - 12 %   Monocytes Absolute 0.6 0.1 - 1.0 K/uL   Eosinophils Relative 1 0 - 5 %   Eosinophils Absolute 0.1 0.0 - 0.7 K/uL   Basophils Relative 0 0 - 1 %   Basophils Absolute 0.0 0.0 - 0.1 K/uL  Lipase, blood     Status: Abnormal   Collection Time: 04/19/14  6:50 AM  Result Value Ref Range   Lipase 73 (H) 11 - 59 U/L  Glucose, capillary     Status: Abnormal   Collection Time: 04/19/14 11:10 AM  Result Value Ref Range   Glucose-Capillary 192 (H) 70 - 99 mg/dL   Comment 1 Notify RN   Troponin I (q 6hr x 3)     Status: None   Collection Time: 04/19/14 12:53 PM  Result Value Ref Range   Troponin I <0.03 <0.031 ng/mL    Comment:        NO INDICATION OF MYOCARDIAL INJURY.   Glucose, capillary     Status: Abnormal   Collection Time: 04/19/14  5:02 PM  Result Value Ref Range   Glucose-Capillary 201 (H) 70 - 99 mg/dL    Dg Chest 2 View  04/18/2014   CLINICAL DATA:  Cough for 1 week.  EXAM: CHEST  2 VIEW  COMPARISON:  02/22/2014  FINDINGS: Left single lead pacer tip is in the right ventricle, unchanged. Prior CABG. Mild cardiomegaly. No confluent opacities or effusions. No acute bony abnormality. Degenerative changes in the thoracic spine.   IMPRESSION: Cardiomegaly.  No active disease.   Electronically Signed   By: Rolm Baptise M.D.   On: 04/18/2014 17:46   Dg Ankle 2 Views Right  04/19/2014   CLINICAL DATA:  Subacute right ankle pain and swelling. Initial encounter.  EXAM: RIGHT ANKLE - 2 VIEW  COMPARISON:  03/25/2014  FINDINGS: Soft tissue swelling is noted.  There is no evidence of acute fracture, subluxation or dislocation.  A small calcaneal spur is present.  No focal bony lesions are present.  IMPRESSION: Soft tissue swelling without acute bony abnormality.   Electronically Signed   By: Margarette Canada M.D.   On: 04/19/2014 15:07   Nm Hepatobiliary Liver Func  04/19/2014   CLINICAL DATA:  Abdominal pain with nausea and vomiting.  EXAM: NUCLEAR MEDICINE HEPATOBILIARY IMAGING WITH GALLBLADDER EF  TECHNIQUE: Sequential images of the abdomen were obtained out to 60 minutes following intravenous administration of radiopharmaceutical. After slow intravenous infusion of 1.7 micrograms Cholecystokinin, gallbladder ejection fraction was determined.  RADIOPHARMACEUTICALS:  5 [place] [id number] Choletec  COMPARISON:  02/16/2014 hepatobiliary scan. 04/18/2014 abdominal ultrasound.  FINDINGS: There is prompt radiotracer uptake by the liver with progressive excretion into the biliary system. The gallbladder and small bowel activity are both identified at around 20-25 minutes. After administration of CCK, there was only mild expulsion of radiotracer from the gallbladder, with a calculated ejection fraction of 26% at 45 minutes (66% ejection fraction at 30 minutes on the prior study). At 45 min, normal ejection fraction is greater  than 40%.  The patient did not experience symptoms during CCK infusion.  IMPRESSION: 1. Patent cystic duct and common bile duct. No evidence of acute cholecystitis. 2. Diminished gallbladder ejection fraction, nonspecific but may reflect chronic cholecystitis/ biliary dyskinesia.   Electronically Signed   By: Logan Bores   On:  04/19/2014 17:00   US Abdomen Complete  04/18/2014   CLINICAL DATA:  Abdominal pain and vomiting.  EXAM: ULTRASOUND ABDOMEN COMPLETE  COMPARISON:  Prior abdominal ultrasound on 02/16/2014  FINDINGS: Gallbladder: Stable appearance of cholelithiasis. The gallbladder is not distended. Stable top-normal wall thickness. No sonographic Murphy's sign.  Common bile duct: Diameter: 7 mm in maximum measured diameter. No visualized calculi in the common bile duct by ultrasound.  Liver: No focal lesion identified. Within normal limits in parenchymal echogenicity. No evidence of intrahepatic biliary ductal dilatation.  IVC: No abnormality visualized.  Pancreas: The pancreas is not visualized by ultrasound.  Spleen: Size and appearance within normal limits.  Right Kidney: Length: 12.3 cm. No hydronephrosis. Hyperechoic focus in the lower pole may represent a nonobstructing calculus.  Left Kidney: Length: 11.0 cm. No hydronephrosis. Stable simple cyst of the upper pole measures 6.3 cm in greatest diameter.  Abdominal aorta: No aneurysm visualized.  Other findings: None.  IMPRESSION: Stable appearance of cholelithiasis. Mildly prominent gallbladder wall thickness is similar to the prior study and may relate to under distention. No overt signs of cholecystitis or biliary obstruction.   Electronically Signed   By: Aletta Edouard M.D.   On: 04/18/2014 21:05   Dg Abd 2 Views  04/18/2014   CLINICAL DATA:  Vomiting for 1 week.  Epigastric pain for 1 week.  EXAM: ABDOMEN - 2 VIEW  COMPARISON:  None.  FINDINGS: Nonobstructive bowel gas pattern.  No free air organomegaly.  A mortise calcifications noted within the Mid and left pelvis. These are of unknown etiology. These appear too diffuse to represent calcified fibroids in may be peritoneal. May consider abdomen and pelvic CT for further evaluation if felt clinically indicated.  IMPRESSION: Extensive calcifications in the pelvis of unknown etiology, which appear too diffuse to  represent calcified fibroids. Question peritoneal calcifications. These could be further evaluated with CT if felt clinically indicated.  No evidence of bowel obstruction or free air.   Electronically Signed   By: Rolm Baptise M.D.   On: 04/18/2014 17:50    Assessment/Plan 1 acute on chronic systolic congestive heart failure-patient presented with cough, mild increased dyspnea and bilateral lower extremity edema. BNP was elevated. Diuresis initiated but renal function has deteriorated. She remains volume overloaded. We'll plan to hold beta blockade for now. Her ARB has been discontinued. Add low-dose hydralazine/nitrates. Resume diuresis. She may require short course of milrinone if renal function continues to deteriorate. We will resume beta-blockade once acute congestive heart failure improves. Repeat echo. 2 chronic stage III renal failure-renal function has deteriorated. Follow renal function closely with diuresis. 3 status post ICD 4 coronary artery disease-continue aspirin and Plavix. Add statin. 5 right ankle pain-further evaluation per primary care.  Kirk Ruths MD 04/19/2014, 5:29 PM

## 2014-04-19 NOTE — Progress Notes (Addendum)
Pt HR in 50s since admit. 25mg  Toprol-XL due tonight. K Schorr paged via amion textpage. 00:58  Order to hold Toprol-XL tonight. 1:05 AM  Ronnette Hila, RN

## 2014-04-19 NOTE — Progress Notes (Signed)
Pt troponin 0.07. Pt with no complaints of chest pain, vital signs stable. K Schorr notified, ordered to page Dr. Hal Hope. MD paged. Per MD, to start heparin gtt

## 2014-04-19 NOTE — Progress Notes (Signed)
PROGRESS NOTE  Jo Lynn QBH:[id number] DOB: Jan 20, 1946 DOA: 04/18/2014 PCP: Jo Pretty, FNP  HPI/Subjective: Jo Lynn is a 69 yo black female with a history of ischemic cardiomyopathy, STEMI, hypertension, and diabetes mellitus type 2. She is s/p ICD (12/15) and CABG (2004). Pt presents with increased lower extremity edema, orthopnea, and dyspnea on exertion for the past 1 week. Progressively worsened white productive cough since December when she was admitted and treated for pneumonia. Pt states she also has infrequent posttussive emesis, but denies seeing any blood. She states that she takes her medications as prescribed. Increased right ankle pain for which she saw her cardiologist completed an ABI and showed no ischemia.   Pt still complains of lower leg edema, orthopnea, dyspnea on exertion and productive. Denies current N/V. Denies chest pain, wheezing, abdominal pain. States she drinks 48oz of diet coke each day at home along with water and juice.    Assessment/Plan:  Acute on Chronic Systolic and Diastolic Heart Failure -Worsened orthopnea, dyspnea on exertion and lower extremity edema  -Echo on 12/28/2013 showed a dilated  LV and ejection fraction of 25% with grade 2 diastolic dysfunction -CXR on 2/22 shows cardiomegaly with no active disease -Continue Lasix 60mg  twice daily; watch creatinine  -Continue ASA 81mg  daily with Plavix 75mg  daily  -Continue Toprol XR 25mg  twice daily -Fluid restriction education given, should restrict to 1276mL -Consult Cardiology  Posttussive emesis -Occasional posttussive emesis, denies blood -Abdominal Xray on 2/22 shows diffuse calcifications in the pelvis -Abdominal US on 2/22 shows stable appearance of cholelithiasis  -Tussionex 101mL Q12hours PRN for cough   Diabetes mellitus, type 2, uncontrolled -Current CBG is 187 and decreased significantly from admission -Continue Linagliptin 5mg  daily and Novolog three times  daily -Monitor CBG  Hypertension  -Latest blood pressure is 112/68 -Toprol XR 25mg  twice daily  Chronic Kidney Disease III -Current creatinine is 2.71 and GFR is 19 (GFR previously was 38 in 01/2014) -Hold Lasix until seen by Cardiology  Elevated LFT and lipase -AST 52; ALT 67; Lipase 73; this is a slight change from yesterday's results of AST 48; ALT 60; Lipase 106 -HIDA scan today; NPO until scan complete -Could be due to congestion hepatitis   Right ankle pain -Complaint of increased right ankle pain; saw cardiologist and ABI was negative (03/11/2014) -Right ankle Xray from 1/29 showed no acute or significant osseous abnormality and small ankle joint effusion. -Right ankle xray pending     DVT Prophylaxis:  Plavix  Code Status: Full Family Communication: None  Disposition Plan: Inpatient    Consultants:  None  Procedures:  None  Antibiotics:  None  Objective: Filed Vitals:   04/18/14 2303 04/19/14 0057 04/19/14 0208 04/19/14 0620  BP: 105/70  124/72 112/68  Pulse: 57 55 59 62  Temp: 97.9 F (36.6 C)  97.6 F (36.4 C) 97.4 F (36.3 C)  TempSrc: Oral  Oral Oral  Resp: 17  18 17   Height: 5\' 7"  (1.702 m)     Weight: 85.957 kg (189 lb 8 oz)   85.9 kg (189 lb 6 oz)  SpO2: 100%  100% 97%    Intake/Output Summary (Last 24 hours) at 04/19/14 1001 Last data filed at 04/19/14 0912  Gross per 24 hour  Intake    838 ml  Output    400 ml  Net    438 ml   Filed Weights   04/18/14 2207 04/18/14 2303 04/19/14 0620  Weight: 85.957 kg (189 lb 8 oz) 85.957 kg (  189 lb 8 oz) 85.9 kg (189 lb 6 oz)    Exam: General: Well developed, well nourished, NAD, appears stated age  55:  PERR, EOMI, Anicteic Sclera, MMM. No pharyngeal erythema or exudates  Neck: Supple, no JVD, no masses  Cardiovascular: RRR, S1 S2 auscultated, no rubs, murmurs or gallops.   Respiratory: Clear to auscultation bilaterally with equal chest rise  Abdomen: Soft, nontender, nondistended, +  bowel sounds. No tenderness was elicited.  Extremities: warm dry without cyanosis clubbing. 2+ pitting lower extremity edema  Neuro: AAOx3, cranial nerves grossly intact. Strength 5/5 in upper and lower extremities  Skin: Without rashes exudates or nodules.   Psych: Normal affect and demeanor with intact judgement and insight   Data Reviewed: Basic Metabolic Panel:  Recent Labs Lab 04/18/14 1302 04/19/14 0650  NA 136 140  K 4.8 4.7  CL 106 109  CO2 19 18*  GLUCOSE 426* 187*  BUN 54* 61*  CREATININE 1.98* 2.71*  CALCIUM 9.1 9.4   Liver Function Tests:  Recent Labs Lab 04/18/14 1302 04/19/14 0650  AST 48* 52*  ALT 60* 67*  ALKPHOS 99 107  BILITOT 1.2 1.8*  PROT 6.2 6.8  ALBUMIN 3.2* 3.4*    Recent Labs Lab 04/18/14 1302 04/19/14 0650  LIPASE 106* 73*   No results for input(s): AMMONIA in the last 168 hours. CBC:  Recent Labs Lab 04/18/14 1302 04/19/14 0650  WBC 7.6 8.6  NEUTROABS  --  5.1  HGB 12.2 12.6  HCT 37.1 38.6  MCV 86.3 86.9  PLT 135* 132*   Cardiac Enzymes:  Recent Labs Lab 04/19/14 0045 04/19/14 0650  TROPONINI 0.07* <0.03   BNP (last 3 results)  Recent Labs  02/15/14 2147 02/19/14 1303 04/18/14 1302  BNP 4826.4* 4653.0* >4500.0*    ProBNP (last 3 results)  Recent Labs  09/09/13 1055 09/11/13 0850 09/13/13 0850  PROBNP 2561.0* 9429.0* 2838.0*    CBG:  Recent Labs Lab 04/18/14 1713 04/18/14 1903 04/19/14 0013 04/19/14 0602  GLUCAP 366* 298* 143* 170*    No results found for this or any previous visit (from the past 240 hour(s)).   Studies: Dg Chest 2 View  04/18/2014   CLINICAL DATA:  Cough for 1 week.  EXAM: CHEST  2 VIEW  COMPARISON:  02/22/2014  FINDINGS: Left single lead pacer tip is in the right ventricle, unchanged. Prior CABG. Mild cardiomegaly. No confluent opacities or effusions. No acute bony abnormality. Degenerative changes in the thoracic spine.  IMPRESSION: Cardiomegaly.  No active disease.    Electronically Signed   By: Rolm Baptise M.D.   On: 04/18/2014 17:46   US Abdomen Complete  04/18/2014   CLINICAL DATA:  Abdominal pain and vomiting.  EXAM: ULTRASOUND ABDOMEN COMPLETE  COMPARISON:  Prior abdominal ultrasound on 02/16/2014  FINDINGS: Gallbladder: Stable appearance of cholelithiasis. The gallbladder is not distended. Stable top-normal wall thickness. No sonographic Murphy's sign.  Common bile duct: Diameter: 7 mm in maximum measured diameter. No visualized calculi in the common bile duct by ultrasound.  Liver: No focal lesion identified. Within normal limits in parenchymal echogenicity. No evidence of intrahepatic biliary ductal dilatation.  IVC: No abnormality visualized.  Pancreas: The pancreas is not visualized by ultrasound.  Spleen: Size and appearance within normal limits.  Right Kidney: Length: 12.3 cm. No hydronephrosis. Hyperechoic focus in the lower pole may represent a nonobstructing calculus.  Left Kidney: Length: 11.0 cm. No hydronephrosis. Stable simple cyst of the upper pole measures 6.3 cm in greatest  diameter.  Abdominal aorta: No aneurysm visualized.  Other findings: None.  IMPRESSION: Stable appearance of cholelithiasis. Mildly prominent gallbladder wall thickness is similar to the prior study and may relate to under distention. No overt signs of cholecystitis or biliary obstruction.   Electronically Signed   By: Aletta Edouard M.D.   On: 04/18/2014 21:05   Dg Abd 2 Views  04/18/2014   CLINICAL DATA:  Vomiting for 1 week.  Epigastric pain for 1 week.  EXAM: ABDOMEN - 2 VIEW  COMPARISON:  None.  FINDINGS: Nonobstructive bowel gas pattern.  No free air organomegaly.  A mortise calcifications noted within the Mid and left pelvis. These are of unknown etiology. These appear too diffuse to represent calcified fibroids in may be peritoneal. May consider abdomen and pelvic CT for further evaluation if felt clinically indicated.  IMPRESSION: Extensive calcifications in the pelvis of  unknown etiology, which appear too diffuse to represent calcified fibroids. Question peritoneal calcifications. These could be further evaluated with CT if felt clinically indicated.  No evidence of bowel obstruction or free air.   Electronically Signed   By: Rolm Baptise M.D.   On: 04/18/2014 17:50    Scheduled Meds: . aspirin EC  81 mg Oral Daily  . clopidogrel  75 mg Oral Daily  . furosemide  60 mg Intravenous BID  . heparin subcutaneous  5,000 Units Subcutaneous 3 times per day  . insulin aspart  0-9 Units Subcutaneous TID WC  . linagliptin  5 mg Oral Daily  . metoprolol succinate  25 mg Oral BID  . sodium chloride  3 mL Intravenous Q12H   Continuous Infusions:   Active Problems:   Essential hypertension   Acute on chronic combined systolic and diastolic CHF (congestive heart failure) - in setting of recent Large Inferolateral STEMI with existing RCA/SVG-RCA occlusion   Cardiomyopathy, ischemic - EF ~20-25%; Large MI, existing occluded RCA & SVG-RCA; h/o CABG   CHF (congestive heart failure)   CKD (chronic kidney disease) stage 3, GFR 30-59 ml/min   Diabetes mellitus type 2, uncontrolled   Nausea with vomiting    Foye Spurling, PA-S [facility] 04/19/2014, 10:01 AM   Addendum  Patient is a pleasant 69 year old female with a past medical history of ischemic cardiomyopathy having an ejection fraction of 20-25% based on transthoracic echocardiogram was performed on 12/28/2013. He also has AICD placed on 02/21/2014. She was admitted to the medicine service on 04/19/2014 presenting with complaints of increasing bilateral lower extremity edema. Labs revealed elevated BNP greater than 4500. After initiating IV lasix 60 mg BID her Creatinine increased to 2.71 from 1.98. Looking back at records her baseline creatinine near 1.8-2.0. Cardiology has been consulted. Will hold lasix until seen by cardiology.

## 2014-04-19 NOTE — Progress Notes (Signed)
ANTICOAGULATION CONSULT NOTE - Initial Consult  Pharmacy Consult for Heparin  Indication: chest pain/ACS  No Known Allergies  Patient Measurements: Height: 5\' 7"  (170.2 cm) Weight: 189 lb 8 oz (85.957 kg) IBW/kg (Calculated) : 61.6  Vital Signs: Temp: 97.6 F (36.4 C) (02/23 0208) Temp Source: Oral (02/23 0208) BP: 124/72 mmHg (02/23 0208) Pulse Rate: 59 (02/23 0208)  Labs:  Recent Labs  04/18/14 1302 04/19/14 0045  HGB 12.2  --   HCT 37.1  --   PLT 135*  --   LABPROT 16.9*  --   INR 1.36  --   CREATININE 1.98*  --   TROPONINI  --  0.07*    Estimated Creatinine Clearance: 30.2 mL/min (by C-G formula based on Cr of 1.98).   Medical History: Past Medical History  Diagnosis Date  . Myocardial infarction 10/2002    - MV CAD --> Referred for CABG (no follow-up post CABG)  . 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 Lucianne Lei Trigt: LIMA-LAD, SVG-RPDA, SVG-OM1-OM2  . ST elevation myocardial infarction (STEMI) of inferolateral wall 09/09/2013    SVG-Om1-2 occluded - PCI in 2 locations; Occluded SVG-RCA & native RCA, LAD & native Cx occcluded.  EF ~20%  . Cardiomyopathy, ischemic 09/09/2013    EF ~20-25%; Large MI, existing occluded RCA & SVG-RCA; h/o CABG  . 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 CAD S/P percutaneous coronary angioplasty 09/09/2013    mid SVG-OM1-OM2 (prox LIMB) Rebel BMS 4.0 mm x 16 mm; sequential limb OM1-OM2: Rebel BMS 4.0 mm x 12 mm  . Combined systolic and diastolic congestive heart failure, NYHA class 3 09/09/2013    Echo post Inferolateral STEMI: Severely reduced LVEF ~20-35%; Akinesis of  Inferolateral, Inferior & Inferoseptal walls; Grade 1 DDysfxn (initial LVEDP ~26 mmHg with SBP of 99 mHg)  . Essential hypertension 08/17/2012  . Hyperlipidemia with target LDL less than 70 08/17/2012  . Diabetes mellitus type 2 with complications 7/34/1937  . AICD (automatic cardioverter/defibrillator) present 02/21/2014    single chamber  by dr taylor   Assessment: Heparin for mildly elevated troponin. CBC good, renal dysfunction noted. No AC PTA.   Goal of Therapy:  Heparin level 0.3-0.7 units/ml Monitor platelets by anticoagulation protocol: Yes   Plan:  -Heparin 4000 units BOLUS -Start heparin drip at 900 units/hr -1100 HL -Daily CBC/HL -Monitor for bleeding  Narda Bonds 04/19/2014,3:04 AM

## 2014-04-20 DIAGNOSIS — M25579 Pain in unspecified ankle and joints of unspecified foot: Secondary | ICD-10-CM

## 2014-04-20 DIAGNOSIS — R109 Unspecified abdominal pain: Secondary | ICD-10-CM

## 2014-04-20 DIAGNOSIS — R112 Nausea with vomiting, unspecified: Secondary | ICD-10-CM

## 2014-04-20 DIAGNOSIS — I5023 Acute on chronic systolic (congestive) heart failure: Secondary | ICD-10-CM | POA: Diagnosis present

## 2014-04-20 DIAGNOSIS — I509 Heart failure, unspecified: Secondary | ICD-10-CM

## 2014-04-20 DIAGNOSIS — R05 Cough: Secondary | ICD-10-CM

## 2014-04-20 LAB — BASIC METABOLIC PANEL
ANION GAP: 15 (ref 5–15)
BUN: 67 mg/dL — ABNORMAL HIGH (ref 6–23)
CO2: 15 mmol/L — ABNORMAL LOW (ref 19–32)
Calcium: 9.4 mg/dL (ref 8.4–10.5)
Chloride: 109 mmol/L (ref 96–112)
Creatinine, Ser: 2.65 mg/dL — ABNORMAL HIGH (ref 0.50–1.10)
GFR calc Af Amer: 20 mL/min — ABNORMAL LOW (ref >90)
GFR calc non Af Amer: 17 mL/min — ABNORMAL LOW (ref >90)
Glucose, Bld: 175 mg/dL — ABNORMAL HIGH (ref 70–99)
POTASSIUM: 4.6 mmol/L (ref 3.5–5.1)
SODIUM: 139 mmol/L (ref 135–145)

## 2014-04-20 LAB — HEMOGLOBIN A1C
HEMOGLOBIN A1C: 5.8 % — AB (ref 4.8–5.6)
Mean Plasma Glucose: 120 mg/dL

## 2014-04-20 LAB — GLUCOSE, CAPILLARY
GLUCOSE-CAPILLARY: 170 mg/dL — AB (ref 70–99)
GLUCOSE-CAPILLARY: 236 mg/dL — AB (ref 70–99)
GLUCOSE-CAPILLARY: 480 mg/dL — AB (ref 70–99)
Glucose-Capillary: 328 mg/dL — ABNORMAL HIGH (ref 70–99)

## 2014-04-20 LAB — URIC ACID: Uric Acid, Serum: 14.5 mg/dL — ABNORMAL HIGH (ref 2.4–7.0)

## 2014-04-20 MED ORDER — INSULIN ASPART 100 UNIT/ML ~~LOC~~ SOLN
10.0000 [IU] | Freq: Once | SUBCUTANEOUS | Status: AC
  Administered 2014-04-20: 10 [IU] via SUBCUTANEOUS

## 2014-04-20 MED ORDER — COLCHICINE 0.6 MG PO TABS
0.6000 mg | ORAL_TABLET | Freq: Two times a day (BID) | ORAL | Status: DC
  Administered 2014-04-20 – 2014-04-22 (×5): 0.6 mg via ORAL
  Filled 2014-04-20 (×6): qty 1

## 2014-04-20 MED ORDER — FUROSEMIDE 10 MG/ML IJ SOLN: 40.0000 mg | Freq: Three times a day (TID) | INTRAMUSCULAR | Status: DC

## 2014-04-20 MED ORDER — METHYLPREDNISOLONE SODIUM SUCC 125 MG IJ SOLR
60.0000 mg | Freq: Once | INTRAMUSCULAR | Status: AC
  Administered 2014-04-20: 60 mg via INTRAVENOUS
  Filled 2014-04-20: qty 2

## 2014-04-20 MED ORDER — FUROSEMIDE 10 MG/ML IJ SOLN
40.0000 mg | Freq: Two times a day (BID) | INTRAMUSCULAR | Status: DC
  Administered 2014-04-20: 40 mg via INTRAVENOUS
  Filled 2014-04-20 (×2): qty 4

## 2014-04-20 NOTE — Progress Notes (Signed)
PROGRESS NOTE  Jo Lynn ZOX:[id number] DOB: 1945/05/18 DOA: 04/18/2014 PCP: Chevis Pretty, FNP  Summary : Jo Lynn is a 69 yo black female with a history of ischemic cardiomyopathy, STEMI, hypertension, and diabetes mellitus type 2. She is s/p ICD (12/15) and CABG (2004). Pt presents with increased lower extremity edema, orthopnea, and dyspnea on exertion for the past 1 week. Progressively worsened white productive cough since December when she was admitted and treated for pneumonia. Pt states she also has infrequent posttussive emesis, but denies seeing any blood. She states that she takes her medications as prescribed. Increased right ankle pain for which she saw her cardiologist completed an ABI and showed no ischemia.   Pt still complains of lower leg edema, orthopnea, dyspnea on exertion and productive. Denies current N/V. Denies chest pain, wheezing, abdominal pain. States she drinks 48oz of diet coke each day at home along with water and juice.   Subjective. In bed, no headache, no chest pain, still has some orthopnea and shortness of breath, leg edema getting better. Right ankle pain positive.   Assessment/Plan:  Acute on Chronic Systolic and Diastolic Heart Failure -Worsened orthopnea, dyspnea on exertion and lower extremity edema  -Echo on 12/28/2013 showed a dilated  LV and ejection fraction of 25% with grade 2 diastolic dysfunction -CXR on 2/22 shows cardiomegaly with no active disease -Continue Lasix to 2 mg IV twice a day, monitor BMP cardiology following blood pressure too soft for beta blocker currently. Hold hydralazine as blood pressure low. -Continue ASA 81mg  daily with Plavix 75mg  daily  -Continue Toprol XR 25mg  twice daily if blood pressure able to tolerate -Fluid restriction education given, should restrict to 1223mL    Posttussive emesis -Occasional posttussive emesis, denies blood -Abdominal Xray on 2/22 shows diffuse calcifications in the  pelvis -Abdominal US on 2/22 shows stable appearance of cholelithiasis , HIDA unremarkable outpatient follow-up with Gen. surgery for gallstones. -Tussionex 9mL Q12hours PRN for cough   Diabetes mellitus, type 2, uncontrolled -Current CBG is 187 and decreased significantly from admission -Continue Linagliptin 5mg  daily and Novolog three times daily -Monitor CBG  CBG (last 3)   Recent Labs  04/19/14 2101 04/20/14 0558 04/20/14 1118  GLUCAP 195* 170* 236*      Hypertension  -Pressure soft hydralazine and beta blocker held, monitor. Lasix Imdur continue.   ARF on Chronic Kidney Disease III -Her baseline creatinine appears to be close to 2, diuretics per cardiology, avoid ACE/ARB and nephrotoxins. Monitor creatinine. Still has evidence of fluid overload.  Elevated LFT and lipase -AST 52; ALT 67; Lipase 73; this is a slight change from yesterday's results of AST 48; ALT 60; Lipase 106 -From heart failure and hepatic congestion. HIDA scan unremarkable.   Right ankle pain -Complaint of increased right ankle pain; saw cardiologist and ABI was negative (03/11/2014) -Because it elevated, x-ray shows mild soft tissue edema this is consistent with acute gout. We'll give her IV Solu-Medrol one dose and place on coach is seen.    DVT Prophylaxis:  Plavix  Code Status: Full Family Communication: None  Disposition Plan: Inpatient    Consultants:  Cards  Procedures:   TTE 2015  Mildly dilated LV with EF 25%. Wall motion abnormalities as noted above. Moderate diastolic dysfunction. Normal RV size with mild to moderate systolic dysfunction. Moderate pulmonaryhypertension. There was moderate mitral regurgitation, likely infarct-related.    Antibiotics:  None  Objective: Filed Vitals:   04/19/14 2039 04/20/14 0251 04/20/14 0534 04/20/14 0945  BP: 131/74  107/61 124/78 100/56  Pulse: 61 65 61 57  Temp: 97.6 F (36.4 C) 98.1 F (36.7 C)    TempSrc: Oral Oral    Resp:   18    Height:      Weight:   84.7 kg (186 lb 11.7 oz)   SpO2: 100% 100% 100% 100%    Intake/Output Summary (Last 24 hours) at 04/20/14 1223 Last data filed at 04/20/14 2947  Gross per 24 hour  Intake    660 ml  Output   2200 ml  Net  -1540 ml   Filed Weights   04/18/14 2303 04/19/14 0620 04/20/14 0534  Weight: 85.957 kg (189 lb 8 oz) 85.9 kg (189 lb 6 oz) 84.7 kg (186 lb 11.7 oz)    Exam: General: Well developed, well nourished, NAD, appears stated age  34:  PERR, EOMI, Anicteic Sclera, MMM. No pharyngeal erythema or exudates  Neck: Supple, no JVD, no masses  Cardiovascular: RRR, S1 S2 auscultated, no rubs, murmurs or gallops.   Respiratory: Clear to auscultation bilaterally with equal chest rise  Abdomen: Soft, nontender, nondistended, + bowel sounds. No tenderness was elicited.  Extremities: warm dry without cyanosis clubbing. 2+ pitting lower extremity edema  Neuro: AAOx3, cranial nerves grossly intact. Strength 5/5 in upper and lower extremities  Skin: Without rashes exudates or nodules.   Psych: Normal affect and demeanor with intact judgement and insight   Data Reviewed: Basic Metabolic Panel:  Recent Labs Lab 04/18/14 1302 04/19/14 0650 04/20/14 0520  NA 136 140 139  K 4.8 4.7 4.6  CL 106 109 109  CO2 19 18* 15*  GLUCOSE 426* 187* 175*  BUN 54* 61* 67*  CREATININE 1.98* 2.71* 2.65*  CALCIUM 9.1 9.4 9.4   Liver Function Tests:  Recent Labs Lab 04/18/14 1302 04/19/14 0650  AST 48* 52*  ALT 60* 67*  ALKPHOS 99 107  BILITOT 1.2 1.8*  PROT 6.2 6.8  ALBUMIN 3.2* 3.4*    Recent Labs Lab 04/18/14 1302 04/19/14 0650  LIPASE 106* 73*   No results for input(s): AMMONIA in the last 168 hours. CBC:  Recent Labs Lab 04/18/14 1302 04/19/14 0650  WBC 7.6 8.6  NEUTROABS  --  5.1  HGB 12.2 12.6  HCT 37.1 38.6  MCV 86.3 86.9  PLT 135* 132*   Cardiac Enzymes:  Recent Labs Lab 04/19/14 0045 04/19/14 0650 04/19/14 1253  TROPONINI 0.07*  <0.03 <0.03   BNP (last 3 results)  Recent Labs  02/15/14 2147 02/19/14 1303 04/18/14 1302  BNP 4826.4* 4653.0* >4500.0*    ProBNP (last 3 results)  Recent Labs  09/09/13 1055 09/11/13 0850 09/13/13 0850  PROBNP 2561.0* 9429.0* 2838.0*    CBG:  Recent Labs Lab 04/19/14 1110 04/19/14 1702 04/19/14 2101 04/20/14 0558 04/20/14 1118  GLUCAP 192* 201* 195* 170* 236*    No results found for this or any previous visit (from the past 240 hour(s)).   Studies: Dg Chest 2 View  04/18/2014   CLINICAL DATA:  Cough for 1 week.  EXAM: CHEST  2 VIEW  COMPARISON:  02/22/2014  FINDINGS: Left single lead pacer tip is in the right ventricle, unchanged. Prior CABG. Mild cardiomegaly. No confluent opacities or effusions. No acute bony abnormality. Degenerative changes in the thoracic spine.  IMPRESSION: Cardiomegaly.  No active disease.   Electronically Signed   By: Rolm Baptise M.D.   On: 04/18/2014 17:46   Dg Ankle 2 Views Right  04/19/2014   CLINICAL DATA:  Subacute right  ankle pain and swelling. Initial encounter.  EXAM: RIGHT ANKLE - 2 VIEW  COMPARISON:  03/25/2014  FINDINGS: Soft tissue swelling is noted.  There is no evidence of acute fracture, subluxation or dislocation.  A small calcaneal spur is present.  No focal bony lesions are present.  IMPRESSION: Soft tissue swelling without acute bony abnormality.   Electronically Signed   By: Margarette Canada M.D.   On: 04/19/2014 15:07   Nm Hepatobiliary Liver Func  04/19/2014   CLINICAL DATA:  Abdominal pain with nausea and vomiting.  EXAM: NUCLEAR MEDICINE HEPATOBILIARY IMAGING WITH GALLBLADDER EF  TECHNIQUE: Sequential images of the abdomen were obtained out to 60 minutes following intravenous administration of radiopharmaceutical. After slow intravenous infusion of 1.7 micrograms Cholecystokinin, gallbladder ejection fraction was determined.  RADIOPHARMACEUTICALS:  5 [employer] YB-01B Choletec  COMPARISON:  02/16/2014 hepatobiliary scan.  04/18/2014 abdominal ultrasound.  FINDINGS: There is prompt radiotracer uptake by the liver with progressive excretion into the biliary system. The gallbladder and small bowel activity are both identified at around 20-25 minutes. After administration of CCK, there was only mild expulsion of radiotracer from the gallbladder, with a calculated ejection fraction of 26% at 45 minutes (66% ejection fraction at 30 minutes on the prior study). At 45 min, normal ejection fraction is greater than 40%.  The patient did not experience symptoms during CCK infusion.  IMPRESSION: 1. Patent cystic duct and common bile duct. No evidence of acute cholecystitis. 2. Diminished gallbladder ejection fraction, nonspecific but may reflect chronic cholecystitis/ biliary dyskinesia.   Electronically Signed   By: Logan Bores   On: 04/19/2014 17:00   US Abdomen Complete  04/18/2014   CLINICAL DATA:  Abdominal pain and vomiting.  EXAM: ULTRASOUND ABDOMEN COMPLETE  COMPARISON:  Prior abdominal ultrasound on 02/16/2014  FINDINGS: Gallbladder: Stable appearance of cholelithiasis. The gallbladder is not distended. Stable top-normal wall thickness. No sonographic Murphy's sign.  Common bile duct: Diameter: 7 mm in maximum measured diameter. No visualized calculi in the common bile duct by ultrasound.  Liver: No focal lesion identified. Within normal limits in parenchymal echogenicity. No evidence of intrahepatic biliary ductal dilatation.  IVC: No abnormality visualized.  Pancreas: The pancreas is not visualized by ultrasound.  Spleen: Size and appearance within normal limits.  Right Kidney: Length: 12.3 cm. No hydronephrosis. Hyperechoic focus in the lower pole may represent a nonobstructing calculus.  Left Kidney: Length: 11.0 cm. No hydronephrosis. Stable simple cyst of the upper pole measures 6.3 cm in greatest diameter.  Abdominal aorta: No aneurysm visualized.  Other findings: None.  IMPRESSION: Stable appearance of cholelithiasis. Mildly  prominent gallbladder wall thickness is similar to the prior study and may relate to under distention. No overt signs of cholecystitis or biliary obstruction.   Electronically Signed   By: Aletta Edouard M.D.   On: 04/18/2014 21:05   Dg Abd 2 Views  04/18/2014   CLINICAL DATA:  Vomiting for 1 week.  Epigastric pain for 1 week.  EXAM: ABDOMEN - 2 VIEW  COMPARISON:  None.  FINDINGS: Nonobstructive bowel gas pattern.  No free air organomegaly.  A mortise calcifications noted within the Mid and left pelvis. These are of unknown etiology. These appear too diffuse to represent calcified fibroids in may be peritoneal. May consider abdomen and pelvic CT for further evaluation if felt clinically indicated.  IMPRESSION: Extensive calcifications in the pelvis of unknown etiology, which appear too diffuse to represent calcified fibroids. Question peritoneal calcifications. These could be further evaluated with CT if  felt clinically indicated.  No evidence of bowel obstruction or free air.   Electronically Signed   By: Rolm Baptise M.D.   On: 04/18/2014 17:50    Scheduled Meds: . aspirin EC  81 mg Oral Daily  . atorvastatin  20 mg Oral q1800  . clopidogrel  75 mg Oral Daily  . colchicine  0.6 mg Oral BID  . furosemide  40 mg Intravenous BID  . heparin subcutaneous  5,000 Units Subcutaneous 3 times per day  . hydrALAZINE  10 mg Oral 3 times per day  . insulin aspart  0-9 Units Subcutaneous TID WC  . isosorbide mononitrate  30 mg Oral Daily  . linagliptin  5 mg Oral Daily  . methylPREDNISolone (SOLU-MEDROL) injection  60 mg Intravenous Once  . sincalide  0.02 mcg/kg Intravenous Once  . sodium chloride  3 mL Intravenous Q12H   Continuous Infusions:   Active Problems:   Essential hypertension   Cardiomyopathy, ischemic - EF ~20-25%; Large MI, existing occluded RCA & SVG-RCA; h/o CABG   S/P ICD (internal cardiac defibrillator) procedure   CKD (chronic kidney disease) stage 3, GFR 30-59 ml/min   Diabetes  mellitus type 2, uncontrolled   Nausea with vomiting   Acute on chronic systolic CHF (congestive heart failure)    Lala Lund K M.D on 04/20/2014 at 12:23 PM  Between 7am to 7pm - Pager - [phone], After 7pm go to [email or web address] - password [email or web address]  Office  [phone]

## 2014-04-20 NOTE — Progress Notes (Signed)
SUBJECTIVE: The patient is improving in the hospital. She is diuresing. She still has PND and peripheral edema. Renal function is very slightly better today than yesterday. In speaking with the patient today, she has a poor understanding of the need to restrict both her salt and fluid intake. She drinks extra fluid during the day thinking that this has been recommended for her. I explained to her the importance of learning to watch her salt intake and to limiting her overall fluid intake.   Filed Vitals:   04/19/14 2039 04/20/14 0251 04/20/14 0534 04/20/14 0945  BP: 131/74 107/61 124/78 100/56  Pulse: 61 65 61 57  Temp: 97.6 F (36.4 C) 98.1 F (36.7 C)    TempSrc: Oral Oral    Resp:  18    Height:      Weight:   186 lb 11.7 oz (84.7 kg)   SpO2: 100% 100% 100% 100%     Intake/Output Summary (Last 24 hours) at 04/20/14 1048 Last data filed at 04/20/14 0917  Gross per 24 hour  Intake    660 ml  Output   2200 ml  Net  -1540 ml    LABS: Basic Metabolic Panel:  Recent Labs  04/19/14 0650 04/20/14 0520  NA 140 139  K 4.7 4.6  CL 109 109  CO2 18* 15*  GLUCOSE 187* 175*  BUN 61* 67*  CREATININE 2.71* 2.65*  CALCIUM 9.4 9.4   Liver Function Tests:  Recent Labs  04/18/14 1302 04/19/14 0650  AST 48* 52*  ALT 60* 67*  ALKPHOS 99 107  BILITOT 1.2 1.8*  PROT 6.2 6.8  ALBUMIN 3.2* 3.4*    Recent Labs  04/18/14 1302 04/19/14 0650  LIPASE 106* 73*   CBC:  Recent Labs  04/18/14 1302 04/19/14 0650  WBC 7.6 8.6  NEUTROABS  --  5.1  HGB 12.2 12.6  HCT 37.1 38.6  MCV 86.3 86.9  PLT 135* 132*   Cardiac Enzymes:  Recent Labs  04/19/14 0045 04/19/14 0650 04/19/14 1253  TROPONINI 0.07* <0.03 <0.03   BNP: Invalid input(s): POCBNP D-Dimer: No results for input(s): DDIMER in the last 72 hours. Hemoglobin A1C:  Recent Labs  04/19/14 0045  HGBA1C 5.8*   Fasting Lipid Panel: No results for input(s): CHOL, HDL, LDLCALC, TRIG, CHOLHDL, LDLDIRECT in  the last 72 hours. Thyroid Function Tests:  Recent Labs  04/19/14 0045  TSH 0.942    RADIOLOGY: Dg Chest 2 View  04/18/2014   CLINICAL DATA:  Cough for 1 week.  EXAM: CHEST  2 VIEW  COMPARISON:  02/22/2014  FINDINGS: Left single lead pacer tip is in the right ventricle, unchanged. Prior CABG. Mild cardiomegaly. No confluent opacities or effusions. No acute bony abnormality. Degenerative changes in the thoracic spine.  IMPRESSION: Cardiomegaly.  No active disease.   Electronically Signed   By: Rolm Baptise M.D.   On: 04/18/2014 17:46   Dg Ankle 2 Views Right  04/19/2014   CLINICAL DATA:  Subacute right ankle pain and swelling. Initial encounter.  EXAM: RIGHT ANKLE - 2 VIEW  COMPARISON:  03/25/2014  FINDINGS: Soft tissue swelling is noted.  There is no evidence of acute fracture, subluxation or dislocation.  A small calcaneal spur is present.  No focal bony lesions are present.  IMPRESSION: Soft tissue swelling without acute bony abnormality.   Electronically Signed   By: Margarette Canada M.D.   On: 04/19/2014 15:07   Dg Ankle Complete Right  03/25/2014   CLINICAL  DATA:  Chronic right ankle pain.  No known injuries.  EXAM: RIGHT ANKLE - COMPLETE 3+ VIEW  COMPARISON:  None.  FINDINGS: No evidence of acute or subacute fracture or dislocation. Ankle mortise intact with well preserved joint space. Bone mineral density well-preserved for age. Small ankle joint effusion. Atherosclerotic calcification of the posterior tibial artery. Numerous phleboliths in the subcutaneous tissues of the lower leg. Calcification in the distal Achilles tendon.  IMPRESSION: No acute or significant osseous abnormality. Small ankle joint effusion.   Electronically Signed   By: Evangeline Dakin M.D.   On: 03/25/2014 15:55   Nm Hepatobiliary Liver Func  04/19/2014   CLINICAL DATA:  Abdominal pain with nausea and vomiting.  EXAM: NUCLEAR MEDICINE HEPATOBILIARY IMAGING WITH GALLBLADDER EF  TECHNIQUE: Sequential images of the abdomen  were obtained out to 60 minutes following intravenous administration of radiopharmaceutical. After slow intravenous infusion of 1.7 micrograms Cholecystokinin, gallbladder ejection fraction was determined.  RADIOPHARMACEUTICALS:  5 [employer] HQ-19X Choletec  COMPARISON:  02/16/2014 hepatobiliary scan. 04/18/2014 abdominal ultrasound.  FINDINGS: There is prompt radiotracer uptake by the liver with progressive excretion into the biliary system. The gallbladder and small bowel activity are both identified at around 20-25 minutes. After administration of CCK, there was only mild expulsion of radiotracer from the gallbladder, with a calculated ejection fraction of 26% at 45 minutes (66% ejection fraction at 30 minutes on the prior study). At 45 min, normal ejection fraction is greater than 40%.  The patient did not experience symptoms during CCK infusion.  IMPRESSION: 1. Patent cystic duct and common bile duct. No evidence of acute cholecystitis. 2. Diminished gallbladder ejection fraction, nonspecific but may reflect chronic cholecystitis/ biliary dyskinesia.   Electronically Signed   By: Logan Bores   On: 04/19/2014 17:00   US Abdomen Complete  04/18/2014   CLINICAL DATA:  Abdominal pain and vomiting.  EXAM: ULTRASOUND ABDOMEN COMPLETE  COMPARISON:  Prior abdominal ultrasound on 02/16/2014  FINDINGS: Gallbladder: Stable appearance of cholelithiasis. The gallbladder is not distended. Stable top-normal wall thickness. No sonographic Murphy's sign.  Common bile duct: Diameter: 7 mm in maximum measured diameter. No visualized calculi in the common bile duct by ultrasound.  Liver: No focal lesion identified. Within normal limits in parenchymal echogenicity. No evidence of intrahepatic biliary ductal dilatation.  IVC: No abnormality visualized.  Pancreas: The pancreas is not visualized by ultrasound.  Spleen: Size and appearance within normal limits.  Right Kidney: Length: 12.3 cm. No hydronephrosis. Hyperechoic focus  in the lower pole may represent a nonobstructing calculus.  Left Kidney: Length: 11.0 cm. No hydronephrosis. Stable simple cyst of the upper pole measures 6.3 cm in greatest diameter.  Abdominal aorta: No aneurysm visualized.  Other findings: None.  IMPRESSION: Stable appearance of cholelithiasis. Mildly prominent gallbladder wall thickness is similar to the prior study and may relate to under distention. No overt signs of cholecystitis or biliary obstruction.   Electronically Signed   By: Aletta Edouard M.D.   On: 04/18/2014 21:05   Dg Abd 2 Views  04/18/2014   CLINICAL DATA:  Vomiting for 1 week.  Epigastric pain for 1 week.  EXAM: ABDOMEN - 2 VIEW  COMPARISON:  None.  FINDINGS: Nonobstructive bowel gas pattern.  No free air organomegaly.  A mortise calcifications noted within the Mid and left pelvis. These are of unknown etiology. These appear too diffuse to represent calcified fibroids in may be peritoneal. May consider abdomen and pelvic CT for further evaluation if felt clinically indicated.  IMPRESSION: Extensive calcifications in the pelvis of unknown etiology, which appear too diffuse to represent calcified fibroids. Question peritoneal calcifications. These could be further evaluated with CT if felt clinically indicated.  No evidence of bowel obstruction or free air.   Electronically Signed   By: Rolm Baptise M.D.   On: 04/18/2014 17:50    PHYSICAL EXAM patient is oriented person time and place. Affect is normal. Lungs reveal scattered rhonchi. Cardiac exam reveals S1 and S2. The abdomen is soft. She has 1-2+ bilateral peripheral edema in her lower extremities.   TELEMETRY: I have reviewed telemetry today April 20, 2014. There is sinus rhythm with sinus bradycardia.  ASSESSMENT AND PLAN:    Cardiomyopathy, ischemic - EF ~20-25%; Large MI, existing occluded RCA & SVG-RCA; h/o CABG   S/P ICD (internal cardiac defibrillator) procedure    CKD (chronic kidney disease) stage 3, GFR 30-59  ml/min    I'm hoping that we will see improvement in her BUN and creatinine with diuresis.    Acute on chronic systolic CHF (congestive heart failure)    The patient is diuresing. Plan to continue diuresis watching her renal function. Getting her feet elevated will be helpful. Continuing education about limiting salt and fluid intake will be very important. She will need to be seen in the cardiology office within one week after her date of discharge.   Dola Argyle 04/20/2014 10:48 AM

## 2014-04-21 DIAGNOSIS — I509 Heart failure, unspecified: Secondary | ICD-10-CM

## 2014-04-21 LAB — BASIC METABOLIC PANEL
Anion gap: 13 (ref 5–15)
BUN: 63 mg/dL — AB (ref 6–23)
CHLORIDE: 101 mmol/L (ref 96–112)
CO2: 23 mmol/L (ref 19–32)
CREATININE: 2.18 mg/dL — AB (ref 0.50–1.10)
Calcium: 9.2 mg/dL (ref 8.4–10.5)
GFR calc Af Amer: 25 mL/min — ABNORMAL LOW (ref >90)
GFR, EST NON AFRICAN AMERICAN: 22 mL/min — AB (ref >90)
Glucose, Bld: 350 mg/dL — ABNORMAL HIGH (ref 70–99)
Potassium: 4.4 mmol/L (ref 3.5–5.1)
Sodium: 137 mmol/L (ref 135–145)

## 2014-04-21 LAB — GLUCOSE, CAPILLARY
GLUCOSE-CAPILLARY: 199 mg/dL — AB (ref 70–99)
Glucose-Capillary: 325 mg/dL — ABNORMAL HIGH (ref 70–99)
Glucose-Capillary: 293 mg/dL — ABNORMAL HIGH (ref 70–99)
Glucose-Capillary: 268 mg/dL — ABNORMAL HIGH (ref 70–99)

## 2014-04-21 MED ORDER — INSULIN GLARGINE 100 UNIT/ML ~~LOC~~ SOLN
12.0000 [IU] | Freq: Every day | SUBCUTANEOUS | Status: DC
  Administered 2014-04-21: 12 [IU] via SUBCUTANEOUS
  Filled 2014-04-21 (×2): qty 0.12

## 2014-04-21 MED ORDER — FUROSEMIDE 10 MG/ML IJ SOLN
60.0000 mg | Freq: Two times a day (BID) | INTRAMUSCULAR | Status: DC
  Administered 2014-04-21 – 2014-04-22 (×2): 60 mg via INTRAVENOUS
  Filled 2014-04-21 (×3): qty 6

## 2014-04-21 MED ORDER — INSULIN ASPART 100 UNIT/ML ~~LOC~~ SOLN: 0.0000 [IU] | Freq: Every day | SUBCUTANEOUS | Status: DC

## 2014-04-21 MED ORDER — INSULIN ASPART 100 UNIT/ML ~~LOC~~ SOLN
0.0000 [IU] | Freq: Three times a day (TID) | SUBCUTANEOUS | Status: DC
  Administered 2014-04-21 (×2): 8 [IU] via SUBCUTANEOUS

## 2014-04-21 NOTE — Progress Notes (Signed)
Nutrition Education Note  RD consulted for nutrition education regarding Low Sodium/Carb Modified diet. Patient awoke upon RD visit and asked RD to return tomorrow instead. Left handouts with patient; will follow up tomorrow morning.  Pryor Ochoa RD, LDN Inpatient Clinical Dietitian Pager: [phone] After Hours Pager: [phone]

## 2014-04-21 NOTE — Progress Notes (Signed)
Heart Failure Navigator Consult Note  Presentation: Jo Lynn is a 69 y.o. female with history of ischemic cardiomyopathy large tear measured was 20-25%, status post recent ICD placement, CAD status post CABG and stent placement, diabetes mellitus, hypertension, hyperlipidemia presents to the ER because of worsening lower extremity edema. In addition patient also has been having nausea vomiting. Patient states that she has been having cough over the last 2 months which has been progressively worsening and also patient has noticed increasing swelling of the lower extremities over the last few weeks. Patient also over the last couple of days has been having nausea vomiting which is usually followed by coughing spells. Patient was treated for pneumonia in December of last 2 months ago. Denies any fever chills abdominal pain diarrhea. In the ER chest x-ray does not show any definite infiltrates or congestion. BNP is markedly elevated. On exam patient has bilateral lower extremity edema and has been admitted for decompensated CHF. Patient states that patient's cardiologist had recently increased her Lasix dose. Patient also has been complaining of increasing right ankle pain for which patient has had ABI done by patient's cardiologist which did not show any critical ischemia.    Past Medical History  Diagnosis Date  . Myocardial infarction 10/2002    - MV CAD --> Referred for CABG (no follow-up post CABG)  . 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 Lucianne Lei Trigt: LIMA-LAD, SVG-RPDA, SVG-OM1-OM2  . ST elevation myocardial infarction (STEMI) of inferolateral wall 09/09/2013    SVG-Om1-2 occluded - PCI in 2 locations; Occluded SVG-RCA & native RCA, LAD & native Cx occcluded.  EF ~20%  . Cardiomyopathy, ischemic 09/09/2013    EF ~20-25%; Large MI, existing occluded RCA &  SVG-RCA; h/o CABG  . 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 CAD S/P percutaneous coronary angioplasty 09/09/2013    mid SVG-OM1-OM2 (prox LIMB) Rebel BMS 4.0 mm x 16 mm; sequential limb OM1-OM2: Rebel BMS 4.0 mm x 12 mm  . Combined systolic and diastolic congestive heart failure, NYHA class 3 09/09/2013    Echo post Inferolateral STEMI: Severely reduced LVEF ~20-35%; Akinesis of Inferolateral, Inferior & Inferoseptal walls; Grade 1 DDysfxn (initial LVEDP ~26 mmHg with SBP of 99 mHg)  . Essential hypertension 08/17/2012  . Hyperlipidemia with target LDL less than 70 08/17/2012  . Diabetes mellitus type 2 with complications 0/25/4270  . AICD (automatic cardioverter/defibrillator) present 02/21/2014    single chamber  by dr taylor    History   Social History  . Marital Status: Single    Spouse Name: N/A  . Number of Children: 4  . Years of Education: N/A   Social History Main Topics  . Smoking status: Former Research scientist (life sciences)  . Smokeless tobacco: Never Used  . Alcohol Use: No  . Drug Use: No  . Sexual Activity: Not on file   Other Topics Concern  . None   Social History Narrative    ECHO:Study Conclusions--12/28/13 - Left ventricle: The cavity size was mildly dilated. Wall thickness was normal. The estimated ejection fraction was 25%. Inferolateral and inferior akinesis,  apex not well-visualized but probably akinetic. No LV thrombus noted. Features are consistent with a pseudonormal left ventricular filling pattern, with concomitant abnormal relaxation and increased filling pressure (grade 2 diastolic dysfunction). - Aortic valve: There was no stenosis. There was trivial regurgitation. - Mitral valve: Mildly to moderately calcified annulus. There  was moderate regurgitation. This was posteriorly-directed and likely infarct-related given inferior and inferolateral wall motion abnormalities. - Left atrium: The atrium was mildly dilated. - Right ventricle: The cavity size was normal. Systolic function was mildly to moderately reduced. - Tricuspid valve: Peak RV-RA gradient (S): 51 mm Hg. - Pulmonary arteries: PA peak pressure: 54 mm Hg (S). - Inferior vena cava: The vessel was normal in size. The respirophasic diameter changes were in the normal range (= 50%), consistent with normal central venous pressure.  Impressions:  - Mildly dilated LV with EF 25%. Wall motion abnormalities as noted above. Moderate diastolic dysfunction. Normal RV size with mild to moderate systolic dysfunction. Moderate pulmonary hypertension. There was moderate mitral regurgitation, likely infarct-related.  BNP    Component Value Date/Time   BNP >4500.0* 04/18/2014 1302    ProBNP    Component Value Date/Time   PROBNP 2838.0* 09/13/2013 0850     Education Assessment and Provision:  Detailed education and instructions provided on heart failure disease management including the following:  Signs and symptoms of Heart Failure When to call the physician Importance of daily weights Low sodium diet Fluid restriction Medication management Anticipated future follow-up appointments  Patient education given on each of the above topics.  Patient acknowledges understanding and acceptance of all instructions.  I stopped in to discuss HF diagnosis with Jo Lynn.  Patient lives with daughter and still works 5 days a week --12 hour shifts.  She has a scale however does not weigh daily.   Her only complaint is rt lower leg pain.  She denies SOB.  She also denies any issues with getting or taking medications as prescribed. She admits that she recently stopped using salt while cooking.  I reinforced a low sodium diet and high sodium foods  to avoid.  She expressed some concerns about food choices with sodium restriction as well as diabetes.  I reviewed all topics listed above and she acknowledges understanding.  I encouraged her to call with any concerns and/or questions.  She follows at [facility] outpatient.   Education Materials:  "Living Better With Heart Failure" Booklet, Daily Weight Tracker Tool    High Risk Criteria for Readmission and/or Poor Patient Outcomes:    EF <30%- Yes-25% with grade 2 dias dys.  2 or more admissions in 6 months- Yes  Difficult social situation- No  Demonstrates medication noncompliance- No    Barriers of Care:  Knowledge and compliance  Discharge Planning:   Plans to discharge to home with daughter

## 2014-04-21 NOTE — Progress Notes (Signed)
  Echocardiogram 2D Echocardiogram has been performed.  Jo Lynn 04/21/2014, 12:08 PM

## 2014-04-21 NOTE — Progress Notes (Signed)
PROGRESS NOTE  Jo Lynn BSJ:[id number] DOB: 12-10-1945 DOA: 04/18/2014 PCP: Chevis Pretty, FNP  Summary : Jo Lynn is a 69 yo black female with a history of ischemic cardiomyopathy, STEMI, hypertension, and diabetes mellitus type 2. She is s/p ICD (12/15) and CABG (2004). Pt presents with increased lower extremity edema, orthopnea, and dyspnea on exertion for the past 1 week. Progressively worsened white productive cough since December when she was admitted and treated for pneumonia. Pt states she also has infrequent posttussive emesis, but denies seeing any blood. She states that she takes her medications as prescribed. Increased right ankle pain for which she saw her cardiologist completed an ABI and showed no ischemia.   Pt still complains of lower leg edema, orthopnea, dyspnea on exertion and productive. Denies current N/V. Denies chest pain, wheezing, abdominal pain. States she drinks 48oz of diet coke each day at home along with water and juice.   Subjective. In bed, no headache, no chest pain, still has some orthopnea and shortness of breath, leg edema getting better. Right ankle pain positive.   Assessment/Plan:  Acute on Chronic Systolic and Diastolic Heart Failure -Worsened orthopnea, dyspnea on exertion and lower extremity edema  -Echo on 12/28/2013 showed a dilated  LV and ejection fraction of 25% with grade 2 diastolic dysfunction -CXR on 2/22 shows cardiomegaly with no active disease -Continue Lasix to 2 mg IV twice a day, monitor BMP cardiology following blood pressure too soft for beta blocker currently. Hold hydralazine as blood pressure low. -Continue ASA 81mg  daily with Plavix 75mg  daily  -Continue Toprol XR 25mg  twice daily if blood pressure able to tolerate -Fluid restriction education given, should restrict to 1259mL, daughter updated   Filed Weights   04/19/14 0620 04/20/14 0534 04/21/14 0610  Weight: 85.9 kg (189 lb 6 oz) 84.7 kg (186 lb 11.7 oz)  82.872 kg (182 lb 11.2 oz)    Posttussive emesis -Occasional posttussive emesis, denies blood -Abdominal Xray on 2/22 shows diffuse calcifications in the pelvis -Abdominal US on 2/22 shows stable appearance of cholelithiasis , HIDA unremarkable outpatient follow-up with Gen. surgery for gallstones. -Tussionex 9mL Q12hours PRN for cough   Diabetes mellitus, type 2, uncontrolled -Current CBG is 187 and decreased significantly from admission -Continue Linagliptin 5mg  daily and Novolog three times daily, Lantus added  -Monitor CBG  CBG (last 3)   Recent Labs  04/20/14 2109 04/21/14 0610 04/21/14 1138  GLUCAP 480* 325* 293*      Hypertension  -Pressure soft hydralazine and beta blocker held, monitor. Lasix Imdur continue.   ARF on Chronic Kidney Disease III -Her baseline creatinine appears to be close to 2, diuretics per cardiology, avoid ACE/ARB and nephrotoxins. Monitor creatinine. Still has evidence of fluid overload.  Elevated LFT and lipase -AST 52; ALT 67; Lipase 73; this is a slight change from yesterday's results of AST 48; ALT 60; Lipase 106 -From heart failure and hepatic congestion. HIDA scan unremarkable.    Right ankle pain -Complaint of increased right ankle pain; saw cardiologist and ABI was negative (03/11/2014) -Because it elevated, x-ray shows mild soft tissue edema this is consistent with acute gout. We'll give her IV Solu-Medrol one dose and place on coach is seen. Outpt ortho follow up    DVT Prophylaxis:  Plavix  Code Status: Full Family Communication: daughter Disposition Plan: Inpatient    Consultants:  Cards  Procedures:   TTE 2015  Mildly dilated LV with EF 25%. Wall motion abnormalities as noted above. Moderate diastolic dysfunction.  Normal RV size with mild to moderate systolic dysfunction. Moderate pulmonaryhypertension. There was moderate mitral regurgitation, likely  infarct-related.    Antibiotics:  None  Objective: Filed Vitals:   04/20/14 1411 04/20/14 2016 04/21/14 0610 04/21/14 0850  BP: 113/65 123/75 107/64 114/62  Pulse: 74 75 79 81  Temp: 97.5 F (36.4 C) 97.7 F (36.5 C) 97.8 F (36.6 C) 97.6 F (36.4 C)  TempSrc: Oral Oral Oral Oral  Resp: 16 18 17 18   Height:      Weight:   82.872 kg (182 lb 11.2 oz)   SpO2: 99% 100% 97% 100%    Intake/Output Summary (Last 24 hours) at 04/21/14 1223 Last data filed at 04/21/14 0844  Gross per 24 hour  Intake   1040 ml  Output   7200 ml  Net  -6160 ml   Filed Weights   05-09-14 0620 04/20/14 0534 04/21/14 0610  Weight: 85.9 kg (189 lb 6 oz) 84.7 kg (186 lb 11.7 oz) 82.872 kg (182 lb 11.2 oz)    Exam: General: Well developed, well nourished, NAD, appears stated age  49:  PERR, EOMI, Anicteic Sclera, MMM. No pharyngeal erythema or exudates  Neck: Supple, no JVD, no masses  Cardiovascular: RRR, S1 S2 auscultated, no rubs, murmurs or gallops.   Respiratory: Clear to auscultation bilaterally with equal chest rise  Abdomen: Soft, nontender, nondistended, + bowel sounds. No tenderness was elicited.  Extremities: warm dry without cyanosis clubbing. 2+ pitting lower extremity edema  Neuro: AAOx3, cranial nerves grossly intact. Strength 5/5 in upper and lower extremities  Skin: Without rashes exudates or nodules.   Psych: Normal affect and demeanor with intact judgement and insight   Data Reviewed: Basic Metabolic Panel:  Recent Labs Lab 04/18/14 1302 May 09, 2014 0650 04/20/14 0520 04/21/14 0415  NA 136 140 139 137  K 4.8 4.7 4.6 4.4  CL 106 109 109 101  CO2 19 18* 15* 23  GLUCOSE 426* 187* 175* 350*  BUN 54* 61* 67* 63*  CREATININE 1.98* 2.71* 2.65* 2.18*  CALCIUM 9.1 9.4 9.4 9.2   Liver Function Tests:  Recent Labs Lab 04/18/14 1302 2014-05-09 0650  AST 48* 52*  ALT 60* 67*  ALKPHOS 99 107  BILITOT 1.2 1.8*  PROT 6.2 6.8  ALBUMIN 3.2* 3.4*    Recent Labs Lab  04/18/14 1302 05-09-14 0650  LIPASE 106* 73*   No results for input(s): AMMONIA in the last 168 hours. CBC:  Recent Labs Lab 04/18/14 1302 2014-05-09 0650  WBC 7.6 8.6  NEUTROABS  --  5.1  HGB 12.2 12.6  HCT 37.1 38.6  MCV 86.3 86.9  PLT 135* 132*   Cardiac Enzymes:  Recent Labs Lab 2014-05-09 0045 May 09, 2014 0650 May 09, 2014 1253  TROPONINI 0.07* <0.03 <0.03   BNP (last 3 results)  Recent Labs  02/15/14 2147 02/19/14 1303 04/18/14 1302  BNP 4826.4* 4653.0* >4500.0*    ProBNP (last 3 results)  Recent Labs  09/09/13 1055 09/11/13 0850 09/13/13 0850  PROBNP 2561.0* 9429.0* 2838.0*    CBG:  Recent Labs Lab 04/20/14 1118 04/20/14 1613 04/20/14 2109 04/21/14 0610 04/21/14 1138  GLUCAP 236* 328* 480* 325* 293*    No results found for this or any previous visit (from the past 240 hour(s)).   Studies: Dg Ankle 2 Views Right  2014-05-09   CLINICAL DATA:  Subacute right ankle pain and swelling. Initial encounter.  EXAM: RIGHT ANKLE - 2 VIEW  COMPARISON:  03/25/2014  FINDINGS: Soft tissue swelling is noted.  There  is no evidence of acute fracture, subluxation or dislocation.  A small calcaneal spur is present.  No focal bony lesions are present.  IMPRESSION: Soft tissue swelling without acute bony abnormality.   Electronically Signed   By: Margarette Canada M.D.   On: 04/19/2014 15:07   Nm Hepatobiliary Liver Func  04/19/2014   CLINICAL DATA:  Abdominal pain with nausea and vomiting.  EXAM: NUCLEAR MEDICINE HEPATOBILIARY IMAGING WITH GALLBLADDER EF  TECHNIQUE: Sequential images of the abdomen were obtained out to 60 minutes following intravenous administration of radiopharmaceutical. After slow intravenous infusion of 1.7 micrograms Cholecystokinin, gallbladder ejection fraction was determined.  RADIOPHARMACEUTICALS:  5 [employer] [id number] Choletec  COMPARISON:  02/16/2014 hepatobiliary scan. 04/18/2014 abdominal ultrasound.  FINDINGS: There is prompt radiotracer uptake by the  liver with progressive excretion into the biliary system. The gallbladder and small bowel activity are both identified at around 20-25 minutes. After administration of CCK, there was only mild expulsion of radiotracer from the gallbladder, with a calculated ejection fraction of 26% at 45 minutes (66% ejection fraction at 30 minutes on the prior study). At 45 min, normal ejection fraction is greater than 40%.  The patient did not experience symptoms during CCK infusion.  IMPRESSION: 1. Patent cystic duct and common bile duct. No evidence of acute cholecystitis. 2. Diminished gallbladder ejection fraction, nonspecific but may reflect chronic cholecystitis/ biliary dyskinesia.   Electronically Signed   By: Logan Bores   On: 04/19/2014 17:00    Scheduled Meds: . aspirin EC  81 mg Oral Daily  . atorvastatin  20 mg Oral q1800  . clopidogrel  75 mg Oral Daily  . colchicine  0.6 mg Oral BID  . furosemide  60 mg Intravenous BID  . heparin subcutaneous  5,000 Units Subcutaneous 3 times per day  . insulin aspart  0-15 Units Subcutaneous TID WC  . insulin aspart  0-5 Units Subcutaneous QHS  . insulin glargine  12 Units Subcutaneous Daily  . isosorbide mononitrate  30 mg Oral Daily  . linagliptin  5 mg Oral Daily  . sincalide  0.02 mcg/kg Intravenous Once  . sodium chloride  3 mL Intravenous Q12H   Continuous Infusions:   Active Problems:   Essential hypertension   Cardiomyopathy, ischemic - EF ~20-25%; Large MI, existing occluded RCA & SVG-RCA; h/o CABG   S/P ICD (internal cardiac defibrillator) procedure   CKD (chronic kidney disease) stage 3, GFR 30-59 ml/min   Diabetes mellitus type 2, uncontrolled   Nausea with vomiting   Acute on chronic systolic CHF (congestive heart failure)    Lala Lund K M.D on 04/21/2014 at 12:23 PM  Between 7am to 7pm - Pager - [phone], After 7pm go to [email or web address] - password [email or web address]  Office  [phone]

## 2014-04-21 NOTE — Progress Notes (Signed)
Patient Name: Jo Lynn Date of Encounter: 04/21/2014  Primary Cardiologist: Dr. Percival Spanish   Active Problems:   Essential hypertension   Cardiomyopathy, ischemic - EF ~20-25%; Large MI, existing occluded RCA & SVG-RCA; h/o CABG   S/P ICD (internal cardiac defibrillator) procedure   CKD (chronic kidney disease) stage 3, GFR 30-59 ml/min   Diabetes mellitus type 2, uncontrolled   Nausea with vomiting   Acute on chronic systolic CHF (congestive heart failure)    SUBJECTIVE  Denies any SOB or CP. Complain of R Lower leg pain. No prior h/o blood clot  CURRENT MEDS . aspirin EC  81 mg Oral Daily  . atorvastatin  20 mg Oral q1800  . clopidogrel  75 mg Oral Daily  . colchicine  0.6 mg Oral BID  . furosemide  60 mg Intravenous BID  . heparin subcutaneous  5,000 Units Subcutaneous 3 times per day  . insulin aspart  0-15 Units Subcutaneous TID WC  . insulin aspart  0-5 Units Subcutaneous QHS  . insulin glargine  12 Units Subcutaneous Daily  . isosorbide mononitrate  30 mg Oral Daily  . linagliptin  5 mg Oral Daily  . sincalide  0.02 mcg/kg Intravenous Once  . sodium chloride  3 mL Intravenous Q12H    OBJECTIVE  Filed Vitals:   04/20/14 1411 04/20/14 2016 04/21/14 0610 04/21/14 0850  BP: 113/65 123/75 107/64 114/62  Pulse: 74 75 79 81  Temp: 97.5 F (36.4 C) 97.7 F (36.5 C) 97.8 F (36.6 C) 97.6 F (36.4 C)  TempSrc: Oral Oral Oral Oral  Resp: 16 18 17 18   Height:      Weight:   182 lb 11.2 oz (82.872 kg)   SpO2: 99% 100% 97% 100%    Intake/Output Summary (Last 24 hours) at 04/21/14 1146 Last data filed at 04/21/14 0844  Gross per 24 hour  Intake   1040 ml  Output   7200 ml  Net  -6160 ml   Filed Weights   04/19/14 0620 04/20/14 0534 04/21/14 0610  Weight: 189 lb 6 oz (85.9 kg) 186 lb 11.7 oz (84.7 kg) 182 lb 11.2 oz (82.872 kg)    PHYSICAL EXAM  General: Pleasant, NAD. Neuro: Alert and oriented X 3. Moves all extremities spontaneously. Psych: Normal  affect. HEENT:  Normal  Neck: Supple without bruits +JVD. Lungs:  Resp regular and unlabored, CTA. Heart: RRR no s3, s4. 1/6 systolic murmur Abdomen: Soft, non-tender, non-distended, BS + x 4.  Extremities: No clubbing, cyanosis. DP/PT/Radials 2+ and equal bilaterally. 1+ pitting edema in bilateral LE  Accessory Clinical Findings  CBC  Recent Labs  04/18/14 1302 04/19/14 0650  WBC 7.6 8.6  NEUTROABS  --  5.1  HGB 12.2 12.6  HCT 37.1 38.6  MCV 86.3 86.9  PLT 135* 413*   Basic Metabolic Panel  Recent Labs  04/20/14 0520 04/21/14 0415  NA 139 137  K 4.6 4.4  CL 109 101  CO2 15* 23  GLUCOSE 175* 350*  BUN 67* 63*  CREATININE 2.65* 2.18*  CALCIUM 9.4 9.2   Liver Function Tests  Recent Labs  04/18/14 1302 04/19/14 0650  AST 48* 52*  ALT 60* 67*  ALKPHOS 99 107  BILITOT 1.2 1.8*  PROT 6.2 6.8  ALBUMIN 3.2* 3.4*    Recent Labs  04/18/14 1302 04/19/14 0650  LIPASE 106* 73*   Cardiac Enzymes  Recent Labs  04/19/14 0045 04/19/14 0650 04/19/14 1253  TROPONINI 0.07* <0.03 <0.03   Hemoglobin A1C  Recent Labs  04/19/14 0045  HGBA1C 5.8*   Thyroid Function Tests  Recent Labs  04/19/14 0045  TSH 0.942    TELE NSR with HR 60-80s    ECG  No new EKG  Echocardiogram 12/28/2013  LV EF: 25%  ------------------------------------------------------------------- Indications:   425.4 Other primary Cardiomyopathies.  ------------------------------------------------------------------- History:  PMH: Hyperlipidemia. PMH:  Myocardial infarction. Risk factors: Hypertension. Diabetes mellitus.  ------------------------------------------------------------------- Study Conclusions  - Left ventricle: The cavity size was mildly dilated. Wall thickness was normal. The estimated ejection fraction was 25%. Inferolateral and inferior akinesis, apex not well-visualized but probably akinetic. No LV thrombus noted. Features are  consistent with a pseudonormal left ventricular filling pattern, with concomitant abnormal relaxation and increased filling pressure (grade 2 diastolic dysfunction). - Aortic valve: There was no stenosis. There was trivial regurgitation. - Mitral valve: Mildly to moderately calcified annulus. There was moderate regurgitation. This was posteriorly-directed and likely infarct-related given inferior and inferolateral wall motion abnormalities. - Left atrium: The atrium was mildly dilated. - Right ventricle: The cavity size was normal. Systolic function was mildly to moderately reduced. - Tricuspid valve: Peak RV-RA gradient (S): 51 mm Hg. - Pulmonary arteries: PA peak pressure: 54 mm Hg (S). - Inferior vena cava: The vessel was normal in size. The respirophasic diameter changes were in the normal range (= 50%), consistent with normal central venous pressure.  Impressions:  - Mildly dilated LV with EF 25%. Wall motion abnormalities as noted above. Moderate diastolic dysfunction. Normal RV size with mild to moderate systolic dysfunction. Moderate pulmonary hypertension. There was moderate mitral regurgitation, likely infarct-related.    Radiology/Studies  Dg Chest 2 View  04/18/2014   CLINICAL DATA:  Cough for 1 week.  EXAM: CHEST  2 VIEW  COMPARISON:  02/22/2014  FINDINGS: Left single lead pacer tip is in the right ventricle, unchanged. Prior CABG. Mild cardiomegaly. No confluent opacities or effusions. No acute bony abnormality. Degenerative changes in the thoracic spine.  IMPRESSION: Cardiomegaly.  No active disease.   Electronically Signed   By: Rolm Baptise M.D.   On: 04/18/2014 17:46   Dg Ankle 2 Views Right  04/19/2014   CLINICAL DATA:  Subacute right ankle pain and swelling. Initial encounter.  EXAM: RIGHT ANKLE - 2 VIEW  COMPARISON:  03/25/2014  FINDINGS: Soft tissue swelling is noted.  There is no evidence of acute fracture, subluxation or  dislocation.  A small calcaneal spur is present.  No focal bony lesions are present.  IMPRESSION: Soft tissue swelling without acute bony abnormality.   Electronically Signed   By: Margarette Canada M.D.   On: 04/19/2014 15:07   Dg Ankle Complete Right  03/25/2014   CLINICAL DATA:  Chronic right ankle pain.  No known injuries.  EXAM: RIGHT ANKLE - COMPLETE 3+ VIEW  COMPARISON:  None.  FINDINGS: No evidence of acute or subacute fracture or dislocation. Ankle mortise intact with well preserved joint space. Bone mineral density well-preserved for age. Small ankle joint effusion. Atherosclerotic calcification of the posterior tibial artery. Numerous phleboliths in the subcutaneous tissues of the lower leg. Calcification in the distal Achilles tendon.  IMPRESSION: No acute or significant osseous abnormality. Small ankle joint effusion.   Electronically Signed   By: Evangeline Dakin M.D.   On: 03/25/2014 15:55   Nm Hepatobiliary Liver Func  04/19/2014   CLINICAL DATA:  Abdominal pain with nausea and vomiting.  EXAM: NUCLEAR MEDICINE HEPATOBILIARY IMAGING WITH GALLBLADDER EF  TECHNIQUE: Sequential images of the abdomen were obtained  out to 60 minutes following intravenous administration of radiopharmaceutical. After slow intravenous infusion of 1.7 micrograms Cholecystokinin, gallbladder ejection fraction was determined.  RADIOPHARMACEUTICALS:  5 [employer] [id number] Choletec  COMPARISON:  02/16/2014 hepatobiliary scan. 04/18/2014 abdominal ultrasound.  FINDINGS: There is prompt radiotracer uptake by the liver with progressive excretion into the biliary system. The gallbladder and small bowel activity are both identified at around 20-25 minutes. After administration of CCK, there was only mild expulsion of radiotracer from the gallbladder, with a calculated ejection fraction of 26% at 45 minutes (66% ejection fraction at 30 minutes on the prior study). At 45 min, normal ejection fraction is greater than 40%.  The patient did  not experience symptoms during CCK infusion.  IMPRESSION: 1. Patent cystic duct and common bile duct. No evidence of acute cholecystitis. 2. Diminished gallbladder ejection fraction, nonspecific but may reflect chronic cholecystitis/ biliary dyskinesia.   Electronically Signed   By: Logan Bores   On: 04/19/2014 17:00   US Abdomen Complete  04/18/2014   CLINICAL DATA:  Abdominal pain and vomiting.  EXAM: ULTRASOUND ABDOMEN COMPLETE  COMPARISON:  Prior abdominal ultrasound on 02/16/2014  FINDINGS: Gallbladder: Stable appearance of cholelithiasis. The gallbladder is not distended. Stable top-normal wall thickness. No sonographic Murphy's sign.  Common bile duct: Diameter: 7 mm in maximum measured diameter. No visualized calculi in the common bile duct by ultrasound.  Liver: No focal lesion identified. Within normal limits in parenchymal echogenicity. No evidence of intrahepatic biliary ductal dilatation.  IVC: No abnormality visualized.  Pancreas: The pancreas is not visualized by ultrasound.  Spleen: Size and appearance within normal limits.  Right Kidney: Length: 12.3 cm. No hydronephrosis. Hyperechoic focus in the lower pole may represent a nonobstructing calculus.  Left Kidney: Length: 11.0 cm. No hydronephrosis. Stable simple cyst of the upper pole measures 6.3 cm in greatest diameter.  Abdominal aorta: No aneurysm visualized.  Other findings: None.  IMPRESSION: Stable appearance of cholelithiasis. Mildly prominent gallbladder wall thickness is similar to the prior study and may relate to under distention. No overt signs of cholecystitis or biliary obstruction.   Electronically Signed   By: Aletta Edouard M.D.   On: 04/18/2014 21:05   Dg Abd 2 Views  04/18/2014   CLINICAL DATA:  Vomiting for 1 week.  Epigastric pain for 1 week.  EXAM: ABDOMEN - 2 VIEW  COMPARISON:  None.  FINDINGS: Nonobstructive bowel gas pattern.  No free air organomegaly.  A mortise calcifications noted within the Mid and left pelvis.  These are of unknown etiology. These appear too diffuse to represent calcified fibroids in may be peritoneal. May consider abdomen and pelvic CT for further evaluation if felt clinically indicated.  IMPRESSION: Extensive calcifications in the pelvis of unknown etiology, which appear too diffuse to represent calcified fibroids. Question peritoneal calcifications. These could be further evaluated with CT if felt clinically indicated.  No evidence of bowel obstruction or free air.   Electronically Signed   By: Rolm Baptise M.D.   On: 04/18/2014 17:50    ASSESSMENT AND PLAN  1. Acute on chronic systolic heart failure  - well diuresed, I/O not accurate since yesterday (says he had output of 6026ml during yesterday afternoon, likely 654ml), weight down from 189 to 182  - Echo 12/28/2013 EF 80%, grade 2 diastolic dysfunction, moderate MR, PA peak pressure [id number]  - conflicting signs on physical exam likely related to R heart failure, lung clear, however +JVD and LE edema. No sure how much more we can  diurese. Can try IV lasix for one more day, however likely close to euvolemic state  - pending repeat echo  2. Ischemic cardiomyopathy s/p ICD 01/2014   - EF ~20-25%; Large MI, existing occluded RCA & SVG-RCA; h/o CABG  - continue ASA, plavix and statin 3. Chronic stage III renal failure  4. CAD s/p CABG 10/2002  5. R leg pain: colchicine started, continue to have pain  6. Moderate MR  Signed, Almyra Deforest PA-C Pager: [phone] Patient seen and examined. I agree with the assessment and plan as detailed above. See also my additional thoughts below.   The patient is definitely improving. She is diuresing well. Fortunately, renal function is improving along with the diuresis. The plans are outlined above.  Dola Argyle, MD, [facility] 04/21/2014 12:54 PM

## 2014-04-21 NOTE — Progress Notes (Signed)
Output charted for 06:20 AM on 2/25 was 5813ml. NT verbalized that output was actually 578ml and 5800 was charted in error. NT notified RN that this would be corrected in chart.

## 2014-04-22 LAB — BASIC METABOLIC PANEL
ANION GAP: 7 (ref 5–15)
BUN: 50 mg/dL — ABNORMAL HIGH (ref 6–23)
CALCIUM: 8.5 mg/dL (ref 8.4–10.5)
CO2: 26 mmol/L (ref 19–32)
Chloride: 104 mmol/L (ref 96–112)
Creatinine, Ser: 1.8 mg/dL — ABNORMAL HIGH (ref 0.50–1.10)
GFR, EST AFRICAN AMERICAN: 32 mL/min — AB (ref >90)
GFR, EST NON AFRICAN AMERICAN: 28 mL/min — AB (ref >90)
Glucose, Bld: 127 mg/dL — ABNORMAL HIGH (ref 70–99)
Potassium: 3.7 mmol/L (ref 3.5–5.1)
SODIUM: 137 mmol/L (ref 135–145)

## 2014-04-22 LAB — GLUCOSE, CAPILLARY: Glucose-Capillary: 106 mg/dL — ABNORMAL HIGH (ref 70–99)

## 2014-04-22 MED ORDER — COLCHICINE 0.6 MG PO TABS: 0.6000 mg | ORAL_TABLET | Freq: Every day | ORAL | Status: DC

## 2014-04-22 MED ORDER — METOLAZONE 2.5 MG PO TABS: 2.5000 mg | ORAL_TABLET | ORAL | Status: DC

## 2014-04-22 MED ORDER — FUROSEMIDE 40 MG PO TABS: 40.0000 mg | ORAL_TABLET | Freq: Two times a day (BID) | ORAL | Status: DC

## 2014-04-22 MED ORDER — POTASSIUM CHLORIDE ER 20 MEQ PO TBCR: 20.0000 meq | EXTENDED_RELEASE_TABLET | Freq: Every day | ORAL | Status: DC

## 2014-04-22 NOTE — Progress Notes (Addendum)
Subjective: No SOB. Right ankle hurts when she walks  Objective: Vital signs in last 24 hours: Temp:  [97.7 F (36.5 C)-98.3 F (36.8 C)] 98.2 F (36.8 C) (02/26 0509) Pulse Rate:  [77-82] 82 (02/26 0509) Resp:  [18-20] 20 (02/26 0509) BP: (102-131)/(50-95) 131/89 mmHg (02/26 0509) SpO2:  [97 %-100 %] 97 % (02/26 0509) Weight:  [181 lb (82.1 kg)] 181 lb (82.1 kg) (02/26 0509) Last BM Date: 04/21/14  Intake/Output from previous day: 02/25 0701 - 02/26 0700 In: 1080 [P.O.:1080] Out: 1850 [Urine:1850] Intake/Output this shift: Total I/O In: 360 [P.O.:360] Out: 350 [Urine:350]  Medications Current Facility-Administered Medications  Medication Dose Route Frequency Provider Last Rate Last Dose  . acetaminophen (TYLENOL) tablet 650 mg  650 mg Oral Q6H PRN Rise Patience, MD   650 mg at 04/21/14 2150   Or  . acetaminophen (TYLENOL) suppository 650 mg  650 mg Rectal Q6H PRN Rise Patience, MD      . aspirin EC tablet 81 mg  81 mg Oral Daily Rise Patience, MD   81 mg at 04/22/14 0932  . atorvastatin (LIPITOR) tablet 20 mg  20 mg Oral q1800 Lelon Perla, MD   20 mg at 04/21/14 1750  . chlorpheniramine-HYDROcodone (TUSSIONEX) 10-8 MG/5ML suspension 5 mL  5 mL Oral Q12H PRN Kelvin Cellar, MD   5 mL at 04/21/14 2150  . clopidogrel (PLAVIX) tablet 75 mg  75 mg Oral Daily Rise Patience, MD   75 mg at 04/22/14 0932  . colchicine tablet 0.6 mg  0.6 mg Oral BID Thurnell Lose, MD   0.6 mg at 04/22/14 0932  . fentaNYL (SUBLIMAZE) injection 12.5 mcg  12.5 mcg Intravenous Q2H PRN Rise Patience, MD   12.5 mcg at 04/20/14 2058  . furosemide (LASIX) injection 60 mg  60 mg Intravenous BID Thurnell Lose, MD   60 mg at 04/22/14 0840  . heparin injection 5,000 Units  5,000 Units Subcutaneous 3 times per day Kelvin Cellar, MD   5,000 Units at 04/22/14 0557  . insulin aspart (novoLOG) injection 0-15 Units  0-15 Units Subcutaneous TID WC Thurnell Lose, MD   8  Units at 04/21/14 1751  . insulin aspart (novoLOG) injection 0-5 Units  0-5 Units Subcutaneous QHS Thurnell Lose, MD   0 Units at 04/21/14 2200  . isosorbide mononitrate (IMDUR) 24 hr tablet 30 mg  30 mg Oral Daily Lelon Perla, MD   30 mg at 04/22/14 0932  . linagliptin (TRADJENTA) tablet 5 mg  5 mg Oral Daily Rise Patience, MD   5 mg at 04/22/14 0932  . nitroGLYCERIN (NITROSTAT) SL tablet 0.4 mg  0.4 mg Sublingual Q5 min PRN Rise Patience, MD      . ondansetron [facility]) tablet 4 mg  4 mg Oral Q6H PRN Rise Patience, MD       Or  . ondansetron (ZOFRAN) injection 4 mg  4 mg Intravenous Q6H PRN Rise Patience, MD      . sincalide [facility]) injection 1.7 mcg  0.02 mcg/kg Intravenous Once Rise Patience, MD      . sodium chloride 0.9 % injection 3 mL  3 mL Intravenous Q12H Rise Patience, MD   3 mL at 04/22/14 0933    PE: General appearance: alert, cooperative and no distress Neck: no JVD Lungs: clear to auscultation bilaterally Heart: regular rate and rhythm, S1, S2 normal, no murmur, click, rub or gallop  Extremities: 2+ left LEE 1+ right Pulses: 2+ radials, 1+ right PT  Skin: Warm and dry Neurologic: Grossly normal  Lab Results:  No results for input(s): WBC, HGB, HCT, PLT in the last 72 hours. BMET  Recent Labs  04/20/14 0520 04/21/14 0415 04/22/14 0427  NA 139 137 137  K 4.6 4.4 3.7  CL 109 101 104  CO2 15* 23 26  GLUCOSE 175* 350* 127*  BUN 67* 63* 50*  CREATININE 2.65* 2.18* 1.80*  CALCIUM 9.4 9.2 8.5      Assessment/Plan     Acute on chronic systolic CHF (congestive heart failure)   Echo 12/28/2013 EF 10%, grade 2 diastolic dysfunction, moderate MR, PA peak pressure 65mmHg Continues to diurese-0.8L last 24 hrs.  Lasix changed to home dose 40mg  BID for discharge today.   Wt 189>> 181#(today)   Essential hypertension   Controlled.     Cardiomyopathy, ischemic - EF ~20-25%; Large MI, existing occluded RCA & SVG-RCA; h/o  CABG  ASA, plavix, statin   S/P ICD (internal cardiac defibrillator) procedure, 01/2014   CKD (chronic kidney disease) stage 3, GFR 30-59 ml/min  Improving SCr2.18>>1.80.     Diabetes mellitus type 2, uncontrolled   Nausea with vomiting   Follow up appt with Dr. Percival Spanish arranged.      LOS: 4 days    HAGER, [name] PA-C 04/22/2014 11:01 AM   I agree with the assessment and plan as detailed above. See also my additional thoughts below.   We have made follow-up plans with Dr. Percival Spanish.  Dola Argyle, MD, [facility] 04/22/2014 11:54 AM

## 2014-04-22 NOTE — Discharge Instructions (Signed)
Follow with Primary MD Chevis Pretty, FNP in 7 days   Get CBC, CMP, 2 view Chest X ray checked  by Primary MD next visit.    Activity: As tolerated with Full fall precautions use walker/cane & assistance as needed   Disposition Home     Diet: Heart Healthy Low carb.  Check your Weight same time everyday, if you gain over 2 pounds, or you develop in leg swelling, experience more shortness of breath or chest pain, call your Primary MD immediately. Follow Cardiac Low Salt Diet and 1.5 lit/day fluid restriction.   On your next visit with your primary care physician please Get Medicines reviewed and adjusted.   Please request your Prim.MD to go over all Hospital Tests and Procedure/Radiological results at the follow up, please get all Hospital records sent to your Prim MD by signing hospital release before you go home.   If you experience worsening of your admission symptoms, develop shortness of breath, life threatening emergency, suicidal or homicidal thoughts you must seek medical attention immediately by calling 911 or calling your MD immediately  if symptoms less severe.  You Must read complete instructions/literature along with all the possible adverse reactions/side effects for all the Medicines you take and that have been prescribed to you. Take any new Medicines after you have completely understood and accpet all the possible adverse reactions/side effects.   Do not drive, operating heavy machinery, perform activities at heights, swimming or participation in water activities or provide baby sitting services if your were admitted for syncope or siezures until you have seen by Primary MD or a Neurologist and advised to do so again.  Do not drive when taking Pain medications.    Do not take more than prescribed Pain, Sleep and Anxiety Medications  Special Instructions: If you have smoked or chewed Tobacco  in the last 2 yrs please stop smoking, stop any regular Alcohol  and  or any Recreational drug use.  Wear Seat belts while driving.   Please note  You were cared for by a hospitalist during your hospital stay. If you have any questions about your discharge medications or the care you received while you were in the hospital after you are discharged, you can call the unit and asked to speak with the hospitalist on call if the hospitalist that took care of you is not available. Once you are discharged, your primary care physician will handle any further medical issues. Please note that NO REFILLS for any discharge medications will be authorized once you are discharged, as it is imperative that you return to your primary care physician (or establish a relationship with a primary care physician if you do not have one) for your aftercare needs so that they can reassess your need for medications and monitor your lab values.                                                       Jo Lynn was admitted to the Hospital on 04/18/2014 and Discharged  04/22/2014 and should be excused from work/school   for 5  days starting 04/18/2014 , may return to work/school without any restrictions.  Call Lala Lund MD, [facility]  [phone] with questions.  Thurnell Lose M.D on 04/22/2014,at 8:47 AM  [facility]   Office  [phone]

## 2014-04-22 NOTE — Plan of Care (Signed)
Problem: Phase I Progression Outcomes Goal: EF % per last Echo/documented,Core Reminder form on chart Outcome: Completed/Met Date Met:  04/22/14 EF 20-25% per echo on 04/21/14

## 2014-04-22 NOTE — Discharge Summary (Signed)
Jo Lynn, is a 69 y.o. female  DOB [birth year]  MRN [id number].  Admission date:  04/18/2014  Admitting Physician  Rise Patience, MD  Discharge Date:  04/22/2014   Primary MD  Chevis Pretty, FNP  Recommendations for primary care physician for things to follow:   Monitor weight, CMP, diuretic dose closely.  One-time outpatient follow-up with general surgery recommended due to gallstones in gallbladder, if liver enzymes continue to be elevated outpatient GI follow-up as well.    Admission Diagnosis  Cough [R05] Abdominal pain [R10.9] Congestive heart failure, unspecified congestive heart failure chronicity, unspecified congestive heart failure type [I50.9] Non-intractable vomiting with nausea, vomiting of unspecified type [R11.2]   Discharge Diagnosis  Cough [R05] Abdominal pain [R10.9] Congestive heart failure, unspecified congestive heart failure chronicity, unspecified congestive heart failure type [I50.9] Non-intractable vomiting with nausea, vomiting of unspecified type [R11.2]     Active Problems:   Essential hypertension   Cardiomyopathy, ischemic - EF ~20-25%; Large MI, existing occluded RCA & SVG-RCA; h/o CABG   S/P ICD (internal cardiac defibrillator) procedure   CKD (chronic kidney disease) stage 3, GFR 30-59 ml/min   Diabetes mellitus type 2, uncontrolled   Nausea with vomiting   Acute on chronic systolic CHF (congestive heart failure)      Past Medical History  Diagnosis Date  . Myocardial infarction 10/2002    - MV CAD --> Referred for CABG (no follow-up post CABG)  . 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 Lucianne Lei Trigt: LIMA-LAD, SVG-RPDA,  SVG-OM1-OM2  . ST elevation myocardial infarction (STEMI) of inferolateral wall 09/09/2013    SVG-Om1-2 occluded - PCI in 2 locations; Occluded SVG-RCA & native RCA, LAD & native Cx occcluded.  EF ~20%  . Cardiomyopathy, ischemic 09/09/2013    EF ~20-25%; Large MI, existing occluded RCA & SVG-RCA; h/o CABG  . 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 CAD S/P percutaneous coronary angioplasty 09/09/2013    mid SVG-OM1-OM2 (prox LIMB) Rebel BMS 4.0 mm x 16 mm; sequential limb OM1-OM2: Rebel BMS 4.0 mm x 12 mm  . Combined systolic and diastolic congestive heart failure, NYHA class 3 09/09/2013    Echo post Inferolateral STEMI: Severely reduced LVEF ~20-35%; Akinesis of Inferolateral, Inferior & Inferoseptal walls; Grade 1 DDysfxn (initial LVEDP ~26 mmHg with SBP of 99 mHg)  . Essential hypertension 08/17/2012  . Hyperlipidemia with target LDL less than 70 08/17/2012  . Diabetes mellitus type 2 with complications 08/01/3014  . AICD (automatic cardioverter/defibrillator) present 02/21/2014    single chamber  by dr taylor    Past Surgical History  Procedure Laterality  Date  . Coronary artery bypass graft    . Left heart catheterization with coronary angiogram N/A 09/09/2013    Procedure: LEFT HEART CATHETERIZATION WITH CORONARY ANGIOGRAM;  Surgeon: Leonie Man, MD;  Location: [facility] CATH LAB;  Service: Cardiovascular;  Laterality: N/A;  . Implantable cardioverter defibrillator implant  02-21-2014    STJ single chamber ICD implanted by Dr Lovena Le for primary prevention  . Implantable cardioverter defibrillator implant N/A 02/21/2014    Procedure: IMPLANTABLE CARDIOVERTER DEFIBRILLATOR IMPLANT;  Surgeon: Evans Lance, MD;  Location: [facility] CATH LAB;  Service: Cardiovascular;  Laterality:  N/A;       History of present illness and  Hospital Course:     Kindly see H&P for history of present illness and admission details, please review complete Labs, Consult reports and Test reports for all details in brief  HPI  from the history and physical done on the day of admission  Jo Lynn is a 69 yo black female with a history of ischemic cardiomyopathy, STEMI, hypertension, and diabetes mellitus type 2. She is s/p ICD (12/15) and CABG (2004). Pt presents with increased lower extremity edema, orthopnea, and dyspnea on exertion for the past 1 week. Progressively worsened white productive cough since December when she was admitted and treated for pneumonia. Pt states she also has infrequent posttussive emesis, but denies seeing any blood. She states that she takes her medications as prescribed. Increased right ankle pain for which she saw her cardiologist completed an ABI and showed no ischemia.   Pt still complains of lower leg edema, orthopnea, dyspnea on exertion and productive. Denies current N/V. Denies chest pain, wheezing, abdominal pain. States she drinks 48oz of diet coke each day at home along with water and juice.    Hospital Course    Acute on Chronic Systolic and Diastolic Heart Failure  --Echo on 12/28/2013 showed a dilated LV and ejection fraction of 25% with grade 2 diastolic dysfunction - Much improved after IV Lasix, no shortness of breath or oxygen demand now, still has mild edema, home dose Lasix adjusted and Zaroxolyn added, request PCP to monitor electrolytes weight and diuretic dose closely. Outpatient cardiology follow-up recommended. -Continue ASA 81mg  daily with Plavix 75mg  daily along with beta blocker.  --Fluid restriction education given, daughter updated. Diuresed 8-1/2 L so far.   Posttussive emesis -completely resolved -Abdominal Xray on 2/22 shows diffuse calcifications in the pelvis -Abdominal US on 2/22 shows stable appearance of  cholelithiasis , HIDA unremarkable outpatient follow-up with Gen. surgery for gallstones.      Diabetes mellitus, type 2, uncontrolled -Continue Home rx            Hypertension  - Stable on beta blocker , ARB held due to ARF, monitor renal function on Diuretics.     ARF on Chronic Kidney Disease III -Her baseline creatinine appears to be close to 2, diuretics per cardiology, hold ACE/ARB and nephrotoxins. Monitor creatinine. Still has evidence of fluid overload. Once creatinine stable resume ACE/ARB.     Elevated LFT and lipase -AST 52; ALT 67; Lipase 73; this is a slight change from yesterday's results of AST 48; ALT 60; Lipase 106 -From heart failure and hepatic congestion. HIDA scan unremarkable. Repeat CMP by PCP in 1 week.    Right ankle pain -Complaint of increased right ankle pain; saw cardiologist and ABI was negative (03/11/2014) -Because it elevated, x-ray shows mild soft tissue edema this is consistent with acute gout. Improved after 1 dose of IV Solu-Medrol ,  placed on Colchicine, she is better. Outpt ortho follow up          Discharge Condition: Stable   Follow UP  Follow-up Information    Follow up with Chevis Pretty, FNP. Schedule an appointment as soon as possible for a visit in 1 week.   Specialty:  Nurse Practitioner   Contact information:   [place] [address] [phone]       Follow up with MATTINGLY,MICHAEL T, MD. Schedule an appointment as soon as possible for a visit in 1 week.   Specialty:  Nephrology   Why:  CKD   Contact information:   [place] [address] [phone]       Follow up with [place]. Schedule an appointment as soon as possible for a visit in 1 week.   Why:  CHF   Contact information:   [place] [phone]       Follow up with Chevis Pretty, FNP. Schedule an appointment as soon as possible for a visit in 1  week.   Specialty:  Nurse Practitioner   Contact information:   [place] [address] [phone]       Follow up with DUDA,MARCUS V, MD. Schedule an appointment as soon as possible for a visit in 1 week.   Specialty:  Orthopedic Surgery   Why:  R foot pain   Contact information:   [place] [address] [phone]         Discharge Instructions  and  Discharge Medications     Discharge Instructions    Discharge instructions    Complete by:  As directed   Follow with Primary MD Chevis Pretty, FNP in 7 days   Get CBC, CMP, 2 view Chest X ray checked  by Primary MD next visit.    Activity: As tolerated with Full fall precautions use walker/cane & assistance as needed   Disposition Home     Diet: Heart Healthy Low carb.  Check your Weight same time everyday, if you gain over 2 pounds, or you develop in leg swelling, experience more shortness of breath or chest pain, call your Primary MD immediately. Follow Cardiac Low Salt Diet and 1.5 lit/day fluid restriction.   On your next visit with your primary care physician please Get Medicines reviewed and adjusted.   Please request your Prim.MD to go over all Hospital Tests and Procedure/Radiological results at the follow up, please get all Hospital records sent to your Prim MD by signing hospital release before you go home.   If you experience worsening of your admission symptoms, develop shortness of breath, life threatening emergency, suicidal or homicidal thoughts you must seek medical attention immediately by calling 911 or calling your MD immediately  if symptoms less severe.  You Must read complete instructions/literature along with all the possible adverse reactions/side effects for all the Medicines you take and that have been prescribed to you. Take any new Medicines after you have completely understood and accpet all the possible adverse reactions/side effects.   Do  not drive, operating heavy machinery, perform activities at heights, swimming or participation in water activities or provide baby sitting services if your were admitted for syncope or siezures until you have seen by Primary MD or a Neurologist and advised to do so again.  Do not drive when taking Pain medications.    Do not take more than prescribed Pain,  Sleep and Anxiety Medications  Special Instructions: If you have smoked or chewed Tobacco  in the last 2 yrs please stop smoking, stop any regular Alcohol  and or any Recreational drug use.  Wear Seat belts while driving.   Please note  You were cared for by a hospitalist during your hospital stay. If you have any questions about your discharge medications or the care you received while you were in the hospital after you are discharged, you can call the unit and asked to speak with the hospitalist on call if the hospitalist that took care of you is not available. Once you are discharged, your primary care physician will handle any further medical issues. Please note that NO REFILLS for any discharge medications will be authorized once you are discharged, as it is imperative that you return to your primary care physician (or establish a relationship with a primary care physician if you do not have one) for your aftercare needs so that they can reassess your need for medications and monitor your lab values.                                                       Jo Lynn was admitted to the Hospital on 04/18/2014 and Discharged  04/22/2014 and should be excused from work/school   for 5  days starting 04/18/2014 , may return to work/school without any restrictions.  Call Lala Lund MD, [facility]  [phone] with questions.  Thurnell Lose M.D on 04/22/2014,at 8:47 AM  [facility]   Office  [phone]     Increase activity slowly    Complete by:  As directed             Medication List    STOP taking  these medications        losartan 50 MG tablet  Commonly known as:  COZAAR      TAKE these medications        aspirin 81 MG EC tablet  Take 1 tablet (81 mg total) by mouth daily.     benzonatate 100 MG capsule  Commonly known as:  TESSALON  Take 1 capsule (100 mg total) by mouth 2 (two) times daily as needed for cough.     clopidogrel 75 MG tablet  Commonly known as:  PLAVIX  Take 1 tablet (75 mg total) by mouth daily.     colchicine 0.6 MG tablet  Take 1 tablet (0.6 mg total) by mouth daily.     diclofenac sodium 1 % Gel  Commonly known as:  VOLTAREN  Apply 2 g topically 4 (four) times daily.     furosemide 40 MG tablet  Commonly known as:  LASIX  Take 1 tablet (40 mg total) by mouth 2 (two) times daily.     metolazone 2.5 MG tablet  Commonly known as:  ZAROXOLYN  Take 1 tablet (2.5 mg total) by mouth every Saturday.     metoprolol succinate 25 MG 24 hr tablet  Commonly known as:  TOPROL-XL  Take 1 tablet (25 mg total) by mouth 2 (two) times daily. Take with or immediately following a meal.     nitroGLYCERIN 0.4 MG SL tablet  Commonly known as:  NITROSTAT  Place 1 tablet (0.4 mg total) under the tongue every 5 (five) minutes as needed for chest pain.  Potassium Chloride ER 20 MEQ Tbcr  Take 20 mEq by mouth daily.     sitaGLIPtin 100 MG tablet  Commonly known as:  JANUVIA  Take 1 tablet (100 mg total) by mouth daily.          Diet and Activity recommendation: See Discharge Instructions above   Consults obtained -  None   Major procedures and Radiology Reports - PLEASE review detailed and final reports for all details, in brief -     Dg Chest 2 View  04/18/2014   CLINICAL DATA:  Cough for 1 week.  EXAM: CHEST  2 VIEW  COMPARISON:  02/22/2014  FINDINGS: Left single lead pacer tip is in the right ventricle, unchanged. Prior CABG. Mild cardiomegaly. No confluent opacities or effusions. No acute bony abnormality. Degenerative changes in the thoracic  spine.  IMPRESSION: Cardiomegaly.  No active disease.   Electronically Signed   By: Rolm Baptise M.D.   On: 04/18/2014 17:46   Dg Ankle 2 Views Right  04/19/2014   CLINICAL DATA:  Subacute right ankle pain and swelling. Initial encounter.  EXAM: RIGHT ANKLE - 2 VIEW  COMPARISON:  03/25/2014  FINDINGS: Soft tissue swelling is noted.  There is no evidence of acute fracture, subluxation or dislocation.  A small calcaneal spur is present.  No focal bony lesions are present.  IMPRESSION: Soft tissue swelling without acute bony abnormality.   Electronically Signed   By: Margarette Canada M.D.   On: 04/19/2014 15:07   Dg Ankle Complete Right  03/25/2014   CLINICAL DATA:  Chronic right ankle pain.  No known injuries.  EXAM: RIGHT ANKLE - COMPLETE 3+ VIEW  COMPARISON:  None.  FINDINGS: No evidence of acute or subacute fracture or dislocation. Ankle mortise intact with well preserved joint space. Bone mineral density well-preserved for age. Small ankle joint effusion. Atherosclerotic calcification of the posterior tibial artery. Numerous phleboliths in the subcutaneous tissues of the lower leg. Calcification in the distal Achilles tendon.  IMPRESSION: No acute or significant osseous abnormality. Small ankle joint effusion.   Electronically Signed   By: Evangeline Dakin M.D.   On: 03/25/2014 15:55   Nm Hepatobiliary Liver Func  04/19/2014   CLINICAL DATA:  Abdominal pain with nausea and vomiting.  EXAM: NUCLEAR MEDICINE HEPATOBILIARY IMAGING WITH GALLBLADDER EF  TECHNIQUE: Sequential images of the abdomen were obtained out to 60 minutes following intravenous administration of radiopharmaceutical. After slow intravenous infusion of 1.7 micrograms Cholecystokinin, gallbladder ejection fraction was determined.  RADIOPHARMACEUTICALS:  5 [place] SN-05L Choletec  COMPARISON:  02/16/2014 hepatobiliary scan. 04/18/2014 abdominal ultrasound.  FINDINGS: There is prompt radiotracer uptake by the liver with progressive excretion  into the biliary system. The gallbladder and small bowel activity are both identified at around 20-25 minutes. After administration of CCK, there was only mild expulsion of radiotracer from the gallbladder, with a calculated ejection fraction of 26% at 45 minutes (66% ejection fraction at 30 minutes on the prior study). At 45 min, normal ejection fraction is greater than 40%.  The patient did not experience symptoms during CCK infusion.  IMPRESSION: 1. Patent cystic duct and common bile duct. No evidence of acute cholecystitis. 2. Diminished gallbladder ejection fraction, nonspecific but may reflect chronic cholecystitis/ biliary dyskinesia.   Electronically Signed   By: Logan Bores   On: 04/19/2014 17:00   US Abdomen Complete  04/18/2014   CLINICAL DATA:  Abdominal pain and vomiting.  EXAM: ULTRASOUND ABDOMEN COMPLETE  COMPARISON:  Prior abdominal ultrasound on  02/16/2014  FINDINGS: Gallbladder: Stable appearance of cholelithiasis. The gallbladder is not distended. Stable top-normal wall thickness. No sonographic Murphy's sign.  Common bile duct: Diameter: 7 mm in maximum measured diameter. No visualized calculi in the common bile duct by ultrasound.  Liver: No focal lesion identified. Within normal limits in parenchymal echogenicity. No evidence of intrahepatic biliary ductal dilatation.  IVC: No abnormality visualized.  Pancreas: The pancreas is not visualized by ultrasound.  Spleen: Size and appearance within normal limits.  Right Kidney: Length: 12.3 cm. No hydronephrosis. Hyperechoic focus in the lower pole may represent a nonobstructing calculus.  Left Kidney: Length: 11.0 cm. No hydronephrosis. Stable simple cyst of the upper pole measures 6.3 cm in greatest diameter.  Abdominal aorta: No aneurysm visualized.  Other findings: None.  IMPRESSION: Stable appearance of cholelithiasis. Mildly prominent gallbladder wall thickness is similar to the prior study and may relate to under distention. No overt signs  of cholecystitis or biliary obstruction.   Electronically Signed   By: Aletta Edouard M.D.   On: 04/18/2014 21:05   Dg Abd 2 Views  04/18/2014   CLINICAL DATA:  Vomiting for 1 week.  Epigastric pain for 1 week.  EXAM: ABDOMEN - 2 VIEW  COMPARISON:  None.  FINDINGS: Nonobstructive bowel gas pattern.  No free air organomegaly.  A mortise calcifications noted within the Mid and left pelvis. These are of unknown etiology. These appear too diffuse to represent calcified fibroids in may be peritoneal. May consider abdomen and pelvic CT for further evaluation if felt clinically indicated.  IMPRESSION: Extensive calcifications in the pelvis of unknown etiology, which appear too diffuse to represent calcified fibroids. Question peritoneal calcifications. These could be further evaluated with CT if felt clinically indicated.  No evidence of bowel obstruction or free air.   Electronically Signed   By: Rolm Baptise M.D.   On: 04/18/2014 17:50     Micro Results      No results found for this or any previous visit (from the past 240 hour(s)).     Today   Subjective:   Jo Lynn today has no headache,no chest abdominal pain,no new weakness tingling or numbness, feels much better wants to go home today.    Objective:   Blood pressure 131/89, pulse 82, temperature 98.2 F (36.8 C), temperature source Oral, resp. rate 20, height 5\' 7"  (1.702 m), weight 82.1 kg (181 lb), SpO2 97 %.   Intake/Output Summary (Last 24 hours) at 04/22/14 1020 Last data filed at 04/22/14 0934  Gross per 24 hour  Intake   1200 ml  Output   1600 ml  Net   -400 ml    Exam  Awake Alert, Oriented x 3, No new F.N deficits, Normal affect [place].AT,PERRAL Supple Neck,No JVD, No cervical lymphadenopathy appriciated.  Symmetrical Chest wall movement, Good air movement bilaterally, CTAB RRR,No Gallops,Rubs or new Murmurs, No Parasternal Heave +ve B.Sounds, Abd Soft, Non tender, No organomegaly appriciated, No rebound  -guarding or rigidity. No Cyanosis, Clubbing or edema, No new Rash or bruise   Data Review   CBC w Diff: Lab Results  Component Value Date   WBC 8.6 04/19/2014   HGB 12.6 04/19/2014   HCT 38.6 04/19/2014   PLT 132* 04/19/2014   LYMPHOPCT 32 04/19/2014   MONOPCT 7 04/19/2014   EOSPCT 1 04/19/2014   BASOPCT 0 04/19/2014    CMP: Lab Results  Component Value Date   NA 137 04/22/2014   NA 145* 01/10/2014   K 3.7  04/22/2014   CL 104 04/22/2014   CO2 26 04/22/2014   BUN 50* 04/22/2014   BUN 26 01/10/2014   CREATININE 1.80* 04/22/2014   CREATININE 1.99* 03/31/2014   PROT 6.8 04/19/2014   PROT 6.7 01/10/2014   ALBUMIN 3.4* 04/19/2014   BILITOT 1.8* 04/19/2014   ALKPHOS 107 04/19/2014   AST 52* 04/19/2014   ALT 67* 04/19/2014  .   Total Time in preparing paper work, data evaluation and todays exam - 35 minutes  Thurnell Lose M.D on 04/22/2014 at 10:20 AM  [facility]   Office  [phone]

## 2014-04-22 NOTE — Progress Notes (Signed)
Nutrition Education Note  RD consulted for nutrition education regarding Low Sodium diet.   Lab Results  Component Value Date   HGBA1C 5.8* 04/19/2014    RD provided "Low Sodium Nutrition Therapy" handout from the Academy of [employer] and [employer]etics. Reviewed patient's dietary recall. Provided examples on ways to decrease sodium intake in diet. Discouraged intake of processed foods and use of salt shaker. Encouraged fresh fruits and vegetables as well as whole grain sources of carbohydrates to maximize fiber intake. Discussed tips for reading nutrition labels.   RD discussed why it is important for patient to adhere to diet recommendations, and emphasized the role of fluids, foods to avoid, and importance of weighing self daily. Teach back method used.  Expect good compliance.  Body mass index is 28.34 kg/(m^2). Pt meets criteria for Overweight based on current BMI.  Current diet order is Heart Healthy, patient is consuming approximately 50-100% of meals at this time. Labs and medications reviewed. No further nutrition interventions warranted at this time. RD contact information provided. If additional nutrition issues arise, please re-consult RD.   Pryor Ochoa RD, LDN Inpatient Clinical Dietitian Pager: [phone] After Hours Pager: [phone]

## 2014-04-28 ENCOUNTER — Encounter: Payer: Self-pay | Admitting: Nurse Practitioner

## 2014-04-28 ENCOUNTER — Ambulatory Visit (INDEPENDENT_AMBULATORY_CARE_PROVIDER_SITE_OTHER): Payer: Medicare Other | Admitting: Nurse Practitioner

## 2014-04-28 VITALS — BP 97/59 | HR 64 | Temp 97.0°F | Ht 67.0 in | Wt 173.0 lb

## 2014-04-28 DIAGNOSIS — I5023 Acute on chronic systolic (congestive) heart failure: Secondary | ICD-10-CM

## 2014-04-28 DIAGNOSIS — N183 Chronic kidney disease, stage 3 unspecified: Secondary | ICD-10-CM

## 2014-04-28 DIAGNOSIS — J309 Allergic rhinitis, unspecified: Secondary | ICD-10-CM | POA: Diagnosis not present

## 2014-04-28 DIAGNOSIS — I255 Ischemic cardiomyopathy: Secondary | ICD-10-CM | POA: Diagnosis not present

## 2014-04-28 DIAGNOSIS — Z09 Encounter for follow-up examination after completed treatment for conditions other than malignant neoplasm: Secondary | ICD-10-CM

## 2014-04-28 MED ORDER — FLUTICASONE PROPIONATE 50 MCG/ACT NA SUSP: 2.0000 | Freq: Every day | NASAL | Status: DC

## 2014-04-28 NOTE — Patient Instructions (Signed)

## 2014-04-28 NOTE — Progress Notes (Signed)
   Subjective:    Patient ID: Jo Lynn, female    DOB: 09/06/1945, 69 y.o.   MRN: [id number]. HPI Patient here today for hospital follow up- [facility] was out in hospital with CHF- she was getting SOB with foot swelling and her daughter took her to ER. SHe was in the hospital for over a week- She was also found to have gout in her right ankle while she was in hospital. She has appointment with nephrologist and podiatrist. [facility] is limited to drinking 1 1/2 liter daily. Gradually getting her energy back.    Review of Systems  Constitutional: Negative.   HENT: Negative.   Respiratory: Negative.   Cardiovascular: Negative.   Gastrointestinal: Negative.   Genitourinary: Negative.   Neurological: Negative.   Psychiatric/Behavioral: Negative.   All other systems reviewed and are negative.      Objective:   Physical Exam  Constitutional: She is oriented to person, place, and time. She appears well-developed and well-nourished.  HENT:  Right Ear: External ear normal.  Left Ear: External ear normal.  Nose: Nose normal.  Mouth/Throat: Oropharynx is clear and moist.  Eyes: Pupils are equal, round, and reactive to light.  Neck: Normal range of motion. Neck supple.  Cardiovascular: Normal rate, regular rhythm and normal heart sounds.   Pulmonary/Chest: Effort normal and breath sounds normal.  Abdominal: Soft. Bowel sounds are normal.  Musculoskeletal: She exhibits no edema.  Neurological: She is alert and oriented to person, place, and time.  Skin: Skin is warm and dry.  Psychiatric: She has a normal mood and affect. Her behavior is normal. Judgment and thought content normal.   BP 97/59 mmHg  Pulse 64  Temp(Src) 97 F (36.1 C) (Oral)  Ht 5\' 7"  (1.702 m)  Wt 173 lb (78.472 kg)  BMI 27.09 kg/m2        Assessment & Plan:  1. Hospital discharge follow-up Reviewed hospital records  2. CKD (chronic kidney disease) stage 3, GFR 30-59 ml/min  3. Acute on chronic systolic CHF  (congestive heart failure) Continue to limit fluid intake  4. Allergic rhinitis, unspecified allergic rhinitis type - fluticasone (FLONASE) 50 MCG/ACT nasal spray; Place 2 sprays into both nostrils daily.  Dispense: 16 g; Refill: [place], FNP

## 2014-05-03 ENCOUNTER — Other Ambulatory Visit: Payer: Self-pay | Admitting: Internal Medicine

## 2014-05-06 ENCOUNTER — Telehealth: Payer: Self-pay | Admitting: Internal Medicine

## 2014-05-06 ENCOUNTER — Other Ambulatory Visit: Payer: Self-pay | Admitting: *Deleted

## 2014-05-06 ENCOUNTER — Telehealth: Payer: Self-pay | Admitting: Nurse Practitioner

## 2014-05-06 MED ORDER — COLCHICINE 0.6 MG PO TABS: 0.6000 mg | ORAL_TABLET | Freq: Every day | ORAL | Status: DC

## 2014-05-06 NOTE — Telephone Encounter (Signed)
Pt aware rx sent to the pharmacy. 

## 2014-05-06 NOTE — Telephone Encounter (Signed)
LMOVM for pt to return call 

## 2014-05-06 NOTE — Telephone Encounter (Signed)
New Msg        Pt will be going out of town, are there any restrictions as far as her device?   Does the device get monitored daily?   Please call and advise.

## 2014-05-06 NOTE — Telephone Encounter (Signed)
Colchicine rx sent to pharamcy

## 2014-05-09 ENCOUNTER — Ambulatory Visit: Payer: BLUE CROSS/BLUE SHIELD | Admitting: Cardiology

## 2014-05-10 NOTE — Telephone Encounter (Signed)
2nd attempt to reach pt//kwm

## 2014-05-11 ENCOUNTER — Other Ambulatory Visit (INDEPENDENT_AMBULATORY_CARE_PROVIDER_SITE_OTHER): Payer: Medicare Other

## 2014-05-11 ENCOUNTER — Encounter: Payer: Self-pay | Admitting: Cardiology

## 2014-05-11 ENCOUNTER — Ambulatory Visit (INDEPENDENT_AMBULATORY_CARE_PROVIDER_SITE_OTHER): Payer: BLUE CROSS/BLUE SHIELD | Admitting: Cardiology

## 2014-05-11 VITALS — BP 80/58 | HR 64 | Ht 67.0 in | Wt 172.0 lb

## 2014-05-11 DIAGNOSIS — I255 Ischemic cardiomyopathy: Secondary | ICD-10-CM

## 2014-05-11 DIAGNOSIS — I10 Essential (primary) hypertension: Secondary | ICD-10-CM | POA: Diagnosis not present

## 2014-05-11 DIAGNOSIS — Z79899 Other long term (current) drug therapy: Secondary | ICD-10-CM

## 2014-05-11 NOTE — Progress Notes (Signed)
HPI The patient presents for follow up after an acute inferior myocardial infarction with 2 bare-metal stents to a vein graft to obtuse marginal 1 and obtuse marginal 2 in July 2015. Marland Kitchen She finally agreed to have an ICD and she had this procedure done several weeks ago. A few weeks prior to that she was actually in the hospital with pneumonia.  Now she returns for follow-up and was again in the hospital. This time with volume overload. I again reviewed these records. Of note she's been complaining of foot pain and was diagnosed with gout during that admission. She was diureses. Now she is weighing herself daily and watching her salt better. We have gone over these instructions before but she had obviously not been compliant.  Now she says she is breathing better. She's not having any new shortness of breath. She's not having the leg swelling that she was having. She's having no PND or orthopnea. She's having no chest pressure, neck or arm discomfort.   No Known Allergies  Current Outpatient Prescriptions  Medication Sig Dispense Refill  . aspirin EC 81 MG EC tablet Take 1 tablet (81 mg total) by mouth daily.    . benzonatate (TESSALON) 100 MG capsule Take 1 capsule (100 mg total) by mouth 2 (two) times daily as needed for cough. 20 capsule 0  . clopidogrel (PLAVIX) 75 MG tablet Take 1 tablet (75 mg total) by mouth daily. 90 tablet 3  . colchicine 0.6 MG tablet Take 1 tablet (0.6 mg total) by mouth daily. 10 tablet 0  . diclofenac sodium (VOLTAREN) 1 % GEL Apply 2 g topically 4 (four) times daily. (Patient taking differently: Apply 2 g topically 4 (four) times daily as needed (pain). ) 5 Tube 3  . fluticasone (FLONASE) 50 MCG/ACT nasal spray Place 2 sprays into both nostrils daily. 16 g 6  . furosemide (LASIX) 40 MG tablet Take 1 tablet (40 mg total) by mouth 2 (two) times daily. 60 tablet 0  . KLOR-CON M20 20 MEQ tablet Take 20 mEq by mouth once.     Marland Kitchen losartan (COZAAR) 50 MG tablet Take 50 mg by  mouth daily.     . metolazone (ZAROXOLYN) 2.5 MG tablet Take 1 tablet (2.5 mg total) by mouth every Saturday. 10 tablet 0  . metoprolol succinate (TOPROL-XL) 25 MG 24 hr tablet Take 1 tablet (25 mg total) by mouth 2 (two) times daily. Take with or immediately following a meal. 60 tablet 11  . nitroGLYCERIN (NITROSTAT) 0.4 MG SL tablet Place 1 tablet (0.4 mg total) under the tongue every 5 (five) minutes as needed for chest pain. 90 tablet 3  . sitaGLIPtin (JANUVIA) 100 MG tablet Take 1 tablet (100 mg total) by mouth daily. 90 tablet 0   No current facility-administered medications for this visit.    Past Medical History  Diagnosis Date  . Myocardial infarction 10/2002    - MV CAD --> Referred for CABG (no follow-up post CABG)  . 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 Lucianne Lei Trigt: LIMA-LAD, SVG-RPDA, SVG-OM1-OM2  . ST elevation myocardial infarction (STEMI) of inferolateral wall 09/09/2013    SVG-Om1-2 occluded - PCI in 2 locations; Occluded SVG-RCA & native RCA, LAD & native Cx occcluded.  EF ~20%  . Cardiomyopathy, ischemic  09/09/2013    EF ~20-25%; Large MI, existing occluded RCA & SVG-RCA; h/o CABG  . 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 CAD S/P percutaneous coronary angioplasty 09/09/2013    mid SVG-OM1-OM2 (prox LIMB) Rebel BMS 4.0 mm x 16 mm; sequential limb OM1-OM2: Rebel BMS 4.0 mm x 12 mm  . Combined systolic and diastolic congestive heart failure, NYHA class 3 09/09/2013    Echo post Inferolateral STEMI: Severely reduced LVEF ~20-35%; Akinesis of Inferolateral, Inferior & Inferoseptal walls; Grade 1 DDysfxn (initial LVEDP ~26  mmHg with SBP of 99 mHg)  . Essential hypertension 08/17/2012  . Hyperlipidemia with target LDL less than 70 08/17/2012  . Diabetes mellitus type 2 with complications 02/26/7508  . AICD (automatic cardioverter/defibrillator) present 02/21/2014    single chamber  by dr taylor    Past Surgical History  Procedure Laterality Date  . Coronary artery bypass graft    . Left heart catheterization with coronary angiogram N/A 09/09/2013    Procedure: LEFT HEART CATHETERIZATION WITH CORONARY ANGIOGRAM;  Surgeon: Leonie Man, MD;  Location: [facility] CATH LAB;  Service: Cardiovascular;  Laterality: N/A;  . Implantable cardioverter defibrillator implant  02-21-2014    STJ single chamber ICD implanted by Dr Lovena Le for primary prevention  . Implantable cardioverter defibrillator implant N/A 02/21/2014    Procedure: IMPLANTABLE CARDIOVERTER DEFIBRILLATOR IMPLANT;  Surgeon: Evans Lance, MD;  Location: [facility] CATH LAB;  Service: Cardiovascular;  Laterality: N/A;    ROS:  As stated in the HPI and negative for all other systems.   PHYSICAL EXAM BP 80/58 mmHg  Pulse 64  Ht 5\' 7"  (1.702 m)  Wt 172 lb (78.019 kg)  BMI 26.93 kg/m2 GENERAL:  Well appearing HEENT:  Pupils equal round and reactive, fundi not visualized, oral mucosa unremarkable NECK:  No jugular venous distention, waveform within normal limits, carotid upstroke brisk and symmetric, no bruits, no thyromegaly LUNGS:  Clear to auscultation bilaterally CHEST:  ICD scar intact HEART:  PMI not displaced or sustained,S1 and S2 within normal limits, no S3, no S4, no clicks, no rubs, no murmurs ABD:  Flat, positive bowel sounds normal in frequency in pitch, no bruits, no rebound, no guarding, no midline pulsatile mass, no hepatomegaly, no splenomegaly EXT:  2 plus pulses throughout, mild ankle edema, no cyanosis no clubbing SKIN:  No rashes no nodules NEURO:  Cranial nerves II - XII intact  ASSESSMENT AND PLAN  CAD:   She is having no new  cardiovascular symptoms.  No further work up is planned.    CARDIOMYOPATHY:   She is now status post ICD. She seems to be euvolemic. She will continue the meds as listed. Because of her renal insufficiency I will check a basic metabolic profile.  CKD:   We will keep a close eye on this..  MR:  Moderate.  We will follow this clinically.   It was moderate on the echo in February when she was hospitalized   LEG PAIN:    She is now diagnosed with gout.  Feb hospital records reviewed.

## 2014-05-11 NOTE — Patient Instructions (Signed)
The current medical regimen is effective;  continue present plan and medications.  Please have blood work today at [facility]  (BMP)  Follow up in 6 weeks with Dr Percival Spanish in [place].  Thank you for choosing [facility]!!

## 2014-05-11 NOTE — Progress Notes (Signed)
Labwork for Dr Percival Spanish

## 2014-05-12 ENCOUNTER — Telehealth: Payer: Self-pay | Admitting: Cardiology

## 2014-05-12 ENCOUNTER — Telehealth: Payer: Self-pay | Admitting: *Deleted

## 2014-05-12 ENCOUNTER — Ambulatory Visit: Payer: BLUE CROSS/BLUE SHIELD | Admitting: Cardiology

## 2014-05-12 ENCOUNTER — Other Ambulatory Visit (INDEPENDENT_AMBULATORY_CARE_PROVIDER_SITE_OTHER): Payer: Medicare Other

## 2014-05-12 DIAGNOSIS — R899 Unspecified abnormal finding in specimens from other organs, systems and tissues: Secondary | ICD-10-CM

## 2014-05-12 LAB — BMP8+EGFR
BUN/Creatinine Ratio: 38 — ABNORMAL HIGH (ref 11–26)
BUN: 93 mg/dL (ref 8–27)
CO2: 23 mmol/L (ref 18–29)
CREATININE: 2.48 mg/dL — AB (ref 0.57–1.00)
Calcium: 10.3 mg/dL (ref 8.7–10.3)
Chloride: 94 mmol/L — ABNORMAL LOW (ref 97–108)
GFR, EST AFRICAN AMERICAN: 22 mL/min/{1.73_m2} — AB (ref >59)
GFR, EST NON AFRICAN AMERICAN: 19 mL/min/{1.73_m2} — AB (ref >59)
Glucose: 315 mg/dL — ABNORMAL HIGH (ref 65–99)
POTASSIUM: 6.8 mmol/L — AB (ref 3.5–5.2)
Sodium: 136 mmol/L (ref 134–144)

## 2014-05-12 LAB — HM DIABETES EYE EXAM

## 2014-05-12 NOTE — Telephone Encounter (Signed)
Left message for pt to CB regarding lab results, her potassium level was quite high and we need her to come in today to have it redrawn per Ovid Curd (triage at Dr.Hochreins office).

## 2014-05-12 NOTE — Telephone Encounter (Signed)
Results checked w/ Dr. Percival Spanish. He advised redraw based on potassium. Called Carla back at [employer], advised of this. She voiced understanding, will call pt & request her to present for re-draw of bloodwork.

## 2014-05-12 NOTE — Telephone Encounter (Signed)
Note results already in EPIC and Dr. Percival Spanish has commented on findings. Encounter closed.

## 2014-05-12 NOTE — Telephone Encounter (Signed)
Fax pending from [place].

## 2014-05-12 NOTE — Progress Notes (Signed)
Lab only 

## 2014-05-13 LAB — BMP8+EGFR
BUN/Creatinine Ratio: 37 — ABNORMAL HIGH (ref 11–26)
BUN: 86 mg/dL — AB (ref 8–27)
CALCIUM: 10.3 mg/dL (ref 8.7–10.3)
CHLORIDE: 96 mmol/L — AB (ref 97–108)
CO2: 22 mmol/L (ref 18–29)
Creatinine, Ser: 2.32 mg/dL — ABNORMAL HIGH (ref 0.57–1.00)
GFR calc Af Amer: 24 mL/min/{1.73_m2} — ABNORMAL LOW (ref >59)
GFR calc non Af Amer: 21 mL/min/{1.73_m2} — ABNORMAL LOW (ref >59)
GLUCOSE: 259 mg/dL — AB (ref 65–99)
POTASSIUM: 5.7 mmol/L — AB (ref 3.5–5.2)
SODIUM: 137 mmol/L (ref 134–144)

## 2014-05-16 ENCOUNTER — Telehealth: Payer: Self-pay | Admitting: Cardiology

## 2014-05-16 DIAGNOSIS — Z79899 Other long term (current) drug therapy: Secondary | ICD-10-CM

## 2014-05-16 NOTE — Telephone Encounter (Signed)
She should be off the potassium and remain off.  Potassium was slightly elevated.  She should return for a BMET later this week.  Call Ms. Mateo Flow with the results and send results to Chevis Pretty, FNP

## 2014-05-16 NOTE — Telephone Encounter (Signed)
Spoke with patient and her daughter - patient had labs done last week at PCP office and potassium was elevated.   Patient has not taken potassium supplement for 3 days - stopped on her own.   Daughter states they received a message from a woman (Nicki Guadalajara, RN) requesting patient to have labs 5 days after 1st set of labs last week but then Dr. Percival Spanish wanted STAT BMET, which was drawn (K had decreased).   Unsure if Dr. Percival Spanish wants further lab work done. Will defer to him to advise

## 2014-05-16 NOTE — Telephone Encounter (Signed)
New message      Someone called pt last week telling her she needed to have labs drawn tomorrow.  Is is a repeat lab or a new lab?  Also, lab order needs to be sent to [facility]

## 2014-05-17 NOTE — Telephone Encounter (Signed)
Daughter returned call. Informed her of advice per Dr. Percival Spanish to stay off potassium and recheck BMET later this week. They state they will go to [facility] on Friday for BMET Lab order faxed to [facility] @ Fax: [phone]

## 2014-05-17 NOTE — Telephone Encounter (Signed)
LMTCB

## 2014-05-23 ENCOUNTER — Encounter: Payer: Self-pay | Admitting: *Deleted

## 2014-05-24 ENCOUNTER — Other Ambulatory Visit (INDEPENDENT_AMBULATORY_CARE_PROVIDER_SITE_OTHER): Payer: Medicare Other

## 2014-05-24 DIAGNOSIS — Z79899 Other long term (current) drug therapy: Secondary | ICD-10-CM | POA: Diagnosis not present

## 2014-05-24 NOTE — Progress Notes (Signed)
Lab only 

## 2014-05-25 LAB — BMP8+EGFR
BUN/Creatinine Ratio: 38 — ABNORMAL HIGH (ref 11–26)
BUN: 108 mg/dL — AB (ref 8–27)
CHLORIDE: 96 mmol/L — AB (ref 97–108)
CO2: 22 mmol/L (ref 18–29)
CREATININE: 2.83 mg/dL — AB (ref 0.57–1.00)
Calcium: 10 mg/dL (ref 8.7–10.3)
GFR calc non Af Amer: 16 mL/min/{1.73_m2} — ABNORMAL LOW (ref >59)
GFR, EST AFRICAN AMERICAN: 19 mL/min/{1.73_m2} — AB (ref >59)
GLUCOSE: 126 mg/dL — AB (ref 65–99)
POTASSIUM: 5 mmol/L (ref 3.5–5.2)
SODIUM: 138 mmol/L (ref 134–144)

## 2014-05-26 ENCOUNTER — Telehealth: Payer: Self-pay | Admitting: *Deleted

## 2014-05-26 ENCOUNTER — Telehealth: Payer: Self-pay | Admitting: Nurse Practitioner

## 2014-05-26 DIAGNOSIS — Z79899 Other long term (current) drug therapy: Secondary | ICD-10-CM

## 2014-05-26 NOTE — Telephone Encounter (Signed)
Spoke w/ patient to give her instruction on med chgs & labwork, who asked that I clarify instructions w/ daughter. Did so.  Ordered labs.

## 2014-05-26 NOTE — Telephone Encounter (Signed)
Labwork faxed to [facility].

## 2014-05-26 NOTE — Telephone Encounter (Signed)
Spoke with [employer].  Advised that prednisone may disturb sleep, cause irritability, and may increase appetite.  Advised that her pharmacy would have checked for potential medication interactions but that she can call them to double check.

## 2014-06-01 ENCOUNTER — Other Ambulatory Visit (INDEPENDENT_AMBULATORY_CARE_PROVIDER_SITE_OTHER): Payer: Medicare Other

## 2014-06-01 DIAGNOSIS — R799 Abnormal finding of blood chemistry, unspecified: Secondary | ICD-10-CM | POA: Diagnosis not present

## 2014-06-02 ENCOUNTER — Other Ambulatory Visit: Payer: Self-pay | Admitting: *Deleted

## 2014-06-02 DIAGNOSIS — I10 Essential (primary) hypertension: Secondary | ICD-10-CM

## 2014-06-02 LAB — BMP8+EGFR
BUN/Creatinine Ratio: 50 — ABNORMAL HIGH (ref 11–26)
BUN: 111 mg/dL (ref 8–27)
CO2: 23 mmol/L (ref 18–29)
Calcium: 10 mg/dL (ref 8.7–10.3)
Chloride: 92 mmol/L — ABNORMAL LOW (ref 97–108)
Creatinine, Ser: 2.22 mg/dL — ABNORMAL HIGH (ref 0.57–1.00)
GFR calc Af Amer: 25 mL/min/{1.73_m2} — ABNORMAL LOW (ref >59)
GFR, EST NON AFRICAN AMERICAN: 22 mL/min/{1.73_m2} — AB (ref >59)
Glucose: 308 mg/dL — ABNORMAL HIGH (ref 65–99)
Potassium: 4.8 mmol/L (ref 3.5–5.2)
Sodium: 135 mmol/L (ref 134–144)

## 2014-06-02 MED ORDER — FUROSEMIDE 40 MG PO TABS: 40.0000 mg | ORAL_TABLET | Freq: Two times a day (BID) | ORAL | Status: DC

## 2014-06-10 ENCOUNTER — Encounter (HOSPITAL_COMMUNITY): Payer: Self-pay

## 2014-06-10 ENCOUNTER — Inpatient Hospital Stay (HOSPITAL_COMMUNITY)
Admission: EM | Admit: 2014-06-10 | Discharge: 2014-06-16 | DRG: 981 | Disposition: A | Discharge status: Home / Self Care | Payer: BLUE CROSS/BLUE SHIELD | Attending: Internal Medicine | Admitting: Internal Medicine

## 2014-06-10 ENCOUNTER — Telehealth: Payer: Self-pay | Admitting: *Deleted

## 2014-06-10 DIAGNOSIS — N189 Chronic kidney disease, unspecified: Secondary | ICD-10-CM | POA: Diagnosis not present

## 2014-06-10 DIAGNOSIS — I2510 Atherosclerotic heart disease of native coronary artery without angina pectoris: Secondary | ICD-10-CM | POA: Diagnosis present

## 2014-06-10 DIAGNOSIS — R740 Nonspecific elevation of levels of transaminase and lactic acid dehydrogenase [LDH]: Secondary | ICD-10-CM

## 2014-06-10 DIAGNOSIS — Z951 Presence of aortocoronary bypass graft: Secondary | ICD-10-CM

## 2014-06-10 DIAGNOSIS — N179 Acute kidney failure, unspecified: Secondary | ICD-10-CM | POA: Diagnosis present

## 2014-06-10 DIAGNOSIS — R7401 Elevation of levels of liver transaminase levels: Secondary | ICD-10-CM

## 2014-06-10 DIAGNOSIS — I10 Essential (primary) hypertension: Secondary | ICD-10-CM | POA: Diagnosis not present

## 2014-06-10 DIAGNOSIS — E1165 Type 2 diabetes mellitus with hyperglycemia: Principal | ICD-10-CM | POA: Diagnosis present

## 2014-06-10 DIAGNOSIS — Z7982 Long term (current) use of aspirin: Secondary | ICD-10-CM

## 2014-06-10 DIAGNOSIS — I5042 Chronic combined systolic (congestive) and diastolic (congestive) heart failure: Secondary | ICD-10-CM | POA: Diagnosis present

## 2014-06-10 DIAGNOSIS — I2119 ST elevation (STEMI) myocardial infarction involving other coronary artery of inferior wall: Secondary | ICD-10-CM

## 2014-06-10 DIAGNOSIS — E785 Hyperlipidemia, unspecified: Secondary | ICD-10-CM | POA: Diagnosis present

## 2014-06-10 DIAGNOSIS — M779 Enthesopathy, unspecified: Secondary | ICD-10-CM | POA: Diagnosis present

## 2014-06-10 DIAGNOSIS — I739 Peripheral vascular disease, unspecified: Secondary | ICD-10-CM

## 2014-06-10 DIAGNOSIS — Z87891 Personal history of nicotine dependence: Secondary | ICD-10-CM | POA: Diagnosis not present

## 2014-06-10 DIAGNOSIS — T380X5A Adverse effect of glucocorticoids and synthetic analogues, initial encounter: Secondary | ICD-10-CM | POA: Diagnosis present

## 2014-06-10 DIAGNOSIS — Z7902 Long term (current) use of antithrombotics/antiplatelets: Secondary | ICD-10-CM

## 2014-06-10 DIAGNOSIS — D508 Other iron deficiency anemias: Secondary | ICD-10-CM

## 2014-06-10 DIAGNOSIS — Z95810 Presence of automatic (implantable) cardiac defibrillator: Secondary | ICD-10-CM

## 2014-06-10 DIAGNOSIS — I252 Old myocardial infarction: Secondary | ICD-10-CM | POA: Diagnosis not present

## 2014-06-10 DIAGNOSIS — R0902 Hypoxemia: Secondary | ICD-10-CM

## 2014-06-10 DIAGNOSIS — I25810 Atherosclerosis of coronary artery bypass graft(s) without angina pectoris: Secondary | ICD-10-CM | POA: Diagnosis not present

## 2014-06-10 DIAGNOSIS — N185 Chronic kidney disease, stage 5: Secondary | ICD-10-CM | POA: Diagnosis present

## 2014-06-10 DIAGNOSIS — N184 Chronic kidney disease, stage 4 (severe): Secondary | ICD-10-CM | POA: Diagnosis not present

## 2014-06-10 DIAGNOSIS — I2121 ST elevation (STEMI) myocardial infarction involving left circumflex coronary artery: Secondary | ICD-10-CM | POA: Diagnosis not present

## 2014-06-10 DIAGNOSIS — R627 Adult failure to thrive: Secondary | ICD-10-CM | POA: Diagnosis present

## 2014-06-10 DIAGNOSIS — I255 Ischemic cardiomyopathy: Secondary | ICD-10-CM | POA: Diagnosis present

## 2014-06-10 DIAGNOSIS — I132 Hypertensive heart and chronic kidney disease with heart failure and with stage 5 chronic kidney disease, or end stage renal disease: Secondary | ICD-10-CM | POA: Diagnosis present

## 2014-06-10 DIAGNOSIS — I4891 Unspecified atrial fibrillation: Secondary | ICD-10-CM | POA: Diagnosis not present

## 2014-06-10 DIAGNOSIS — I213 ST elevation (STEMI) myocardial infarction of unspecified site: Secondary | ICD-10-CM | POA: Diagnosis not present

## 2014-06-10 DIAGNOSIS — R739 Hyperglycemia, unspecified: Secondary | ICD-10-CM | POA: Insufficient documentation

## 2014-06-10 DIAGNOSIS — I480 Paroxysmal atrial fibrillation: Secondary | ICD-10-CM | POA: Diagnosis not present

## 2014-06-10 DIAGNOSIS — I5023 Acute on chronic systolic (congestive) heart failure: Secondary | ICD-10-CM

## 2014-06-10 DIAGNOSIS — I959 Hypotension, unspecified: Secondary | ICD-10-CM | POA: Diagnosis not present

## 2014-06-10 DIAGNOSIS — IMO0002 Reserved for concepts with insufficient information to code with codable children: Secondary | ICD-10-CM

## 2014-06-10 DIAGNOSIS — J309 Allergic rhinitis, unspecified: Secondary | ICD-10-CM

## 2014-06-10 DIAGNOSIS — M6588 Other synovitis and tenosynovitis, other site: Secondary | ICD-10-CM | POA: Diagnosis not present

## 2014-06-10 DIAGNOSIS — Z9189 Other specified personal risk factors, not elsewhere classified: Secondary | ICD-10-CM

## 2014-06-10 DIAGNOSIS — I25710 Atherosclerosis of autologous vein coronary artery bypass graft(s) with unstable angina pectoris: Secondary | ICD-10-CM

## 2014-06-10 DIAGNOSIS — I2111 ST elevation (STEMI) myocardial infarction involving right coronary artery: Secondary | ICD-10-CM | POA: Diagnosis not present

## 2014-06-10 DIAGNOSIS — K30 Functional dyspepsia: Secondary | ICD-10-CM | POA: Diagnosis present

## 2014-06-10 DIAGNOSIS — M7751 Other enthesopathy of right foot: Secondary | ICD-10-CM | POA: Diagnosis present

## 2014-06-10 LAB — I-STAT VENOUS BLOOD GAS, ED
Acid-base deficit: 2 mmol/L (ref 0.0–2.0)
BICARBONATE: 23.5 meq/L (ref 20.0–24.0)
O2 Saturation: 33 %
PCO2 VEN: 42.9 mmHg — AB (ref 45.0–50.0)
PH VEN: 7.346 — AB (ref 7.250–7.300)
TCO2: 25 mmol/L (ref 0–100)
pO2, Ven: 22 mmHg — CL (ref 30.0–45.0)

## 2014-06-10 LAB — COMPREHENSIVE METABOLIC PANEL
ALT: 22 U/L (ref 0–35)
ANION GAP: 16 — AB (ref 5–15)
AST: 30 U/L (ref 0–37)
Albumin: 3.5 g/dL (ref 3.5–5.2)
Alkaline Phosphatase: 80 U/L (ref 39–117)
BUN: 162 mg/dL — AB (ref 6–23)
CHLORIDE: 93 mmol/L — AB (ref 96–112)
CO2: 19 mmol/L (ref 19–32)
Calcium: 9.4 mg/dL (ref 8.4–10.5)
Creatinine, Ser: 4.17 mg/dL — ABNORMAL HIGH (ref 0.50–1.10)
GFR calc non Af Amer: 10 mL/min — ABNORMAL LOW (ref >90)
GFR, EST AFRICAN AMERICAN: 11 mL/min — AB (ref >90)
Glucose, Bld: 481 mg/dL — ABNORMAL HIGH (ref 70–99)
Sodium: 128 mmol/L — ABNORMAL LOW (ref 135–145)
Total Bilirubin: 1.9 mg/dL — ABNORMAL HIGH (ref 0.3–1.2)
Total Protein: 7.2 g/dL (ref 6.0–8.3)

## 2014-06-10 LAB — CBC WITH DIFFERENTIAL/PLATELET
BASOS ABS: 0 10*3/uL (ref 0.0–0.1)
Basophils Relative: 0 % (ref 0–1)
EOS ABS: 0 10*3/uL (ref 0.0–0.7)
Eosinophils Relative: 0 % (ref 0–5)
HCT: 37.7 % (ref 36.0–46.0)
Hemoglobin: 12.8 g/dL (ref 12.0–15.0)
LYMPHS PCT: 6 % — AB (ref 12–46)
Lymphs Abs: 0.9 10*3/uL (ref 0.7–4.0)
MCH: 29.2 pg (ref 26.0–34.0)
MCHC: 34 g/dL (ref 30.0–36.0)
MCV: 86.1 fL (ref 78.0–100.0)
Monocytes Absolute: 0.5 10*3/uL (ref 0.1–1.0)
Monocytes Relative: 3 % (ref 3–12)
Neutro Abs: 12.4 10*3/uL — ABNORMAL HIGH (ref 1.7–7.7)
Neutrophils Relative %: 91 % — ABNORMAL HIGH (ref 43–77)
PLATELETS: 150 10*3/uL (ref 150–400)
RBC: 4.38 MIL/uL (ref 3.87–5.11)
RDW: 15.6 % — AB (ref 11.5–15.5)
WBC: 13.8 10*3/uL — AB (ref 4.0–10.5)

## 2014-06-10 LAB — URINALYSIS, ROUTINE W REFLEX MICROSCOPIC
Bilirubin Urine: NEGATIVE
GLUCOSE, UA: 500 mg/dL — AB
Hgb urine dipstick: NEGATIVE
Ketones, ur: NEGATIVE mg/dL
LEUKOCYTES UA: NEGATIVE
NITRITE: NEGATIVE
PROTEIN: NEGATIVE mg/dL
Specific Gravity, Urine: 1.014 (ref 1.005–1.030)
UROBILINOGEN UA: 0.2 mg/dL (ref 0.0–1.0)
pH: 5 (ref 5.0–8.0)

## 2014-06-10 LAB — I-STAT CHEM 8, ED
BUN: >140 mg/dL — ABNORMAL HIGH (ref 6–23)
BUN: >140 mg/dL — ABNORMAL HIGH (ref 6–23)
CREATININE: 3.7 mg/dL — AB (ref 0.50–1.10)
Calcium, Ion: 1.07 mmol/L — ABNORMAL LOW (ref 1.13–1.30)
Calcium, Ion: 1.12 mmol/L — ABNORMAL LOW (ref 1.13–1.30)
Chloride: 98 mmol/L (ref 96–112)
Chloride: 101 mmol/L (ref 96–112)
Creatinine, Ser: 4 mg/dL — ABNORMAL HIGH (ref 0.50–1.10)
Glucose, Bld: 489 mg/dL — ABNORMAL HIGH (ref 70–99)
Glucose, Bld: 477 mg/dL — ABNORMAL HIGH (ref 70–99)
HCT: 43 % (ref 36.0–46.0)
HCT: 37 % (ref 36.0–46.0)
Hemoglobin: 14.6 g/dL (ref 12.0–15.0)
Hemoglobin: 12.6 g/dL (ref 12.0–15.0)
POTASSIUM: 5.1 mmol/L (ref 3.5–5.1)
Potassium: 7.5 mmol/L (ref 3.5–5.1)
Sodium: 127 mmol/L — ABNORMAL LOW (ref 135–145)
Sodium: 130 mmol/L — ABNORMAL LOW (ref 135–145)
TCO2: 21 mmol/L (ref 0–100)
TCO2: 17 mmol/L (ref 0–100)

## 2014-06-10 LAB — BASIC METABOLIC PANEL
Anion gap: 13 (ref 5–15)
BUN: 153 mg/dL — ABNORMAL HIGH (ref 6–23)
CO2: 18 mmol/L — AB (ref 19–32)
CREATININE: 3.67 mg/dL — AB (ref 0.50–1.10)
Calcium: 9.2 mg/dL (ref 8.4–10.5)
Chloride: 98 mmol/L (ref 96–112)
GFR calc Af Amer: 13 mL/min — ABNORMAL LOW (ref >90)
GFR calc non Af Amer: 12 mL/min — ABNORMAL LOW (ref >90)
GLUCOSE: 450 mg/dL — AB (ref 70–99)
Potassium: 5.2 mmol/L — ABNORMAL HIGH (ref 3.5–5.1)
SODIUM: 129 mmol/L — AB (ref 135–145)

## 2014-06-10 LAB — TROPONIN I: Troponin I: <0.03 ng/mL (ref <0.031)

## 2014-06-10 LAB — GLUCOSE, RANDOM: Glucose, Bld: 505 mg/dL — ABNORMAL HIGH (ref 70–99)

## 2014-06-10 LAB — CBG MONITORING, ED
Glucose-Capillary: 428 mg/dL — ABNORMAL HIGH (ref 70–99)
Glucose-Capillary: 385 mg/dL — ABNORMAL HIGH (ref 70–99)

## 2014-06-10 LAB — GLUCOSE, CAPILLARY: Glucose-Capillary: 465 mg/dL — ABNORMAL HIGH (ref 70–99)

## 2014-06-10 MED ORDER — INSULIN ASPART 100 UNIT/ML ~~LOC~~ SOLN
0.0000 [IU] | Freq: Three times a day (TID) | SUBCUTANEOUS | Status: DC
  Administered 2014-06-11 (×2): 8 [IU] via SUBCUTANEOUS
  Administered 2014-06-11: 5 [IU] via SUBCUTANEOUS
  Administered 2014-06-12: 3 [IU] via SUBCUTANEOUS
  Administered 2014-06-12 (×2): 2 [IU] via SUBCUTANEOUS
  Administered 2014-06-13 (×2): 3 [IU] via SUBCUTANEOUS
  Administered 2014-06-14: 2 [IU] via SUBCUTANEOUS
  Administered 2014-06-14: 5 [IU] via SUBCUTANEOUS
  Administered 2014-06-15: 3 [IU] via SUBCUTANEOUS
  Administered 2014-06-15 (×2): 2 [IU] via SUBCUTANEOUS

## 2014-06-10 MED ORDER — ALUM & MAG HYDROXIDE-SIMETH 200-200-20 MG/5ML PO SUSP: 30.0000 mL | Freq: Four times a day (QID) | ORAL | Status: DC | PRN

## 2014-06-10 MED ORDER — INSULIN ASPART 100 UNIT/ML ~~LOC~~ SOLN
0.0000 [IU] | Freq: Every day | SUBCUTANEOUS | Status: DC
  Administered 2014-06-14: 3 [IU] via SUBCUTANEOUS

## 2014-06-10 MED ORDER — PANTOPRAZOLE SODIUM 40 MG PO TBEC
40.0000 mg | DELAYED_RELEASE_TABLET | Freq: Every day | ORAL | Status: DC
  Administered 2014-06-10 – 2014-06-16 (×7): 40 mg via ORAL
  Filled 2014-06-10 (×7): qty 1

## 2014-06-10 MED ORDER — MORPHINE SULFATE 2 MG/ML IJ SOLN: 1.0000 mg | INTRAMUSCULAR | Status: DC | PRN

## 2014-06-10 MED ORDER — INSULIN ASPART 100 UNIT/ML ~~LOC~~ SOLN
10.0000 [IU] | Freq: Once | SUBCUTANEOUS | Status: AC
  Administered 2014-06-10: 10 [IU] via SUBCUTANEOUS

## 2014-06-10 MED ORDER — ONDANSETRON HCL 4 MG/2ML IJ SOLN
4.0000 mg | Freq: Four times a day (QID) | INTRAMUSCULAR | Status: DC | PRN
  Administered 2014-06-10: 4 mg via INTRAVENOUS
  Filled 2014-06-10: qty 2

## 2014-06-10 MED ORDER — NITROGLYCERIN 0.4 MG SL SUBL
0.4000 mg | SUBLINGUAL_TABLET | SUBLINGUAL | Status: DC | PRN
  Administered 2014-06-10: 0.4 mg via SUBLINGUAL
  Filled 2014-06-10 (×2): qty 1

## 2014-06-10 MED ORDER — SODIUM CHLORIDE 0.9 % IV SOLN
INTRAVENOUS | Status: DC
  Administered 2014-06-10: 18:00:00 via INTRAVENOUS

## 2014-06-10 MED ORDER — PREDNISONE 5 MG PO TABS
5.0000 mg | ORAL_TABLET | Freq: Every day | ORAL | Status: DC
  Administered 2014-06-11: 5 mg via ORAL
  Filled 2014-06-10 (×2): qty 1

## 2014-06-10 MED ORDER — CLOPIDOGREL BISULFATE 75 MG PO TABS
75.0000 mg | ORAL_TABLET | Freq: Every day | ORAL | Status: DC
  Administered 2014-06-11 – 2014-06-16 (×5): 75 mg via ORAL
  Filled 2014-06-10 (×7): qty 1

## 2014-06-10 MED ORDER — ONDANSETRON HCL 4 MG PO TABS
4.0000 mg | ORAL_TABLET | Freq: Four times a day (QID) | ORAL | Status: DC | PRN
  Filled 2014-06-10: qty 1

## 2014-06-10 MED ORDER — SODIUM CHLORIDE 0.9 % IV SOLN
1.0000 g | Freq: Once | INTRAVENOUS | Status: AC
  Administered 2014-06-10: 1 g via INTRAVENOUS
  Filled 2014-06-10: qty 10

## 2014-06-10 MED ORDER — FLUTICASONE PROPIONATE 50 MCG/ACT NA SUSP
2.0000 | Freq: Every day | NASAL | Status: DC
  Administered 2014-06-11 – 2014-06-16 (×4): 2 via NASAL
  Filled 2014-06-10 (×2): qty 16

## 2014-06-10 MED ORDER — HEPARIN SODIUM (PORCINE) 5000 UNIT/ML IJ SOLN
5000.0000 [IU] | Freq: Three times a day (TID) | INTRAMUSCULAR | Status: DC
  Administered 2014-06-10 – 2014-06-12 (×7): 5000 [IU] via SUBCUTANEOUS
  Filled 2014-06-10 (×11): qty 1

## 2014-06-10 MED ORDER — THIAMINE HCL 100 MG/ML IJ SOLN
100.0000 mg | Freq: Every day | INTRAMUSCULAR | Status: DC
  Administered 2014-06-10 – 2014-06-11 (×2): 100 mg via INTRAVENOUS
  Filled 2014-06-10 (×2): qty 1

## 2014-06-10 MED ORDER — FOLIC ACID 5 MG/ML IJ SOLN
1.0000 mg | Freq: Every day | INTRAMUSCULAR | Status: DC
  Administered 2014-06-10: 1 mg via INTRAVENOUS
  Filled 2014-06-10 (×3): qty 0.2

## 2014-06-10 MED ORDER — DICLOFENAC SODIUM 1 % TD GEL
2.0000 g | Freq: Four times a day (QID) | TRANSDERMAL | Status: DC
  Administered 2014-06-11 – 2014-06-12 (×5): 2 g via TOPICAL
  Filled 2014-06-10: qty 100

## 2014-06-10 MED ORDER — ASPIRIN EC 81 MG PO TBEC
81.0000 mg | DELAYED_RELEASE_TABLET | Freq: Every day | ORAL | Status: DC
  Administered 2014-06-11 – 2014-06-14 (×3): 81 mg via ORAL
  Filled 2014-06-10 (×4): qty 1

## 2014-06-10 MED ORDER — ALLOPURINOL 100 MG PO TABS
50.0000 mg | ORAL_TABLET | Freq: Two times a day (BID) | ORAL | Status: DC
  Administered 2014-06-10 – 2014-06-16 (×12): 50 mg via ORAL
  Filled 2014-06-10 (×18): qty 0.5

## 2014-06-10 MED ORDER — NITROGLYCERIN 2 % TD OINT
0.5000 [in_us] | TOPICAL_OINTMENT | Freq: Four times a day (QID) | TRANSDERMAL | Status: DC
  Administered 2014-06-10 – 2014-06-11 (×3): 0.5 [in_us] via TOPICAL
  Filled 2014-06-10: qty 30

## 2014-06-10 MED ORDER — DOCUSATE SODIUM 100 MG PO CAPS
100.0000 mg | ORAL_CAPSULE | Freq: Two times a day (BID) | ORAL | Status: DC
  Administered 2014-06-11 – 2014-06-15 (×8): 100 mg via ORAL
  Filled 2014-06-10 (×13): qty 1

## 2014-06-10 MED ORDER — SODIUM CHLORIDE 0.9 % IV BOLUS (SEPSIS)
1000.0000 mL | Freq: Once | INTRAVENOUS | Status: AC
  Administered 2014-06-10: 1000 mL via INTRAVENOUS

## 2014-06-10 MED ORDER — SODIUM CHLORIDE 0.9 % IJ SOLN
3.0000 mL | Freq: Two times a day (BID) | INTRAMUSCULAR | Status: DC
  Administered 2014-06-12 – 2014-06-16 (×6): 3 mL via INTRAVENOUS

## 2014-06-10 MED ORDER — METOPROLOL SUCCINATE ER 25 MG PO TB24
25.0000 mg | ORAL_TABLET | Freq: Two times a day (BID) | ORAL | Status: DC
  Filled 2014-06-10 (×3): qty 1

## 2014-06-10 NOTE — Progress Notes (Signed)
Attending MD awaiting results of STAT BMET to clarify accuracy of electrolyte abnormalities seen on the I-stat to determine type of bed needed before completing admission.  Erin Hearing, ANP

## 2014-06-10 NOTE — H&P (Signed)
[facility] History and Physical                                                                                    Jo Lynn, is a 69 y.o. female  MRN: [id number]   DOB - 01/20/1946  Admit Date - 06/10/2014  Outpatient Primary MD for the patient is Chevis Pretty, FNP  With History of -  Past Medical History  Diagnosis Date  . Myocardial infarction 10/2002    - MV CAD --> Referred for CABG (no follow-up post CABG)  . 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 Lucianne Lei Trigt: LIMA-LAD, SVG-RPDA, SVG-OM1-OM2  . ST elevation myocardial infarction (STEMI) of inferolateral wall 09/09/2013    SVG-Om1-2 occluded - PCI in 2 locations; Occluded SVG-RCA & native RCA, LAD & native Cx occcluded.  EF ~20%  . Cardiomyopathy, ischemic 09/09/2013    EF ~20-25%; Large MI, existing occluded RCA & SVG-RCA; h/o CABG  . 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 CAD S/P percutaneous coronary angioplasty 09/09/2013    mid SVG-OM1-OM2 (prox LIMB) Rebel BMS 4.0 mm x 16 mm; sequential limb OM1-OM2: Rebel BMS 4.0 mm x 12 mm  . Combined systolic and diastolic congestive heart failure, NYHA class 3 09/09/2013    Echo post Inferolateral STEMI: Severely reduced LVEF ~20-35%; Akinesis of Inferolateral, Inferior & Inferoseptal walls; Grade 1 DDysfxn (initial LVEDP ~26 mmHg with SBP of 99 mHg)  . Essential hypertension 08/17/2012  . Hyperlipidemia with target LDL less than 70 08/17/2012  . Diabetes mellitus type 2 with complications 05/10/4006  . AICD (automatic cardioverter/defibrillator) present 02/21/2014    single chamber  by dr taylor      Past  Surgical History  Procedure Laterality Date  . Coronary artery bypass graft    . Left heart catheterization with coronary angiogram N/A 09/09/2013    Procedure: LEFT HEART CATHETERIZATION WITH CORONARY ANGIOGRAM;  Surgeon: Leonie Man, MD;  Location: [facility] CATH LAB;  Service: Cardiovascular;  Laterality: N/A;  . Implantable cardioverter defibrillator implant  02-21-2014    STJ single chamber ICD implanted by Dr Lovena Le for primary prevention  . Implantable cardioverter defibrillator implant N/A 02/21/2014    Procedure: IMPLANTABLE CARDIOVERTER DEFIBRILLATOR IMPLANT;  Surgeon: Evans Lance, MD;  Location: [facility] CATH LAB;  Service: Cardiovascular;  Laterality: N/A;    in for   Chief Complaint  Patient presents with  . Hyperglycemia     HPI 69 year old female patient with known history of stage IV chronic kidney disease, hypertension, dyslipidemia, history of MI with subsequent residual ischemic cardio myopathy with an EF of 20-25%, status post ICD, diabetes (not on insulin) and recent outpatient therapy for tendinitis right ankle. Patient was not feeling well at work and that her  glucose level was checked. It was greater than 600 so she presented to the ER for further evaluation and treatment. She was denying any complaints to the ER staff.  Upon evaluation in the ER patient was hemodynamically stable with stable sinus bradycardia rates in the 50s. Room air oxygen saturations were 100%. Her initial i-STAT laboratory data though was abnormal with a potassium of greater than 7.5 BUN 162 and creatinine 4.17 with most recent baseline renal function BUN 111 and creatinine 2.22. Patient did not have any EKG changes that would be consistent with hyperkalemia. It was suspected that the specimens were hemolyzed and after repeat electrolyte panel was completed potassium was now 5.1 with a sodium of 130, BUN still greater than 140, creatinine 3.7, glucose 477. In review of patient's laboratory data trends it  appears her BUN and creatinine have been progressively increasing since February 2016 from a baseline of 50/1.80 on 2/26 at time of discharge. As noted patient has been on prednisone taper for about 3 weeks for presumed gout versus tendinitis in the right ankle. Daughter clarified that patient was given "Z-Pak" when questioned about the prednisone (presume she was referring to steroid Dosepak) and states she did not feel comfortable with her mother taking very high doses of steroids so has been only given the patient 1 pill twice daily (? 20 mg tablets). Since beginning the prednisone patient has developed indigestion involving the left lateral chest radiating up into the bottom of the throat. Patient reports that this pain comes and go with out any specific precipitating factors although at times it seems to be worse several hours after eating. For several months, and since the patient had her MI and placement of ICD, patient has had mild failure to thrive symptoms with poor appetite. She has lost at least 30 pounds, but important to note that a significant amount of this weight was edema secondary to congestive heart failure. Patient has not had any nausea or vomiting, no lower extremity edema, no classic or substernal chest pain, no dyspnea on exertion or chest pain with exertion, no defibrillator discharges. Daughter states that patient typically just picks at her food and doesn't eat well although currently patient is stating she is very hungry and would like to eat. She is normally on a 1500 mL fluid restriction. EKG in the ER unremarkable. As a precaution and potassium was initially felt to be high with 1 amp of calcium gluconate was given.  Review of Systems   In addition to the HPI above,  No Fever-chills, myalgias or other constitutional symptoms No Headache, changes with Vision or hearing, new weakness, tingling, numbness in any extremity, No problems swallowing food or Liquids No Chest pain, Cough  or Shortness of Breath, palpitations, orthopnea or DOE No N/V; no melena or hematochezia, no dark tarry stools No dysuria, hematuria or flank pain No new skin rashes, lesions, masses or bruises No polyuria, polydypsia or polyphagia,  *A full 10 point Review of Systems was done, except as stated above, all other Review of Systems were negative.  Social History History  Substance Use Topics  . Smoking status: Former Research scientist (life sciences)  . Smokeless tobacco: Never Used  . Alcohol Use: No    Family History Family History  Problem Relation Age of Onset  . Diabetes Mother   . Diabetes Father     Prior to Admission medications   Medication Sig Start Date End Date Taking? Authorizing Provider  allopurinol (ZYLOPRIM) 100 MG tablet Take 100 mg by  mouth 2 (two) times daily. 05/25/14  Yes Historical Provider, MD  aspirin EC 81 MG EC tablet Take 1 tablet (81 mg total) by mouth daily. 09/14/13  Yes Almyra Deforest, PA  clopidogrel (PLAVIX) 75 MG tablet Take 1 tablet (75 mg total) by mouth daily. 01/10/14  Yes Mary-Margaret Hassell Done, FNP  diclofenac sodium (VOLTAREN) 1 % GEL Apply 2 g topically 4 (four) times daily. Patient taking differently: Apply 2 g topically 4 (four) times daily as needed (pain).  11/14/13  Yes Mary-Margaret Hassell Done, FNP  fluticasone (FLONASE) 50 MCG/ACT nasal spray Place 2 sprays into both nostrils daily. 04/28/14  Yes Mary-Margaret Hassell Done, FNP  furosemide (LASIX) 40 MG tablet Take 1 tablet (40 mg total) by mouth 2 (two) times daily. 06/02/14  Yes Minus Breeding, MD  losartan (COZAAR) 50 MG tablet Take 50 mg by mouth daily.  04/14/14  Yes Historical Provider, MD  metolazone (ZAROXOLYN) 2.5 MG tablet Take 1 tablet (2.5 mg total) by mouth every Saturday. 04/22/14  Yes Thurnell Lose, MD  metoprolol succinate (TOPROL-XL) 25 MG 24 hr tablet Take 1 tablet (25 mg total) by mouth 2 (two) times daily. Take with or immediately following a meal. 02/22/14  Yes Rhonda G Barrett, PA-C  nitroGLYCERIN (NITROSTAT) 0.4  MG SL tablet Place 1 tablet (0.4 mg total) under the tongue every 5 (five) minutes as needed for chest pain. 12/01/13  Yes Minus Breeding, MD  sitaGLIPtin (JANUVIA) 100 MG tablet Take 1 tablet (100 mg total) by mouth daily. 03/16/14  Yes Mary-Margaret Hassell Done, FNP  benzonatate (TESSALON) 100 MG capsule Take 1 capsule (100 mg total) by mouth 2 (two) times daily as needed for cough. 02/12/14   Mary-Margaret Hassell Done, FNP  colchicine 0.6 MG tablet Take 1 tablet (0.6 mg total) by mouth daily. 05/06/14   Mary-Margaret Hassell Done, FNP    No Known Allergies  Physical Exam  Vitals  Blood pressure 134/88, pulse 54, resp. rate 16, height 5\' 7"  (1.702 m), weight 170 lb 6.4 oz (77.293 kg), SpO2 100 %.   General:  In no acute distress, appears healthy and well nourished  Psych:  Normal affect, Denies Suicidal or Homicidal ideations, Awake Alert, Oriented X 3. Speech and thought patterns are clear and appropriate, no apparent short term memory deficits  Neuro:   No focal neurological deficits, CN II through XII intact, Strength 5/5 all 4 extremities, Sensation intact all 4 extremities.  ENT:  Ears and Eyes appear Normal, Conjunctivae clear, PER. Moist oral mucosa without erythema or exudates.  Neck:  Supple, No lymphadenopathy appreciated  Respiratory:  Symmetrical chest wall movement, Good air movement bilaterally, CTAB. Room Air  Cardiac:  RRR, No Murmurs, no LE edema noted, no JVD, No carotid bruits, peripheral pulses palpable at 2+  Abdomen:  Positive bowel sounds, Soft, Non tender, Non distended,  No masses appreciated, no obvious hepatosplenomegaly  Skin:  No Cyanosis, Normal Skin Turgor, No Skin Rash or Bruise.  Extremities: Symmetrical without obvious trauma or injury,  no effusions. Right ankle joint unremarkable.  Data Review  CBC  Recent Labs Lab 06/10/14 1433 06/10/14 1438 06/10/14 1615  WBC 13.8*  --   --   HGB 12.8 14.6 12.6  HCT 37.7 43.0 37.0  PLT 150  --   --   MCV 86.1  --    --   MCH 29.2  --   --   MCHC 34.0  --   --   RDW 15.6*  --   --   LYMPHSABS 0.9  --   --  MONOABS 0.5  --   --   EOSABS 0.0  --   --   BASOSABS 0.0  --   --     Chemistries   Recent Labs Lab 06/10/14 1433 06/10/14 1438 06/10/14 1610 06/10/14 1615  NA 128* 127* 129* 130*  K >7.5* 7.5* 5.2* 5.1  CL 93* 98 98 101  CO2 19  --  18*  --   GLUCOSE 481* 489* 450* 477*  BUN 162* >140* 153* >140*  CREATININE 4.17* 4.00* 3.67* 3.70*  CALCIUM 9.4  --  9.2  --   AST 30  --   --   --   ALT 22  --   --   --   ALKPHOS 80  --   --   --   BILITOT 1.9*  --   --   --     estimated creatinine clearance is 15.2 mL/min (by C-G formula based on Cr of 3.7).  No results for input(s): TSH, T4TOTAL, T3FREE, THYROIDAB in the last 72 hours.  Invalid input(s): FREET3  Coagulation profile No results for input(s): INR, PROTIME in the last 168 hours.  No results for input(s): DDIMER in the last 72 hours.  Cardiac Enzymes No results for input(s): CKMB, TROPONINI, MYOGLOBIN in the last 168 hours.  Invalid input(s): CK  Invalid input(s): POCBNP  Urinalysis    Component Value Date/Time   COLORURINE YELLOW 06/10/2014 [place] 06/10/2014 1457   LABSPEC 1.014 06/10/2014 1457   PHURINE 5.0 06/10/2014 1457   GLUCOSEU 500* 06/10/2014 1457   HGBUR NEGATIVE 06/10/2014 [place] 06/10/2014 [place] 06/10/2014 1457   PROTEINUR NEGATIVE 06/10/2014 1457   UROBILINOGEN 0.2 06/10/2014 1457   NITRITE NEGATIVE 06/10/2014 1457   LEUKOCYTESUR NEGATIVE 06/10/2014 1457    Imaging results:   No results found.   EKG: Sinus bradycardia   Assessment & Plan  Principal Problem:   Diabetes mellitus type 2, uncontrolled -Admit to telemetry -Hold Januvia since long-acting medication and clearance may be affected by renal function-hospital typically substitutes Tradjenta; this medication is appropriate to use as oral agent in person with decreased  renal clearance -Utilize moderate sliding scale insulin -Check hemoglobin A1c -Given renal function if it is suspected Januvia is inappropriate medication for patient with severe chronic kidney disease patient may need to be transitioned to long-acting insulin versus taught how to check CBGs more frequently and administer short acting insulin via sliding scale -No indication to begin IV insulin; patient has metabolic acidosis in setting of chronic kidney disease  Active Problems:   CKD (chronic kidney disease), stage IV -Has progressed and likely reflective of recent diuresis and low output heart failure -Possibly early cardiorenal syndrome -We will cautiously give IV fluids at 50 mL per hour and hold diuretics for now -Family has requested formal nephrology consultation while hospitalized; please consult nephrology on 4/16 -Follow labs    Essential hypertension -Blood pressure well controlled -Given progression of chronic kidney disease will decrease Cozaar dosage by half but continue metoprolol especially in setting of known CAD    Cardiomyopathy, ischemic - EF ~20-25%; Large MI, existing occluded RCA & SVG-RCA; h/o CABG -Currently stable and heart failure compensated based on exam -Follow closely with gentle IV fluid hydration    Indigestion -Suspect related to recent prednisone use -Hemoglobin stable but it decreases with hydration consider checking stools for blood -Begin PPI -Since known history of CAD we'll cycle cardiac enzymes as precaution in  the event her indigestion is atypical presentation of ischemia; current EKG nonischemic    S/P ICD (internal cardiac defibrillator) procedure -Stable and no recent defibrillator discharges    Tendonitis of ankle, right -Attending physician discussed with orthopedic physician Dr. Sharol Given -Okay to stop prednisone -Since we are unclear at this juncture exact dosage of prednisone patient was taking we will not abruptly stop this medication  but instead use a very low-dose of 5 mg daily for several days and then discontinue prednisone prior to discharge -Physical therapy evaluation; patient reports pain with ambulation    DVT Prophylaxis: Subcutaneous heparin  Family Communication: Daughter at bedside    Code Status: DO NOT RESUSCITATE-this was clarified with the patient and daughter at the bedside per my attending physician   Condition:  Stable  Time spent in minutes : 60   ELLIS,ALLISON L. ANP on 06/10/2014 at 5:47 PM  Between 7am to 7pm - Pager - [phone]  After 7pm go to [email or web address] - password [email or web address]  And look for the night coverage person covering me after hours  [facility]

## 2014-06-10 NOTE — ED Notes (Signed)
Pt reports high blood sugar at home this morning. Daughter reports patient doesn't eat and skips meals at home. Pt states "I feel fine, but I think my blood sugar is high." denies pain. Pt is alert and oriented x4.

## 2014-06-10 NOTE — Progress Notes (Signed)
Upon coming to unit via bed, pt requested to walk to bathroom. Pt denied any discomfort so assisted to BR. While in BR pt c/o indigestion pain, assisted to bed where she vomited X3 and began complaining of pain to mid chest feeling like a hammer hitting it,unable to score pain, 1 tab SL nitro given and 4mg  zofran, and ekg completed. Pt with complete relief. Ebony Hail NP notified of event and EKG showing ST depression compared to 1507 ekg. orders given for 1/2" nitro paste to be applied and continue to monitor and if another event happens to get another ekg and notify on call MD. Family at bedside updated.

## 2014-06-10 NOTE — ED Provider Notes (Signed)
CSN: [id number]     Arrival date & time 06/10/14  1256 History   First MD Initiated Contact with Patient 06/10/14 1317     Chief Complaint  Patient presents with  . Hyperglycemia     (Consider location/radiation/quality/duration/timing/severity/associated sxs/prior Treatment) HPI   Jo Lynn is a 69 y.o. female past medical history significant for non-insulin-dependent diabetes, CAD, CHF (EF20-35%) sent to the ED from her work to check blood sugar on her and it read as greater than 400. Patient presented at work due to pain in the right anterior ankle. She's been treated for a tendinitis versus gout with a 2 week prednisone taper by Dr. Sharol Given. Patient denies chest pain, shortness of breath, nausea, vomiting, increased urinary frequency, abdominal pain, fatigue. She has no complaints.  PCP [facility]  Past Medical History  Diagnosis Date  . Myocardial infarction 10/2002    - MV CAD --> Referred for CABG (no follow-up post CABG)  . 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 Lucianne Lei Trigt: LIMA-LAD, SVG-RPDA, SVG-OM1-OM2  . ST elevation myocardial infarction (STEMI) of inferolateral wall 09/09/2013    SVG-Om1-2 occluded - PCI in 2 locations; Occluded SVG-RCA & native RCA, LAD & native Cx occcluded.  EF ~20%  . Cardiomyopathy, ischemic 09/09/2013    EF ~20-25%; Large MI, existing occluded RCA & SVG-RCA; h/o CABG  . 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 CAD S/P percutaneous coronary angioplasty 09/09/2013    mid SVG-OM1-OM2 (prox LIMB) Rebel BMS 4.0 mm x 16 mm; sequential limb OM1-OM2: Rebel BMS 4.0 mm x 12 mm  .  Combined systolic and diastolic congestive heart failure, NYHA class 3 09/09/2013    Echo post Inferolateral STEMI: Severely reduced LVEF ~20-35%; Akinesis of Inferolateral, Inferior & Inferoseptal walls; Grade 1 DDysfxn (initial LVEDP ~26 mmHg with SBP of 99 mHg)  . Essential hypertension 08/17/2012  . Hyperlipidemia with target LDL less than 70 08/17/2012  . Diabetes mellitus type 2 with complications 2/63/7858  . 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 Coronary artery bypass graft    . Left heart catheterization with coronary angiogram N/A 09/09/2013    Procedure: LEFT HEART CATHETERIZATION WITH CORONARY ANGIOGRAM;  Surgeon: Leonie Man, MD;  Location: [facility] CATH LAB;  Service: Cardiovascular;  Laterality: N/A;  . Implantable cardioverter defibrillator implant  02-21-2014    STJ single chamber ICD implanted by Dr Lovena Le for primary prevention  . Implantable cardioverter defibrillator implant N/A 02/21/2014    Procedure: IMPLANTABLE CARDIOVERTER DEFIBRILLATOR IMPLANT;  Surgeon: Evans Lance, MD;  Location: [facility] CATH LAB;  Service: Cardiovascular;  Laterality: N/A;   Family History  Problem Relation Age of Onset  . Diabetes Mother   . Diabetes Father    History  Substance Use Topics  . Smoking status: Former Research scientist (life sciences)  . Smokeless tobacco: Never Used  . Alcohol Use: No   OB History    No data available     Review of Systems  10  systems reviewed and found to be negative, except as noted in the HPI.   Allergies  Review of patient's allergies indicates no known allergies.  Home Medications   Prior to Admission medications   Medication Sig Start Date End Date Taking? Authorizing Provider  aspirin EC 81 MG EC tablet Take 1 tablet (81 mg total) by mouth daily. 09/14/13   Almyra Deforest, PA  benzonatate (TESSALON) 100 MG capsule Take 1 capsule (100 mg total) by mouth 2 (two) times  daily as needed for cough. 02/12/14   Mary-Margaret Hassell Done, FNP  clopidogrel (PLAVIX) 75 MG tablet Take 1 tablet (75 mg total) by mouth daily. 01/10/14   Mary-Margaret Hassell Done, FNP  colchicine 0.6 MG tablet Take 1 tablet (0.6 mg total) by mouth daily. 05/06/14   Mary-Margaret Hassell Done, FNP  diclofenac sodium (VOLTAREN) 1 % GEL Apply 2 g topically 4 (four) times daily. Patient taking differently: Apply 2 g topically 4 (four) times daily as needed (pain).  11/14/13   Mary-Margaret Hassell Done, FNP  fluticasone (FLONASE) 50 MCG/ACT nasal spray Place 2 sprays into both nostrils daily. 04/28/14   Mary-Margaret Hassell Done, FNP  furosemide (LASIX) 40 MG tablet Take 1 tablet (40 mg total) by mouth 2 (two) times daily. 06/02/14   Minus Breeding, MD  KLOR-CON M20 20 MEQ tablet Take 20 mEq by mouth once.  04/22/14   Historical Provider, MD  losartan (COZAAR) 50 MG tablet Take 50 mg by mouth daily.  04/14/14   Historical Provider, MD  metolazone (ZAROXOLYN) 2.5 MG tablet Take 1 tablet (2.5 mg total) by mouth every Saturday. 04/22/14   Thurnell Lose, MD  metoprolol succinate (TOPROL-XL) 25 MG 24 hr tablet Take 1 tablet (25 mg total) by mouth 2 (two) times daily. Take with or immediately following a meal. 02/22/14   Rhonda G Barrett, PA-C  nitroGLYCERIN (NITROSTAT) 0.4 MG SL tablet Place 1 tablet (0.4 mg total) under the tongue every 5 (five) minutes as needed for chest pain. 12/01/13   Minus Breeding, MD  sitaGLIPtin (JANUVIA) 100 MG tablet Take 1 tablet (100 mg total) by mouth daily. 03/16/14   Mary-Margaret Hassell Done, FNP   BP 109/55 mmHg  Pulse 51  Resp 33  Ht 5\' 7"  (1.702 m)  Wt 170 lb 6.4 oz (77.293 kg)  BMI 26.68 kg/m2  SpO2 100% Physical Exam  Constitutional: She is oriented to person, place, and time. She appears well-developed and well-nourished. No distress.  HENT:  Head: Normocephalic and atraumatic.  Mouth/Throat: Oropharynx is clear and moist.  Eyes: Conjunctivae and EOM are normal. Pupils are equal, round, and  reactive to light.  Cardiovascular: Normal rate, regular rhythm and intact distal pulses.   Pulmonary/Chest: Effort normal and breath sounds normal. No stridor. No respiratory distress. She has no wheezes. She has no rales. She exhibits no tenderness.  Abdominal: Soft. Bowel sounds are normal. She exhibits no distension and no mass. There is no tenderness. There is no rebound and no guarding.  Musculoskeletal: Normal range of motion.  Neurological: She is alert and oriented to person, place, and time.  Skin: Skin is warm.  Psychiatric: She has a normal mood and affect.  Nursing note and vitals reviewed.   ED Course  Procedures (including critical care time) Labs Review Labs Reviewed  CBC WITH DIFFERENTIAL/PLATELET - Abnormal; Notable for the following:    WBC 13.8 (*)    RDW 15.6 (*)    Neutrophils Relative % 91 (*)    Neutro Abs 12.4 (*)    Lymphocytes  Relative 6 (*)    All other components within normal limits  COMPREHENSIVE METABOLIC PANEL - Abnormal; Notable for the following:    Sodium 128 (*)    Potassium >7.5 (*)    Chloride 93 (*)    Glucose, Bld 481 (*)    BUN 162 (*)    Creatinine, Ser 4.17 (*)    Total Bilirubin 1.9 (*)    GFR calc non Af Amer 10 (*)    GFR calc Af Amer 11 (*)    Anion gap 16 (*)    All other components within normal limits  URINALYSIS, ROUTINE W REFLEX MICROSCOPIC - Abnormal; Notable for the following:    Glucose, UA 500 (*)    All other components within normal limits  CBG MONITORING, ED - Abnormal; Notable for the following:    Glucose-Capillary 428 (*)    All other components within normal limits  I-STAT CHEM 8, ED - Abnormal; Notable for the following:    Sodium 127 (*)    Potassium 7.5 (*)    BUN >140 (*)    Creatinine, Ser 4.00 (*)    Glucose, Bld 489 (*)    Calcium, Ion 1.07 (*)    All other components within normal limits  I-STAT VENOUS BLOOD GAS, ED - Abnormal; Notable for the following:    pH, Ven 7.346 (*)    pCO2, Ven 42.9 (*)     pO2, Ven 22.0 (*)    All other components within normal limits  URINE CULTURE  BLOOD GAS, VENOUS  BASIC METABOLIC PANEL    Imaging Review No results found.   EKG Interpretation   Date/Time:  Friday June 10 2014 15:07:34 EDT Ventricular Rate:  52 PR Interval:  162 QRS Duration: 122 QT Interval:  504 QTC Calculation: 469 R Axis:   48 Text Interpretation:  Sinus rhythm Nonspecific intraventricular conduction  delay Inferior infarct, old T wave abnormality Lateral leads Baseline  wander When compared with ECG of 04/18/2014 T wave abnormality Lateral  leads is more pronounced Confirmed by [facility]  MD, Nunzio Cory [phone]) on  06/10/2014 3:12:44 PM      MDM   Final diagnoses:  Acute-on-chronic kidney injury  Hyperglycemia without ketosis    Filed Vitals:   06/10/14 1310 06/10/14 1515 06/10/14 1530  BP: 109/56 105/51 109/55  Pulse: 56 51 51  Resp: 18 15 33  Height: 5\' 7"  (1.702 m)    Weight: 170 lb 6.4 oz (77.293 kg)    SpO2: 100% 100% 100%    Medications  calcium gluconate 1 g in sodium chloride 0.9 % 100 mL IVPB (not administered)  sodium chloride 0.9 % bolus 1,000 mL (1,000 mLs Intravenous New Bag/Given 06/10/14 1420)    Jo Lynn is a pleasant 69 y.o. female presenting with hyperglycemia. Patient is non-insulin-dependent diabetic. She recently started a several week steroid taper. Patient is asymptomatic. Appears well-hydrated, not tachycardic.   Patient has elevated blood glucose around 400, mildly elevated Elliott anion gap of 18. Her creatinine is 4.17, this is double her normal value. Patient will be given fluid bolus. Sodium is 128 however corrected it is 133. Her potassium read high at greater than 7.5 however I think this is likely secondary to hemolysis, nurse verified that they had issues obtaining blood. EKG shows no peaked T waves. Patient is severely dehydrated from her hyperglycemia she is a very hard stick. We will get an arterial stick and evaluate  potassium.  Case discussed with [facility] hospitalist Ebony Hail who is  working with Dr. Verlon Au, she will see the patient and put in the holding orders.  There is been an extensive discussion about whether the potassium is truly elevated or hemolyzed, Dr. Verlon Au has ordered calcium gluconate. Arterial stick reveals a potassium with in the normal ranges. Discussed this with Ebony Hail, she will put him holding orders.   Monico Blitz, PA-C 06/10/14 [place], DO 06/11/14 [phone]

## 2014-06-10 NOTE — Progress Notes (Signed)
   69 y/o ? 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 Sharol Given Placed on steroids by Dr Sharol Given recently   Came to Ed as CBG 600-checked at work.  Has been on steroid taper for ~ 3 weeks Poor appetite 2/2 to being scared about issues regarding diet as "scared" of eating the wrong things as recent A/CHF C/o indigestions x 1 week-worsened by eating--Zantac helps--intermittent, NO radiation but sometimes in L chest No sob, no L arm weakness Some wght loss 2/2 to anorexia No dark or tarry stool No n/v   Pleasant African-American female in no apparent distress EOMI NCAT no pallor no icterus S1-S2 no murmur rub or gallop Abdomen soft nontender nondistended no epigastric tenderness nor organomegaly Clinically clear no added sound No lower extremity edema Right ankle examined, on in torsion and extortion there is no laxity of ligaments, ankle mortise appears to be stable. She has point tenderness on the front of the fibula Power is 5/5 she has no deficit   p- Patient comes in with cardiorenal syndrome and worsening glycemic control likely secondary to prednisone use. I suspect that the etiology of her cardiorenal syndrome with multifactorial with her ischemic EF but I also think that she is on Cozaar, Lasix, Zaroxolyn which will all be held She will only continue her metoprolol for now Nephrology will be consulted to assist in management I would recommend getting a urine sodium as well as urine creatinine if her kidney function does not improve with low-dose IV fluids carefully for 12 hours 50 cc overnight   this sounds like ATN secondary to acute volume depletion  patient will need to be discontinued off of her Januvia and placed on sliding scale sensitive  coverage She should not take any long-acting insulin because long-acting insulin potentiated by worsening renal failure can hang out in the body a lot longer  I did speak to Dr. Sharol Given and he was okay with no steroid as well as no antibiotics at present time--patient and family are unclear as to what medications they were taking from him and it sounds it May be a steroid taper  I will speak to the family before patient goes to the floor to make sure they bring in medications for rounding physician in the a.m.  For her vague abdominal pain I will cycle troponins although they may be positive cause of her renal insufficiency--it sounds like the vague abdominal pain is relieved I famotidine/Zantac and was brought on by the use of steroids  Patient admitted to telemetry   Verneita Griffes, MD [facility] [phone]

## 2014-06-10 NOTE — Telephone Encounter (Signed)
Juliann Pulse from [employer] called and stated that she had Daisie with her in her office and her BS was over 600. I spoke with Tammy and Ronnald Collum, FNP and they both stated patient needed to go to the hospital. Patient states that she did not want to go and I explained the importance and that the patient would need insulin and IV fluids to bring her BS down. Patients daughter states that she was going to carry her to the hospital for evaluation.

## 2014-06-10 NOTE — ED Notes (Signed)
Pt presents with CBG in the 400s today while at work.  Pt has been on prednisone x 2 weeks for tendonitis; pt denies pain or discomfort at present.

## 2014-06-11 ENCOUNTER — Encounter (HOSPITAL_COMMUNITY): Payer: Self-pay | Admitting: Cardiology

## 2014-06-11 DIAGNOSIS — N184 Chronic kidney disease, stage 4 (severe): Secondary | ICD-10-CM

## 2014-06-11 DIAGNOSIS — Z95810 Presence of automatic (implantable) cardiac defibrillator: Secondary | ICD-10-CM

## 2014-06-11 DIAGNOSIS — R1013 Epigastric pain: Secondary | ICD-10-CM

## 2014-06-11 DIAGNOSIS — I25119 Atherosclerotic heart disease of native coronary artery with unspecified angina pectoris: Secondary | ICD-10-CM

## 2014-06-11 DIAGNOSIS — I255 Ischemic cardiomyopathy: Secondary | ICD-10-CM

## 2014-06-11 DIAGNOSIS — I10 Essential (primary) hypertension: Secondary | ICD-10-CM

## 2014-06-11 DIAGNOSIS — N179 Acute kidney failure, unspecified: Secondary | ICD-10-CM | POA: Diagnosis present

## 2014-06-11 DIAGNOSIS — E1165 Type 2 diabetes mellitus with hyperglycemia: Principal | ICD-10-CM

## 2014-06-11 LAB — CBC
HCT: 35.2 % — ABNORMAL LOW (ref 36.0–46.0)
Hemoglobin: 11.8 g/dL — ABNORMAL LOW (ref 12.0–15.0)
MCH: 28.9 pg (ref 26.0–34.0)
MCHC: 33.5 g/dL (ref 30.0–36.0)
MCV: 86.3 fL (ref 78.0–100.0)
Platelets: 159 10*3/uL (ref 150–400)
RBC: 4.08 MIL/uL (ref 3.87–5.11)
RDW: 15.5 % (ref 11.5–15.5)
WBC: 11.6 10*3/uL — AB (ref 4.0–10.5)

## 2014-06-11 LAB — GLUCOSE, CAPILLARY
GLUCOSE-CAPILLARY: 273 mg/dL — AB (ref 70–99)
GLUCOSE-CAPILLARY: 256 mg/dL — AB (ref 70–99)
GLUCOSE-CAPILLARY: 183 mg/dL — AB (ref 70–99)
Glucose-Capillary: 224 mg/dL — ABNORMAL HIGH (ref 70–99)

## 2014-06-11 LAB — URINE CULTURE: Colony Count: 2000

## 2014-06-11 LAB — TROPONIN I
Troponin I: 0.05 ng/mL — ABNORMAL HIGH (ref <0.031)
Troponin I: 0.07 ng/mL — ABNORMAL HIGH (ref <0.031)

## 2014-06-11 LAB — COMPREHENSIVE METABOLIC PANEL
ALK PHOS: 73 U/L (ref 39–117)
ALT: 17 U/L (ref 0–35)
AST: 23 U/L (ref 0–37)
Albumin: 3 g/dL — ABNORMAL LOW (ref 3.5–5.2)
Anion gap: 15 (ref 5–15)
BILIRUBIN TOTAL: 0.8 mg/dL (ref 0.3–1.2)
BUN: 133 mg/dL — ABNORMAL HIGH (ref 6–23)
CALCIUM: 9.3 mg/dL (ref 8.4–10.5)
CHLORIDE: 99 mmol/L (ref 96–112)
CO2: 20 mmol/L (ref 19–32)
Creatinine, Ser: 3.49 mg/dL — ABNORMAL HIGH (ref 0.50–1.10)
GFR calc Af Amer: 14 mL/min — ABNORMAL LOW (ref >90)
GFR, EST NON AFRICAN AMERICAN: 12 mL/min — AB (ref >90)
GLUCOSE: 315 mg/dL — AB (ref 70–99)
Potassium: 5 mmol/L (ref 3.5–5.1)
Sodium: 134 mmol/L — ABNORMAL LOW (ref 135–145)
TOTAL PROTEIN: 6.8 g/dL (ref 6.0–8.3)

## 2014-06-11 MED ORDER — FOLIC ACID 1 MG PO TABS
1.0000 mg | ORAL_TABLET | Freq: Every day | ORAL | Status: DC
  Administered 2014-06-12 – 2014-06-16 (×5): 1 mg via ORAL
  Filled 2014-06-11 (×7): qty 1

## 2014-06-11 MED ORDER — SODIUM CHLORIDE 0.9 % IV SOLN
INTRAVENOUS | Status: DC
  Administered 2014-06-11: 14:00:00 via INTRAVENOUS
  Administered 2014-06-12: 75 mL/h via INTRAVENOUS

## 2014-06-11 MED ORDER — VITAMIN B-1 100 MG PO TABS
100.0000 mg | ORAL_TABLET | Freq: Every day | ORAL | Status: DC
  Administered 2014-06-12 – 2014-06-16 (×5): 100 mg via ORAL
  Filled 2014-06-11 (×6): qty 1

## 2014-06-11 MED ORDER — ISOSORBIDE MONONITRATE ER 30 MG PO TB24
30.0000 mg | ORAL_TABLET | Freq: Every day | ORAL | Status: DC
  Administered 2014-06-11: 30 mg via ORAL
  Filled 2014-06-11 (×2): qty 1

## 2014-06-11 MED ORDER — INSULIN GLARGINE 100 UNIT/ML ~~LOC~~ SOLN
10.0000 [IU] | Freq: Every day | SUBCUTANEOUS | Status: DC
  Administered 2014-06-11 – 2014-06-14 (×4): 10 [IU] via SUBCUTANEOUS
  Filled 2014-06-11 (×8): qty 0.1

## 2014-06-11 NOTE — Progress Notes (Signed)
Utilization Review Completed.Jo Lynn T4/16/2016  

## 2014-06-11 NOTE — Evaluation (Signed)
Physical Therapy Evaluation Patient Details Name: Jo Lynn MRN: [id number] DOB: 05-26-1945 Today's Date: 06/11/2014   History of Present Illness  69 yo female with onset of prednisone based tx for ankle pain with BS elevattion and medical complications.  Clinical Impression  Pt was assessed for limited effort because troponin was up suddenly and was awaiting a cardiology consult.  If cleared needs to just climb stairs and will be released for home as PT determines based on last visit.    Follow Up Recommendations Home health PT;Supervision - Intermittent (potentially unless PT can clear for stairs)    Equipment Recommendations  None recommended by PT (Awaiting finishing work)    Recommendations for Other Services       Precautions / Restrictions Precautions Precautions: Fall Restrictions Weight Bearing Restrictions: No Other Position/Activity Restrictions: Pain in L ankle with wbing but no fracture      Mobility  Bed Mobility Overal bed mobility: Modified Independent                Transfers Overall transfer level: Modified independent Equipment used: 1 person hand held assist (IV pole)                Ambulation/Gait Ambulation/Gait assistance: Min guard Ambulation Distance (Feet): 100 Feet Assistive device: 1 person hand held assist (IV pole) Gait Pattern/deviations: Decreased stride length;Shuffle Gait velocity: reduced Gait velocity interpretation: Below normal speed for age/gender General Gait Details: slow pace with pt having been encouraged to not overexert as her troponin is elevated and has not seen cardiologist  yet  Stairs Stairs:  (deferred due to elevated troponin)          Wheelchair Mobility    Modified Rankin (Stroke Patients Only)       Balance Overall balance assessment: Modified Independent                                           Pertinent Vitals/Pain Pain Assessment: Faces Faces Pain Scale:  Hurts little more Pain Location: L ankle Pain Intervention(s): Limited activity within patient's tolerance;Premedicated before session    Home Living Family/patient expects to be discharged to:: Private residence Living Arrangements: Children Available Help at Discharge: Family;Available PRN/intermittently Type of Home: House Home Access: Stairs to enter Entrance Stairs-Rails: Left Entrance Stairs-Number of Steps: 5 Home Layout: One level Home Equipment: None      Prior Function Level of Independence: Independent         Comments: works full time, 12hr shifts     Hand Dominance   Dominant Hand: Right    Extremity/Trunk Assessment   Upper Extremity Assessment: Overall WFL for tasks assessed           Lower Extremity Assessment: Overall WFL for tasks assessed      Cervical / Trunk Assessment: Normal  Communication   Communication: No difficulties  Cognition Arousal/Alertness: Awake/alert Behavior During Therapy: WFL for tasks assessed/performed Overall Cognitive Status: Within Functional Limits for tasks assessed                      General Comments General comments (skin integrity, edema, etc.): Pt had a pop up of troponin today and due to having a large stair set at home would like her to go up stairs after cardiology clears her    Exercises        Assessment/Plan  PT Assessment Patient needs continued PT services  PT Diagnosis Abnormality of gait   PT Problem List Decreased strength;Decreased activity tolerance;Decreased mobility;Cardiopulmonary status limiting activity  PT Treatment Interventions DME instruction;Gait training;Stair training;Functional mobility training;Therapeutic activities;Therapeutic exercise;Balance training;Neuromuscular re-education;Patient/family education   PT Goals (Current goals can be found in the Care Plan section) Acute Rehab PT Goals Patient Stated Goal: to walk and get back to work PT Goal Formulation:  With patient/family Time For Goal Achievement: 06/18/14 Potential to Achieve Goals: Good    Frequency Min 3X/week   Barriers to discharge Inaccessible home environment high stair set     Co-evaluation               End of Session Equipment Utilized During Treatment: Gait belt Activity Tolerance: Patient tolerated treatment well Patient left: in bed;with call bell/phone within reach;with chair alarm set;with family/visitor present Nurse Communication: Mobility status         Time: 1350-1410 PT Time Calculation (min) (ACUTE ONLY): 20 min   Charges:   PT Evaluation $Initial PT Evaluation Tier I: 1 Procedure     PT G CodesRamond Dial 09-Jul-2014, 5:28 PM   Mee Hives, PT MS Acute Rehab Dept. Number: [phone]

## 2014-06-11 NOTE — Consult Note (Signed)
Primary cardiologist: Dr. Minus Breeding Consulting cardiologist: Dr. Satira Sark  Reason for consultation: CP and abnormal ECG  Clinical Summary Ms. Moger is a medically complex 69 y.o.female with past medical history outlined below, currently admitted to the hospital with significant hyperglycemia, recent indigestion symptoms, recent treatment with steroid Dosepak in the setting of right ankle tendinitis, and poor appetite. Nursing notes reviewed, patient reported an episode of indigestion that occurred when she was in the bathroom, subsequent emesis and a feeling of midsternal discomfort. She was given nitroglycerin as well as Zofran, follow-up ECG obtained showing progressive anterolateral ST segment depression compared to baseline, although ECG is chronically abnormal with evidence of old inferior and anterior infarct patterns.  I spoke with the patient and her daughter today. She tells that she is feeling better today, has not had any recurrent chest pain, and does not endorse any recurring angina symptoms at baseline. She does not endorse any palpitations, and there have been no obvious recent device shocks. Telemetry reviewed.  I reviewed her cardiac history which is outlined below. She reports compliance with her medications. From the perspective of heart failure, she has not manifested any volume overload, in fact has lost weight, her daughter attributes this to poor appetite and "not eating enough."    No Known Allergies  Medications Scheduled Medications: . allopurinol  50 mg Oral BID  . aspirin EC  81 mg Oral Daily  . clopidogrel  75 mg Oral Daily  . diclofenac sodium  2 g Topical QID  . docusate sodium  100 mg Oral BID  . fluticasone  2 spray Each Nare Daily  . folic acid  1 mg Oral Daily  . heparin  5,000 Units Subcutaneous 3 times per day  . insulin aspart  0-15 Units Subcutaneous TID WC  . insulin aspart  0-5 Units Subcutaneous QHS  . insulin glargine  10  Units Subcutaneous Daily  . metoprolol succinate  25 mg Oral BID  . nitroGLYCERIN  0.5 inch Topical 4 times per day  . pantoprazole  40 mg Oral Daily  . sodium chloride  3 mL Intravenous Q12H  . [START ON 06/12/2014] thiamine  100 mg Oral Daily    Infusions: . sodium chloride 50 mL/hr at 06/11/14 1342    PRN Medications: alum & mag hydroxide-simeth, morphine injection, nitroGLYCERIN, ondansetron **OR** ondansetron (ZOFRAN) IV   Past Medical History  Diagnosis Date  . Myocardial infarction 10/2002    MV CAD --> Referred for CABG  . 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 Lucianne Lei Trigt: LIMA-LAD, SVG-RPDA, SVG-OM1-OM2  . ST elevation myocardial infarction (STEMI) of inferolateral wall 09/09/2013    SVG-Om1-2 occluded - PCI in 2 locations; Occluded SVG-RCA & native RCA, LAD & native Cx occcluded.  EF ~20%  . Cardiomyopathy, ischemic 09/09/2013    EF ~20-25%; Large MI, existing occluded RCA & SVG-RCA; h/o CABG  . 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 CAD S/P percutaneous coronary angioplasty 09/09/2013    mid SVG-OM1-OM2 (prox LIMB) Rebel BMS 4.0 mm x 16 mm; sequential limb  OM1-OM2: Rebel BMS 4.0 mm x 12 mm  . Combined systolic and diastolic congestive heart failure, NYHA class 3 09/09/2013    Echo post Inferolateral STEMI: Severely reduced LVEF ~20-35%; Akinesis of Inferolateral, Inferior & Inferoseptal walls; Grade 1 DDysfxn (initial LVEDP ~26 mmHg with SBP of 99 mHg)  . Essential hypertension 08/17/2012  . Hyperlipidemia with target LDL less than 70 08/17/2012  . Diabetes mellitus type 2 with complications 10/04/1749  . 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 Lovena Le    Past Surgical History  Procedure Laterality Date  . Coronary artery bypass graft    . Left heart catheterization with coronary angiogram N/A 09/09/2013    Procedure: LEFT HEART CATHETERIZATION WITH CORONARY ANGIOGRAM;  Surgeon: Leonie Man, MD;  Location[facility] CATH LAB;  Service: Cardiovascular;  Laterality: N/A;  . Implantable cardioverter defibrillator implant  02-21-2014    STJ single chamber ICD implanted by Dr Lovena Le for primary prevention  . Implantable cardioverter defibrillator implant N/A 02/21/2014    Procedure: IMPLANTABLE CARDIOVERTER DEFIBRILLATOR IMPLANT;  Surgeon: Evans Lance, MD;  Location: [facility] CATH LAB;  Service: Cardiovascular;  Laterality: N/A;    Family History  Problem Relation Age of Onset  . Diabetes Mother   . Diabetes Father     Social History Ms. Kats reports that she has quit smoking. Her smoking use included Cigarettes. She has never used smokeless tobacco. Ms. Torregrossa reports that she does not drink alcohol.  Review of Systems Complete review of systems negative except as otherwise outlined in the clinical summary and also the following. No fevers or chills. Still has some soreness in her right ankle. No orthopnea or PND.   Physical Examination Blood pressure 99/54, pulse 55, temperature 98.2 F (36.8 C), temperature source Oral, resp. rate 16, height 5\' 7"  (1.702 m), weight 167 lb 1.7 oz (75.8 kg), SpO2 100 %.  Intake/Output Summary (Last 24 hours) at 06/11/14 1455 Last data filed at 06/11/14 1332  Gross per 24 hour  Intake      0 ml  Output   1300 ml  Net  -1300 ml   Telemetry: Sinus rhythm   Patient appears comfortable at rest seen in bed side chair. HEENT: Conjunctiva and lids normal, oropharynx clear. Neck: Supple, no elevated JVP or carotid bruits, no thyromegaly. Lungs: Clear to auscultation, nonlabored breathing at rest. Thorax: Stable device  pocket site. Cardiac: Regular rate and rhythm, no S3 or significant systolic murmur, no pericardial rub. Abdomen: Soft, nontender, bowel sounds present. Extremities: No pitting edema with mild venous stasis, distal pulses 2+. Skin: Warm and dry. Musculoskeletal: No kyphosis. Neuropsychiatric: Alert and oriented x3, affect grossly appropriate.   Lab Results  Basic Metabolic Panel:  Recent Labs Lab 06/10/14 1433 06/10/14 1438 06/10/14 1610 06/10/14 1615 06/10/14 2228 06/11/14 0434  NA 128* 127* 129* 130*  --  134*  K >7.5* 7.5* 5.2* 5.1  --  5.0  CL 93* 98 98 101  --  99  CO2 19  --  18*  --   --  20  GLUCOSE 481* 489* 450* 477* 505* 315*  BUN 162* >140* 153* >140*  --  133*  CREATININE 4.17* 4.00* 3.67* 3.70*  --  3.49*  CALCIUM 9.4  --  9.2  --   --  9.3    Liver Function Tests:  Recent Labs Lab 06/10/14 1433 06/11/14 0434  AST 30 23  ALT 22 17  ALKPHOS 80 73  BILITOT 1.9*  0.8  PROT 7.2 6.8  ALBUMIN 3.5 3.0*    CBC:  Recent Labs Lab 06/10/14 1433 06/10/14 1438 06/10/14 1615 06/11/14 0434  WBC 13.8*  --   --  11.6*  NEUTROABS 12.4*  --   --   --   HGB 12.8 14.6 12.6 11.8*  HCT 37.7 43.0 37.0 35.2*  MCV 86.1  --   --  86.3  PLT 150  --   --  159    Cardiac Enzymes:  Recent Labs Lab 06/10/14 1808 06/10/14 2330 06/11/14 0434 06/11/14 1310  TROPONINI <0.03 <0.03 0.05* 0.07*    Imaging Echocardiogram 04/21/2014: Study Conclusions  - Left ventricle: The cavity size was normal. Wall thickness was increased in a pattern of mild LVH. Systolic function was severely reduced. The estimated ejection fraction was in the range of 20% to 25%. Diffuse hypokinesis. There is akinesis of the inferolateral, inferior, and inferoseptal myocardium. Doppler parameters are consistent with a reversible restrictive pattern, indicative of decreased left ventricular diastolic compliance and/or increased left atrial pressure (grade 3  diastolic dysfunction). - Aortic valve: There was mild regurgitation. - Mitral valve: Calcified annulus. There was moderate regurgitation. - Left atrium: The atrium was moderately dilated. - Pulmonary arteries: Systolic pressure was mildly increased. PA peak pressure: 38 mm Hg (S).  Impressions:  - Compared to the prior study, there has been no significant interval change.  Chest x-ray 04/18/2014: FINDINGS: Left single lead pacer tip is in the right ventricle, unchanged. Prior CABG. Mild cardiomegaly. No confluent opacities or effusions. No acute bony abnormality. Degenerative changes in the thoracic spine.  IMPRESSION: Cardiomegaly. No active disease.   Impression  1. Status post episode of chest pain, recent "indigestion" symptoms, possibly having some angina in the setting of hypoglycemia and known ischemic heart disease. Troponin I levels are minimally increased and overall less specific in the setting of chronic kidney disease. She is comfortable now. Her ECG did show transient ST segment abnormalities in the anterolateral leads which are suggestive of ischemia. The inferior changes look to be more chronic.   2. Multivessel CAD status post CABG with intervention in July 2015 in the setting of STEMI, BMS to the SVG to obtuse marginal system. She is on dual antiplatelet therapy.  3. Ischemic cardiomyopathy with LVEF 20-25%.  4. Diabetes mellitus, uncontrolled with significant hyperglycemia, recent reported steroid taper.  5. Status post ICD, followed by Dr. Lovena Le. No recent device discharges.  6. 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   Recommendations  Discussed with patient and daughter. At this point would recommend continuing aspirin, Plavix, and Toprol-XL. Would switch nitroglycerin paste to Imdur. Follow-up ECG in the morning. Primary service continues to work on more optimal glucose control which should help as well. We will follow her clinically prior to  determining if any additional cardiac studies are necessary.   Satira Sark, M.D., F.A.C.C.

## 2014-06-11 NOTE — Progress Notes (Signed)
PROGRESS NOTE  Jo Lynn OIZ:[id number] DOB: 11-08-1945 DOA: 06/10/2014 PCP: Chevis Pretty, FNP  HPI/Recap of past 57 hours: 69 year old female with past mental history of ischemic cardio myopathy and ejection fraction of 20-25 percent along with diabetes mellitus and stage IV chronic kidney disease who has been having issues with some right foot tendinitis and had been started on oral prednisone. Patient started feeling very weak yesterday at work and noted to have markedly elevated blood sugars in the 500s. Came into the emergency room in addition to hyperglycemia, although not in DKA, noted to have BUN of greater than 162 and a creatinine of 4.17.  Creatinine at 2.2 to only 9 days prior. Patient started on IV fluids and insulin.  Initial EKG unrevealing. After arrival to her room, she started not feeling well and had an episode of vomiting and some abdominal/chest discomfort. EKG done noted inverted T waves in the lateral leads.  Troponins cycled with no further elevations. CBGs improved to the 300s. With IV fluids, creatinine down to 3.49. Patient is self doing okay. She is not having any chest pain or shortness of breath. She does feel very tired.  Assessment/Plan: Principal Problem:   Diabetes mellitus type 2, uncontrolled: In discussion with patient, her CBGs appear to be overall well controlled. Awaiting A1c. Acute hyperglycemia secondary in the setting of prednisone Active Problems:   Essential hypertension: Continue Imdur for now. While she is volume deplete, holding metoprolol   EKG changes suggesting possible ischemia in patient with history of ischemic cardiomyopathy and poor ejection fraction: Have spoken to cardiology who will consult. Cycling enzymes.     Acute kidney injury in the setting of CKD (chronic kidney disease), stage IV: Patient on 60 mg of Lasix daily, but this was changed back in February. Likely much of this is acute renal failure brought on by  hyperglycemia. Gently hydrating patient and holding Lasix for now.     Tendonitis of ankle, right: Patient takes prednisone as needed. Would like to try to avoid anti-inflammatories given renal function, but will hold prednisone acutely given significant hyperglycemia    Code Status: DO NOT RESUSCITATE  Family Communication: Daughter at the bedside  Disposition Plan: Home once renal function and CBGs stabilized, also cleared by cardiology. Anticipate discharge within the next 24-48 hours   Consultants:  Cardiology  Procedures:  None  Antibiotics:  None   Objective: BP 99/54 mmHg  Pulse 55  Temp(Src) 98.2 F (36.8 C) (Oral)  Resp 16  Ht 5\' 7"  (1.702 m)  Wt 75.8 kg (167 lb 1.7 oz)  BMI 26.17 kg/m2  SpO2 100%  Intake/Output Summary (Last 24 hours) at 06/11/14 1455 Last data filed at 06/11/14 1332  Gross per 24 hour  Intake      0 ml  Output   1300 ml  Net  -1300 ml   Filed Weights   06/10/14 1310 06/11/14 0419  Weight: 77.293 kg (170 lb 6.4 oz) 75.8 kg (167 lb 1.7 oz)    Exam:   General:  Alert and oriented 3, no acute distress  Cardiovascular: Regular rate and rhythm, S1 and S2, 2/6 systolic ejection murmur  Respiratory: Clear to auscultation bilaterally  Abdomen: Soft, nontender, nondistended, positive bowel sounds  Musculoskeletal: No clubbing or cyanosis or edema  Data Reviewed: Basic Metabolic Panel:  Recent Labs Lab 06/10/14 1433 06/10/14 1438 06/10/14 1610 06/10/14 1615 06/10/14 2228 06/11/14 0434  NA 128* 127* 129* 130*  --  134*  K >7.5* 7.5* 5.2* 5.1  --  5.0  CL 93* 98 98 101  --  99  CO2 19  --  18*  --   --  20  GLUCOSE 481* 489* 450* 477* 505* 315*  BUN 162* >140* 153* >140*  --  133*  CREATININE 4.17* 4.00* 3.67* 3.70*  --  3.49*  CALCIUM 9.4  --  9.2  --   --  9.3   Liver Function Tests:  Recent Labs Lab 06/10/14 1433 06/11/14 0434  AST 30 23  ALT 22 17  ALKPHOS 80 73  BILITOT 1.9* 0.8  PROT 7.2 6.8  ALBUMIN  3.5 3.0*   No results for input(s): LIPASE, AMYLASE in the last 168 hours. No results for input(s): AMMONIA in the last 168 hours. CBC:  Recent Labs Lab 06/10/14 1433 06/10/14 1438 06/10/14 1615 06/11/14 0434  WBC 13.8*  --   --  11.6*  NEUTROABS 12.4*  --   --   --   HGB 12.8 14.6 12.6 11.8*  HCT 37.7 43.0 37.0 35.2*  MCV 86.1  --   --  86.3  PLT 150  --   --  159   Cardiac Enzymes:    Recent Labs Lab 06/10/14 1808 06/10/14 2330 06/11/14 0434 06/11/14 1310  TROPONINI <0.03 <0.03 0.05* 0.07*   BNP (last 3 results)  Recent Labs  02/15/14 2147 02/19/14 1303 04/18/14 1302  BNP 4826.4* 4653.0* >4500.0*    ProBNP (last 3 results)  Recent Labs  09/09/13 1055 09/11/13 0850 09/13/13 0850  PROBNP 2561.0* 9429.0* 2838.0*    CBG:  Recent Labs Lab 06/10/14 1326 06/10/14 1823 06/10/14 2113 06/11/14 0614 06/11/14 1124  GLUCAP 428* 385* 465* 273* 256*    No results found for this or any previous visit (from the past 240 hour(s)).   Studies: No results found.  Scheduled Meds: . allopurinol  50 mg Oral BID  . aspirin EC  81 mg Oral Daily  . clopidogrel  75 mg Oral Daily  . diclofenac sodium  2 g Topical QID  . docusate sodium  100 mg Oral BID  . fluticasone  2 spray Each Nare Daily  . folic acid  1 mg Oral Daily  . heparin  5,000 Units Subcutaneous 3 times per day  . insulin aspart  0-15 Units Subcutaneous TID WC  . insulin aspart  0-5 Units Subcutaneous QHS  . insulin glargine  10 Units Subcutaneous Daily  . metoprolol succinate  25 mg Oral BID  . nitroGLYCERIN  0.5 inch Topical 4 times per day  . pantoprazole  40 mg Oral Daily  . sodium chloride  3 mL Intravenous Q12H  . [START ON 06/12/2014] thiamine  100 mg Oral Daily    Continuous Infusions: . sodium chloride 50 mL/hr at 06/11/14 1342     Time spent: 35 minutes  [facility] Pager [phone]. If 7PM-7AM, please contact night-coverage at [email or web address], password  [facility] 06/11/2014, 2:55 PM  LOS: 1 day

## 2014-06-12 DIAGNOSIS — M6588 Other synovitis and tenosynovitis, other site: Secondary | ICD-10-CM

## 2014-06-12 DIAGNOSIS — R739 Hyperglycemia, unspecified: Secondary | ICD-10-CM | POA: Diagnosis not present

## 2014-06-12 DIAGNOSIS — N179 Acute kidney failure, unspecified: Secondary | ICD-10-CM | POA: Diagnosis present

## 2014-06-12 DIAGNOSIS — R072 Precordial pain: Secondary | ICD-10-CM

## 2014-06-12 DIAGNOSIS — N189 Chronic kidney disease, unspecified: Secondary | ICD-10-CM

## 2014-06-12 LAB — BASIC METABOLIC PANEL
Anion gap: 11 (ref 5–15)
BUN: 105 mg/dL — ABNORMAL HIGH (ref 6–23)
CALCIUM: 8.7 mg/dL (ref 8.4–10.5)
CHLORIDE: 106 mmol/L (ref 96–112)
CO2: 21 mmol/L (ref 19–32)
CREATININE: 2.5 mg/dL — AB (ref 0.50–1.10)
GFR calc Af Amer: 21 mL/min — ABNORMAL LOW (ref >90)
GFR, EST NON AFRICAN AMERICAN: 18 mL/min — AB (ref >90)
GLUCOSE: 173 mg/dL — AB (ref 70–99)
Potassium: 4.5 mmol/L (ref 3.5–5.1)
Sodium: 138 mmol/L (ref 135–145)

## 2014-06-12 LAB — GLUCOSE, CAPILLARY
Glucose-Capillary: 126 mg/dL — ABNORMAL HIGH (ref 70–99)
Glucose-Capillary: 146 mg/dL — ABNORMAL HIGH (ref 70–99)
Glucose-Capillary: 168 mg/dL — ABNORMAL HIGH (ref 70–99)

## 2014-06-12 LAB — TROPONIN I: Troponin I: 0.08 ng/mL — ABNORMAL HIGH (ref <0.031)

## 2014-06-12 MED ORDER — LIDOCAINE 4 % EX CREA
TOPICAL_CREAM | Freq: Every day | CUTANEOUS | Status: DC | PRN
  Filled 2014-06-12 (×4): qty 5

## 2014-06-12 NOTE — Progress Notes (Signed)
PROGRESS NOTE  Jo Lynn WUJ:[id number] DOB: 07/17/1945 DOA: 06/10/2014 PCP: Chevis Pretty, FNP  HPI/Recap of past 23 hours: 69 year old female with past mental history of ischemic cardio myopathy and ejection fraction of 20-25 percent along with diabetes mellitus and stage IV chronic kidney disease who has been having issues with some right foot tendinitis and had been started on oral prednisone. Patient started feeling very weak yesterday at work and noted to have markedly elevated blood sugars in the 500s. Came into the emergency room in addition to hyperglycemia, although not in DKA, noted to have BUN of greater than 162 and a creatinine of 4.17.  Creatinine at 2.2 to only 9 days prior. Patient started on IV fluids and insulin.  Initial EKG unrevealing. After arrival to her room, she started not feeling well and had an episode of vomiting and some abdominal/chest discomfort. EKG done noted inverted T waves in the lateral leads.  Troponins cycled with no further elevations.  Creatinine continues to improve down to 2.5 today. CBGs as well as , coming below 200. Patient continues to complain of some anterior foot pain.  Assessment/Plan: Principal Problem:   Diabetes mellitus type 2, uncontrolled: In discussion with patient, her CBGs appear to be overall well controlled. Awaiting A1c. Acute hyperglycemia secondary in the setting of prednisone. CBGs continue to improve , below 200 Active Problems:   Essential hypertension: Continue Imdur for now. While she is volume deplete, holding metoprolol. Noted hypotension this morning so fluids at 75 cc an hour   EKG changes suggesting possible ischemia in patient with history of ischemic cardiomyopathy and poor ejection fraction:  Cardiology following and changed Nitropaste to by mouth Imdur , holding until pressure stable Cycling enzymes.      Hypotension in the setting ofAcute kidney injury in the setting of CKD (chronic kidney disease), stage  IV: Patient on 60 mg of Lasix daily, but this was changed back in February. Likely much of this is acute renal failure brought on by hyperglycemia. Gently hydrating patient and holding Lasix for now. Creatinine down to 2.5. Continue gentle hydration and recheck tomorrow.     Tendonitis of ankle, right: Patient takes prednisone as needed. Would like to try to avoid anti-inflammatories given renal function, but will hold prednisone acutely given significant hyperglycemia. Try to also avoid diclofenac cream. Will try lidocaine cream.    Code Status: DO NOT RESUSCITATE  Family Communication:  Left message with daughter  Disposition Plan:  Anticipate discharge home tomorrow   Consultants:  Cardiology  Procedures:  None  Antibiotics:  None   Objective: BP 90/45 mmHg  Pulse 60  Temp(Src) 97.6 F (36.4 C) (Oral)  Resp 16  Ht 5\' 7"  (1.702 m)  Wt 77.7 kg (171 lb 4.8 oz)  BMI 26.82 kg/m2  SpO2 100%  Intake/Output Summary (Last 24 hours) at 06/12/14 1515 Last data filed at 06/12/14 1300  Gross per 24 hour  Intake    240 ml  Output    500 ml  Net   -260 ml   Filed Weights   06/10/14 1310 06/11/14 0419 06/12/14 0559  Weight: 77.293 kg (170 lb 6.4 oz) 75.8 kg (167 lb 1.7 oz) 77.7 kg (171 lb 4.8 oz)    Exam:  no change from previous  General:  Alert and oriented 3, no acute distress  Cardiovascular: Regular rate and rhythm, S1 and S2, 2/6 systolic ejection murmur  Respiratory: Clear to auscultation bilaterally  Abdomen: Soft, nontender, nondistended, positive bowel sounds  Musculoskeletal:  No clubbing or cyanosis or edema  Data Reviewed: Basic Metabolic Panel:  Recent Labs Lab 06/10/14 1433 06/10/14 1438 06/10/14 1610 06/10/14 1615 06/10/14 2228 06/11/14 0434 06/12/14 1010  NA 128* 127* 129* 130*  --  134* 138  K >7.5* 7.5* 5.2* 5.1  --  5.0 4.5  CL 93* 98 98 101  --  99 106  CO2 19  --  18*  --   --  20 21  GLUCOSE 481* 489* 450* 477* 505* 315* 173*    BUN 162* >140* 153* >140*  --  133* 105*  CREATININE 4.17* 4.00* 3.67* 3.70*  --  3.49* 2.50*  CALCIUM 9.4  --  9.2  --   --  9.3 8.7   Liver Function Tests:  Recent Labs Lab 06/10/14 1433 06/11/14 0434  AST 30 23  ALT 22 17  ALKPHOS 80 73  BILITOT 1.9* 0.8  PROT 7.2 6.8  ALBUMIN 3.5 3.0*   No results for input(s): LIPASE, AMYLASE in the last 168 hours. No results for input(s): AMMONIA in the last 168 hours. CBC:  Recent Labs Lab 06/10/14 1433 06/10/14 1438 06/10/14 1615 06/11/14 0434  WBC 13.8*  --   --  11.6*  NEUTROABS 12.4*  --   --   --   HGB 12.8 14.6 12.6 11.8*  HCT 37.7 43.0 37.0 35.2*  MCV 86.1  --   --  86.3  PLT 150  --   --  159   Cardiac Enzymes:    Recent Labs Lab 06/10/14 1808 06/10/14 2330 06/11/14 0434 06/11/14 1310 06/11/14 2300  TROPONINI <0.03 <0.03 0.05* 0.07* 0.08*   BNP (last 3 results)  Recent Labs  02/15/14 2147 02/19/14 1303 04/18/14 1302  BNP 4826.4* 4653.0* >4500.0*    ProBNP (last 3 results)  Recent Labs  09/09/13 1055 09/11/13 0850 09/13/13 0850  PROBNP 2561.0* 9429.0* 2838.0*    CBG:  Recent Labs Lab 06/11/14 1124 06/11/14 1602 06/11/14 2151 06/12/14 0657 06/12/14 1138  GLUCAP 256* 224* 183* 126* 146*    Recent Results (from the past 240 hour(s))  Urine culture     Status: None   Collection Time: 06/10/14  2:57 PM  Result Value Ref Range Status   Specimen Description URINE, CLEAN CATCH  Final   Special Requests NONE  Final   Colony Count   Final    2,000 COLONIES/ML Performed at [facility]    Culture   Final    INSIGNIFICANT GROWTH Performed at [facility]    Report Status 06/11/2014 FINAL  Final     Studies: No results found.  Scheduled Meds: . allopurinol  50 mg Oral BID  . aspirin EC  81 mg Oral Daily  . clopidogrel  75 mg Oral Daily  . diclofenac sodium  2 g Topical QID  . docusate sodium  100 mg Oral BID  . fluticasone  2 spray Each Nare Daily  . folic acid   1 mg Oral Daily  . heparin  5,000 Units Subcutaneous 3 times per day  . insulin aspart  0-15 Units Subcutaneous TID WC  . insulin aspart  0-5 Units Subcutaneous QHS  . insulin glargine  10 Units Subcutaneous Daily  . pantoprazole  40 mg Oral Daily  . sodium chloride  3 mL Intravenous Q12H  . thiamine  100 mg Oral Daily    Continuous Infusions: . sodium chloride 75 mL/hr at 06/12/14 0852     Time spent:  25 minutes  Ariyan Sinnett  K  [facility] Pager [phone]. If 7PM-7AM, please contact night-coverage at [email or web address], password [facility] 06/12/2014, 3:15 PM  LOS: 2 days

## 2014-06-12 NOTE — Progress Notes (Signed)
Primary cardiologist: Dr. Minus Breeding  Seen for followup: Chest pain  Subjective:    Patient states no further chest pain, no shortness of breath. Feels weak, slept well last night.  Objective:   Temp:  [97.9 F (36.6 C)-98.1 F (36.7 C)] 98 F (36.7 C) (04/17 0559) Pulse Rate:  [55-73] 55 (04/17 0719) Resp:  [18-20] 19 (04/17 0559) BP: (82-92)/(35-47) 82/39 mmHg (04/17 0719) SpO2:  [99 %-100 %] 99 % (04/17 0559) Weight:  [171 lb 4.8 oz (77.7 kg)] 171 lb 4.8 oz (77.7 kg) (04/17 0559) Last BM Date: 06/10/14  Filed Weights   06/10/14 1310 06/11/14 0419 06/12/14 0559  Weight: 170 lb 6.4 oz (77.293 kg) 167 lb 1.7 oz (75.8 kg) 171 lb 4.8 oz (77.7 kg)    Intake/Output Summary (Last 24 hours) at 06/12/14 1051 Last data filed at 06/12/14 0700  Gross per 24 hour  Intake    360 ml  Output   1000 ml  Net   -640 ml    Telemetry: Sinus rhythm.  Exam:  General: Appears comfortable.  Lungs: Clear, nonlabored.  Cardiac: RRR, no gallop.  Extremities: No pitting edema. Mild venous stasis.  Lab Results:  Basic Metabolic Panel:  Recent Labs Lab 06/10/14 1433  06/10/14 1610 06/10/14 1615 06/10/14 2228 06/11/14 0434  NA 128*  < > 129* 130*  --  134*  K >7.5*  < > 5.2* 5.1  --  5.0  CL 93*  < > 98 101  --  99  CO2 19  --  18*  --   --  20  GLUCOSE 481*  < > 450* 477* 505* 315*  BUN 162*  < > 153* >140*  --  133*  CREATININE 4.17*  < > 3.67* 3.70*  --  3.49*  CALCIUM 9.4  --  9.2  --   --  9.3  < > = values in this interval not displayed.  Liver Function Tests:  Recent Labs Lab 06/10/14 1433 06/11/14 0434  AST 30 23  ALT 22 17  ALKPHOS 80 73  BILITOT 1.9* 0.8  PROT 7.2 6.8  ALBUMIN 3.5 3.0*    CBC:  Recent Labs Lab 06/10/14 1433 06/10/14 1438 06/10/14 1615 06/11/14 0434  WBC 13.8*  --   --  11.6*  HGB 12.8 14.6 12.6 11.8*  HCT 37.7 43.0 37.0 35.2*  MCV 86.1  --   --  86.3  PLT 150  --   --  159    Cardiac Enzymes:  Recent Labs Lab  06/11/14 0434 06/11/14 1310 06/11/14 2300  TROPONINI 0.05* 0.07* 0.08*    BNP:  Recent Labs  09/09/13 1055 09/11/13 0850 09/13/13 0850  PROBNP 2561.0* 9429.0* 2838.0*    ECG: Sinus rhythm with old inferior infarct pattern and nonspecific ST changes.   Medications:   Scheduled Medications: . allopurinol  50 mg Oral BID  . aspirin EC  81 mg Oral Daily  . clopidogrel  75 mg Oral Daily  . diclofenac sodium  2 g Topical QID  . docusate sodium  100 mg Oral BID  . fluticasone  2 spray Each Nare Daily  . folic acid  1 mg Oral Daily  . heparin  5,000 Units Subcutaneous 3 times per day  . insulin aspart  0-15 Units Subcutaneous TID WC  . insulin aspart  0-5 Units Subcutaneous QHS  . insulin glargine  10 Units Subcutaneous Daily  . pantoprazole  40 mg Oral Daily  . sodium chloride  3 mL Intravenous Q12H  . thiamine  100 mg Oral Daily     Infusions: . sodium chloride 75 mL/hr at 06/12/14 0852     PRN Medications:  alum & mag hydroxide-simeth, nitroGLYCERIN, ondansetron **OR** ondansetron (ZOFRAN) IV   Assessment:   1. Episode of chest discomfort, also recent indigestion symptoms. Resolved at present. Cardiac enzymes are nonspecific, no definitive ACS. She did have transient ST segment changes as described yesterday suggestive of ischemia, possibly even transient posterior injury current. She does have a history of BMS to the SVG to obtuse marginal system in July 2015. Plan at this time is to continue medical therapy and observation.  2. 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  3. Type 2 diabetes mellitus with uncontrolled glucose, recently on steroids. Continuing to work on control per primary team.  4. Ischemic cardiomyopathy with ICD in place.   Plan/Discussion:    On aspirin and Plavix, Lopressor and Imdur were held recently with lower blood pressures. Follow-up ECG reviewed and stable. Continue to  follow.   Satira Sark, M.D., F.A.C.C.

## 2014-06-13 ENCOUNTER — Inpatient Hospital Stay (HOSPITAL_COMMUNITY): Payer: BLUE CROSS/BLUE SHIELD

## 2014-06-13 ENCOUNTER — Encounter (HOSPITAL_COMMUNITY)
Admission: EM | Disposition: A | Discharge status: Home / Self Care | Payer: Self-pay | Source: Home / Self Care | Attending: Internal Medicine

## 2014-06-13 ENCOUNTER — Other Ambulatory Visit: Payer: Self-pay

## 2014-06-13 ENCOUNTER — Encounter (HOSPITAL_COMMUNITY): Payer: Self-pay | Admitting: Interventional Cardiology

## 2014-06-13 ENCOUNTER — Encounter: Payer: BLUE CROSS/BLUE SHIELD | Admitting: Internal Medicine

## 2014-06-13 DIAGNOSIS — I2119 ST elevation (STEMI) myocardial infarction involving other coronary artery of inferior wall: Secondary | ICD-10-CM | POA: Diagnosis not present

## 2014-06-13 DIAGNOSIS — I25810 Atherosclerosis of coronary artery bypass graft(s) without angina pectoris: Secondary | ICD-10-CM

## 2014-06-13 DIAGNOSIS — I4891 Unspecified atrial fibrillation: Secondary | ICD-10-CM

## 2014-06-13 DIAGNOSIS — I480 Paroxysmal atrial fibrillation: Secondary | ICD-10-CM

## 2014-06-13 DIAGNOSIS — I959 Hypotension, unspecified: Secondary | ICD-10-CM | POA: Diagnosis not present

## 2014-06-13 DIAGNOSIS — I2121 ST elevation (STEMI) myocardial infarction involving left circumflex coronary artery: Secondary | ICD-10-CM

## 2014-06-13 HISTORY — PX: LEFT HEART CATHETERIZATION WITH CORONARY ANGIOGRAM: SHX5451

## 2014-06-13 LAB — BASIC METABOLIC PANEL
ANION GAP: 12 (ref 5–15)
ANION GAP: 15 (ref 5–15)
BUN: 78 mg/dL — ABNORMAL HIGH (ref 6–23)
BUN: 69 mg/dL — ABNORMAL HIGH (ref 6–23)
CALCIUM: 8.7 mg/dL (ref 8.4–10.5)
CALCIUM: 8.8 mg/dL (ref 8.4–10.5)
CO2: 19 mmol/L (ref 19–32)
CO2: 17 mmol/L — ABNORMAL LOW (ref 19–32)
Chloride: 110 mmol/L (ref 96–112)
Chloride: 109 mmol/L (ref 96–112)
Creatinine, Ser: 1.99 mg/dL — ABNORMAL HIGH (ref 0.50–1.10)
Creatinine, Ser: 1.85 mg/dL — ABNORMAL HIGH (ref 0.50–1.10)
GFR calc Af Amer: 31 mL/min — ABNORMAL LOW (ref >90)
GFR, EST AFRICAN AMERICAN: 28 mL/min — AB (ref >90)
GFR, EST NON AFRICAN AMERICAN: 24 mL/min — AB (ref >90)
GFR, EST NON AFRICAN AMERICAN: 26 mL/min — AB (ref >90)
Glucose, Bld: 95 mg/dL (ref 70–99)
Glucose, Bld: 178 mg/dL — ABNORMAL HIGH (ref 70–99)
Potassium: 4.5 mmol/L (ref 3.5–5.1)
Potassium: 4.1 mmol/L (ref 3.5–5.1)
SODIUM: 141 mmol/L (ref 135–145)
Sodium: 141 mmol/L (ref 135–145)

## 2014-06-13 LAB — CBC
HEMATOCRIT: 33.3 % — AB (ref 36.0–46.0)
HEMATOCRIT: 32.5 % — AB (ref 36.0–46.0)
HEMOGLOBIN: 10.9 g/dL — AB (ref 12.0–15.0)
Hemoglobin: 10.9 g/dL — ABNORMAL LOW (ref 12.0–15.0)
MCH: 28.6 pg (ref 26.0–34.0)
MCH: 28.9 pg (ref 26.0–34.0)
MCHC: 32.7 g/dL (ref 30.0–36.0)
MCHC: 33.5 g/dL (ref 30.0–36.0)
MCV: 87.4 fL (ref 78.0–100.0)
MCV: 86.2 fL (ref 78.0–100.0)
PLATELETS: 121 10*3/uL — AB (ref 150–400)
Platelets: 126 10*3/uL — ABNORMAL LOW (ref 150–400)
RBC: 3.81 MIL/uL — AB (ref 3.87–5.11)
RBC: 3.77 MIL/uL — ABNORMAL LOW (ref 3.87–5.11)
RDW: 16 % — ABNORMAL HIGH (ref 11.5–15.5)
RDW: 15.9 % — ABNORMAL HIGH (ref 11.5–15.5)
WBC: 7.5 10*3/uL (ref 4.0–10.5)
WBC: 9.9 10*3/uL (ref 4.0–10.5)

## 2014-06-13 LAB — POCT ACTIVATED CLOTTING TIME
ACTIVATED CLOTTING TIME: 208 s
Activated Clotting Time: 122 seconds
Activated Clotting Time: 134 seconds
Activated Clotting Time: 196 seconds
Activated Clotting Time: 208 seconds
Activated Clotting Time: 165 seconds

## 2014-06-13 LAB — POCT I-STAT 3, ART BLOOD GAS (G3+)
Acid-base deficit: 6 mmol/L — ABNORMAL HIGH (ref 0.0–2.0)
Bicarbonate: 18.4 mEq/L — ABNORMAL LOW (ref 20.0–24.0)
O2 SAT: 99 %
PO2 ART: 155 mmHg — AB (ref 80.0–100.0)
Patient temperature: 98.6
TCO2: 19 mmol/L (ref 0–100)
pCO2 arterial: 31.5 mmHg — ABNORMAL LOW (ref 35.0–45.0)
pH, Arterial: 7.374 (ref 7.350–7.450)

## 2014-06-13 LAB — GLUCOSE, CAPILLARY
GLUCOSE-CAPILLARY: 145 mg/dL — AB (ref 70–99)
GLUCOSE-CAPILLARY: 190 mg/dL — AB (ref 70–99)
Glucose-Capillary: 103 mg/dL — ABNORMAL HIGH (ref 70–99)
Glucose-Capillary: 165 mg/dL — ABNORMAL HIGH (ref 70–99)
Glucose-Capillary: 153 mg/dL — ABNORMAL HIGH (ref 70–99)

## 2014-06-13 LAB — TROPONIN I: TROPONIN I: 0.06 ng/mL — AB (ref <0.031)

## 2014-06-13 LAB — BRAIN NATRIURETIC PEPTIDE: B Natriuretic Peptide: 409.6 pg/mL — ABNORMAL HIGH (ref 0.0–100.0)

## 2014-06-13 LAB — HEMOGLOBIN A1C
HEMOGLOBIN A1C: 13.7 % — AB (ref 4.8–5.6)
MEAN PLASMA GLUCOSE: 346 mg/dL

## 2014-06-13 LAB — MRSA PCR SCREENING: MRSA by PCR: NEGATIVE

## 2014-06-13 SURGERY — LEFT HEART CATHETERIZATION WITH CORONARY ANGIOGRAM
Anesthesia: LOCAL

## 2014-06-13 MED ORDER — MIDAZOLAM HCL 2 MG/2ML IJ SOLN
INTRAMUSCULAR | Status: AC
  Filled 2014-06-13: qty 2

## 2014-06-13 MED ORDER — HEPARIN (PORCINE) IN NACL 2-0.9 UNIT/ML-% IJ SOLN
INTRAMUSCULAR | Status: AC
  Filled 2014-06-13: qty 1500

## 2014-06-13 MED ORDER — CLOPIDOGREL BISULFATE 75 MG PO TABS: 75.0000 mg | ORAL_TABLET | Freq: Every day | ORAL | Status: DC

## 2014-06-13 MED ORDER — CETYLPYRIDINIUM CHLORIDE 0.05 % MT LIQD
7.0000 mL | Freq: Two times a day (BID) | OROMUCOSAL | Status: DC
  Administered 2014-06-13: 7 mL via OROMUCOSAL

## 2014-06-13 MED ORDER — AMIODARONE HCL 150 MG/3ML IV SOLN
INTRAVENOUS | Status: AC
  Filled 2014-06-13: qty 3

## 2014-06-13 MED ORDER — AMIODARONE LOAD VIA INFUSION
150.0000 mg | Freq: Once | INTRAVENOUS | Status: DC
  Filled 2014-06-13: qty 83.34

## 2014-06-13 MED ORDER — AMIODARONE HCL IN DEXTROSE 360-4.14 MG/200ML-% IV SOLN
30.0000 mg/h | INTRAVENOUS | Status: DC
  Filled 2014-06-13: qty 200

## 2014-06-13 MED ORDER — ACETAMINOPHEN 325 MG PO TABS: 650.0000 mg | ORAL_TABLET | ORAL | Status: DC | PRN

## 2014-06-13 MED ORDER — AMIODARONE HCL IN DEXTROSE 360-4.14 MG/200ML-% IV SOLN
60.0000 mg/h | INTRAVENOUS | Status: DC
  Filled 2014-06-13: qty 200

## 2014-06-13 MED ORDER — AMIODARONE IV BOLUS ONLY 150 MG/100ML
150.0000 mg | Freq: Once | INTRAVENOUS | Status: DC
  Filled 2014-06-13: qty 100

## 2014-06-13 MED ORDER — SODIUM CHLORIDE 0.9 % IV SOLN
INTRAVENOUS | Status: DC
  Administered 2014-06-13: 10:00:00 via INTRAVENOUS

## 2014-06-13 MED ORDER — CLOPIDOGREL BISULFATE 300 MG PO TABS
ORAL_TABLET | ORAL | Status: AC
  Filled 2014-06-13: qty 1

## 2014-06-13 MED ORDER — HEPARIN SODIUM (PORCINE) 5000 UNIT/ML IJ SOLN: 5000.0000 [IU] | Freq: Three times a day (TID) | INTRAMUSCULAR | Status: DC

## 2014-06-13 MED ORDER — BIVALIRUDIN 250 MG IV SOLR
INTRAVENOUS | Status: AC
  Filled 2014-06-13: qty 250

## 2014-06-13 MED ORDER — HEPARIN SODIUM (PORCINE) 1000 UNIT/ML IJ SOLN
INTRAMUSCULAR | Status: AC
  Filled 2014-06-13: qty 2

## 2014-06-13 MED ORDER — AMIODARONE HCL IN DEXTROSE 360-4.14 MG/200ML-% IV SOLN
60.0000 mg/h | INTRAVENOUS | Status: DC
  Administered 2014-06-13: 60 mg/h via INTRAVENOUS
  Filled 2014-06-13: qty 200

## 2014-06-13 MED ORDER — SODIUM CHLORIDE 0.9 % IJ SOLN: 10.0000 mL | INTRAMUSCULAR | Status: DC | PRN

## 2014-06-13 MED ORDER — NITROGLYCERIN 1 MG/10 ML FOR IR/CATH LAB
INTRA_ARTERIAL | Status: AC
  Filled 2014-06-13: qty 10

## 2014-06-13 MED ORDER — SODIUM CHLORIDE 0.9 % IJ SOLN
10.0000 mL | Freq: Two times a day (BID) | INTRAMUSCULAR | Status: DC
Start: 2014-06-13 — End: 2014-06-16
  Administered 2014-06-13 – 2014-06-15 (×5): 10 mL

## 2014-06-13 MED ORDER — AMIODARONE HCL IN DEXTROSE 360-4.14 MG/200ML-% IV SOLN
30.0000 mg/h | INTRAVENOUS | Status: DC
  Administered 2014-06-13 – 2014-06-14 (×2): 30 mg/h via INTRAVENOUS
  Filled 2014-06-13 (×5): qty 200

## 2014-06-13 MED ORDER — LIDOCAINE HCL (PF) 1 % IJ SOLN
INTRAMUSCULAR | Status: AC
  Filled 2014-06-13: qty 30

## 2014-06-13 MED ORDER — ONDANSETRON HCL 4 MG/2ML IJ SOLN: 4.0000 mg | Freq: Four times a day (QID) | INTRAMUSCULAR | Status: DC | PRN

## 2014-06-13 MED ORDER — ASPIRIN 81 MG PO CHEW: 81.0000 mg | CHEWABLE_TABLET | Freq: Every day | ORAL | Status: DC

## 2014-06-13 MED ORDER — FENTANYL CITRATE (PF) 100 MCG/2ML IJ SOLN
INTRAMUSCULAR | Status: AC
  Filled 2014-06-13: qty 2

## 2014-06-13 MED ORDER — ASPIRIN 81 MG PO CHEW
CHEWABLE_TABLET | ORAL | Status: AC
  Filled 2014-06-13: qty 1

## 2014-06-13 MED ORDER — HEPARIN (PORCINE) IN NACL 100-0.45 UNIT/ML-% IJ SOLN
900.0000 [IU]/h | INTRAMUSCULAR | Status: AC
  Administered 2014-06-13: 950 [IU]/h via INTRAVENOUS
  Filled 2014-06-13: qty 250

## 2014-06-13 NOTE — Significant Event (Signed)
Rapid Response Event Note  Overview:  Nighttime RRT RN Gevena Barre called for CP unrelieved with NTG Time Called: 0650 Arrival Time: 4473 Event Type: Cardiac  Initial Focused Assessment:  On arrival patient supine in bed - warm and diaphoretic - alert and oriented - states her CP has now been relieved - staff reports 2 SL NTG given - patient had sudden onset CP which she called staff for with SOB and radiation to left shoulder - diaphoresis.  Bp soft 90/60 with SL NTG although staff reports she has lower end BP as normal.  Patient denies nausea. Bil BS present - clear.     Interventions: Staff placed nasal cannula O2 at 4 liters and did stat 12 lead EKG - they also administered 2 SL NTG tabs 5 minutes apart.  Patient states her pain is better although "I still feel something in my left shoulder".  Stat call to [facility] pta RRT.   12 lead shows some acute ST changes with elevation in V1 V2 V5 and V6 and some irregular rhythm.  Placed on Zoll pads with Zoll monitor.  Stat call to Dr. Maryland Pink with information.  [facility] phoned and STEMI called per [facility] RRT RN.  BP 104/57 HR now rapid 134 - looks like afib - patient reports pain is returning but not as bad - Spoke with Barbaraann Rondo in cath lab - Dr. Christ Kick present in cath lab - they have looked at 12 lead - stat transfer to cath lab via bed with Zoll - Dr. Christ Kick met [place] in hallway - to cath room - handoff to cath team - questions answered - Dr. Christ Kick speaking with daughter Aldona Bar.  Patient reassured thru event - details explained by Dr. Christ Kick on cath table.     Event Summary: Name of Physician Notified: [place] -1 extender Forrest Moron - then Dr. Laurence Slate after 0700 at  (see notes)  Name of Consulting Physician Notified: STEMI on call - Dr. Christ Kick met [place] in cath lab at  (with STEMI call at [phone])  Outcome: Transferred (Comment)  Event End Time: 0715 (transferred to cath lab team)  Quin Hoop

## 2014-06-13 NOTE — Progress Notes (Signed)
ANTICOAGULATION CONSULT NOTE - Initial Consult  Pharmacy Consult for Heparin Indication: post-cath/Afib in-cath  No Known Allergies  Patient Measurements: Height: 5\' 7"  (170.2 cm) Weight: 175 lb 11.3 oz (79.7 kg) IBW/kg (Calculated) : 61.6 Heparin Dosing Weight: 79kg  Vital Signs: Temp: 98.2 F (36.8 C) (04/18 1540) Temp Source: Oral (04/18 1540) BP: 104/55 mmHg (04/18 1540) Pulse Rate: 76 (04/18 1540)  Labs:  Recent Labs  06/11/14 0434 06/11/14 1310 06/11/14 2300 06/12/14 1010 06/13/14 0349 06/13/14 0925  HGB 11.8*  --   --   --  10.9* 10.9*  HCT 35.2*  --   --   --  33.3* 32.5*  PLT 159  --   --   --  126* 121*  CREATININE 3.49*  --   --  2.50* 1.99* 1.85*  TROPONINI 0.05* 0.07* 0.08*  --   --  0.06*    Estimated Creatinine Clearance: 30.7 mL/min (by C-G formula based on Cr of 1.85).   Medical History: Past Medical History  Diagnosis Date  . Myocardial infarction 10/2002    MV CAD --> Referred for CABG  . 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 Lucianne Lei Trigt: LIMA-LAD, SVG-RPDA, SVG-OM1-OM2  . ST elevation myocardial infarction (STEMI) of inferolateral wall 09/09/2013    SVG-Om1-2 occluded - PCI in 2 locations; Occluded SVG-RCA & native RCA, LAD & native Cx occcluded.  EF ~20%  . Cardiomyopathy, ischemic 09/09/2013    EF ~20-25%; Large MI, existing occluded RCA & SVG-RCA; h/o CABG  . 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 CAD S/P percutaneous coronary angioplasty 09/09/2013    mid SVG-OM1-OM2 (prox LIMB) Rebel BMS 4.0 mm x 16 mm; sequential limb OM1-OM2: Rebel BMS 4.0 mm x 12 mm  . Combined systolic and  diastolic congestive heart failure, NYHA class 3 09/09/2013    Echo post Inferolateral STEMI: Severely reduced LVEF ~20-35%; Akinesis of Inferolateral, Inferior & Inferoseptal walls; Grade 1 DDysfxn (initial LVEDP ~26 mmHg with SBP of 99 mHg)  . Essential hypertension 08/17/2012  . Hyperlipidemia with target LDL less than 70 08/17/2012  . Diabetes mellitus type 2 with complications 07/27/930  . 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 Lovena Le    Assessment: 37 yof with plan to start IV heparin s/p cath (STEMI) with in-cath DCCV for afib. IV heparin to start 6h post-sheath removal (1530) at 2130. Hg stable 10.9, plt low, decreased 158>>126. No bleed issues per RN.  Goal of Therapy:  Heparin level 0.3-0.7 units/ml Monitor platelets by anticoagulation protocol: Yes   Plan:  Start IV heparin at 2130 No bolus Start Heparin @950  units/hr 8h HL Daily HL/CBC Mon s/sx bleeding  Elicia Lamp, PharmD Clinical Pharmacist - Resident Pager [phone] 06/13/2014 4:36 PM

## 2014-06-13 NOTE — Progress Notes (Signed)
Peripherally Inserted Central Catheter/Midline Placement  The IV Nurse has discussed with the patient and/or persons authorized to consent for the patient, the purpose of this procedure and the potential benefits and risks involved with this procedure.  The benefits include less needle sticks, lab draws from the catheter and patient may be discharged home with the catheter.  Risks include, but not limited to, infection, bleeding, blood clot (thrombus formation), and puncture of an artery; nerve damage and irregular heat beat.  Alternatives to this procedure were also discussed.  PICC/Midline Placement Documentation        Jo Lynn 06/13/2014, 2:28 PM

## 2014-06-13 NOTE — CV Procedure (Signed)
PROCEDURE:  Left heart catheterization with selective coronary angiography, bypass angiography, PCI of SVG to OM, D/C cardioversion.  INDICATIONS:  Acute inferoposterior STEMI, Atrial fibrillation  The risks, benefits, and details of the procedure were explained to the patient.  The patient verbalized understanding and wanted to proceed.  Informed written consent was obtained.  PROCEDURE TECHNIQUE:  After Xylocaine anesthesia a 14F sheath was placed in the right femoral artery with a single anterior needle wall stick.   SVG angiography was performed using an AL-1 guide catheter. A 5 French venous sheath was placed after it was found that the peripheral IVs were not functioning properly. IV amiodarone was started to help maintain normal sinus rhythm.  Left heart catheterization was done using a pigtail catheter. Manual compression will be used for hemostasis.   CONTRAST:  Total of 20 cc.  COMPLICATIONS:  None.    HEMODYNAMICS:  Aortic pressure was 64/35; LV pressure was 63/1; LVEDP 5.  There was no gradient between the left ventricle and aorta.    ANGIOGRAPHIC DATA:   The SVG to circumflex graft is patent proximally. There is a stent in the mid graft which has mild in-stent restenosis. There is a stent in the distal graft which has a 99% area of in-stent restenosis. After intervention, it was noted that there are collaterals to the RCA system from the distal circumflex. These collaterals were not as prominent prior to the intervention.  LEFT VENTRICULOGRAM:  Left ventricular angiogram was not done due to the concern for contrast nephropathy.  LVEDP was 5 mmHg.  PCI NARRATIVE: An AL-1 guide catheter was used to engage the SVG to OM. A cougar wire was placed across this lesion. A 2.0 x 10 Angiosculpt scoring balloon was then advanced and inflated to high pressure. This improved the stenosis. A 3.5 x 10 Angiosculpt scoring balloon was then advanced and inflated to high pressure. There was no  significant residual stenosis. Due to her renal insufficiency, we did not want to give any additional contrast if possible.  The patient was still hypotensive and was in atrial fibrillation with rapid ventricular response. Review of the medical records show that this was a new finding for her. She was given some sedation. A single biphasic 150 J shock was administered with successful restoration of normal sinus rhythm. Her blood pressure immediately improved to the normal range. At the end of the procedure, she did not have any chest discomfort.  IMPRESSIONS:   1. 99% in-stent restenosis in the distal SVG to circumflex artery  graft which was the culprit for today's presentation. This was successfully treated with a 2.0 x 10 Angiosculpt scoring balloon inflated to high pressure. This was followed by a 3.5 x 10 Angiosculpt scoring balloon inflated to high pressure. 2. LVEDP 5 mmHg.   3.    Successful DC cardioversion of atrial fibrillation to normal sinus rhythm. This was performed emergently due to hypotension.   RECOMMENDATION:  The patient did not have a new stent placed today. She will need continued aspirin and Plavix given her prio stents and diffuse coronary disease.  Some of her ischemia this morning was likely due to her rapid heart rate in the setting of a severe lesion in her graft. To avoid excessive contrast use we did not inject the other grafts which had been imaged in June 2015. At the end of the procedure, the patient was chest discomfort free.  Will continue IV amiodarone for now. Would likely resume heparin 4-6 hours  after the sheath pull given the recent synchronized defibrillation for atrial fibrillation.

## 2014-06-13 NOTE — Progress Notes (Signed)
After an uneventful night the patient called out at ~ 0650 reporting severe chest pain.  Upon assessment the patient was diaphoretic, breathing rapidly, and writhing in pain. Patient placed on Oxygen via nasal cannula at 2L/min, Nitroglycerin tablets given X 2 5 minutes apart with relief of the severe pain but patient continued to complain of soreness in the left chest and flank area. Peripheral IV placed in Left AC. EKG showed "critical STEMI".  Rapid response called to the patient's bedside.  Dr. Maryland Pink notified and ordered STAT troponin, BNP, BMET, CBC, ABG. Cardiology also paged. Patient taken to the CATH lab emergently.  Also, the patient did revoke her DNR status in the presence of multiple witnesses.  Patient's daughter, Arieona Swaggerty notified of the above and stated that she was on her way to the hospital.  Patient's belongings packed and placed at the nurse's station.

## 2014-06-13 NOTE — Progress Notes (Signed)
PT Cancellation Note  Patient Details Name: Jo Lynn MRN: [id number] DOB: 07/27/1945   Cancelled Treatment:    Reason Eval/Treat Not Completed: Medical issues which prohibited therapy. Per chart review, pt taken to the cath lab emergently after EKG showed a "critical STEMI" this morning. Will hold PT treatment today and check back tomorrow for medical appropriateness to participate.    Rolinda Roan 06/13/2014, 9:35 AM   Rolinda Roan, PT, DPT Acute Rehabilitation Services Pager: [phone]

## 2014-06-13 NOTE — Progress Notes (Signed)
Site area: RFA Site Prior to Removal:  Level  0 Pressure Applied For: 48min Manual:   yes Patient Status During Pull: stable  Post Pull Site:  Level 0 Post Pull Instructions Given:  yes Post Pull Pulses Present: weakly palpable Dressing Applied:  clear Bedrest begins @ 1540 Comments:

## 2014-06-13 NOTE — Progress Notes (Signed)
PROGRESS NOTE  Jo Lynn ZOX:[id number] DOB: 1945-05-05 DOA: 06/10/2014 PCP: Chevis Pretty, FNP  HPI/Recap of past 52 hours: 69 year old female with past mental history of ischemic cardio myopathy and ejection fraction of 20-25 percent along with diabetes mellitus and stage IV chronic kidney disease who has been having issues with some right foot tendinitis and had been started on oral prednisone. Patient started feeling very weak yesterday at work and noted to have markedly elevated blood sugars in the 500s. Came into the emergency room in addition to hyperglycemia, although not in DKA, noted to have BUN of greater than 162 and a creatinine of 4.17.  Creatinine at 2.2 to only 9 days prior. Patient started on IV fluids and insulin.  Initial EKG unrevealing. After arrival to her room, she started not feeling well and had an episode of vomiting and some abdominal/chest discomfort. EKG done noted inverted T waves in the lateral leads.  Over the next few days, patient's CBGs were stable, staying below 200. Creatinine continued to trend down. On morning of 4/18 patient started having severe chest pain.  EKG done showed ST elevations in the inferior and lateral leads with reciprocal ST inversions in the anterior leads. Patient felt to be having a STEMI and code STEMI called. Patient rushed urgently to the cardiac catheterization lab. Prior to this, patient decided to be full code. In the cardiac catheterization lab, she was found to have 99% blockage in the distal SVG to circumflex artery graft which was able to be successfully ballooned. Patient also had episodes of atrial fibrillation and underwent successful DC cardioversion which was done emergently due to hypotension. She's been transferred to the stepdown unit.  Patient seen several hours after Cath Lab. Doing well. Tired. No chest pain or shortness of breath.  Assessment/Plan: Principal Problem:   Diabetes mellitus type 2, uncontrolled:  In discussion with patient, her CBGs appear to be overall well controlled. Awaiting A1c. Acute hyperglycemia secondary in the setting of prednisone. CBGs are now stable. Active Problems:   Essential hypertension: Continue Imdur for now. While she is volume deplete, holding metoprolol. Medicines held as procedure due to hypotension and we restarted as per cardiology  Non-ST elevated MI and prior to this  EKG changes suggesting possible ischemia in patient with history of ischemic cardiomyopathy and poor ejection fraction:  Status post balloon angioplasty. Appreciate cardiology's immediate response. Keep patient stepdown for now. Will need better IV access so Place PICC line. Continue aspirin and Plavix long-term, no new stents put in  Atrial fibrillation: Cardioverted due to unstable hypotension. Continue on amiodarone drip.     Hypotension in the setting ofAcute kidney injury in the setting of CKD (chronic kidney disease), stage IV: Patient on 60 mg of Lasix daily, but this was changed back in February. Likely much of this is acute renal failure brought on by hyperglycemia. Gently hydrating patient and holding Lasix for now. Creatinine down to 1.85 prior to STEMI. Expect it should go up some slightly due to cath dye. Continue gentle hydration and recheck tomorrow.     Tendonitis of ankle, right: Patient takes prednisone as needed. Would like to try to avoid anti-inflammatories given renal function, but will hold prednisone acutely given significant hyperglycemia. Try to also avoid diclofenac cream. Will try lidocaine cream.    Code Status: DO NOT RESUSCITATE  Family Communication:  Multiple family members including daughter at the bedside  Disposition Plan:  Continued stepdown, possible transfer out of stepdown tomorrow, as per cardiology  Consultants:  Cardiology  Procedures:  Status post balloon angioplasty done 4/18  Status post cardioversion of atrial fibrillation successfully done  4/18  Placement of PICC line done 4/18-pending  Antibiotics:  None   Objective: BP 104/53 mmHg  Pulse 64  Temp(Src) 97.4 F (36.3 C) (Oral)  Resp 18  Ht 5\' 7"  (1.702 m)  Wt 79.7 kg (175 lb 11.3 oz)  BMI 27.51 kg/m2  SpO2 100%  Intake/Output Summary (Last 24 hours) at 06/13/14 1301 Last data filed at 06/13/14 1200  Gross per 24 hour  Intake 1012.42 ml  Output    825 ml  Net 187.42 ml   Filed Weights   06/12/14 0559 06/13/14 0411 06/13/14 0930  Weight: 77.7 kg (171 lb 4.8 oz) 78.6 kg (173 lb 4.5 oz) 79.7 kg (175 lb 11.3 oz)    Exam:   General:  Alert and oriented 3, fatigued  Cardiovascular: Regular rate and rhythm, S1 and S2, 2/6 systolic ejection murmur  Respiratory: Clear to auscultation bilaterally  Abdomen: Soft, nontender, nondistended, positive bowel sounds  Musculoskeletal: No clubbing or cyanosis, trace edema  Data Reviewed: Basic Metabolic Panel:  Recent Labs Lab 06/10/14 1610 06/10/14 1615 06/10/14 2228 06/11/14 0434 06/12/14 1010 06/13/14 0349 06/13/14 0925  NA 129* 130*  --  134* 138 141 141  K 5.2* 5.1  --  5.0 4.5 4.5 4.1  CL 98 101  --  99 106 110 109  CO2 18*  --   --  20 21 19  17*  GLUCOSE 450* 477* 505* 315* 173* 95 178*  BUN 153* >140*  --  133* 105* 78* 69*  CREATININE 3.67* 3.70*  --  3.49* 2.50* 1.99* 1.85*  CALCIUM 9.2  --   --  9.3 8.7 8.7 8.8   Liver Function Tests:  Recent Labs Lab 06/10/14 1433 06/11/14 0434  AST 30 23  ALT 22 17  ALKPHOS 80 73  BILITOT 1.9* 0.8  PROT 7.2 6.8  ALBUMIN 3.5 3.0*   No results for input(s): LIPASE, AMYLASE in the last 168 hours. No results for input(s): AMMONIA in the last 168 hours. CBC:  Recent Labs Lab 06/10/14 1433 06/10/14 1438 06/10/14 1615 06/11/14 0434 06/13/14 0349 06/13/14 0925  WBC 13.8*  --   --  11.6* 7.5 9.9  NEUTROABS 12.4*  --   --   --   --   --   HGB 12.8 14.6 12.6 11.8* 10.9* 10.9*  HCT 37.7 43.0 37.0 35.2* 33.3* 32.5*  MCV 86.1  --   --  86.3 87.4  86.2  PLT 150  --   --  159 126* 121*   Cardiac Enzymes:    Recent Labs Lab 06/10/14 2330 06/11/14 0434 06/11/14 1310 06/11/14 2300 06/13/14 0925  TROPONINI <0.03 0.05* 0.07* 0.08* 0.06*   BNP (last 3 results)  Recent Labs  02/19/14 1303 04/18/14 1302 06/13/14 0349  BNP 4653.0* >4500.0* 409.6*    ProBNP (last 3 results)  Recent Labs  09/09/13 1055 09/11/13 0850 09/13/13 0850  PROBNP 2561.0* 9429.0* 2838.0*    CBG:  Recent Labs Lab 06/12/14 1138 06/12/14 1621 06/12/14 2100 06/13/14 0549 06/13/14 1224  GLUCAP 146* 168* 145* 103* 165*    Recent Results (from the past 240 hour(s))  Urine culture     Status: None   Collection Time: 06/10/14  2:57 PM  Result Value Ref Range Status   Specimen Description URINE, CLEAN CATCH  Final   Special Requests NONE  Final   Colony Count  Final    2,000 COLONIES/ML Performed at [facility]    Culture   Final    INSIGNIFICANT GROWTH Performed at [facility]    Report Status 06/11/2014 FINAL  Final  MRSA PCR Screening     Status: None   Collection Time: 06/13/14  9:02 AM  Result Value Ref Range Status   MRSA by PCR NEGATIVE NEGATIVE Final    Comment:        The GeneXpert MRSA Assay (FDA approved for NASAL specimens only), is one component of a comprehensive MRSA colonization surveillance program. It is not intended to diagnose MRSA infection nor to guide or monitor treatment for MRSA infections.      Studies: No results found.  Scheduled Meds: . allopurinol  50 mg Oral BID  . amiodarone  150 mg Intravenous Once  . antiseptic oral rinse  7 mL Mouth Rinse BID  . aspirin EC  81 mg Oral Daily  . clopidogrel  75 mg Oral Daily  . docusate sodium  100 mg Oral BID  . fluticasone  2 spray Each Nare Daily  . folic acid  1 mg Oral Daily  . heparin  5,000 Units Subcutaneous 3 times per day  . insulin aspart  0-15 Units Subcutaneous TID WC  . insulin aspart  0-5 Units Subcutaneous QHS  .  insulin glargine  10 Units Subcutaneous Daily  . pantoprazole  40 mg Oral Daily  . sodium chloride  3 mL Intravenous Q12H  . thiamine  100 mg Oral Daily    Continuous Infusions: . sodium chloride 75 mL/hr (06/12/14 2351)  . sodium chloride 10 mL/hr at 06/13/14 0945  . amiodarone 60 mg/hr (06/13/14 0942)   Followed by  . amiodarone       Time spent:  25 minutes  [facility] Pager [phone]. If 7PM-7AM, please contact night-coverage at [email or web address], password [facility] 06/13/2014, 1:01 PM  LOS: 3 days

## 2014-06-14 DIAGNOSIS — N189 Chronic kidney disease, unspecified: Secondary | ICD-10-CM

## 2014-06-14 DIAGNOSIS — N179 Acute kidney failure, unspecified: Secondary | ICD-10-CM

## 2014-06-14 DIAGNOSIS — I2111 ST elevation (STEMI) myocardial infarction involving right coronary artery: Secondary | ICD-10-CM

## 2014-06-14 LAB — GLUCOSE, CAPILLARY
GLUCOSE-CAPILLARY: 108 mg/dL — AB (ref 70–99)
GLUCOSE-CAPILLARY: 231 mg/dL — AB (ref 70–99)
Glucose-Capillary: 130 mg/dL — ABNORMAL HIGH (ref 70–99)
Glucose-Capillary: 290 mg/dL — ABNORMAL HIGH (ref 70–99)

## 2014-06-14 LAB — BASIC METABOLIC PANEL
Anion gap: 10 (ref 5–15)
BUN: 44 mg/dL — AB (ref 6–23)
CO2: 23 mmol/L (ref 19–32)
CREATININE: 1.59 mg/dL — AB (ref 0.50–1.10)
Calcium: 9 mg/dL (ref 8.4–10.5)
Chloride: 108 mmol/L (ref 96–112)
GFR, EST AFRICAN AMERICAN: 37 mL/min — AB (ref >90)
GFR, EST NON AFRICAN AMERICAN: 32 mL/min — AB (ref >90)
GLUCOSE: 114 mg/dL — AB (ref 70–99)
Potassium: 4 mmol/L (ref 3.5–5.1)
Sodium: 141 mmol/L (ref 135–145)

## 2014-06-14 LAB — CBC
HEMATOCRIT: 31.9 % — AB (ref 36.0–46.0)
HEMOGLOBIN: 10.7 g/dL — AB (ref 12.0–15.0)
MCH: 29.3 pg (ref 26.0–34.0)
MCHC: 33.5 g/dL (ref 30.0–36.0)
MCV: 87.4 fL (ref 78.0–100.0)
Platelets: 134 10*3/uL — ABNORMAL LOW (ref 150–400)
RBC: 3.65 MIL/uL — AB (ref 3.87–5.11)
RDW: 16 % — ABNORMAL HIGH (ref 11.5–15.5)
WBC: 8.9 10*3/uL (ref 4.0–10.5)

## 2014-06-14 LAB — HEPARIN LEVEL (UNFRACTIONATED): HEPARIN UNFRACTIONATED: 0.62 [IU]/mL (ref 0.30–0.70)

## 2014-06-14 MED ORDER — ATORVASTATIN CALCIUM 40 MG PO TABS
40.0000 mg | ORAL_TABLET | Freq: Every day | ORAL | Status: DC
  Administered 2014-06-14 – 2014-06-15 (×2): 40 mg via ORAL
  Filled 2014-06-14 (×3): qty 1

## 2014-06-14 MED ORDER — CARVEDILOL 3.125 MG PO TABS
3.1250 mg | ORAL_TABLET | Freq: Two times a day (BID) | ORAL | Status: DC
  Administered 2014-06-14 – 2014-06-15 (×2): 3.125 mg via ORAL
  Filled 2014-06-14 (×6): qty 1

## 2014-06-14 MED ORDER — LINAGLIPTIN 5 MG PO TABS
5.0000 mg | ORAL_TABLET | Freq: Every day | ORAL | Status: DC
  Administered 2014-06-14 – 2014-06-16 (×3): 5 mg via ORAL
  Filled 2014-06-14 (×3): qty 1

## 2014-06-14 MED ORDER — APIXABAN 5 MG PO TABS
5.0000 mg | ORAL_TABLET | Freq: Two times a day (BID) | ORAL | Status: DC
  Administered 2014-06-14 – 2014-06-16 (×5): 5 mg via ORAL
  Filled 2014-06-14 (×6): qty 1

## 2014-06-14 MED ORDER — ATORVASTATIN CALCIUM 80 MG PO TABS
80.0000 mg | ORAL_TABLET | Freq: Every day | ORAL | Status: DC
  Filled 2014-06-14: qty 1

## 2014-06-14 NOTE — Progress Notes (Signed)
Brief Nutrition Note  Received consult for DM diet education. Attempted to educate pt x2, however, pt was sound asleep or not in room at time of visit. Per DM coordinator, elevation of Hgb A1c is likely due to steroid use.  Provided AND Nutrtion Care Manual's "Carbohydrate Counting for People With Diabetes" on bedside table. Will follow-up on 06/15/14 or 06/16/14 to see if pt has additional diet related questions.  Atley Neubert A. Jimmye Norman, RD, LDN, CDE Pager: [phone] After hours Pager: [phone]

## 2014-06-14 NOTE — Progress Notes (Signed)
ANTICOAGULATION CONSULT NOTE  Pharmacy Consult for Heparin Indication: post-cath/Afib in-cath  No Known Allergies  Patient Measurements: Height: 5\' 7"  (170.2 cm) Weight: 175 lb 11.3 oz (79.7 kg) IBW/kg (Calculated) : 61.6 Heparin Dosing Weight: 79kg  Vital Signs: Temp: 98.3 F (36.8 C) (04/19 0300) Temp Source: Oral (04/19 0300) BP: 117/65 mmHg (04/19 0600) Pulse Rate: 63 (04/19 0600)  Labs:  Recent Labs  06/11/14 1310 06/11/14 2300  06/13/14 0349 06/13/14 0925 06/14/14 0355  HGB  --   --   < > 10.9* 10.9* 10.7*  HCT  --   --   --  33.3* 32.5* 31.9*  PLT  --   --   --  126* 121* 134*  HEPARINUNFRC  --   --   --   --   --  0.62  CREATININE  --   --   < > 1.99* 1.85* 1.59*  TROPONINI 0.07* 0.08*  --   --  0.06*  --   < > = values in this interval not displayed.  Estimated Creatinine Clearance: 35.8 mL/min (by C-G formula based on Cr of 1.59).   Medical History: Past Medical History  Diagnosis Date  . Myocardial infarction 10/2002    MV CAD --> Referred for CABG  . 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 Lucianne Lei Trigt: LIMA-LAD, SVG-RPDA, SVG-OM1-OM2  . ST elevation myocardial infarction (STEMI) of inferolateral wall 09/09/2013    SVG-Om1-2 occluded - PCI in 2 locations; Occluded SVG-RCA & native RCA, LAD & native Cx occcluded.  EF ~20%  . Cardiomyopathy, ischemic 09/09/2013    EF ~20-25%; Large MI, existing occluded RCA & SVG-RCA; h/o CABG  . 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 CAD S/P percutaneous coronary angioplasty 09/09/2013    mid SVG-OM1-OM2 (prox LIMB) Rebel BMS 4.0 mm x 16 mm; sequential limb OM1-OM2:  Rebel BMS 4.0 mm x 12 mm  . Combined systolic and diastolic congestive heart failure, NYHA class 3 09/09/2013    Echo post Inferolateral STEMI: Severely reduced LVEF ~20-35%; Akinesis of Inferolateral, Inferior & Inferoseptal walls; Grade 1 DDysfxn (initial LVEDP ~26 mmHg with SBP of 99 mHg)  . Essential hypertension 08/17/2012  . Hyperlipidemia with target LDL less than 70 08/17/2012  . Diabetes mellitus type 2 with complications 4/88/8916  . 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 Lovena Le    Assessment: 89 yof continuing on IV heparin s/p cath (STEMI) with in-cath DCCV for afib. IV heparin started 6h post-sheath removal. First HL therapeutic (0.62) @950  units/hr. CBC stable. No bleed issues per RN.  Goal of Therapy:  Heparin level 0.3-0.7 units/ml Monitor platelets by anticoagulation protocol: Yes   Plan:  Decrease Heparin IV slightly to 900 units/hr 8h HL Daily HL/CBC Mon s/sx bleeding  Elicia Lamp, PharmD Clinical Pharmacist - Resident Pager [phone] 06/14/2014 6:42 AM

## 2014-06-14 NOTE — Progress Notes (Signed)
SUBJECTIVE:  Denies any chest pain  OBJECTIVE:   Vitals:   Filed Vitals:   06/14/14 0500 06/14/14 0600 06/14/14 0724 06/14/14 0909  BP: 116/48 117/65 108/51   Pulse: 60 63 79 114  Temp:   98.2 F (36.8 C)   TempSrc:   Oral   Resp:   17   Height:      Weight:      SpO2: 100% 100% 100%    I&O's:   Intake/Output Summary (Last 24 hours) at 06/14/14 1110 Last data filed at 06/14/14 0900  Gross per 24 hour  Intake 3166.8 ml  Output   1000 ml  Net 2166.8 ml   TELEMETRY: Reviewed telemetry pt in NSR:     PHYSICAL EXAM General: Well developed, well nourished, in no acute distress Head: Eyes PERRLA, No xanthomas.   Normal cephalic and atramatic  Lungs:   Clear bilaterally to auscultation and percussion. Heart:   HRRR S1 S2 Pulses are 2+ & equal. Abdomen: Bowel sounds are positive, abdomen soft and non-tender without masses  Extremities:   No clubbing, cyanosis or edema.  DP +1 Neuro: Alert and oriented X 3. Psych:  Good affect, responds appropriately   LABS: Basic Metabolic Panel:  Recent Labs  06/13/14 0925 06/14/14 0355  NA 141 141  K 4.1 4.0  CL 109 108  CO2 17* 23  GLUCOSE 178* 114*  BUN 69* 44*  CREATININE 1.85* 1.59*  CALCIUM 8.8 9.0   Liver Function Tests: No results for input(s): AST, ALT, ALKPHOS, BILITOT, PROT, ALBUMIN in the last 72 hours. No results for input(s): LIPASE, AMYLASE in the last 72 hours. CBC:  Recent Labs  06/13/14 0925 06/14/14 0355  WBC 9.9 8.9  HGB 10.9* 10.7*  HCT 32.5* 31.9*  MCV 86.2 87.4  PLT 121* 134*   Cardiac Enzymes:  Recent Labs  06/11/14 1310 06/11/14 2300 06/13/14 0925  TROPONINI 0.07* 0.08* 0.06*   BNP: Invalid input(s): POCBNP D-Dimer: No results for input(s): DDIMER in the last 72 hours. Hemoglobin A1C: No results for input(s): HGBA1C in the last 72 hours. Fasting Lipid Panel: No results for input(s): CHOL, HDL, LDLCALC, TRIG, CHOLHDL, LDLDIRECT in the last 72 hours. Thyroid Function Tests: No  results for input(s): TSH, T4TOTAL, T3FREE, THYROIDAB in the last 72 hours.  Invalid input(s): FREET3 Anemia Panel: No results for input(s): VITAMINB12, FOLATE, FERRITIN, TIBC, IRON, RETICCTPCT in the last 72 hours. Coag Panel:   Lab Results  Component Value Date   INR 1.36 04/18/2014   INR 1.16 09/10/2013   INR 1.33 09/09/2013    RADIOLOGY: Dg Chest Port 1 View  06/13/2014   CLINICAL DATA:  Status post PICC line placement.  EXAM: PORTABLE CHEST - 1 VIEW  COMPARISON:  April 18, 2014.  FINDINGS: Stable cardiomediastinal silhouette. Status post coronary artery bypass graft. Left-sided pacemaker is unchanged in position. No pneumothorax or pleural effusion is noted. No acute pulmonary disease is noted. There has been interval placement of right-sided PICC line with distal tip overlying expected position of SVC.  IMPRESSION: Interval placement of right-sided PICC line with distal tip overlying expected position of SVC. No acute cardiopulmonary abnormality seen.   Electronically Signed   By: Marijo Conception, M.D.   On: 06/13/2014 15:17   Impression  1. S/P acute inferoposterior STEMI - had been having episodes of chest pain, recent "indigestion" symptoms, possibly having some angina in the setting of hypoglycemia and known ischemic heart disease. Troponin I levels was minimally increased on  admit and overall less specific in the setting of chronic kidney disease. She had recurrent chest pain with ST segment abnormalities in the anterolateral leads suggestive of ischemia in setting of rapid atrial fibrillation. Taken to cath lab for emergent cath and found to have 99% in-stent restenosis of the distal SVG to LCX graft and underwent PTCA with no stent placement.  She had emergent DCCV as well.  Continue Plavix/statin.  Start low dose Coreg.    2. Multivessel CAD status post CABG with intervention in July 2015 in the setting of STEMI, BMS to the SVG to obtuse marginal system. She is currently on dual  antiplatelet therapy.  Now s/p angioplasty with no stent to SVG to LCX.  Will stop ASA and continue Plavix since she is being started on Eliquis for PAF.  This was discussed with Dr. Irish Lack.  Her renal function will need to be monitored closely.  Her Creatinine is 1.5 today but has been as high as 3.4 in the past.    3. Ischemic cardiomyopathy with LVEF 20-25%.  Start low dose coreg.  Hold on restarting ARB for now due to soft BP.    4. Diabetes mellitus, uncontrolled with significant hyperglycemia, recent reported steroid taper.  5. Status post ICD, followed by Dr. Lovena Le. No recent device discharges.  6. CKD stage 4-5, recent creatinine 3.4 and now 1.59  7.  Paroxysmal atrial fibrillation with RVR now s/p emergent DCCV in setting of STEMI.  This patients CHA2DS2-VASc Score and unadjusted Ischemic Stroke Rate (% per year) is equal to 9.8 % stroke rate/year from a score of 6  Above score calculated as 1 point each if present [CHF, HTN, DM, Vascular=MI/PAD/Aortic Plaque, Age if 65-74, or Female] Above score calculated as 2 points each if present [Age > 75, or Stroke/TIA/TE]  8.  Hyperlipidemia - she was on Lipitor at home but this was stopped 01/2014 admission in setting of elevated LFTs in setting of dehydration and PNA.  Her LFTs had been fine on Lipitor in the past.  Will restart at 40mg  daily and recheck LFTs in 2 weeks  OK to transfer to tele bed  Sueanne Margarita, MD  06/14/2014  11:10 AM

## 2014-06-14 NOTE — Progress Notes (Signed)
Noted consult for insulin and education. Spoke with patient about diabetes and home regimen for diabetes control. Patient reports that she is followed by her PCP for diabetes management and currently she takes Januvia 100 mg daily as an outpatient for diabetes control. According to patient she checks her glucose once a day usually 2 hours after a meal and it is usually less than 180 mg/dl. Patient states that she was recently put on steroid taper and has been taking steroids for almost one month for tendonitis. Patient states that she she started on steroids, her glucose has been much higher than normal. Inquired about knowledge about A1C and patient reports that she knows what an A1C is and her PCP checks her A1C every 3 months.  Patient's A1C on 04/19/14 was 5.8% and current A1C results 13.7% on 06/10/14. Anticipate that elevated A1C is likely due to recent steroid use.  Patient states that she does not want to be placed on insulin as an outpatient. 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  Informed patient I would make a note to reflect her preference of not starting insulin as an outpatient. Encouraged patient to check her fasting glucose and check glucose again during the day and to keep a log of glucose readings she can take with her to follow up visits with her PCP to ensure diabetes is being controlled appropriately.  Patient verbalized understanding of information discussed and she states that she has no further questions at this time related to diabetes.   Anticipate elevated A1C is due to recent steroid use especially given substantial change in A1C from 5.8% to 13.7% in less than 2 months time.  MD, patient does not want to start insulin as an outpatient but is open to taking additional oral DM medication if needed. May want to consider resuming  Januvia as an inpatient to determine effect of glycemic control.  Thanks, Barnie Alderman, RN, MSN, CCRN, CDE Diabetes Coordinator Inpatient Diabetes Program [phone] (Team Pager) [phone] ([facility] office) [phone] [facility] office)

## 2014-06-14 NOTE — Progress Notes (Signed)
Pt walked with PT this am 500 ft. She declines walking now, stating she has a HA. Discussed ed with pt. She is watching her sodium at home and weighing daily. Also discussed carb counting. She sts she stays away from that stuff. Encouraged ex and gave gl for walking at home. Not interested in CRPII due to distance and work. Reviewed NTG. [id number] Yves Dill CES, ACSM 11:46 AM 06/14/2014

## 2014-06-14 NOTE — Progress Notes (Signed)
[facility]                                                                              Patient Demographics  Jo Lynn, is a 69 y.o. female, DOB - [birth year], GMW:[id number]  Admit date - 06/10/2014   Admitting Physician Nita Sells, MD  Outpatient Primary MD for the patient is Chevis Pretty, [place]  LOS - 4   Chief Complaint  Patient presents with  . Hyperglycemia       Brief HPI   The patient is a 69 year old female with ischemic cardiomyopathy, EF 20-25%, diabetes, stage IV chronic kidney disease presented with hyperglycemia. Patient was having right foot pain due to tendinitis and was started on oral prednisone. Patient reported generalized weakness at work and was found to have markedly elevated blood sugar in 500s. Patient presented to ED with hyperglycemia, not in DKA, BUN greater than 162, creatinine 4.17. Creatinine was 2.2 on 4/6. Patient was started on IV fluids and insulin. After arrival to her room, she started nausea and vomiting, abdominal/chest discomfort. EKG showed inverted T waves in the lateral leads. Over the next few days, CBGs became more stable, staying below 200. Creatinine was trending down. On the morning of 4/18, patient complained of severe chest pain. EKG showed ST segment elevations in the inferior lateral leads with reciprocal ST depressions in anterior leads. Code STEMI was called and patient underwent urgent cardiac catheterization. She was found to have 99% blockage in the distal SVG to circumflex artery graft, was successfully ballooned. Patient also had episode of atrial fibrillation and underwent successful DC cardioversion emergently due to hypotension.   Assessment & Plan    Principal Problem:   Diabetes mellitus type 2, uncontrolled: Likely worsened in the setting of prednisone - Hemoglobin A1c 13.7 CBGs better controlled, - With elevated hemoglobin A1c, will likely need insulin at home and close monitoring,  glycemic control. Will place nutrition consult,  diabetic coordinator consult      Active Problems:    hypotension with history of Essential hypertension - Currently borderline, holding Imdur, metoprolol    acute inferoposterior ST segment elevation MI, history of Cardiomyopathy, ischemic - EF ~20-25%; Large MI, existing occluded RCA & SVG-RCA; h/o CABG - Appreciated cardiology assistance, status post urgent cardiac catheterization and balloon angioplasty.  - Continue aspirin and Plavix long-term, no new stents placed in    new atrial fibrillation - Patient was emergently cardioverted due to hypotension, currently placed on amiodarone drip - Anticoagulation outpatient per cardiology, currently on aspirin and Plavix, heparin drip     acute on CKD (chronic kidney disease), stage IV -  creatinine function improving, 1.5 today, on gentle hydration    right ankle tendinitis - Avoid NSAIDs, currently holding prednisone due to significant hyperglycemia - Continue lidocaine cream, patient did well with physical therapy today   Code Status: full code  Family Communication: Discussed in detail with the patient, all imaging results, lab results explained to the patient    Disposition Plan: awaiting cardiology plan if patient needs to be in stepdown, hopefully DC next 24-48 hours   Time Spent in minutes   25 minutes  Procedures  Cardiac catheterization 4/18  IMPRESSIONS:   1. 99% in-stent restenosis in the distal SVG to circumflex artery graft which was the culprit for today's presentation. This was successfully treated with a 2.0 x 10 Angiosculpt scoring balloon inflated to high pressure. This was followed by a 3.5 x 10 Angiosculpt scoring balloon inflated to high pressure. 2. LVEDP 5 mmHg. 3. Successful DC cardioversion of atrial fibrillation to normal sinus rhythm. This was performed emergently due to hypotension.   Consults   cardiology  DVT Prophylaxis   heparin    Medications  Scheduled Meds: . allopurinol  50 mg Oral BID  . amiodarone  150 mg Intravenous Once  . aspirin EC  81 mg Oral Daily  . clopidogrel  75 mg Oral Daily  . docusate sodium  100 mg Oral BID  . fluticasone  2 spray Each Nare Daily  . folic acid  1 mg Oral Daily  . insulin aspart  0-15 Units Subcutaneous TID WC  . insulin aspart  0-5 Units Subcutaneous QHS  . insulin glargine  10 Units Subcutaneous Daily  . pantoprazole  40 mg Oral Daily  . sodium chloride  10-40 mL Intracatheter Q12H  . sodium chloride  3 mL Intravenous Q12H  . thiamine  100 mg Oral Daily   Continuous Infusions: . sodium chloride 75 mL/hr at 06/14/14 0900  . sodium chloride Stopped (06/13/14 1500)  . amiodarone 30 mg/hr (06/14/14 0021)  . heparin 900 Units/hr (06/14/14 0737)   PRN Meds:.acetaminophen, alum & mag hydroxide-simeth, lidocaine, nitroGLYCERIN, ondansetron **OR** ondansetron (ZOFRAN) IV, sodium chloride   Antibiotics   Anti-infectives    None        Subjective:   Jo Lynn was seen and examined today.  Patient denies dizziness, chest pain, shortness of breath, abdominal pain, N/V/D/C, new weakness, numbess, tingling. No acute events overnight.  some bleeding on the PICC line site.     Objective:   Blood pressure 108/51, pulse 114, temperature 98.2 F (36.8 C), temperature source Oral, resp. rate 17, height 5\' 7"  (1.702 m), weight 79.7 kg (175 lb 11.3 oz), SpO2 100 %.  Wt Readings from Last 3 Encounters:  06/14/14 79.7 kg (175 lb 11.3 oz)  05/11/14 78.019 kg (172 lb)  04/28/14 78.472 kg (173 lb)     Intake/Output Summary (Last 24 hours) at 06/14/14 1012 Last data filed at 06/14/14 0900  Gross per 24 hour  Intake 3210.1 ml  Output   1000 ml  Net 2210.1 ml    Exam  General: Alert and oriented x 3, NAD  HEENT:  PERRLA, EOMI, Anicteic Sclera, mucous membranes moist.   Neck: Supple, no JVD, no masses  CVS: S1 S2 auscultated, no rubs, 2/6 SEM  Respiratory:  Clear to auscultation bilaterally, no wheezing, rales or rhonchi  Abdomen: Soft, nontender, nondistended, + bowel sounds  Ext: no cyanosis clubbing or edema  Neuro: AAOx3, Cr N's II- XII. Strength 5/5 upper and lower extremities bilaterally  Skin: No rashes  Psych: Normal affect and demeanor, alert and oriented x3    Data Review   Micro Results Recent Results (from the past 240 hour(s))  Urine culture     Status: None   Collection Time: 06/10/14  2:57 PM  Result Value Ref Range Status   Specimen Description URINE, CLEAN CATCH  Final   Special Requests NONE  Final   Colony Count   Final    2,000 COLONIES/ML Performed at [employer] [facility]  Final    INSIGNIFICANT GROWTH Performed at [facility]    Report Status 06/11/2014 FINAL  Final  MRSA PCR Screening     Status: None   Collection Time: 06/13/14  9:02 AM  Result Value Ref Range Status   MRSA by PCR NEGATIVE NEGATIVE Final    Comment:        The GeneXpert MRSA Assay (FDA approved for NASAL specimens only), is one component of a comprehensive MRSA colonization surveillance program. It is not intended to diagnose MRSA infection nor to guide or monitor treatment for MRSA infections.     Radiology Reports Dg Chest Port 1 View  06/13/2014   CLINICAL DATA:  Status post PICC line placement.  EXAM: PORTABLE CHEST - 1 VIEW  COMPARISON:  April 18, 2014.  FINDINGS: Stable cardiomediastinal silhouette. Status post coronary artery bypass graft. Left-sided pacemaker is unchanged in position. No pneumothorax or pleural effusion is noted. No acute pulmonary disease is noted. There has been interval placement of right-sided PICC line with distal tip overlying expected position of SVC.  IMPRESSION: Interval placement of right-sided PICC line with distal tip overlying expected position of SVC. No acute cardiopulmonary abnormality seen.   Electronically Signed   By: Marijo Conception, M.D.   On: 06/13/2014 15:17     CBC  Recent Labs Lab 06/10/14 1433  06/10/14 1615 06/11/14 0434 06/13/14 0349 06/13/14 0925 06/14/14 0355  WBC 13.8*  --   --  11.6* 7.5 9.9 8.9  HGB 12.8  < > 12.6 11.8* 10.9* 10.9* 10.7*  HCT 37.7  < > 37.0 35.2* 33.3* 32.5* 31.9*  PLT 150  --   --  159 126* 121* 134*  MCV 86.1  --   --  86.3 87.4 86.2 87.4  MCH 29.2  --   --  28.9 28.6 28.9 29.3  MCHC 34.0  --   --  33.5 32.7 33.5 33.5  RDW 15.6*  --   --  15.5 16.0* 15.9* 16.0*  LYMPHSABS 0.9  --   --   --   --   --   --   MONOABS 0.5  --   --   --   --   --   --   EOSABS 0.0  --   --   --   --   --   --   BASOSABS 0.0  --   --   --   --   --   --   < > = values in this interval not displayed.  Chemistries   Recent Labs Lab 06/10/14 1433  06/11/14 0434 06/12/14 1010 06/13/14 0349 06/13/14 0925 06/14/14 0355  NA 128*  < > 134* 138 141 141 141  K >7.5*  < > 5.0 4.5 4.5 4.1 4.0  CL 93*  < > 99 106 110 109 108  CO2 19  < > 20 21 19  17* 23  GLUCOSE 481*  < > 315* 173* 95 178* 114*  BUN 162*  < > 133* 105* 78* 69* 44*  CREATININE 4.17*  < > 3.49* 2.50* 1.99* 1.85* 1.59*  CALCIUM 9.4  < > 9.3 8.7 8.7 8.8 9.0  AST 30  --  23  --   --   --   --   ALT 22  --  17  --   --   --   --   ALKPHOS 80  --  73  --   --   --   --  BILITOT 1.9*  --  0.8  --   --   --   --   < > = values in this interval not displayed. ------------------------------------------------------------------------------------------------------------------ estimated creatinine clearance is 35.8 mL/min (by C-G formula based on Cr of 1.59). ------------------------------------------------------------------------------------------------------------------ No results for input(s): HGBA1C in the last 72 hours. ------------------------------------------------------------------------------------------------------------------ No results for input(s): CHOL, HDL, LDLCALC, TRIG, CHOLHDL, LDLDIRECT in the last 72  hours. ------------------------------------------------------------------------------------------------------------------ No results for input(s): TSH, T4TOTAL, T3FREE, THYROIDAB in the last 72 hours.  Invalid input(s): FREET3 ------------------------------------------------------------------------------------------------------------------ No results for input(s): VITAMINB12, FOLATE, FERRITIN, TIBC, IRON, RETICCTPCT in the last 72 hours.  Coagulation profile No results for input(s): INR, PROTIME in the last 168 hours.  No results for input(s): DDIMER in the last 72 hours.  Cardiac Enzymes  Recent Labs Lab 06/11/14 1310 06/11/14 2300 06/13/14 0925  TROPONINI 0.07* 0.08* 0.06*   ------------------------------------------------------------------------------------------------------------------ Invalid input(s): POCBNP   Recent Labs  06/12/14 1621 06/12/14 2100 06/13/14 0549 06/13/14 1224 06/13/14 1718 06/13/14 2149  GLUCAP 168* 145* 103* 165* 190* 153*     Arloa Prak M.D. [facility] 06/14/2014, 10:12 AM  Pager: [phone]   Between 7am to 7pm - call Pager - [phone]  After 7pm go to [email or web address] - password [email or web address]  Call night coverage person covering after 7pm

## 2014-06-14 NOTE — Progress Notes (Signed)
Physical Therapy Treatment/ Discharge Patient Details Name: Jo Lynn MRN: [id number] DOB: 09/30/1945 Today's Date: 06/14/2014    History of Present Illness 69 yo female with onset of prednisone based tx for ankle pain with BS elevattion and medical complications. STEMI with cath 4/18, Afib    PT Comments    Pt very pleasant, still works full time and aside from work enjoys church activities. Pt with no pain, moving well and able to ambulate unit and complete stairs without cues or assist. Pt at baseline function without further need at this time, pt aware and agreeable. Will sign off with recommendation for daily ambulation with nursing supervision.   Follow Up Recommendations  No PT follow up     Equipment Recommendations  None recommended by PT    Recommendations for Other Services       Precautions / Restrictions Precautions Precautions: None    Mobility  Bed Mobility Overal bed mobility: Modified Independent                Transfers Overall transfer level: Modified independent                  Ambulation/Gait Ambulation/Gait assistance: Independent Ambulation Distance (Feet): 500 Feet Assistive device: None Gait Pattern/deviations: WFL(Within Functional Limits)   Gait velocity interpretation: at or above normal speed for age/gender General Gait Details: good speed, balance and no deficits or pain with right ankle   Stairs Stairs: Yes Stairs assistance: Modified independent (Device/Increase time) Stair Management: One rail Right;Alternating pattern;Forwards Number of Stairs: 5    Wheelchair Mobility    Modified Rankin (Stroke Patients Only)       Balance Overall balance assessment: No apparent balance deficits (not formally assessed)                                  Cognition Arousal/Alertness: Awake/alert Behavior During Therapy: WFL for tasks assessed/performed Overall Cognitive Status: Within Functional Limits  for tasks assessed                      Exercises      General Comments        Pertinent Vitals/Pain Pain Assessment: No/denies pain  HR 114-130    Home Living                      Prior Function            PT Goals (current goals can now be found in the care plan section) Progress towards PT goals: Goals met/education completed, patient discharged from PT    Frequency       PT Plan Discharge plan needs to be updated    Co-evaluation             End of Session   Activity Tolerance: Patient tolerated treatment well Patient left: in chair;with call bell/phone within reach     Time: 0856-0906 PT Time Calculation (min) (ACUTE ONLY): 10 min  Charges:  $Gait Training: 8-22 mins                    G Codes:      Tabor, Maija Beth 06/14/2014, 9:11 AM Maija Tabor Prater, PT [phone]   

## 2014-06-14 NOTE — Discharge Instructions (Signed)
Information on my medicine - ELIQUIS (apixaban)  This medication education was reviewed with me or my healthcare representative as part of my discharge preparation.  The pharmacist that spoke with me during my hospital stay was:  Romona Curls, [facility]  Why was Eliquis prescribed for you? Eliquis was prescribed for you to reduce the risk of a blood clot forming that can cause a stroke if you have a medical condition called atrial fibrillation (a type of irregular heartbeat).  What do You need to know about Eliquis ? Take your Eliquis TWICE DAILY - one tablet in the morning and one tablet in the evening with or without food. If you have difficulty swallowing the tablet whole please discuss with your pharmacist how to take the medication safely.  Take Eliquis exactly as prescribed by your doctor and DO NOT stop taking Eliquis without talking to the doctor who prescribed the medication.  Stopping may increase your risk of developing a stroke.  Refill your prescription before you run out.  After discharge, you should have regular check-up appointments with your healthcare provider that is prescribing your Eliquis.  In the future your dose may need to be changed if your kidney function or weight changes by a significant amount or as you get older.  What do you do if you miss a dose? If you miss a dose, take it as soon as you remember on the same day and resume taking twice daily.  Do not take more than one dose of ELIQUIS at the same time to make up a missed dose.  Important Safety Information A possible side effect of Eliquis is bleeding. You should call your healthcare provider right away if you experience any of the following:Information on my medicine - ELIQUIS (apixaban)  This medication education was reviewed with me or my healthcare representative as part of my discharge preparation.  The pharmacist that spoke with me during my hospital stay was:  Romona Curls, [facility]  Why was Eliquis  prescribed for you? Eliquis was prescribed for you to reduce the risk of a blood clot forming that can cause a stroke if you have a medical condition called atrial fibrillation (a type of irregular heartbeat).  What do You need to know about Eliquis ? Take your Eliquis TWICE DAILY - one tablet in the morning and one tablet in the evening with or without food. If you have difficulty swallowing the tablet whole please discuss with your pharmacist how to take the medication safely.  Take Eliquis exactly as prescribed by your doctor and DO NOT stop taking Eliquis without talking to the doctor who prescribed the medication.  Stopping may increase your risk of developing a stroke.  Refill your prescription before you run out.  After discharge, you should have regular check-up appointments with your healthcare provider that is prescribing your Eliquis.  In the future your dose may need to be changed if your kidney function or weight changes by a significant amount or as you get older.  What do you do if you miss a dose? If you miss a dose, take it as soon as you remember on the same day and resume taking twice daily.  Do not take more than one dose of ELIQUIS at the same time to make up a missed dose.  Important Safety Information A possible side effect of Eliquis is bleeding. You should call your healthcare provider right away if you experience any of the following: ? Bleeding from an injury or  your nose that does not stop. ? Unusual colored urine (red or dark brown) or unusual colored stools (red or black). ? Unusual bruising for unknown reasons. ? A serious fall or if you hit your head (even if there is no bleeding).  Some medicines may interact with Eliquis and might increase your risk of bleeding or clotting while on Eliquis. To help avoid this, consult your healthcare provider or pharmacist prior to using any new prescription or non-prescription medications, including herbals, vitamins,  non-steroidal anti-inflammatory drugs (NSAIDs) and supplements.  This website has more information on Eliquis (apixaban): http://www.eliquis.com/eliquis/home ? Bleeding from an injury or your nose that does not stop. ? Unusual colored urine (red or dark brown) or unusual colored stools (red or black). ? Unusual bruising for unknown reasons. ? A serious fall or if you hit your head (even if there is no bleeding).  Some medicines may interact with Eliquis and might increase your risk of bleeding or clotting while on Eliquis. To help avoid this, consult your healthcare provider or pharmacist prior to using any new prescription or non-prescription medications, including herbals, vitamins, non-steroidal anti-inflammatory drugs (NSAIDs) and supplements.  This website has more information on Eliquis (apixaban): http://www.eliquis.com/eliquis/home

## 2014-06-15 LAB — CBC
HCT: 29.7 % — ABNORMAL LOW (ref 36.0–46.0)
HEMOGLOBIN: 9.8 g/dL — AB (ref 12.0–15.0)
MCH: 28.8 pg (ref 26.0–34.0)
MCHC: 33 g/dL (ref 30.0–36.0)
MCV: 87.4 fL (ref 78.0–100.0)
Platelets: 127 10*3/uL — ABNORMAL LOW (ref 150–400)
RBC: 3.4 MIL/uL — ABNORMAL LOW (ref 3.87–5.11)
RDW: 16.2 % — ABNORMAL HIGH (ref 11.5–15.5)
WBC: 8.2 10*3/uL (ref 4.0–10.5)

## 2014-06-15 LAB — BASIC METABOLIC PANEL
ANION GAP: 7 (ref 5–15)
BUN: 29 mg/dL — ABNORMAL HIGH (ref 6–23)
CHLORIDE: 105 mmol/L (ref 96–112)
CO2: 25 mmol/L (ref 19–32)
CREATININE: 1.53 mg/dL — AB (ref 0.50–1.10)
Calcium: 8.6 mg/dL (ref 8.4–10.5)
GFR calc non Af Amer: 33 mL/min — ABNORMAL LOW (ref >90)
GFR, EST AFRICAN AMERICAN: 39 mL/min — AB (ref >90)
Glucose, Bld: 181 mg/dL — ABNORMAL HIGH (ref 70–99)
Potassium: 4.4 mmol/L (ref 3.5–5.1)
SODIUM: 137 mmol/L (ref 135–145)

## 2014-06-15 LAB — GLUCOSE, CAPILLARY
GLUCOSE-CAPILLARY: 153 mg/dL — AB (ref 70–99)
GLUCOSE-CAPILLARY: 160 mg/dL — AB (ref 70–99)
Glucose-Capillary: 130 mg/dL — ABNORMAL HIGH (ref 70–99)
Glucose-Capillary: 129 mg/dL — ABNORMAL HIGH (ref 70–99)

## 2014-06-15 MED ORDER — GLIMEPIRIDE 2 MG PO TABS
2.0000 mg | ORAL_TABLET | Freq: Every day | ORAL | Status: DC
  Administered 2014-06-15 – 2014-06-16 (×2): 2 mg via ORAL
  Filled 2014-06-15 (×3): qty 1

## 2014-06-15 NOTE — Plan of Care (Signed)
Problem: Food- and Nutrition-Related Knowledge Deficit (NB-1.1) Goal: Nutrition education Formal process to instruct or train a patient/client in a skill or to impart knowledge to help patients/clients voluntarily manage or modify food choices and eating behavior to maintain or improve health. Outcome: Adequate for Discharge Brief Nutrition Follow-Up Note   RD consulted for nutrition education regarding diabetes.     Lab Results  Component Value Date    HGBA1C 13.7* 06/10/2014   Followed up with pt for education needs today. See progress note on 06/14/14 for further details.   Pt reports she reviewed "Carbohydrate Counting for People with Diabetes" handout from the Academy of [employer] Dietetics that RD provided yesterday. She was able to identify carbohydrate containing foods. She reports vigilantly follows a low sodium diet at home and eats very small amounts of breads and pastas at home to control her blood sugar. She reveals that she controlled her blood sugar very well at home prior to initiation of steroids. Used teach back method to review previous DM diet knowledge.   Pt expressed appreciation for visit, but denied further questions or education needs at this time.   Expect good compliance.  Body mass index is 27.82 kg/(m^2). Pt meets criteria for overweight based on current BMI.  Current diet order is Heart Healthy/ Carb Modified, patient is consuming approximately 75% of meals at this time. Labs and medications reviewed. No further nutrition interventions warranted at this time. RD contact information provided. If additional nutrition issues arise, please re-consult RD.  Jo Lynn A. Jimmye Norman, RD, LDN, CDE Pager: [phone] After hours Pager: [phone]

## 2014-06-15 NOTE — Care Management Note (Signed)
    Page 1 of 1   06/15/2014     10:43:58 AM CARE MANAGEMENT NOTE 06/15/2014  Patient:  Jo Lynn, Jo Lynn   Account Number:  [id number]  Date Initiated:  06/15/2014  Documentation initiated by:  Elissa Hefty  Subjective/Objective Assessment:   adm w dm     Action/Plan:   lives w fam, pcp dr Ronnald Collum   Anticipated DC Date:     Anticipated DC Plan:  [place]  CM consult  Medication Assistance      Choice offered to / List presented to:             Status of service:   Medicare Important Message given?  YES (If response is "NO", the following Medicare IM given date fields will be blank) Date Medicare IM given:  06/15/2014 Medicare IM given by:  Elissa Hefty Date Additional Medicare IM given:   Additional Medicare IM given by:    Discharge Disposition:    Per UR Regulation:  Reviewed for med. necessity/level of care/duration of stay  If discussed at [place] of Stay Meetings, dates discussed:   06/16/2014    Comments:  4/20 1038a debbie Rayshell Goecke rn,bsn gave pt 30day free eliquis card and 10.00 per month copay assist card. pt still works and has [employer] ins.

## 2014-06-15 NOTE — Progress Notes (Signed)
CARDIAC REHAB PHASE I   PRE:  Rate/Rhythm: 98 SR    BP: sitting 119/57    SaO2: 99 Ra  MODE:  Ambulation: 390 ft   POST:  Rate/Rhythm: 133 ST with occ PVC    BP: sitting 121/64     SaO2: 100 RA  Pt walked without c/o however upon return to room HR up to 133 ST. HR decreased with rest down to 90s then had short bout of bigeminy in 80s. Pt asymptomatic, denies c/o. Does not seem interested in communicating much and sts "I'll be all right when I get home." Pt later to BR and again HR up to 129 ST. Notified RN. [id number]  Jo Lynn CES, ACSM 06/15/2014 2:21 PM

## 2014-06-15 NOTE — Progress Notes (Signed)
SUBJECTIVE:  No complaints  OBJECTIVE:   Vitals:   Filed Vitals:   06/15/14 0000 06/15/14 0400 06/15/14 0412 06/15/14 0800  BP: 109/54 130/82  94/53  Pulse: 90 79    Temp: 98.6 F (37 C) 98.4 F (36.9 C)  98.5 F (36.9 C)  TempSrc: Oral Oral  Oral  Resp:      Height:      Weight:   177 lb 11.1 oz (80.6 kg)   SpO2: 98% 99%  98%   I&O's:   Intake/Output Summary (Last 24 hours) at 06/15/14 1014 Last data filed at 06/15/14 0900  Gross per 24 hour  Intake    240 ml  Output      0 ml  Net    240 ml   TELEMETRY: Reviewed telemetry pt in NSR:     PHYSICAL EXAM General: Well developed, well nourished, in no acute distress Head: Eyes PERRLA, No xanthomas.   Normal cephalic and atramatic  Lungs:   Clear bilaterally to auscultation and percussion. Heart:   HRRR S1 S2 Pulses are 2+ & equal. Abdomen: Bowel sounds are positive, abdomen soft and non-tender without masses Extremities:   No clubbing, cyanosis or edema.  DP +1 Neuro: Alert and oriented X 3. Psych:  Good affect, responds appropriately   LABS: Basic Metabolic Panel:  Recent Labs  06/13/14 0925 06/14/14 0355  NA 141 141  K 4.1 4.0  CL 109 108  CO2 17* 23  GLUCOSE 178* 114*  BUN 69* 44*  CREATININE 1.85* 1.59*  CALCIUM 8.8 9.0   Liver Function Tests: No results for input(s): AST, ALT, ALKPHOS, BILITOT, PROT, ALBUMIN in the last 72 hours. No results for input(s): LIPASE, AMYLASE in the last 72 hours. CBC:  Recent Labs  06/14/14 0355 06/15/14 0400  WBC 8.9 8.2  HGB 10.7* 9.8*  HCT 31.9* 29.7*  MCV 87.4 87.4  PLT 134* 127*   Cardiac Enzymes:  Recent Labs  06/13/14 0925  TROPONINI 0.06*   BNP: Invalid input(s): POCBNP D-Dimer: No results for input(s): DDIMER in the last 72 hours. Hemoglobin A1C: No results for input(s): HGBA1C in the last 72 hours. Fasting Lipid Panel: No results for input(s): CHOL, HDL, LDLCALC, TRIG, CHOLHDL, LDLDIRECT in the last 72 hours. Thyroid Function  Tests: No results for input(s): TSH, T4TOTAL, T3FREE, THYROIDAB in the last 72 hours.  Invalid input(s): FREET3 Anemia Panel: No results for input(s): VITAMINB12, FOLATE, FERRITIN, TIBC, IRON, RETICCTPCT in the last 72 hours. Coag Panel:   Lab Results  Component Value Date   INR 1.36 04/18/2014   INR 1.16 09/10/2013   INR 1.33 09/09/2013    RADIOLOGY: Dg Chest Port 1 View  06/13/2014   CLINICAL DATA:  Status post PICC line placement.  EXAM: PORTABLE CHEST - 1 VIEW  COMPARISON:  April 18, 2014.  FINDINGS: Stable cardiomediastinal silhouette. Status post coronary artery bypass graft. Left-sided pacemaker is unchanged in position. No pneumothorax or pleural effusion is noted. No acute pulmonary disease is noted. There has been interval placement of right-sided PICC line with distal tip overlying expected position of SVC.  IMPRESSION: Interval placement of right-sided PICC line with distal tip overlying expected position of SVC. No acute cardiopulmonary abnormality seen.   Electronically Signed   By: Marijo Conception, M.D.   On: 06/13/2014 15:17   Impression  1. S/P acute inferoposterior STEMI - had been having episodes of chest pain, recent "indigestion" symptoms, possibly having some angina in the setting of hypoglycemia and  known ischemic heart disease. Troponin I levels was minimally increased on admit and overall less specific in the setting of chronic kidney disease. She had recurrent chest pain with ST segment abnormalities in the anterolateral leads suggestive of ischemia in setting of rapid atrial fibrillation. Taken to cath lab for emergent cath and found to have 99% in-stent restenosis of the distal SVG to LCX graft and underwent PTCA with no stent placement. She had emergent DCCV as well. Continue Plavix/statin/Coreg.   2. Multivessel CAD status post CABG with intervention in July 2015 in the setting of STEMI, BMS to the SVG to obtuse marginal system. She is currently on dual  antiplatelet therapy. Now s/p angioplasty with no stent to SVG to LCX. Will stop ASA and continue Plavix since she is being started on Eliquis for PAF. This was discussed with Dr. Irish Lack. Her renal function will need to be monitored closely. Her Creatinine is 1.5 today but has been as high as 3.4 in the past. Will need a repeat BMET next Monday to follow.  3. Ischemic cardiomyopathy with LVEF 20-25%. Contiue low dose coreg. Hold on restarting ARB for now due to soft BP.   4. Diabetes mellitus, uncontrolled with significant hyperglycemia, recent reported steroid taper.  5. Status post ICD, followed by Dr. Lovena Le. No recent device discharges.  6. CKD stage 4-5, recent creatinine 3.4 and now 1.59.  BMET pending this am  7. Paroxysmal atrial fibrillation with RVR now s/p emergent DCCV to NSR in setting of STEMI. This patients CHA2DS2-VASc Score and unadjusted Ischemic Stroke Rate (% per year) is equal to 9.8 % stroke rate/year from a score of 6  Above score calculated as 1 point each if present [CHF, HTN, DM, Vascular=MI/PAD/Aortic Plaque, Age if 65-74, or Female] Above score calculated as 2 points each if present [Age > 75, or Stroke/TIA/TE]  8. Hyperlipidemia - she was on Lipitor at home but this was stopped 01/2014 admission in setting of elevated LFTs in setting of dehydration and PNA. Her LFTs had been fine on Lipitor in the past. Restarted at 40mg  daily will need to recheck LFTs in 2 weeks  Stable from cardiac standpoint to transfer to tele bed or discharge home.  WIll need close followup with Dr. Mickel Baas, MD  06/15/2014  10:14 AM

## 2014-06-15 NOTE — Progress Notes (Signed)
[facility]                                                                              Patient Demographics  Jo Lynn, is a 69 y.o. female, DOB - 30-Apr-1945, KKX:[id number]  Admit date - 06/10/2014   Admitting Physician Nita Sells, MD  Outpatient Primary MD for the patient is Chevis Pretty, [place]  LOS - 5   Chief Complaint  Patient presents with  . Hyperglycemia       Brief HPI   The patient is a 69 year old female with ischemic cardiomyopathy, EF 20-25%, diabetes, stage IV chronic kidney disease presented with hyperglycemia. Patient was having right foot pain due to tendinitis and was started on oral prednisone. Patient reported generalized weakness at work and was found to have markedly elevated blood sugar in 500s. Patient presented to ED with hyperglycemia, not in DKA, BUN greater than 162, creatinine 4.17. Creatinine was 2.2 on 4/6. Patient was started on IV fluids and insulin. After arrival to her room, she started nausea and vomiting, abdominal/chest discomfort. EKG showed inverted T waves in the lateral leads. Over the next few days, CBGs became more stable, staying below 200. Creatinine was trending down. On the morning of 4/18, patient complained of severe chest pain. EKG showed ST segment elevations in the inferior lateral leads with reciprocal ST depressions in anterior leads. Code STEMI was called and patient underwent urgent cardiac catheterization. She was found to have 99% blockage in the distal SVG to circumflex artery graft, was successfully ballooned. Patient also had episode of atrial fibrillation and underwent successful DC cardioversion emergently due to hypotension.   Assessment & Plan    Principal Problem:   Diabetes mellitus type 2, uncontrolled: Likely worsened in the setting of prednisone - Hemoglobin A1c 13.7 CBGs better controlled as patient is off of prednisone - Discussed in detail with the patient, she does not want  to be on insulin outpatient. Restarted Januvia, also added Amaryl. Appreciate nutrition recommendations to the patient    Active Problems:    hypotension with history of Essential hypertension - Currently borderline but stable, holding Imdur. Coreg restarted   acute inferoposterior ST segment elevation MI, history of Cardiomyopathy, ischemic - EF ~20-25%; Large MI, existing occluded RCA & SVG-RCA; h/o CABG - Appreciated cardiology assistance, status post urgent cardiac catheterization and balloon angioplasty.  - Continue Plavix per cardiology, statin, Coreg restarted    new atrial fibrillation - Patient was emergently cardioverted due to hypotension, currently placed on amiodarone drip - Patient started on eliquis by cardiology service.     acute on CKD (chronic kidney disease), stage IV -  creatinine function improving, 1.5    right ankle tendinitis - Avoid NSAIDs, currently holding prednisone due to significant hyperglycemia - Continue lidocaine cream, patient did well with physical therapy   Code Status: full code  Family Communication: Discussed in detail with the patient, all imaging results, lab results explained to the patient    Disposition Plan: Transfer to telemetry monitored floor, DC in a.m.  Time Spent in minutes   25 minutes  Procedures  Cardiac catheterization 4/18  IMPRESSIONS:   1.  99% in-stent restenosis in the distal SVG to circumflex artery graft which was the culprit for today's presentation. This was successfully treated with a 2.0 x 10 Angiosculpt scoring balloon inflated to high pressure. This was followed by a 3.5 x 10 Angiosculpt scoring balloon inflated to high pressure. 2. LVEDP 5 mmHg. 3. Successful DC cardioversion of atrial fibrillation to normal sinus rhythm. This was performed emergently due to hypotension.   Consults   cardiology  DVT Prophylaxis   eliquis  Medications  Scheduled Meds: . allopurinol  50 mg Oral BID  .  apixaban  5 mg Oral BID  . atorvastatin  40 mg Oral q1800  . carvedilol  3.125 mg Oral BID WC  . clopidogrel  75 mg Oral Daily  . docusate sodium  100 mg Oral BID  . fluticasone  2 spray Each Nare Daily  . folic acid  1 mg Oral Daily  . glimepiride  2 mg Oral Q breakfast  . insulin aspart  0-15 Units Subcutaneous TID WC  . insulin aspart  0-5 Units Subcutaneous QHS  . linagliptin  5 mg Oral Daily  . pantoprazole  40 mg Oral Daily  . sodium chloride  10-40 mL Intracatheter Q12H  . sodium chloride  3 mL Intravenous Q12H  . thiamine  100 mg Oral Daily   Continuous Infusions: . sodium chloride Stopped (06/13/14 1500)   PRN Meds:.acetaminophen, alum & mag hydroxide-simeth, lidocaine, nitroGLYCERIN, ondansetron **OR** ondansetron (ZOFRAN) IV, sodium chloride   Antibiotics   Anti-infectives    None        Subjective:   Cleatus Goodin was seen and examined today.  Patient denies dizziness, chest pain, shortness of breath, abdominal pain, N/V/D/C, new weakness, numbess, tingling. No acute events overnight. No fevers, patient noticed to have some tachycardia with activity, nonsustained and low 100s  Objective:   Blood pressure 105/62, pulse 79, temperature 98.2 F (36.8 C), temperature source Oral, resp. rate 18, height 5\' 7"  (1.702 m), weight 80.6 kg (177 lb 11.1 oz), SpO2 98 %.  Wt Readings from Last 3 Encounters:  06/15/14 80.6 kg (177 lb 11.1 oz)  05/11/14 78.019 kg (172 lb)  04/28/14 78.472 kg (173 lb)     Intake/Output Summary (Last 24 hours) at 06/15/14 1224 Last data filed at 06/15/14 0900  Gross per 24 hour  Intake    240 ml  Output      0 ml  Net    240 ml    Exam  General: Alert and oriented x 3, NAD  HEENT:  PERRLA, EOMI, Anicteic Sclera, mucous membranes moist.   Neck: Supple, no JVD, no masses  CVS: S1 S2 auscultated, no rubs, 2/6 SEM  Respiratory: CTAB  Abdomen: Soft, NT, ND, NBS  Ext: no cyanosis clubbing or edema bilaterally  Neuro: AAOx3,  Cr N's II- XII. Strength 5/5 upper and lower extremities bilaterally  Skin: No rashes  Psych: Normal affect and demeanor, alert and oriented x3    Data Review   Micro Results Recent Results (from the past 240 hour(s))  Urine culture     Status: None   Collection Time: 06/10/14  2:57 PM  Result Value Ref Range Status   Specimen Description URINE, CLEAN CATCH  Final   Special Requests NONE  Final   Colony Count   Final    2,000 COLONIES/ML Performed at [facility]    Culture   Final    INSIGNIFICANT GROWTH Performed at [facility]  Status 06/11/2014 FINAL  Final  MRSA PCR Screening     Status: None   Collection Time: 06/13/14  9:02 AM  Result Value Ref Range Status   MRSA by PCR NEGATIVE NEGATIVE Final    Comment:        The GeneXpert MRSA Assay (FDA approved for NASAL specimens only), is one component of a comprehensive MRSA colonization surveillance program. It is not intended to diagnose MRSA infection nor to guide or monitor treatment for MRSA infections.     Radiology Reports Dg Chest Port 1 View  06/13/2014   CLINICAL DATA:  Status post PICC line placement.  EXAM: PORTABLE CHEST - 1 VIEW  COMPARISON:  April 18, 2014.  FINDINGS: Stable cardiomediastinal silhouette. Status post coronary artery bypass graft. Left-sided pacemaker is unchanged in position. No pneumothorax or pleural effusion is noted. No acute pulmonary disease is noted. There has been interval placement of right-sided PICC line with distal tip overlying expected position of SVC.  IMPRESSION: Interval placement of right-sided PICC line with distal tip overlying expected position of SVC. No acute cardiopulmonary abnormality seen.   Electronically Signed   By: Marijo Conception, M.D.   On: 06/13/2014 15:17    CBC  Recent Labs Lab 06/10/14 1433  06/11/14 0434 06/13/14 0349 06/13/14 0925 06/14/14 0355 06/15/14 0400  WBC 13.8*  --  11.6* 7.5 9.9 8.9 8.2  HGB 12.8  < >  11.8* 10.9* 10.9* 10.7* 9.8*  HCT 37.7  < > 35.2* 33.3* 32.5* 31.9* 29.7*  PLT 150  --  159 126* 121* 134* 127*  MCV 86.1  --  86.3 87.4 86.2 87.4 87.4  MCH 29.2  --  28.9 28.6 28.9 29.3 28.8  MCHC 34.0  --  33.5 32.7 33.5 33.5 33.0  RDW 15.6*  --  15.5 16.0* 15.9* 16.0* 16.2*  LYMPHSABS 0.9  --   --   --   --   --   --   MONOABS 0.5  --   --   --   --   --   --   EOSABS 0.0  --   --   --   --   --   --   BASOSABS 0.0  --   --   --   --   --   --   < > = values in this interval not displayed.  Chemistries   Recent Labs Lab 06/10/14 1433  06/11/14 0434 06/12/14 1010 06/13/14 0349 06/13/14 0925 06/14/14 0355 06/15/14 1030  NA 128*  < > 134* 138 141 141 141 137  K >7.5*  < > 5.0 4.5 4.5 4.1 4.0 4.4  CL 93*  < > 99 106 110 109 108 105  CO2 19  < > 20 21 19  17* 23 25  GLUCOSE 481*  < > 315* 173* 95 178* 114* 181*  BUN 162*  < > 133* 105* 78* 69* 44* 29*  CREATININE 4.17*  < > 3.49* 2.50* 1.99* 1.85* 1.59* 1.53*  CALCIUM 9.4  < > 9.3 8.7 8.7 8.8 9.0 8.6  AST 30  --  23  --   --   --   --   --   ALT 22  --  17  --   --   --   --   --   ALKPHOS 80  --  73  --   --   --   --   --   BILITOT 1.9*  --  0.8  --   --   --   --   --   < > = values in this interval not displayed. ------------------------------------------------------------------------------------------------------------------ estimated creatinine clearance is 37.4 mL/min (by C-G formula based on Cr of 1.53). ------------------------------------------------------------------------------------------------------------------ No results for input(s): HGBA1C in the last 72 hours. ------------------------------------------------------------------------------------------------------------------ No results for input(s): CHOL, HDL, LDLCALC, TRIG, CHOLHDL, LDLDIRECT in the last 72 hours. ------------------------------------------------------------------------------------------------------------------ No results for input(s): TSH,  T4TOTAL, T3FREE, THYROIDAB in the last 72 hours.  Invalid input(s): FREET3 ------------------------------------------------------------------------------------------------------------------ No results for input(s): VITAMINB12, FOLATE, FERRITIN, TIBC, IRON, RETICCTPCT in the last 72 hours.  Coagulation profile No results for input(s): INR, PROTIME in the last 168 hours.  No results for input(s): DDIMER in the last 72 hours.  Cardiac Enzymes  Recent Labs Lab 06/11/14 1310 06/11/14 2300 06/13/14 0925  TROPONINI 0.07* 0.08* 0.06*   ------------------------------------------------------------------------------------------------------------------ Invalid input(s): POCBNP   Recent Labs  06/14/14 0722 06/14/14 1118 06/14/14 1614 06/14/14 2228 06/15/14 0745 06/15/14 1129  GLUCAP 108* 231* 130* 290* 130* 153*     Modesto Ganoe M.D. [facility] 06/15/2014, 12:24 PM  Pager: [phone]   Between 7am to 7pm - call Pager - [phone]  After 7pm go to [email or web address] - password [email or web address]  Call night coverage person covering after 7pm

## 2014-06-16 LAB — CBC
HCT: 28 % — ABNORMAL LOW (ref 36.0–46.0)
Hemoglobin: 9.3 g/dL — ABNORMAL LOW (ref 12.0–15.0)
MCH: 29 pg (ref 26.0–34.0)
MCHC: 33.2 g/dL (ref 30.0–36.0)
MCV: 87.2 fL (ref 78.0–100.0)
PLATELETS: 125 10*3/uL — AB (ref 150–400)
RBC: 3.21 MIL/uL — ABNORMAL LOW (ref 3.87–5.11)
RDW: 16 % — ABNORMAL HIGH (ref 11.5–15.5)
WBC: 7.3 10*3/uL (ref 4.0–10.5)

## 2014-06-16 LAB — BASIC METABOLIC PANEL
Anion gap: 8 (ref 5–15)
BUN: 27 mg/dL — AB (ref 6–23)
CHLORIDE: 106 mmol/L (ref 96–112)
CO2: 23 mmol/L (ref 19–32)
CREATININE: 1.5 mg/dL — AB (ref 0.50–1.10)
Calcium: 8.5 mg/dL (ref 8.4–10.5)
GFR calc non Af Amer: 34 mL/min — ABNORMAL LOW (ref >90)
GFR, EST AFRICAN AMERICAN: 40 mL/min — AB (ref >90)
GLUCOSE: 163 mg/dL — AB (ref 70–99)
POTASSIUM: 4.1 mmol/L (ref 3.5–5.1)
Sodium: 137 mmol/L (ref 135–145)

## 2014-06-16 LAB — GLUCOSE, CAPILLARY: GLUCOSE-CAPILLARY: 106 mg/dL — AB (ref 70–99)

## 2014-06-16 MED ORDER — CARVEDILOL 3.125 MG PO TABS: 3.1250 mg | ORAL_TABLET | Freq: Two times a day (BID) | ORAL | Status: DC

## 2014-06-16 MED ORDER — GLIMEPIRIDE 2 MG PO TABS: 2.0000 mg | ORAL_TABLET | Freq: Every day | ORAL | Status: DC

## 2014-06-16 MED ORDER — PANTOPRAZOLE SODIUM 40 MG PO TBEC: 40.0000 mg | DELAYED_RELEASE_TABLET | Freq: Every day | ORAL | Status: DC

## 2014-06-16 MED ORDER — APIXABAN 5 MG PO TABS: 5.0000 mg | ORAL_TABLET | Freq: Two times a day (BID) | ORAL | Status: DC

## 2014-06-16 MED ORDER — LIDOCAINE 4 % EX CREA: TOPICAL_CREAM | Freq: Every day | CUTANEOUS | Status: DC | PRN

## 2014-06-16 MED ORDER — ALLOPURINOL 100 MG PO TABS: 50.0000 mg | ORAL_TABLET | Freq: Two times a day (BID) | ORAL | Status: DC

## 2014-06-16 MED ORDER — ATORVASTATIN CALCIUM 40 MG PO TABS: 40.0000 mg | ORAL_TABLET | Freq: Every day | ORAL | Status: DC

## 2014-06-16 NOTE — Progress Notes (Signed)
Per MD order, PICC line removed. Cath intact at 41cm. Vaseline pressure gauze to site, pressure held x 66min. No bleeding to site. Pt instructed to keep dressing CDI x 24 hours. Avoid heavy lifting, pushing or pulling x 24 hours,  If bleeding occurs hold pressure, if bleeding does not stop contact MD or go to the ED. All questions addressed with patient and she states understanding. [employer]

## 2014-06-16 NOTE — Discharge Summary (Signed)
Physician Discharge Summary   Patient ID: Jo Lynn MRN: [id number] DOB/AGE: 04-21-1945 69 y.o.  Admit date: 06/10/2014 Discharge date: 06/16/2014  Primary Care Physician:  Chevis Pretty, FNP  Discharge Diagnoses:      Acute inferoposterior ST segment elevation MI status post balloon angioplasty . Acute-on-chronic kidney injury    New atrial fibrillation . Essential hypertension . S/P ICD (internal cardiac defibrillator) procedure . CKD (chronic kidney disease), stage IV . Diabetes mellitus type 2, uncontrolled, hyperglycemia likely worsened due to prednisone  . Cardiomyopathy, ischemic - EF ~20-25%; Large MI, existing occluded RCA & SVG-RCA; h/o CABG . Tendonitis of ankle, right   Consults:  Cardiology, Dr Radford Pax Diabetic coordinator   Recommendations for Outpatient Follow-up:  Please note  transaminitis, repeat LFTs at the time of follow-up appointment  Repeat BMET at the follow-up appointment, needed on Monday, 06/20/14  Please follow CBGs and hemoglobin A1c, A1c 13.7 on 4/15 likely worsened due to steroids. Patient declined to be on insulin.  Patient is started on Eliquis for atrial fibrillation  TESTS THAT NEED FOLLOW-UP CBC, BMET, CBGs, hemoglobin A1c   DIET: Carb modified diet    Allergies:  No Known Allergies   Discharge Medications:   Medication List    STOP taking these medications        aspirin 81 MG EC tablet     colchicine 0.6 MG tablet     diclofenac sodium 1 % Gel  Commonly known as:  VOLTAREN     furosemide 40 MG tablet  Commonly known as:  LASIX     losartan 50 MG tablet  Commonly known as:  COZAAR     metolazone 2.5 MG tablet  Commonly known as:  ZAROXOLYN     metoprolol succinate 25 MG 24 hr tablet  Commonly known as:  TOPROL-XL      TAKE these medications        allopurinol 100 MG tablet  Commonly known as:  ZYLOPRIM  Take 0.5 tablets (50 mg total) by mouth 2 (two) times daily.     apixaban 5 MG Tabs tablet   Commonly known as:  ELIQUIS  Take 1 tablet (5 mg total) by mouth 2 (two) times daily.     atorvastatin 40 MG tablet  Commonly known as:  LIPITOR  Take 1 tablet (40 mg total) by mouth at bedtime.     benzonatate 100 MG capsule  Commonly known as:  TESSALON  Take 1 capsule (100 mg total) by mouth 2 (two) times daily as needed for cough.     carvedilol 3.125 MG tablet  Commonly known as:  COREG  Take 1 tablet (3.125 mg total) by mouth 2 (two) times daily with a meal.     clopidogrel 75 MG tablet  Commonly known as:  PLAVIX  Take 1 tablet (75 mg total) by mouth daily.     fluticasone 50 MCG/ACT nasal spray  Commonly known as:  FLONASE  Place 2 sprays into both nostrils daily.     glimepiride 2 MG tablet  Commonly known as:  AMARYL  Take 1 tablet (2 mg total) by mouth daily with breakfast.     lidocaine 4 % cream  Commonly known as:  LMX  Apply topically daily as needed (foot/leg pain).     nitroGLYCERIN 0.4 MG SL tablet  Commonly known as:  NITROSTAT  Place 1 tablet (0.4 mg total) under the tongue every 5 (five) minutes as needed for chest pain.     pantoprazole 40 MG  tablet  Commonly known as:  PROTONIX  Take 1 tablet (40 mg total) by mouth daily.     sitaGLIPtin 100 MG tablet  Commonly known as:  JANUVIA  Take 1 tablet (100 mg total) by mouth daily.         Brief H and P: For complete details please refer to admission H and P, but in brief The patient is a 69 year old female with ischemic cardiomyopathy, EF 20-25%, diabetes, stage IV chronic kidney disease presented with hyperglycemia. Patient was having right foot pain due to tendinitis and was started on oral prednisone. Patient reported generalized weakness at work and was found to have markedly elevated blood sugar in 500s. Patient presented to ED with hyperglycemia, not in DKA, BUN greater than 162, creatinine 4.17. Creatinine was 2.2 on 4/6. Patient was started on IV fluids and insulin.    Hospital Course:   After arrival to her room, she started nausea and vomiting, abdominal/chest discomfort. EKG showed inverted T waves in the lateral leads. Over the next few days, CBGs became more stable, staying below 200. Creatinine was trending down. On the morning of 4/18, patient complained of severe chest pain. EKG showed ST segment elevations in the inferior lateral leads with reciprocal ST depressions in anterior leads. Code STEMI was called and patient underwent urgent cardiac catheterization. She was found to have 99% blockage in the distal SVG to circumflex artery graft, was successfully ballooned. Patient also had episode of atrial fibrillation and underwent successful DC cardioversion emergently due to hypotension.    Diabetes mellitus type 2, uncontrolled: Likely worsened in the setting of prednisone. Hemoglobin A1c 13.7 CBGs better controlled as patient is off of prednisone. Discussed in detail with the patient, she does not want to be on insulin outpatient. Restarted Januvia, also added Amaryl. Appreciate nutrition recommendations to the patient    hypotension with history of Essential hypertension: BP has been stable but soft Please note patient is only on low-dose Coreg, Lasix, metolazone, ARB currently on hold.  acute inferoposterior ST segment elevation MI, history of Cardiomyopathy, ischemic - EF ~20-25%; Large MI, existing occluded RCA & SVG-RCA; h/o CABG Patient had been having episodes of chest pain prior to admission describing as indigestion. Troponin levels were minimally elevated in the setting of chronic kidney disease. Patient had severe chest pain on the morning of 04/18 and EKG showed ST segment abnormalities in the anterolateral leads suggestive of ischemia in the setting of rapid atrial fibrillation. Patient was taken to the Cath Lab for emergent cath and found to have 99% in-stent restenosis of distal SVG to an CX graft and underwent PTCA angioplasty withstent placement. Per  cardiology continue Plavix, Coreg. Statins are temporarily on hold secondary to transaminitis. Please check LFTs at the time of follow up appointment.  Hold on restarting ARB for now due to soft BP. I have called [place] office, they will call her with appointment to follow-up with Dr. Percival Spanish.  Paroxysmal atrial fibrillation with RVR Status post emergent DC CV, in the setting of STEMI. Patient was emergently cardioverted due to hypotension and was placed on amiodarone drip. Chads vasc score 6, patient was started on eliquis. Given chronic kidney disease, her renal function needs to be closely monitored.    acute on CKD (chronic kidney disease), stage IV - creatinine function improving, 1.5 , follow renal function closely, hold off on ARB due to hypotension and renal function. Lasix, metolazone are also on hold.  right ankle tendinitis - Avoid NSAIDs, currently holding  off prednisone due to significant hyperglycemia. Continue lidocaine cream, patient did well with physical therapy    Day of Discharge BP 92/53 mmHg  Pulse 78  Temp(Src) 98.8 F (37.1 C) (Oral)  Resp 18  Ht 5\' 7"  (1.702 m)  Wt 80.4 kg (177 lb 4 oz)  BMI 27.75 kg/m2  SpO2 100%  Physical Exam: General: Alert and awake oriented x3 not in any acute distress. HEENT: anicteric sclera, pupils reactive to light and accommodation CVS: S1-S2 clear no murmur rubs or gallops Chest: clear to auscultation bilaterally, no wheezing rales or rhonchi Abdomen: soft nontender, nondistended, normal bowel sounds Extremities: no cyanosis, clubbing or edema noted bilaterally Neuro: Cranial nerves II-XII intact, no focal neurological deficits   The results of significant diagnostics from this hospitalization (including imaging, microbiology, ancillary and laboratory) are listed below for reference.    LAB RESULTS: Basic Metabolic Panel:  Recent Labs Lab 06/15/14 1030 06/16/14 0430  NA 137 137  K 4.4 4.1  CL 105 106  CO2 25 23   GLUCOSE 181* 163*  BUN 29* 27*  CREATININE 1.53* 1.50*  CALCIUM 8.6 8.5   Liver Function Tests:  Recent Labs Lab 06/10/14 1433 06/11/14 0434  AST 30 23  ALT 22 17  ALKPHOS 80 73  BILITOT 1.9* 0.8  PROT 7.2 6.8  ALBUMIN 3.5 3.0*   No results for input(s): LIPASE, AMYLASE in the last 168 hours. No results for input(s): AMMONIA in the last 168 hours. CBC:  Recent Labs Lab 06/10/14 1433  06/15/14 0400 06/16/14 0430  WBC 13.8*  < > 8.2 7.3  NEUTROABS 12.4*  --   --   --   HGB 12.8  < > 9.8* 9.3*  HCT 37.7  < > 29.7* 28.0*  MCV 86.1  < > 87.4 87.2  PLT 150  < > 127* 125*  < > = values in this interval not displayed. Cardiac Enzymes:  Recent Labs Lab 06/11/14 2300 06/13/14 0925  TROPONINI 0.08* 0.06*   BNP: Invalid input(s): POCBNP CBG:  Recent Labs Lab 06/15/14 2255 06/16/14 0717  GLUCAP 160* 106*    Significant Diagnostic Studies:  No results found.     Disposition and Follow-up: Discharge Instructions    Diet Carb Modified    Complete by:  As directed      Discharge instructions    Complete by:  As directed   It is VERY IMPORTANT that you follow up with a PCP on a regular basis.  Check your blood glucoses before each meal and at bedtime and maintain a log of your readings.  Bring this log with you when you follow up with your PCP so that he or she can adjust your medications at your follow up visit.     Increase activity slowly    Complete by:  As directed             DISPOSITION: home    DISCHARGE FOLLOW-UP Follow-up Information    Follow up with Chevis Pretty, FNP. Schedule an appointment as soon as possible for a visit in 2 weeks.   Specialty:  Nurse Practitioner   Why:  for hospital follow-up   Contact information:   [place] [address] [phone]       Follow up with Minus Breeding, MD. Schedule an appointment as soon as possible for a visit in 1 week.   Specialty:  Cardiology   Why:  for  hospital follow-up   Contact information:   [address] [place] [address] [phone]        Time spent on Discharge: 45 mins  Signed:   RAI,RIPUDEEP M.D. [facility] 06/16/2014, 11:40 AM Pager: [phone]

## 2014-06-16 NOTE — Progress Notes (Signed)
PT Cancellation Note  Patient Details Name: Jo Lynn MRN: [id number] DOB: May 19, 1945   Cancelled Treatment:    Reason Eval/Treat Not Completed: PT screened, no needs identified, will sign off (Pt D/C from PT 4/19 due to baseline function and no needs. Reorder received 4/20 with no change in pt status. Will sign off at this time as confirmed with RN no change in pt status or function and no need for additional eval)   [name] 06/16/2014, 7:44 AM [name], [place]

## 2014-06-17 ENCOUNTER — Telehealth: Payer: Self-pay | Admitting: Cardiology

## 2014-06-17 NOTE — Telephone Encounter (Signed)
Pt informed furosemide discontinued in hospital, not restarted. Advised to monitor for symptoms, inform of any chgs.  Pt voiced understanding.

## 2014-06-17 NOTE — Telephone Encounter (Signed)
Please call,question about her Lasix. °

## 2014-06-22 ENCOUNTER — Other Ambulatory Visit: Payer: Self-pay | Admitting: Nurse Practitioner

## 2014-06-22 ENCOUNTER — Encounter: Payer: Self-pay | Admitting: Cardiology

## 2014-06-22 ENCOUNTER — Other Ambulatory Visit (INDEPENDENT_AMBULATORY_CARE_PROVIDER_SITE_OTHER): Payer: Medicare Other

## 2014-06-22 ENCOUNTER — Ambulatory Visit (INDEPENDENT_AMBULATORY_CARE_PROVIDER_SITE_OTHER): Payer: BLUE CROSS/BLUE SHIELD | Admitting: Cardiology

## 2014-06-22 ENCOUNTER — Encounter: Payer: Self-pay | Admitting: *Deleted

## 2014-06-22 VITALS — BP 114/68 | HR 57 | Ht 67.0 in | Wt 183.0 lb

## 2014-06-22 DIAGNOSIS — Z79899 Other long term (current) drug therapy: Secondary | ICD-10-CM | POA: Diagnosis not present

## 2014-06-22 DIAGNOSIS — I213 ST elevation (STEMI) myocardial infarction of unspecified site: Secondary | ICD-10-CM

## 2014-06-22 DIAGNOSIS — I10 Essential (primary) hypertension: Secondary | ICD-10-CM | POA: Diagnosis not present

## 2014-06-22 DIAGNOSIS — E119 Type 2 diabetes mellitus without complications: Secondary | ICD-10-CM

## 2014-06-22 MED ORDER — SITAGLIPTIN PHOSPHATE 100 MG PO TABS: 100.0000 mg | ORAL_TABLET | Freq: Every day | ORAL | Status: DC

## 2014-06-22 NOTE — Patient Instructions (Signed)
Medication Instructions:  Your physician recommends that you continue on your current medications as directed. Please refer to the Current Medication list given to you today.  Labwork: Please have bloodwork drawn at [facility]. (BMP)  Testing/Procedures: None  Follow-Up: In 6 weeks with Dr Percival Spanish in [place]  Thank you for choosing [facility]!!

## 2014-06-22 NOTE — Progress Notes (Signed)
HPI The patient presents for follow up after an acute inferior myocardial infarction with 2 bare-metal stents to a vein graft to obtuse marginal 1 and obtuse marginal 2 in July 2015. Marland Kitchen She finally agreed to have an ICD which was done.  She is now post hospitalization during which she developed acute STEMI.  She presented with some nausea her glucose was out of control having been treated with steroids for some foot pain. During that hospitalization she developed acute ST segment changes and was found to have 99% in-stent restenosis in the SVG to the circumflex. This was treated with balloon angioplasty alone. She also had atrial fibrillation during that presentation and was urgently cardioverted.  The patient denies any new symptoms such as chest discomfort, neck or arm discomfort. There has been no new shortness of breath, PND or orthopnea. There have been no reported palpitations, presyncope or syncope.   No Known Allergies  Current Outpatient Prescriptions  Medication Sig Dispense Refill  . allopurinol (ZYLOPRIM) 100 MG tablet Take 0.5 tablets (50 mg total) by mouth 2 (two) times daily.    Marland Kitchen apixaban (ELIQUIS) 5 MG TABS tablet Take 1 tablet (5 mg total) by mouth 2 (two) times daily. 60 tablet 0  . atorvastatin (LIPITOR) 40 MG tablet Take 1 tablet (40 mg total) by mouth at bedtime. 60 tablet 4  . benzonatate (TESSALON) 100 MG capsule Take 1 capsule (100 mg total) by mouth 2 (two) times daily as needed for cough. 20 capsule 0  . carvedilol (COREG) 3.125 MG tablet Take 1 tablet (3.125 mg total) by mouth 2 (two) times daily with a meal. 60 tablet 4  . clopidogrel (PLAVIX) 75 MG tablet Take 1 tablet (75 mg total) by mouth daily. 90 tablet 3  . fluticasone (FLONASE) 50 MCG/ACT nasal spray Place 2 sprays into both nostrils daily. 16 g 6  . glimepiride (AMARYL) 2 MG tablet Take 1 tablet (2 mg total) by mouth daily with breakfast. 30 tablet 3  . lidocaine (LMX) 4 % cream Apply topically daily as  needed (foot/leg pain). 30 g 3  . nitroGLYCERIN (NITROSTAT) 0.4 MG SL tablet Place 1 tablet (0.4 mg total) under the tongue every 5 (five) minutes as needed for chest pain. 90 tablet 3  . pantoprazole (PROTONIX) 40 MG tablet Take 1 tablet (40 mg total) by mouth daily. 30 tablet 2  . sitaGLIPtin (JANUVIA) 100 MG tablet Take 1 tablet (100 mg total) by mouth daily. 90 tablet 0   No current facility-administered medications for this visit.    Past Medical History  Diagnosis Date  . Myocardial infarction 10/2002    MV CAD --> Referred for CABG  . 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 Lucianne Lei Trigt: LIMA-LAD, SVG-RPDA, SVG-OM1-OM2  . ST elevation myocardial infarction (STEMI) of inferolateral wall 09/09/2013    SVG-Om1-2 occluded - PCI in 2 locations; Occluded SVG-RCA & native RCA, LAD & native Cx occcluded.  EF ~20%  . Cardiomyopathy, ischemic 09/09/2013    EF ~20-25%; Large MI, existing occluded RCA & SVG-RCA; h/o CABG  . 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 CAD S/P percutaneous coronary angioplasty 09/09/2013    mid SVG-OM1-OM2 (prox LIMB) Rebel BMS 4.0 mm x 16 mm; sequential limb OM1-OM2: Rebel BMS 4.0 mm x 12 mm  . Combined systolic and diastolic congestive heart failure, NYHA class 3 09/09/2013    Echo post Inferolateral STEMI: Severely reduced LVEF ~20-35%; Akinesis of Inferolateral, Inferior & Inferoseptal walls; Grade 1 DDysfxn (initial LVEDP ~26 mmHg with SBP of 99 mHg)  . Essential hypertension 08/17/2012  . Hyperlipidemia with target LDL less than 70 08/17/2012  . Diabetes mellitus type 2 with complications 1/96/2229  . AICD  (automatic cardioverter/defibrillator) present 02/21/2014    Single chamber - Dr. Lovena Le    Past Surgical History  Procedure Laterality Date  . Coronary artery bypass graft    . Left heart catheterization with coronary angiogram N/A 09/09/2013    Procedure: LEFT HEART CATHETERIZATION WITH CORONARY ANGIOGRAM;  Surgeon: Leonie Man, MD;  Location: [facility] CATH LAB;  Service: Cardiovascular;  Laterality: N/A;  . Implantable cardioverter defibrillator implant  02-21-2014    STJ single chamber ICD implanted by Dr Lovena Le for primary prevention  . Implantable cardioverter defibrillator implant N/A 02/21/2014    Procedure: IMPLANTABLE CARDIOVERTER DEFIBRILLATOR IMPLANT;  Surgeon: Evans Lance, MD;  Location: [facility] CATH LAB;  Service: Cardiovascular;  Laterality: N/A;  . Left heart catheterization with coronary angiogram N/A 06/13/2014    Procedure: LEFT HEART CATHETERIZATION WITH CORONARY ANGIOGRAM;  Surgeon: Jettie Booze, MD;  Location: [facility] CATH LAB;  Service: Cardiovascular;  Laterality: N/A;    ROS:  As stated in the HPI and negative for all other systems.   PHYSICAL EXAM BP 114/68 mmHg  Pulse 57  Ht 5\' 7"  (1.702 m)  Wt 183 lb (83.008 kg)  BMI 28.66 kg/m2 GENERAL:  Well appearing HEENT:  Pupils equal round and reactive, fundi not visualized, oral mucosa unremarkable NECK:  No jugular venous distention, waveform within normal limits, carotid upstroke brisk and symmetric, no bruits, no thyromegaly LUNGS:  Clear to auscultation bilaterally CHEST:  ICD scar intact HEART:  PMI not displaced or sustained,S1 and S2 within normal limits, no S3, no S4, no clicks, no rubs, no murmurs ABD:  Flat, positive bowel sounds normal in frequency in pitch, no bruits, no rebound, no guarding, no midline pulsatile mass, no hepatomegaly, no splenomegaly EXT:  2 plus pulses throughout, mild ankle edema, no cyanosis no clubbing SKIN:  No rashes no nodules NEURO:  Cranial nerves II - XII intact  ASSESSMENT  AND PLAN  CAD:   She is having no new cardiovascular symptoms since her recent angioplasty. No change in therapy is planned.  ATRIAL FIB:   She had this during her hospitalization but has had no recurrence.  I am not planning warfarin or NOAC. She remains on DAPT.   CARDIOMYOPATHY:   She is now status post ICD. She seems to be euvolemic. She will continue the meds as listed.   CKD:   I will follow up labs.   MR:  Moderate.  We will follow this clinically.   It was moderate on the echo in February when she was hospitalized   LEG PAIN:    She is more recently been told this was not gout. She will follow with her primary provider.  April records reviewed.

## 2014-06-23 LAB — BMP8+EGFR
BUN/Creatinine Ratio: 23 (ref 11–26)
BUN: 34 mg/dL — ABNORMAL HIGH (ref 8–27)
CALCIUM: 9.2 mg/dL (ref 8.7–10.3)
CO2: 22 mmol/L (ref 18–29)
Chloride: 110 mmol/L — ABNORMAL HIGH (ref 97–108)
Creatinine, Ser: 1.51 mg/dL — ABNORMAL HIGH (ref 0.57–1.00)
GFR calc Af Amer: 40 mL/min/{1.73_m2} — ABNORMAL LOW (ref >59)
GFR, EST NON AFRICAN AMERICAN: 35 mL/min/{1.73_m2} — AB (ref >59)
GLUCOSE: 148 mg/dL — AB (ref 65–99)
Potassium: 4.9 mmol/L (ref 3.5–5.2)
Sodium: 145 mmol/L — ABNORMAL HIGH (ref 134–144)

## 2014-06-23 NOTE — Telephone Encounter (Signed)
Family aware ,script was sent in.

## 2014-06-23 NOTE — Telephone Encounter (Signed)
Done yesterday.

## 2014-07-11 ENCOUNTER — Telehealth: Payer: Self-pay | Admitting: Nurse Practitioner

## 2014-07-11 ENCOUNTER — Telehealth: Payer: Self-pay | Admitting: Cardiology

## 2014-07-11 NOTE — Telephone Encounter (Signed)
Not on med list

## 2014-07-11 NOTE — Telephone Encounter (Signed)
Patient aware that she was suppose to stop this medication based on her chart.

## 2014-07-11 NOTE — Telephone Encounter (Signed)
°  1. Which medications need to be refilled? Allopurinol,Eliquist,Atorvastatin,Carvedilol,Pantoprazole,Furosemide and Clopidogrel-please send this in today.  2. Which pharmacy is medication to be sent to?[facility]  3. Do they need a 30 day or 90 day supply? 90 and refills  4. Would they like a call back once the medication has been sent to the pharmacy?yes*

## 2014-07-11 NOTE — Telephone Encounter (Signed)
Do ot have toprol on list and do  Not know dose

## 2014-07-12 ENCOUNTER — Encounter: Payer: Self-pay | Admitting: Internal Medicine

## 2014-07-12 ENCOUNTER — Ambulatory Visit (INDEPENDENT_AMBULATORY_CARE_PROVIDER_SITE_OTHER): Payer: BLUE CROSS/BLUE SHIELD | Admitting: Internal Medicine

## 2014-07-12 ENCOUNTER — Other Ambulatory Visit: Payer: Self-pay | Admitting: *Deleted

## 2014-07-12 VITALS — BP 136/78 | HR 84 | Ht 67.0 in | Wt 194.0 lb

## 2014-07-12 DIAGNOSIS — I5023 Acute on chronic systolic (congestive) heart failure: Secondary | ICD-10-CM | POA: Diagnosis not present

## 2014-07-12 DIAGNOSIS — I255 Ischemic cardiomyopathy: Secondary | ICD-10-CM | POA: Diagnosis not present

## 2014-07-12 DIAGNOSIS — Z95810 Presence of automatic (implantable) cardiac defibrillator: Secondary | ICD-10-CM

## 2014-07-12 DIAGNOSIS — I480 Paroxysmal atrial fibrillation: Secondary | ICD-10-CM

## 2014-07-12 LAB — CUP PACEART INCLINIC DEVICE CHECK
Brady Statistic RV Percent Paced: 0.03 %
Date Time Interrogation Session: 20160517144015
HighPow Impedance: 50.625
Lead Channel Impedance Value: 475 Ohm
Lead Channel Pacing Threshold Amplitude: 0.75 V
Lead Channel Pacing Threshold Pulse Width: 0.5 ms
Lead Channel Setting Pacing Amplitude: 2.5 V
MDC IDC MSMT BATTERY REMAINING LONGEVITY: 100.8 mo
MDC IDC MSMT LEADCHNL RV SENSING INTR AMPL: 11.6 mV
MDC IDC SET LEADCHNL RV PACING PULSEWIDTH: 0.5 ms
MDC IDC SET LEADCHNL RV SENSING SENSITIVITY: 0.5 mV
MDC IDC SET ZONE DETECTION INTERVAL: 270 ms
MDC IDC SET ZONE DETECTION INTERVAL: 300 ms
Pulse Gen Serial Number: 7233600
Zone Setting Detection Interval: 350 ms

## 2014-07-12 MED ORDER — APIXABAN 5 MG PO TABS: 5.0000 mg | ORAL_TABLET | Freq: Two times a day (BID) | ORAL | Status: DC

## 2014-07-12 MED ORDER — CARVEDILOL 3.125 MG PO TABS: 3.1250 mg | ORAL_TABLET | Freq: Two times a day (BID) | ORAL | Status: DC

## 2014-07-12 MED ORDER — CLOPIDOGREL BISULFATE 75 MG PO TABS: 75.0000 mg | ORAL_TABLET | Freq: Every day | ORAL | Status: DC

## 2014-07-12 MED ORDER — PANTOPRAZOLE SODIUM 40 MG PO TBEC: 40.0000 mg | DELAYED_RELEASE_TABLET | Freq: Every day | ORAL | Status: DC

## 2014-07-12 MED ORDER — FUROSEMIDE 40 MG PO TABS: 40.0000 mg | ORAL_TABLET | ORAL | Status: DC

## 2014-07-12 MED ORDER — ATORVASTATIN CALCIUM 40 MG PO TABS: 40.0000 mg | ORAL_TABLET | Freq: Every day | ORAL | Status: DC

## 2014-07-12 NOTE — Assessment & Plan Note (Signed)
She appears to be maintaining NSR. I have asked the patient to continue her current meds.

## 2014-07-12 NOTE — Telephone Encounter (Signed)
Refill all meds let PCP refill allopurinol

## 2014-07-12 NOTE — Telephone Encounter (Signed)
Patient aware to contact pcp for allopurinol.

## 2014-07-12 NOTE — Progress Notes (Signed)
HPI Jo Lynn returns today for ongoing followup after ICD implantation. She is a very pleasant 69 year old woman who sustained a myocardial infarction approximately 9 months ago. She is status post percutaneous intervention. Post procedure, she has persistent left ventricular dysfunction, despite maximal medical therapy. She has an ejection fraction of 25% by echo. She has been on diuretic therapy, losartan, and metoprolol. Her blood pressures have been very well controlled. She also complains of problems with discomfort in her legs when she walks. Her heart failure symptoms are class II. She has not had syncope. She has minimal peripheral edema. No chest pain. She has had an embolism to her right leg. She is now on Eliquis. She has had problems with lower leg swelling. She admits to sodium indiscretion.   Current Outpatient Prescriptions  Medication Sig Dispense Refill  . allopurinol (ZYLOPRIM) 100 MG tablet Take 0.5 tablets (50 mg total) by mouth 2 (two) times daily.    Marland Kitchen apixaban (ELIQUIS) 5 MG TABS tablet Take 1 tablet (5 mg total) by mouth 2 (two) times daily. 60 tablet 0  . atorvastatin (LIPITOR) 40 MG tablet Take 1 tablet (40 mg total) by mouth at bedtime. 60 tablet 4  . carvedilol (COREG) 3.125 MG tablet Take 1 tablet (3.125 mg total) by mouth 2 (two) times daily with a meal. 60 tablet 4  . clopidogrel (PLAVIX) 75 MG tablet Take 1 tablet (75 mg total) by mouth daily. 90 tablet 3  . fluticasone (FLONASE) 50 MCG/ACT nasal spray Place 2 sprays into both nostrils daily. 16 g 6  . glimepiride (AMARYL) 2 MG tablet Take 1 tablet (2 mg total) by mouth daily with breakfast. 30 tablet 3  . lidocaine (LMX) 4 % cream Apply topically daily as needed (foot/leg pain). 30 g 3  . nitroGLYCERIN (NITROSTAT) 0.4 MG SL tablet Place 1 tablet (0.4 mg total) under the tongue every 5 (five) minutes as needed for chest pain. 90 tablet 3  . pantoprazole (PROTONIX) 40 MG tablet Take 1 tablet (40 mg total)  by mouth daily. 30 tablet 2  . sitaGLIPtin (JANUVIA) 100 MG tablet Take 1 tablet (100 mg total) by mouth daily. 90 tablet 1   No current facility-administered medications for this visit.     Past Medical History  Diagnosis Date  . Myocardial infarction 10/2002    MV CAD --> Referred for CABG  . 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 Lucianne Lei Trigt: LIMA-LAD, SVG-RPDA, SVG-OM1-OM2  . ST elevation myocardial infarction (STEMI) of inferolateral wall 2015, 2016    SVG-Om1-2 occluded - PCI in 2 locations; Occluded SVG-RCA & native RCA, LAD & native Cx occcluded.  EF ~20%.  SVG to OM with 99% stenosis treated with balloon angioplasty.  05/2024.   . Cardiomyopathy, ischemic 09/09/2013    EF ~20-25%; Large MI, existing occluded RCA & SVG-RCA; h/o CABG  . 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 CAD S/P percutaneous coronary angioplasty 09/09/2013    mid SVG-OM1-OM2 (prox LIMB) Rebel BMS 4.0 mm x 16 mm; sequential limb OM1-OM2: Rebel BMS 4.0  mm x 12 mm  . Combined systolic and diastolic congestive heart failure, NYHA class 3 09/09/2013    Echo post Inferolateral STEMI: Severely reduced LVEF ~20-35%; Akinesis of Inferolateral, Inferior & Inferoseptal walls; Grade 1 DDysfxn (initial LVEDP ~26 mmHg with SBP of 99 mHg)  . Essential hypertension 08/17/2012  . Hyperlipidemia with target LDL less than 70 08/17/2012  . Diabetes mellitus type 2 with complications 10/19/537  . 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 Lovena Le    ROS:   All systems reviewed and negative except as noted in the HPI.   Past  Surgical History  Procedure Laterality Date  . Coronary artery bypass graft    . Left heart catheterization with coronary angiogram N/A 09/09/2013    Procedure: LEFT HEART CATHETERIZATION WITH CORONARY ANGIOGRAM;  Surgeon: Leonie Man, MD;  Location: [facility] CATH LAB;  Service: Cardiovascular;  Laterality: N/A;  . Implantable cardioverter defibrillator implant  02-21-2014    STJ single chamber ICD implanted by Dr Lovena Le for primary prevention  . Implantable cardioverter defibrillator implant N/A 02/21/2014    Procedure: IMPLANTABLE CARDIOVERTER DEFIBRILLATOR IMPLANT;  Surgeon: Evans Lance, MD;  Location: [facility] CATH LAB;  Service: Cardiovascular;  Laterality: N/A;  . Left heart catheterization with coronary angiogram N/A 06/13/2014    Procedure: LEFT HEART CATHETERIZATION WITH CORONARY ANGIOGRAM;  Surgeon: Jettie Booze, MD;  Location: [facility] CATH LAB;  Service: Cardiovascular;  Laterality: N/A;     Family History  Problem Relation Age of Onset  . Diabetes Mother   . Diabetes Father   . Heart attack Neg Hx   . Stroke Neg Hx      History   Social History  . Marital Status: Single    Spouse Name: N/A  . Number of Children: 4  . Years of Education: N/A   Occupational History  . Not on file.   Social History Main Topics  . Smoking status: Former Smoker    Types: Cigarettes  . Smokeless tobacco: Never Used  . Alcohol Use: No  . Drug Use: No  . Sexual Activity: Not on file   Other Topics Concern  . Not on file   Social History Narrative     BP 136/78 mmHg  Pulse 84  Ht 5\' 7"  (1.702 m)  Wt 194 lb (87.998 kg)  BMI 30.38 kg/m2  Physical Exam:  Stable  appearing  69 year old woman, NAD HEENT: Unremarkable Neck:  7 cm  JVD, no thyromegally Back:  No CVA tenderness Lungs:  Clear with no wheezes, rales, or rhonchi.  HEART:  Regular rate rhythm, no murmurs, no rubs, no clicks Abd:  soft, positive bowel sounds, no organomegally, no rebound, no guarding Ext:  2 plus  pulses, 2+ peripheral edema, no cyanosis, no clubbing Skin:  No rashes no nodules Neuro:  CN II through XII intact, motor grossly intact   Chest x-ray - reviewed  Assess/Plan:

## 2014-07-12 NOTE — Patient Instructions (Addendum)
Medication Instructions:  Your physician has recommended you make the following change in your medication:  1) Start Furosemide 40 mg every other day   Labwork: Your physician recommends that you return for lab work in 1 week for a BMP   Testing/Procedures: None ordered  Follow-Up: Your physician wants you to follow-up in: December with Dr Knox Saliva will receive a reminder letter in the mail two months in advance. If you don't receive a letter, please call our office to schedule the follow-up appointment.   Any Other Special Instructions Will Be Listed Below (If Applicable).

## 2014-07-12 NOTE — Assessment & Plan Note (Signed)
Her symptoms have increased off of lasix. She will return in 2 weeks. We will start her back on low dose lasix. Will plan to check her labs in a few weeks.

## 2014-07-12 NOTE — Telephone Encounter (Signed)
Ok to refill all requested medications including the allopurinol? Please advise. Thanks, MI

## 2014-07-12 NOTE — Assessment & Plan Note (Signed)
Her [employer] ICD is working normally. Will recheck in several months. 

## 2014-07-12 NOTE — Assessment & Plan Note (Signed)
Her blood pressure is increased. I have asked her to reduce her sodium intake. She will continue her current meds and take some lasix.

## 2014-07-19 ENCOUNTER — Telehealth: Payer: Self-pay | Admitting: Cardiology

## 2014-07-19 NOTE — Telephone Encounter (Signed)
Pt need prior authorization for her Eliquis. She think they sent a fax last night.

## 2014-07-21 ENCOUNTER — Other Ambulatory Visit (INDEPENDENT_AMBULATORY_CARE_PROVIDER_SITE_OTHER): Payer: BLUE CROSS/BLUE SHIELD | Admitting: *Deleted

## 2014-07-21 DIAGNOSIS — I5023 Acute on chronic systolic (congestive) heart failure: Secondary | ICD-10-CM

## 2014-07-21 LAB — BASIC METABOLIC PANEL
BUN: 38 mg/dL — ABNORMAL HIGH (ref 6–23)
CHLORIDE: 105 meq/L (ref 96–112)
CO2: 29 meq/L (ref 19–32)
CREATININE: 1.38 mg/dL — AB (ref 0.40–1.20)
Calcium: 9.3 mg/dL (ref 8.4–10.5)
GFR: 48.58 mL/min — AB (ref >60.00)
Glucose, Bld: 109 mg/dL — ABNORMAL HIGH (ref 70–99)
POTASSIUM: 4.2 meq/L (ref 3.5–5.1)
Sodium: 141 mEq/L (ref 135–145)

## 2014-08-03 ENCOUNTER — Encounter: Payer: Self-pay | Admitting: Cardiology

## 2014-08-03 ENCOUNTER — Ambulatory Visit (INDEPENDENT_AMBULATORY_CARE_PROVIDER_SITE_OTHER): Payer: BLUE CROSS/BLUE SHIELD | Admitting: Cardiology

## 2014-08-03 VITALS — BP 120/80 | HR 104 | Ht 66.0 in | Wt 194.0 lb

## 2014-08-03 DIAGNOSIS — I255 Ischemic cardiomyopathy: Secondary | ICD-10-CM | POA: Diagnosis not present

## 2014-08-03 MED ORDER — ASPIRIN EC 81 MG PO TBEC: 81.0000 mg | DELAYED_RELEASE_TABLET | Freq: Every day | ORAL | Status: AC

## 2014-08-03 NOTE — Progress Notes (Signed)
HPI The patient presents for follow up after an acute inferior myocardial infarction with 2 bare-metal stents to a vein graft to obtuse marginal 1 and obtuse marginal 2 in July 2015. Jo Lynn She finally agreed to have an ICD which was done.  In April she had hospitalization during which she developed acute STEMI.  She presented with some nausea her glucose was out of control having been treated with steroids for some foot pain. During that hospitalization she developed acute ST segment changes and was found to have 99% in-stent restenosis in the SVG to the circumflex. This was treated with balloon angioplasty alone. She also had atrial fibrillation during that presentation and was urgently cardioverted.    Since I saw her she's had no new cardiovascular complaints. Her weight is up but she's eating more. She denies any new chest pain, neck or arm discomfort. She's had no new palpitations, presyncope or syncope. Continues to have foot pain and is treated for gout.   Allergies  Allergen Reactions  . Prednisone     "Did not feel good at all"    Current Outpatient Prescriptions  Medication Sig Dispense Refill  . allopurinol (ZYLOPRIM) 100 MG tablet Take 0.5 tablets (50 mg total) by mouth 2 (two) times daily.    Jo Lynn apixaban (ELIQUIS) 5 MG TABS tablet Take 1 tablet (5 mg total) by mouth 2 (two) times daily. 180 tablet 0  . atorvastatin (LIPITOR) 40 MG tablet Take 1 tablet (40 mg total) by mouth at bedtime. 90 tablet 0  . carvedilol (COREG) 3.125 MG tablet Take 1 tablet (3.125 mg total) by mouth 2 (two) times daily with a meal. 180 tablet 0  . clopidogrel (PLAVIX) 75 MG tablet Take 1 tablet (75 mg total) by mouth daily. 90 tablet 0  . fluticasone (FLONASE) 50 MCG/ACT nasal spray Place 2 sprays into both nostrils daily. 16 g 6  . furosemide (LASIX) 40 MG tablet Take 1 tablet (40 mg total) by mouth every other day. 45 tablet 3  . glimepiride (AMARYL) 2 MG tablet Take 1 tablet (2 mg total) by mouth daily  with breakfast. 30 tablet 3  . lidocaine (LMX) 4 % cream Apply topically daily as needed (foot/leg pain). 30 g 3  . nitroGLYCERIN (NITROSTAT) 0.4 MG SL tablet Place 1 tablet (0.4 mg total) under the tongue every 5 (five) minutes as needed for chest pain. 90 tablet 3  . pantoprazole (PROTONIX) 40 MG tablet Take 1 tablet (40 mg total) by mouth daily. 90 tablet 0  . sitaGLIPtin (JANUVIA) 100 MG tablet Take 1 tablet (100 mg total) by mouth daily. 90 tablet 1   No current facility-administered medications for this visit.    Past Medical History  Diagnosis Date  . Myocardial infarction 10/2002    MV CAD --> Referred for CABG  . 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 Lucianne Lei Trigt: LIMA-LAD, SVG-RPDA, SVG-OM1-OM2  . ST elevation myocardial infarction (STEMI) of inferolateral wall 2015, 2016    SVG-Om1-2 occluded - PCI in 2 locations; Occluded SVG-RCA & native RCA, LAD & native Cx occcluded.  EF ~20%.  SVG to OM with 99% stenosis treated with balloon angioplasty.  05/2024.   . Cardiomyopathy, ischemic 09/09/2013    EF ~20-25%; Large MI, existing occluded RCA & SVG-RCA;  h/o CABG  . 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 CAD S/P percutaneous coronary angioplasty 09/09/2013    mid SVG-OM1-OM2 (prox LIMB) Rebel BMS 4.0 mm x 16 mm; sequential limb OM1-OM2: Rebel BMS 4.0 mm x 12 mm  . Combined systolic and diastolic congestive heart failure, NYHA class 3 09/09/2013    Echo post Inferolateral STEMI: Severely reduced LVEF ~20-35%; Akinesis of Inferolateral, Inferior & Inferoseptal walls; Grade 1 DDysfxn (initial LVEDP ~26 mmHg with SBP of 99 mHg)  .  Essential hypertension 08/17/2012  . Hyperlipidemia with target LDL less than 70 08/17/2012  . Diabetes mellitus type 2 with complications 03/15/1476  . 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 Lovena Le    Past Surgical History  Procedure Laterality Date  . Coronary artery bypass graft    . Left heart catheterization with coronary angiogram N/A 09/09/2013    Procedure: LEFT HEART CATHETERIZATION WITH CORONARY ANGIOGRAM;  Surgeon: Leonie Man, MD;  Location: [facility] CATH LAB;  Service: Cardiovascular;  Laterality: N/A;  . Implantable cardioverter defibrillator implant  02-21-2014    STJ single chamber ICD implanted by Dr Lovena Le for primary prevention  . Implantable cardioverter defibrillator implant N/A 02/21/2014    Procedure: IMPLANTABLE CARDIOVERTER DEFIBRILLATOR IMPLANT;  Surgeon: Evans Lance, MD;  Location[facility] CATH LAB;  Service: Cardiovascular;  Laterality: N/A;  . Left heart catheterization with coronary angiogram N/A 06/13/2014    Procedure: LEFT HEART CATHETERIZATION WITH CORONARY ANGIOGRAM;  Surgeon: Jettie Booze, MD;  Location: [facility] CATH LAB;  Service: Cardiovascular;  Laterality: N/A;    ROS:  As stated in the HPI and negative for all other systems.   PHYSICAL EXAM BP 120/80 mmHg  Pulse 104  Ht 5\' 6"  (1.676 m)  Wt 194 lb (87.998 kg)  BMI 31.33 kg/m2 GENERAL:  Well appearing HEENT:  Pupils equal round and reactive, fundi not visualized, oral mucosa unremarkable NECK:  No jugular venous distention, waveform within normal limits, carotid upstroke brisk and symmetric, no bruits, no thyromegaly LUNGS:  Clear to auscultation bilaterally CHEST:  ICD scar intact HEART:  PMI not displaced or sustained,S1 and S2 within normal limits, no S3, no S4, no clicks, no rubs, no murmurs ABD:  Flat, positive bowel sounds normal in frequency in pitch, no bruits, no rebound, no guarding, no midline pulsatile mass, no hepatomegaly, no  splenomegaly EXT:  2 plus pulses throughout, mild ankle edema, no cyanosis no clubbing SKIN:  No rashes no nodules NEURO:  Cranial nerves II - XII intact  ASSESSMENT AND PLAN  CAD:   She is having no new cardiovascular symptoms since her recent angioplasty. No change in therapy is planned.  I will continue anticoagulation as below.  ATRIAL FIB:   She had this during her hospitalization in April.  At that time she also had PCI. I reviewed this extensively. At this point I would like to continue the Eliquis.  I will use a low dose of aspirin 81 mg given the significant extent of her coronary artery disease.  CARDIOMYOPATHY:   She is now status post ICD. She seems to be euvolemic. She will continue the meds as listed.   We have reviewed again salt and fluid restriction and when necessary dosing of her diuretic..  CKD:  Her creatinine stable and I reviewed the recent blood work.  MR:  Moderate.  We will follow this clinically.   I will follow with an echo at one year.   LEG PAIN:    She is more recently been told this was not gout. She will follow with her primary provider.

## 2014-08-03 NOTE — Patient Instructions (Signed)
Medication Instructions:  Please stop Plavix and start ASA 81 mg a day. Continue all other medications as listed.  Follow-Up: In 6 weeks with Dr Percival Spanish in [place].  Thank you for choosing [facility]!!

## 2014-08-24 ENCOUNTER — Encounter: Payer: Self-pay | Admitting: Nurse Practitioner

## 2014-09-05 ENCOUNTER — Telehealth: Payer: Self-pay | Admitting: Cardiology

## 2014-09-05 NOTE — Telephone Encounter (Signed)
Pt's daughter called in stating that her Losartan prescription needed to be authorized before being refilled. This authorization needs to be sent to [facility] in [place].   Thanks

## 2014-09-05 NOTE — Telephone Encounter (Signed)
Informed daughter losartan not on active medlist - she voiced understanding.

## 2014-09-14 ENCOUNTER — Ambulatory Visit (INDEPENDENT_AMBULATORY_CARE_PROVIDER_SITE_OTHER): Payer: BLUE CROSS/BLUE SHIELD | Admitting: Cardiology

## 2014-09-14 ENCOUNTER — Encounter: Payer: Self-pay | Admitting: Cardiology

## 2014-09-14 VITALS — BP 89/63 | HR 88 | Ht 67.0 in | Wt 195.0 lb

## 2014-09-14 DIAGNOSIS — I2510 Atherosclerotic heart disease of native coronary artery without angina pectoris: Secondary | ICD-10-CM | POA: Diagnosis not present

## 2014-09-14 DIAGNOSIS — I255 Ischemic cardiomyopathy: Secondary | ICD-10-CM

## 2014-09-14 NOTE — Progress Notes (Signed)
HPI The patient presents for follow up after an acute inferior myocardial infarction with 2 bare-metal stents to a vein graft to obtuse marginal 1 and obtuse marginal 2 in July 2015. Jo Lynn She finally agreed to have an ICD which was done.  In April she had hospitalization during which she developed acute STEMI.  She presented with some nausea her glucose was out of control having been treated with steroids for some foot pain. During that hospitalization she developed acute ST segment changes and was found to have 99% in-stent restenosis in the SVG to the circumflex. This was treated with balloon angioplasty alone. She also had atrial fibrillation during that presentation and was urgently cardioverted.    Since I last saw her she's had no new cardiovascular complaints. We have had to clarify her medications and she was still getting these confused even after the last visit. She has a dry nonproductive cough. Otherwise she is denying any cardiovascular symptoms. She's not having any new shortness of breath, PND or orthopnea. She's not noticing any palpitations, presyncope or syncope. She has no chest pressure, neck or arm discomfort. She's had no fevers or chills. Her blood pressure is low but she's not describing any symptoms related to this.  Allergies  Allergen Reactions  . Prednisone     "Did not feel good at all"    Current Outpatient Prescriptions  Medication Sig Dispense Refill  . allopurinol (ZYLOPRIM) 100 MG tablet Take 0.5 tablets (50 mg total) by mouth 2 (two) times daily.    Jo Lynn apixaban (ELIQUIS) 5 MG TABS tablet Take 1 tablet (5 mg total) by mouth 2 (two) times daily. 180 tablet 0  . aspirin EC 81 MG tablet Take 1 tablet (81 mg total) by mouth daily.    Jo Lynn atorvastatin (LIPITOR) 40 MG tablet Take 1 tablet (40 mg total) by mouth at bedtime. 90 tablet 0  . benzonatate (TESSALON) 100 MG capsule Take 100 mg by mouth 2 (two) times daily as needed for cough.     . carvedilol (COREG) 3.125 MG  tablet Take 1 tablet (3.125 mg total) by mouth 2 (two) times daily with a meal. 180 tablet 0  . fluticasone (FLONASE) 50 MCG/ACT nasal spray Place 2 sprays into both nostrils daily. 16 g 6  . furosemide (LASIX) 40 MG tablet Take 1 tablet (40 mg total) by mouth every other day. 45 tablet 3  . glimepiride (AMARYL) 2 MG tablet Take 1 tablet (2 mg total) by mouth daily with breakfast. 30 tablet 3  . lidocaine (LMX) 4 % cream Apply topically daily as needed (foot/leg pain). 30 g 3  . nitroGLYCERIN (NITROSTAT) 0.4 MG SL tablet Place 1 tablet (0.4 mg total) under the tongue every 5 (five) minutes as needed for chest pain. 90 tablet 3  . pantoprazole (PROTONIX) 40 MG tablet Take 1 tablet (40 mg total) by mouth daily. 90 tablet 0  . sitaGLIPtin (JANUVIA) 100 MG tablet Take 1 tablet (100 mg total) by mouth daily. 90 tablet 1   No current facility-administered medications for this visit.    Past Medical History  Diagnosis Date  . Myocardial infarction 10/2002    MV CAD --> Referred for CABG  . 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 Lucianne Lei Trigt: LIMA-LAD, SVG-RPDA, SVG-OM1-OM2  .  ST elevation myocardial infarction (STEMI) of inferolateral wall 2015, 2016    SVG-Om1-2 occluded - PCI in 2 locations; Occluded SVG-RCA & native RCA, LAD & native Cx occcluded.  EF ~20%.  SVG to OM with 99% stenosis treated with balloon angioplasty.  05/2024.   . Cardiomyopathy, ischemic 09/09/2013    EF ~20-25%; Large MI, existing occluded RCA & SVG-RCA; h/o CABG  . 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 CAD S/P percutaneous coronary angioplasty 09/09/2013    mid  SVG-OM1-OM2 (prox LIMB) Rebel BMS 4.0 mm x 16 mm; sequential limb OM1-OM2: Rebel BMS 4.0 mm x 12 mm  . Combined systolic and diastolic congestive heart failure, NYHA class 3 09/09/2013    Echo post Inferolateral STEMI: Severely reduced LVEF ~20-35%; Akinesis of Inferolateral, Inferior & Inferoseptal walls; Grade 1 DDysfxn (initial LVEDP ~26 mmHg with SBP of 99 mHg)  . Essential hypertension 08/17/2012  . Hyperlipidemia with target LDL less than 70 08/17/2012  . Diabetes mellitus type 2 with complications 08/26/6376  . 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 Lovena Le    Past Surgical History  Procedure Laterality Date  . Coronary artery bypass graft    . Left heart catheterization with coronary angiogram N/A 09/09/2013    Procedure: LEFT HEART CATHETERIZATION WITH CORONARY ANGIOGRAM;  Surgeon: Leonie Man, MD;  Location: [facility] CATH LAB;  Service: Cardiovascular;  Laterality: N/A;  . Implantable cardioverter defibrillator implant  02-21-2014    STJ single chamber ICD implanted by Dr Lovena Le for primary prevention  . Implantable cardioverter defibrillator implant N/A 02/21/2014    Procedure: IMPLANTABLE CARDIOVERTER DEFIBRILLATOR IMPLANT;  Surgeon: Evans Lance, MD;  Location: [facility] CATH LAB;  Service: Cardiovascular;  Laterality: N/A;  . Left heart catheterization with coronary angiogram N/A 06/13/2014    Procedure: LEFT HEART CATHETERIZATION WITH CORONARY ANGIOGRAM;  Surgeon: Jettie Booze, MD;  Location: [facility] C[facility] LAB;  Service: Cardiovascular;  Laterality: N/A;    ROS:  As stated in the HPI and negative for all other systems.   PHYSICAL EXAM BP 89/63 mmHg  Pulse 88  Ht 5\' 7"  (1.702 m)  Wt 195 lb (88.451 kg)  BMI 30.53 kg/m2 GENERAL:  Well appearing NECK:  No jugular venous distention, waveform within normal limits, carotid upstroke brisk and symmetric, no bruits, no thyromegaly LUNGS:  Clear to auscultation bilaterally CHEST:  ICD scar  intact HEART:  PMI not displaced or sustained,S1 and S2 within normal limits, no S3, no S4, no clicks, no rubs, no murmurs ABD:  Flat, positive bowel sounds normal in frequency in pitch, no bruits, no rebound, no guarding, no midline pulsatile mass, no hepatomegaly, no splenomegaly EXT:  2 plus pulses throughout, mild ankle edema, no cyanosis no clubbing   ASSESSMENT AND PLAN  CAD:   She is having no new cardiovascular symptoms since her recent angioplasty. No change in therapy is planned.  I will continue anticoagulation as below.  ATRIAL FIB:   She had this during her hospitalization in April.   At this point I would like to continue the Eliquis.  I will use a low dose of aspirin 81 mg given the significant extent of her coronary artery disease.  CARDIOMYOPATHY:   She is now status post ICD. She seems to  be euvolemic. She will continue the meds as listed.   With her low blood pressure she cannot tolerate med titration or addition of an ACE inhibitor.  CKD:  Her creatinine stable.  MR:  Moderate.  We will follow this clinically.   I will follow with an echo at one year.   COUGH:  This seems to be seasonal. I reviewed this with her. She will stop Tessalon.

## 2014-09-14 NOTE — Patient Instructions (Signed)
Medication Instructions:  Your physician recommends that you continue on your current medications as directed. Please refer to the Current Medication list given to you today.  Follow-Up: Follow up in 6 months with Dr. Hochrein.  You will receive a letter in the mail 2 months before you are due.  Please call us when you receive this letter to schedule your follow up appointment.  Thank you for choosing  [facility]!!       

## 2014-09-26 DIAGNOSIS — K559 Vascular disorder of intestine, unspecified: Secondary | ICD-10-CM

## 2014-09-26 HISTORY — DX: Vascular disorder of intestine, unspecified: K55.9

## 2014-09-28 ENCOUNTER — Encounter (HOSPITAL_COMMUNITY): Payer: Self-pay | Admitting: Emergency Medicine

## 2014-09-28 ENCOUNTER — Emergency Department (HOSPITAL_COMMUNITY): Payer: BLUE CROSS/BLUE SHIELD

## 2014-09-28 ENCOUNTER — Observation Stay (HOSPITAL_COMMUNITY)
Admission: EM | Admit: 2014-09-28 | Discharge: 2014-09-29 | Disposition: A | Discharge status: Home / Self Care | Payer: BLUE CROSS/BLUE SHIELD | Attending: Internal Medicine | Admitting: Internal Medicine

## 2014-09-28 DIAGNOSIS — Z888 Allergy status to other drugs, medicaments and biological substances status: Secondary | ICD-10-CM | POA: Insufficient documentation

## 2014-09-28 DIAGNOSIS — E1165 Type 2 diabetes mellitus with hyperglycemia: Secondary | ICD-10-CM

## 2014-09-28 DIAGNOSIS — Z7982 Long term (current) use of aspirin: Secondary | ICD-10-CM | POA: Diagnosis not present

## 2014-09-28 DIAGNOSIS — R945 Abnormal results of liver function studies: Secondary | ICD-10-CM | POA: Diagnosis not present

## 2014-09-28 DIAGNOSIS — A09 Infectious gastroenteritis and colitis, unspecified: Secondary | ICD-10-CM | POA: Diagnosis not present

## 2014-09-28 DIAGNOSIS — N184 Chronic kidney disease, stage 4 (severe): Secondary | ICD-10-CM | POA: Diagnosis not present

## 2014-09-28 DIAGNOSIS — I480 Paroxysmal atrial fibrillation: Secondary | ICD-10-CM

## 2014-09-28 DIAGNOSIS — R748 Abnormal levels of other serum enzymes: Secondary | ICD-10-CM | POA: Diagnosis not present

## 2014-09-28 DIAGNOSIS — R109 Unspecified abdominal pain: Secondary | ICD-10-CM

## 2014-09-28 DIAGNOSIS — N179 Acute kidney failure, unspecified: Secondary | ICD-10-CM

## 2014-09-28 DIAGNOSIS — J069 Acute upper respiratory infection, unspecified: Secondary | ICD-10-CM | POA: Insufficient documentation

## 2014-09-28 DIAGNOSIS — I2510 Atherosclerotic heart disease of native coronary artery without angina pectoris: Secondary | ICD-10-CM

## 2014-09-28 DIAGNOSIS — Z87891 Personal history of nicotine dependence: Secondary | ICD-10-CM | POA: Insufficient documentation

## 2014-09-28 DIAGNOSIS — I129 Hypertensive chronic kidney disease with stage 1 through stage 4 chronic kidney disease, or unspecified chronic kidney disease: Secondary | ICD-10-CM | POA: Insufficient documentation

## 2014-09-28 DIAGNOSIS — K529 Noninfective gastroenteritis and colitis, unspecified: Secondary | ICD-10-CM | POA: Diagnosis not present

## 2014-09-28 DIAGNOSIS — E785 Hyperlipidemia, unspecified: Secondary | ICD-10-CM | POA: Insufficient documentation

## 2014-09-28 DIAGNOSIS — I5042 Chronic combined systolic (congestive) and diastolic (congestive) heart failure: Secondary | ICD-10-CM | POA: Diagnosis not present

## 2014-09-28 DIAGNOSIS — E860 Dehydration: Secondary | ICD-10-CM

## 2014-09-28 DIAGNOSIS — Z7901 Long term (current) use of anticoagulants: Secondary | ICD-10-CM | POA: Diagnosis not present

## 2014-09-28 DIAGNOSIS — IMO0002 Reserved for concepts with insufficient information to code with codable children: Secondary | ICD-10-CM | POA: Diagnosis present

## 2014-09-28 DIAGNOSIS — E872 Acidosis: Secondary | ICD-10-CM | POA: Insufficient documentation

## 2014-09-28 DIAGNOSIS — Z951 Presence of aortocoronary bypass graft: Secondary | ICD-10-CM | POA: Insufficient documentation

## 2014-09-28 DIAGNOSIS — R112 Nausea with vomiting, unspecified: Secondary | ICD-10-CM

## 2014-09-28 DIAGNOSIS — I255 Ischemic cardiomyopathy: Secondary | ICD-10-CM | POA: Diagnosis not present

## 2014-09-28 DIAGNOSIS — Z992 Dependence on renal dialysis: Secondary | ICD-10-CM | POA: Insufficient documentation

## 2014-09-28 DIAGNOSIS — Z79899 Other long term (current) drug therapy: Secondary | ICD-10-CM

## 2014-09-28 DIAGNOSIS — Z95810 Presence of automatic (implantable) cardiac defibrillator: Secondary | ICD-10-CM

## 2014-09-28 DIAGNOSIS — I429 Cardiomyopathy, unspecified: Secondary | ICD-10-CM | POA: Diagnosis not present

## 2014-09-28 DIAGNOSIS — E119 Type 2 diabetes mellitus without complications: Secondary | ICD-10-CM

## 2014-09-28 DIAGNOSIS — I252 Old myocardial infarction: Secondary | ICD-10-CM | POA: Insufficient documentation

## 2014-09-28 DIAGNOSIS — I10 Essential (primary) hypertension: Secondary | ICD-10-CM | POA: Diagnosis present

## 2014-09-28 DIAGNOSIS — Z955 Presence of coronary angioplasty implant and graft: Secondary | ICD-10-CM

## 2014-09-28 DIAGNOSIS — R197 Diarrhea, unspecified: Secondary | ICD-10-CM

## 2014-09-28 HISTORY — DX: Noninfective gastroenteritis and colitis, unspecified: K52.9

## 2014-09-28 LAB — URINALYSIS, ROUTINE W REFLEX MICROSCOPIC
Bilirubin Urine: NEGATIVE
Glucose, UA: NEGATIVE mg/dL
Hgb urine dipstick: NEGATIVE
Ketones, ur: NEGATIVE mg/dL
Nitrite: NEGATIVE
Protein, ur: NEGATIVE mg/dL
Specific Gravity, Urine: 1.011 (ref 1.005–1.030)
Urobilinogen, UA: 0.2 mg/dL (ref 0.0–1.0)
pH: 5 (ref 5.0–8.0)

## 2014-09-28 LAB — PHOSPHORUS: Phosphorus: 5.2 mg/dL — ABNORMAL HIGH (ref 2.5–4.6)

## 2014-09-28 LAB — I-STAT CG4 LACTIC ACID, ED: LACTIC ACID, VENOUS: 2.67 mmol/L — AB (ref 0.5–2.0)

## 2014-09-28 LAB — CBC WITH DIFFERENTIAL/PLATELET
BASOS ABS: 0 10*3/uL (ref 0.0–0.1)
BASOS PCT: 0 % (ref 0–1)
Eosinophils Absolute: 0 10*3/uL (ref 0.0–0.7)
Eosinophils Relative: 0 % (ref 0–5)
HEMATOCRIT: 34.5 % — AB (ref 36.0–46.0)
HEMOGLOBIN: 11.5 g/dL — AB (ref 12.0–15.0)
LYMPHS ABS: 0.6 10*3/uL — AB (ref 0.7–4.0)
LYMPHS PCT: 6 % — AB (ref 12–46)
MCH: 30.6 pg (ref 26.0–34.0)
MCHC: 33.3 g/dL (ref 30.0–36.0)
MCV: 91.8 fL (ref 78.0–100.0)
MONO ABS: 0.2 10*3/uL (ref 0.1–1.0)
Monocytes Relative: 2 % — ABNORMAL LOW (ref 3–12)
NEUTROS ABS: 9.3 10*3/uL — AB (ref 1.7–7.7)
Neutrophils Relative %: 92 % — ABNORMAL HIGH (ref 43–77)
PLATELETS: 190 10*3/uL (ref 150–400)
RBC: 3.76 MIL/uL — ABNORMAL LOW (ref 3.87–5.11)
RDW: 14.3 % (ref 11.5–15.5)
WBC: 10.1 10*3/uL (ref 4.0–10.5)

## 2014-09-28 LAB — COMPREHENSIVE METABOLIC PANEL
ALK PHOS: 134 U/L — AB (ref 38–126)
ALT: 70 U/L — ABNORMAL HIGH (ref 14–54)
ANION GAP: 18 — AB (ref 5–15)
AST: 57 U/L — AB (ref 15–41)
Albumin: 4.1 g/dL (ref 3.5–5.0)
BUN: 70 mg/dL — AB (ref 6–20)
CO2: 20 mmol/L — ABNORMAL LOW (ref 22–32)
CREATININE: 2.72 mg/dL — AB (ref 0.44–1.00)
Calcium: 9.8 mg/dL (ref 8.9–10.3)
Chloride: 103 mmol/L (ref 101–111)
GFR calc Af Amer: 19 mL/min — ABNORMAL LOW (ref >60)
GFR, EST NON AFRICAN AMERICAN: 17 mL/min — AB (ref >60)
GLUCOSE: 317 mg/dL — AB (ref 65–99)
POTASSIUM: 4.7 mmol/L (ref 3.5–5.1)
Sodium: 141 mmol/L (ref 135–145)
Total Bilirubin: 1.3 mg/dL — ABNORMAL HIGH (ref 0.3–1.2)
Total Protein: 7.2 g/dL (ref 6.5–8.1)

## 2014-09-28 LAB — URINE MICROSCOPIC-ADD ON

## 2014-09-28 LAB — GLUCOSE, CAPILLARY
GLUCOSE-CAPILLARY: 369 mg/dL — AB (ref 65–99)
Glucose-Capillary: 408 mg/dL — ABNORMAL HIGH (ref 65–99)
Glucose-Capillary: 379 mg/dL — ABNORMAL HIGH (ref 65–99)

## 2014-09-28 LAB — LACTATE DEHYDROGENASE: LDH: 338 U/L — ABNORMAL HIGH (ref 98–192)

## 2014-09-28 LAB — LIPASE, BLOOD: Lipase: 34 U/L (ref 22–51)

## 2014-09-28 LAB — LACTIC ACID, PLASMA: LACTIC ACID, VENOUS: 4.3 mmol/L — AB (ref 0.5–2.0)

## 2014-09-28 MED ORDER — CARVEDILOL 3.125 MG PO TABS
3.1250 mg | ORAL_TABLET | Freq: Two times a day (BID) | ORAL | Status: DC
  Administered 2014-09-28: 3.125 mg via ORAL
  Filled 2014-09-28: qty 1

## 2014-09-28 MED ORDER — INSULIN ASPART 100 UNIT/ML ~~LOC~~ SOLN
0.0000 [IU] | Freq: Three times a day (TID) | SUBCUTANEOUS | Status: DC
  Administered 2014-09-29 (×2): 5 [IU] via SUBCUTANEOUS

## 2014-09-28 MED ORDER — SODIUM CHLORIDE 0.9 % IV BOLUS (SEPSIS)
1000.0000 mL | Freq: Once | INTRAVENOUS | Status: AC
  Administered 2014-09-28: 1000 mL via INTRAVENOUS

## 2014-09-28 MED ORDER — PROMETHAZINE HCL 25 MG PO TABS
12.5000 mg | ORAL_TABLET | Freq: Four times a day (QID) | ORAL | Status: DC | PRN
Start: 2014-09-28 — End: 2014-09-29

## 2014-09-28 MED ORDER — CIPROFLOXACIN IN D5W 400 MG/200ML IV SOLN
400.0000 mg | INTRAVENOUS | Status: DC
  Administered 2014-09-29: 400 mg via INTRAVENOUS
  Filled 2014-09-28: qty 200

## 2014-09-28 MED ORDER — INSULIN ASPART 100 UNIT/ML ~~LOC~~ SOLN
9.0000 [IU] | Freq: Once | SUBCUTANEOUS | Status: AC
  Administered 2014-09-28: 9 [IU] via SUBCUTANEOUS

## 2014-09-28 MED ORDER — ACETAMINOPHEN 325 MG PO TABS
650.0000 mg | ORAL_TABLET | Freq: Four times a day (QID) | ORAL | Status: DC | PRN
  Administered 2014-09-28: 650 mg via ORAL
  Filled 2014-09-28: qty 2

## 2014-09-28 MED ORDER — ONDANSETRON HCL 4 MG/2ML IJ SOLN
4.0000 mg | Freq: Once | INTRAMUSCULAR | Status: AC
  Administered 2014-09-28: 4 mg via INTRAVENOUS
  Filled 2014-09-28: qty 2

## 2014-09-28 MED ORDER — INSULIN ASPART 100 UNIT/ML ~~LOC~~ SOLN
0.0000 [IU] | Freq: Every day | SUBCUTANEOUS | Status: DC
  Administered 2014-09-28: 5 [IU] via SUBCUTANEOUS

## 2014-09-28 MED ORDER — CIPROFLOXACIN IN D5W 400 MG/200ML IV SOLN
400.0000 mg | Freq: Two times a day (BID) | INTRAVENOUS | Status: DC
  Administered 2014-09-28: 400 mg via INTRAVENOUS
  Filled 2014-09-28: qty 200

## 2014-09-28 MED ORDER — METRONIDAZOLE IN NACL 5-0.79 MG/ML-% IV SOLN
500.0000 mg | Freq: Three times a day (TID) | INTRAVENOUS | Status: DC
  Administered 2014-09-28 – 2014-09-29 (×4): 500 mg via INTRAVENOUS
  Filled 2014-09-28 (×4): qty 100

## 2014-09-28 MED ORDER — APIXABAN 5 MG PO TABS
5.0000 mg | ORAL_TABLET | Freq: Two times a day (BID) | ORAL | Status: DC
  Administered 2014-09-28 – 2014-09-29 (×2): 5 mg via ORAL
  Filled 2014-09-28 (×2): qty 1

## 2014-09-28 MED ORDER — PANTOPRAZOLE SODIUM 40 MG PO TBEC
40.0000 mg | DELAYED_RELEASE_TABLET | Freq: Every day | ORAL | Status: DC
  Administered 2014-09-28 – 2014-09-29 (×2): 40 mg via ORAL
  Filled 2014-09-28 (×2): qty 1

## 2014-09-28 MED ORDER — SODIUM CHLORIDE 0.9 % IV BOLUS (SEPSIS)
250.0000 mL | Freq: Once | INTRAVENOUS | Status: AC
  Administered 2014-09-28: 250 mL via INTRAVENOUS

## 2014-09-28 MED ORDER — SODIUM CHLORIDE 0.9 % IV SOLN
INTRAVENOUS | Status: AC
  Administered 2014-09-28: 10:00:00 via INTRAVENOUS

## 2014-09-28 MED ORDER — INSULIN ASPART 100 UNIT/ML ~~LOC~~ SOLN: 0.0000 [IU] | Freq: Three times a day (TID) | SUBCUTANEOUS | Status: DC

## 2014-09-28 MED ORDER — IOHEXOL 300 MG/ML  SOLN
100.0000 mL | Freq: Once | INTRAMUSCULAR | Status: AC | PRN
  Administered 2014-09-28: 100 mL via INTRAVENOUS

## 2014-09-28 MED ORDER — ATORVASTATIN CALCIUM 40 MG PO TABS
40.0000 mg | ORAL_TABLET | Freq: Every day | ORAL | Status: DC
  Administered 2014-09-28: 40 mg via ORAL
  Filled 2014-09-28: qty 1

## 2014-09-28 MED ORDER — NITROGLYCERIN 0.4 MG SL SUBL: 0.4000 mg | SUBLINGUAL_TABLET | SUBLINGUAL | Status: DC | PRN

## 2014-09-28 MED ORDER — ASPIRIN EC 81 MG PO TBEC
81.0000 mg | DELAYED_RELEASE_TABLET | Freq: Every day | ORAL | Status: DC
  Administered 2014-09-28 – 2014-09-29 (×2): 81 mg via ORAL
  Filled 2014-09-28 (×2): qty 1

## 2014-09-28 MED ORDER — ALLOPURINOL 100 MG PO TABS
50.0000 mg | ORAL_TABLET | Freq: Two times a day (BID) | ORAL | Status: DC
  Administered 2014-09-28 – 2014-09-29 (×2): 50 mg via ORAL
  Filled 2014-09-28 (×3): qty 1

## 2014-09-28 NOTE — Progress Notes (Signed)
Patient examined at bedside given increasing lactic acid in the setting of colitis. Very mildly hypotensive into the 85'U systolic, tachycardic of 314. Patient denies abdominal pain currently, no dizziness, lightheadedness, nausea, or vomiting. States she has not had a bowel movement since yesterday. Abdominal exam very benign, no tenderness, no rebound, bowel sounds present. Given increasing lactic acid in the setting of colitis and AG metabolic acidosis, some concern for ischemic colitis, however, no abdominal pain on exam as previously discussed. Of note, patient w/ severe combined CHF.  -Will give NS 250 cc bolus -Increase NS gtt to 100 cc/hr -Repeat lactic acid @ midnight -Continue Cipro/Flagyl -NPO  Natasha Bence, MD PGY-3, Internal Medicine Pager: [phone]

## 2014-09-28 NOTE — H&P (Signed)
Date: 09/28/2014               Patient Name:  Jo Lynn MRN: [id number]  DOB: 06/24/1945 Age / Sex: 69 y.o., female   PCP: Chevis Pretty, FNP         Medical Service: Internal Medicine Teaching Service         Attending Physician: Dr. Eppie Gibson    First Contact: Dr. Melburn Hake Pager: [phone]  Second Contact: Dr. Redmond Pulling Pager: [phone]       After Hours (After 5p/  First Contact Pager: [phone]  weekends / holidays): Second Contact Pager: [phone]   Chief Complaint: Diarrhea and Vomiting  History of Present Illness: 72 y o f with PMH of HTN, hx of STEMI s/p Stents, balloon angioplasty and CABG, uncontrolled DM, CKD- 4, Atria Fibrillation presented with complaints of diarrhea that started yesterday, she has 3-4 episodes since this started, last BM was early this morning, stools are non bloody, non mucoid and mostly runny.   She has also been having dry heaves without really vomiting, with reduced appetite. She also endorses some abdominal pain that she says feels like hunger pangs as she has not eaten any thing for the past day. She says thee pain is in the center of her stomach, dull and non-radiating, she has not had prior hx of abdominal pain with meals.  She denies fever, she occasionally has chills at home which she says is normal for her, she denies sick contacts- with diarrhea, she denies eating outside her home recently, or recent travel or recent use of antibiotics. She denies prior episodes of diarrhea, and was otherwise in her usual state of health before this started.  She was at the urgent care yesterday for 3 weeks of URTI- Cough, sneezing and runny nose and was given a prescription for hycodan without any antibiotics.  She presented to the ED, had a Ct scan abdomen done which showed colitis, she was given 1 L of N/s saline bolus, IMTS was asked to admit.  Meds: No current facility-administered medications for this encounter.   Current Outpatient Prescriptions   Medication Sig Dispense Refill  . allopurinol (ZYLOPRIM) 100 MG tablet Take 0.5 tablets (50 mg total) by mouth 2 (two) times daily.    Marland Kitchen apixaban (ELIQUIS) 5 MG TABS tablet Take 1 tablet (5 mg total) by mouth 2 (two) times daily. 180 tablet 0  . aspirin EC 81 MG tablet Take 1 tablet (81 mg total) by mouth daily.    Marland Kitchen atorvastatin (LIPITOR) 40 MG tablet Take 1 tablet (40 mg total) by mouth at bedtime. 90 tablet 0  . carvedilol (COREG) 3.125 MG tablet Take 1 tablet (3.125 mg total) by mouth 2 (two) times daily with a meal. 180 tablet 0  . clopidogrel (PLAVIX) 75 MG tablet Take 75 mg by mouth daily.    . fluticasone (FLONASE) 50 MCG/ACT nasal spray Place 2 sprays into both nostrils daily. 16 g 6  . furosemide (LASIX) 40 MG tablet Take 1 tablet (40 mg total) by mouth every other day. 45 tablet 3  . glimepiride (AMARYL) 2 MG tablet Take 1 tablet (2 mg total) by mouth daily with breakfast. 30 tablet 3  . HYDROcodone-homatropine (HYCODAN) 5-1.5 MG/5ML syrup Take 5 mLs by mouth every 6 (six) hours as needed for cough.     . lidocaine (LMX) 4 % cream Apply topically daily as needed (foot/leg pain). 30 g 3  . losartan (COZAAR) 50 MG tablet Take 50 mg by mouth  daily.    . nitroGLYCERIN (NITROSTAT) 0.4 MG SL tablet Place 1 tablet (0.4 mg total) under the tongue every 5 (five) minutes as needed for chest pain. 90 tablet 3  . pantoprazole (PROTONIX) 40 MG tablet Take 1 tablet (40 mg total) by mouth daily. 90 tablet 0  . sitaGLIPtin (JANUVIA) 100 MG tablet Take 1 tablet (100 mg total) by mouth daily. 90 tablet 1    Allergies: Allergies as of 09/28/2014 - Review Complete 09/28/2014  Allergen Reaction Noted  . Prednisone  07/12/2014   Past Medical History  Diagnosis Date  . Myocardial infarction 10/2002    MV CAD --> Referred for CABG  . 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 Lucianne Lei Trigt: LIMA-LAD, SVG-RPDA, SVG-OM1-OM2  . ST elevation myocardial infarction (STEMI) of inferolateral wall 2015, 2016    SVG-Om1-2 occluded - PCI in 2 locations; Occluded SVG-RCA & native RCA, LAD & native Cx occcluded.  EF ~20%.  SVG to OM with 99% stenosis treated with balloon angioplasty.  05/2024.   . Cardiomyopathy, ischemic 09/09/2013    EF ~20-25%; Large MI, existing occluded RCA & SVG-RCA; h/o CABG  . 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 CAD S/P percutaneous coronary angioplasty 09/09/2013    mid SVG-OM1-OM2 (prox LIMB) Rebel BMS 4.0 mm x 16 mm; sequential limb OM1-OM2: Rebel BMS 4.0 mm x 12 mm  . Combined systolic and diastolic congestive heart failure, NYHA class 3 09/09/2013    Echo post Inferolateral STEMI: Severely reduced LVEF ~20-35%; Akinesis of Inferolateral, Inferior & Inferoseptal walls; Grade 1 DDysfxn (initial LVEDP ~26 mmHg with SBP of 99 mHg)  . Essential hypertension 08/17/2012  . Hyperlipidemia with target LDL less than 70 08/17/2012  . Diabetes mellitus type 2 with complications 02/26/7508  . 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 Lovena Le   Past Surgical History  Procedure Laterality Date  . Coronary artery bypass graft    . Left heart catheterization with coronary angiogram N/A 09/09/2013    Procedure: LEFT HEART CATHETERIZATION WITH CORONARY ANGIOGRAM;  Surgeon: Leonie Man, MD;  Location: [facility] CATH LAB;  Service: Cardiovascular;  Laterality: N/A;  . Implantable cardioverter defibrillator implant  02-21-2014    STJ single chamber ICD implanted by Dr Lovena Le for primary prevention  . Implantable cardioverter defibrillator implant N/A 02/21/2014    Procedure: IMPLANTABLE CARDIOVERTER  DEFIBRILLATOR IMPLANT;  Surgeon: Evans Lance, MD;  Location: [facility] CATH LAB;  Service: Cardiovascular;  Laterality: N/A;  . Left heart catheterization with coronary angiogram N/A 06/13/2014    Procedure: LEFT HEART CATHETERIZATION WITH CORONARY ANGIOGRAM;  Surgeon: Jettie Booze, MD;  Location: [facility] CATH LAB;  Service: Cardiovascular;  Laterality: N/A;   Family History  Problem Relation Age of Onset  . Diabetes Mother   . Diabetes Father   . Heart attack Neg Hx   . Stroke Neg Hx    History   Social History  . Marital Status: Single    Spouse Name: N/A  . Number of Children: 4  . Years of Education: N/A  Occupational History  . Not on file.   Social History Main Topics  . Smoking status: Former Smoker    Types: Cigarettes  . Smokeless tobacco: Never Used  . Alcohol Use: No  . Drug Use: No  . Sexual Activity: Not on file   Other Topics Concern  . Not on file   Social History Narrative   Review of Systems: CONSTITUTIONAL- No Fever, or change in appetite. SKIN- No Rash, or itching. Mouth/throat- No Sorethroat, dentures, or bleeding gums. RESPIRATORY- No Cough or SOB. CARDIAC- No Palpitations, chest pain. URINARY- No Frequency, urgency, straining or dysuria. NEUROLOGIC- No syncope.  Physical Exam: Blood pressure 107/66, pulse 102, temperature 97.8 F (36.6 C), temperature source Oral, resp. rate 20, SpO2 100 %. GENERAL- alert, co-operative, appears as stated age, in mild distress form abdominal pain which she attributes to hunger pangs.  HEENT- Atraumatic, normocephalic, PERRL, oral mucosa appears mildly dry, moist  CARDIAC- No murmurs, rubs or gallops. RESP- Moving equal volumes of air, and clear to auscultation bilaterally, no wheezes or crackles. ABDOMEN- Soft, nontender, no guarding or rebound, no palpable masses or organomegaly, bowel sounds reduced. NEURO- No obvious Cr N abnormality. EXTREMITIES- Reduced distal pulses- right, warm, no pedal edema. SKIN-  Warm, dry skin, No rash PSYCH- Normal mood and affect, appropriate thought content and speech.  Lab results: Basic Metabolic Panel:  Recent Labs  09/28/14 0443  NA 141  K 4.7  CL 103  CO2 20*  GLUCOSE 317*  BUN 70*  CREATININE 2.72*  CALCIUM 9.8   Anion gap- 18  Baseline Cr- 1.3 to 1.5.  Liver Function Tests:  Recent Labs  09/28/14 0443  AST 57*  ALT 70*  ALKPHOS 134*  BILITOT 1.3*  PROT 7.2  ALBUMIN 4.1    Recent Labs  09/28/14 0443  LIPASE 34   CBC:  Recent Labs  09/28/14 0443  WBC 10.1  NEUTROABS 9.3*  HGB 11.5*  HCT 34.5*  MCV 91.8  PLT 190   Urinalysis:  Recent Labs  09/28/14 0605  COLORURINE YELLOW  LABSPEC 1.011  PHURINE 5.0  GLUCOSEU NEGATIVE  HGBUR NEGATIVE  BILIRUBINUR NEGATIVE  KETONESUR NEGATIVE  PROTEINUR NEGATIVE  UROBILINOGEN 0.2  NITRITE NEGATIVE  LEUKOCYTESUR SMALL*   Imaging results:  Ct Abdomen Pelvis Wo Contrast  09/28/2014   CLINICAL DATA:  Emesis and diarrhea, onset yesterday.  EXAM: CT ABDOMEN AND PELVIS WITHOUT CONTRAST  TECHNIQUE: Multidetector CT imaging of the abdomen and pelvis was performed following the standard protocol without IV contrast.  COMPARISON:  Ultrasound 04/18/2014  FINDINGS: There is marked mural thickening and pericolonic inflammatory change involving the ascending colon with appearances consistent with colitis or ischemia. There is no obstruction. There is no extraluminal air. The remainder of the colon is remarkable only for mild diverticulosis. Small bowel is unremarkable. Stomach is unremarkable.  There is massive enlargement of the uterus with numerous calcified fibroids. The uterus measures approximately 14 x 15 by 19 cm.  There is a 1.9 cm within the gallbladder lumen. There is no bile duct dilatation. There are unremarkable unenhanced appearances of the liver, pancreas, spleen and adrenals.  There is a 3 mm upper pole right collecting system calculus and a 2 mm lower pole right collecting system  calculus. There is a 2 mm lower pole left collecting system calculus. There is a 6 cm upper pole left renal cyst. No ureteral calculi are evident.  The abdominal aorta is normal in caliber. There is moderate atherosclerotic calcification. There is no  adenopathy in the abdomen or pelvis.  There is no significant skeletal lesion. Moderately severe degenerative disc changes are present in the lower lumbar spine and lumbosacral junction.  IMPRESSION: 1. Marked mural thickening and pericolonic inflammatory change involving the ascending colon, consistent with colitis or ischemia. No obstruction. No extraluminal air. 2. Diverticulosis 3. Cholelithiasis 4. Nephrolithiasis 5. Markedly enlarged uterus containing numerous calcified fibroid.   Electronically Signed   By: Andreas Newport M.D.   On: 09/28/2014 06:05    Other results: EKG: None  Assessment & Plan by Problem:  Colitis- As seen on Ct scan, most likely cause of patients Diarrhea, vomiting- dry heaves and possibly abdominal pain. Also possible viral gastroenteritis. But will treat as infectious colitis considering CT scan findings of pericolonic inflammatory change, WBC normal 10.1. Pt has significant hx of Coronary artery disease raising concern for possible bowel ischemia- but she has no hx of abdominal pain with meals, or bloody bowel movement. She has a slight lactic acidosis- 2.67, with anion gap- 18, and Acute on CKD- Cr- 2.72  which could be explained by dehydration.  - Admit to Telemetry - Gentle hydration with IVF- 50cc/hr of N/s for 12hrs. - Lactic acid recheck and anion gap, ensure down trending otherwise concern for ischemic bowel - IV ciprofloxacin 400mg  Q12H - IV metronidazole- 500mg  Q8H - Stool for C. Diff PCR - Clear liquid diet - On allorpurinol 50mg  BID started ~3 months ago, doubt related to diarrhea, will cont for now. - Will check LDH and Phosp.  Elevated liver enzymes- AST- 57, ALT- 70, ALP- 134, Bili-1.3. CT abdomen- Noted  cholelitiasis.  Last checked normal 05/2014 was off statin. Likely due to statin use, was held last admission due to transaminitis, but presently on pts med list now, and says he has been taking it. - CMET am.  - Acute hep panel if not down trending - Cont Statin, as liver enzymes are only mildly elevated.  Anion Gap metabolic acidosis- AG- 18, Bicarb- 20, with a delta delta ratio- >2, suggesting an additional metabolic alkalosis on going. Metabolic acidosis likely due to lactic acidosis, and alkalotic componet likely due to loss of bicabonate from diarrhea. - IVF  - Recheck am  Cardiomyopathy S/p ICD, CAD with hx of CABG, Stents and balloon angioplasty- Last Echo- Ef- 32-35%, systolic and grade 3 diastolic dysfunction. Last Coronary event- 05/2014- with PTCA angioplasty without stent placement. No sign of fluid overload on exam. Complaint with meds- Coreg 3.125mg  BID. Lorsatan 50mg  daily, lasix 40mg  Q48H, lipitor- 40mg  daily, aspirin 81mg  daily. Per last Cardiology note, patient to be on low dose aspirin, and eliquis, no mention of plavix. Was not on pts med list on last cardiology visit but was started last admission for CAD.  - Gentle hydration. - Cont Aspirin  - Hold Plavix for now.  Paroxysmal Atria Fib- Rate controlled. Successfully Cardioverted last admission in April 2016 which was in the setting of STEMI. On Eliquis and low dose Coreg. 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 Likely due to dehydration and on going lasix use and lorsatan use. BUN/Cr ratio- 25.7 suggesting prerenal etiology. UA suggestive of UTI but pt asymptomatic, Hyaline cast present only.  - Hold Lorsatan and lasix for now. - IVF. - Cmet am  Diabetes 2- Uncontrolled, Last HgbA1c on file- 05/2014- 13.7. meds- Sitagliptin 100mg  daily and Amaryl 2mg  daily, was better controlled prior to that- 5.8. Follow up with PCP as regards  more aggressive therapy. - SSI- s - Clear liq  diet  DVT ppx- On Eliquis.  Dispo: Disposition is deferred at this time, awaiting improvement of current medical problems. Anticipated discharge in approximately 1-2 day(s).   The patient does have a current PCP (Mary-Margaret Hassell Done, FNP) and does need an [facility] hospital follow-up appointment after discharge.  The patient does not know have transportation limitations that hinder transportation to clinic appointments.  Signed: Bethena Roys, MD 09/28/2014, 7:55 AM

## 2014-09-28 NOTE — ED Notes (Signed)
Pt. reports emesis and diarrhea onset yesterday with fatigue and generalized weakness , denies fever or chills, respirations unlabored / no abdominal pain .

## 2014-09-28 NOTE — ED Notes (Signed)
Pt ambulated to the bathroom.  

## 2014-09-28 NOTE — Progress Notes (Addendum)
Report received from [place], [place] for admission to [phone]. Report then given to Romie Minus, RN who will accept patient on unit

## 2014-09-28 NOTE — ED Provider Notes (Signed)
Patient signed out to me at shift change pending CT scan.  CT scan remarkable for colitis versus ischemia. Patient does not have any abdominal pain. Doubt ischemia. Will check lactic acid. If not markedly elevated, plan for admission for colitis, cataract, and dehydration. Will admit to hospitalist.  6:51 AM Patient reassessed by me.  States that she is not in pain, but is very hungry.  7:19 AM Patient discussed with internal medicine teaching service, who will admit the patient.  Montine Circle, PA-C 09/28/14 [id number]  Linton Flemings, MD 09/29/14 [phone]

## 2014-09-28 NOTE — Progress Notes (Signed)
ANTICOAGULATION CONSULT NOTE - Initial Consult  Pharmacy Consult for Apixaban Indication: atrial fibrillation  Allergies  Allergen Reactions  . Prednisone     "Did not feel good at all"    Patient Measurements:    Vital Signs: Temp: 97.8 F (36.6 C) (08/03 0418) Temp Source: Oral (08/03 0418) BP: 107/76 mmHg (08/03 1330) Pulse Rate: 94 (08/03 1330)  Labs:  Recent Labs  09/28/14 0443  HGB 11.5*  HCT 34.5*  PLT 190  CREATININE 2.72*    CrCl cannot be calculated (Unknown ideal weight.).   Medical History: Past Medical History  Diagnosis Date  . Myocardial infarction 10/2002    MV CAD --> Referred for CABG  . 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 Lucianne Lei Trigt: LIMA-LAD, SVG-RPDA, SVG-OM1-OM2  . ST elevation myocardial infarction (STEMI) of inferolateral wall 2015, 2016    SVG-Om1-2 occluded - PCI in 2 locations; Occluded SVG-RCA & native RCA, LAD & native Cx occcluded.  EF ~20%.  SVG to OM with 99% stenosis treated with balloon angioplasty.  05/2024.   . Cardiomyopathy, ischemic 09/09/2013    EF ~20-25%; Large MI, existing occluded RCA & SVG-RCA; h/o CABG  . 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 CAD S/P percutaneous coronary angioplasty 09/09/2013    mid SVG-OM1-OM2 (prox LIMB) Rebel BMS 4.0 mm x 16 mm; sequential limb OM1-OM2: Rebel BMS 4.0 mm x 12 mm  . Combined systolic and diastolic congestive heart failure, NYHA class 3 09/09/2013    Echo post Inferolateral STEMI: Severely reduced LVEF ~20-35%; Akinesis of Inferolateral, Inferior & Inferoseptal walls; Grade 1 DDysfxn (initial LVEDP ~26 mmHg with SBP of 99 mHg)   . Essential hypertension 08/17/2012  . Hyperlipidemia with target LDL less than 70 08/17/2012  . Diabetes mellitus type 2 with complications 9/56/3875  . 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 Lovena Le    Assessment: 69yo female on apixaban 5mg  BID PTA for Afib. Pt presented to ED today c/o diarrhea and vomiting since yesterday, admitted for colitis vs possible viral gastroenteritis. 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  No reports of unusual bleeding, H&H slightly low, PLT wnl. Of note, SCr > 2.5 or CrCl < 25 mL/min would normally transition to warfarin but expect SCr to return to baseline w/ resolution of AKI. Neither pt age nor wt would constitute lower dose apixaban.   Plan:  - Continue apixaban 5mg  BID (PTA dose) - Monitor for improvement in renal fxn - Monitor s/sx of bleeding - Pharmacy will sign off, will follow peripherally  Drucie Opitz, PharmD Clinical Pharmacist Pager: [phone] 09/28/2014 3:50 PM

## 2014-09-28 NOTE — Progress Notes (Signed)
16:50 CBG 408. Notified Dr. Eppie Gibson. New order: Give 9 units novolog.

## 2014-09-28 NOTE — ED Provider Notes (Signed)
CSN: [id number]     Arrival date & time 09/28/14  0409 History   First MD Initiated Contact with Patient 09/28/14 0411     Chief Complaint  Patient presents with  . Emesis  . Diarrhea     (Consider location/radiation/quality/duration/timing/severity/associated sxs/prior Treatment) HPI Comments: Patient is a 69 yo F PMHx significant for CHF, CAD, HTN, HLD, DM presenting to the ED for evaluation of acute onset nausea, non-bloody non-bilious emesis x 3-4, non-bloody diarrhea x 3-4 that began around 3:30PM yesterday after being seen at [facility] for a few days of cough and URI symptoms diagnosed with viral illness and sent home with hycodan. Patient denies any fevers, chills, CP, SOB, abdominal pain, or urinary symptoms. No abdominal surgical history. No recent antibiotic use.    Past Medical History  Diagnosis Date  . Myocardial infarction 10/2002    MV CAD --> Referred for CABG  . 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 Lucianne Lei Trigt: LIMA-LAD, SVG-RPDA, SVG-OM1-OM2  . ST elevation myocardial infarction (STEMI) of inferolateral wall 2015, 2016    SVG-Om1-2 occluded - PCI in 2 locations; Occluded SVG-RCA & native RCA, LAD & native Cx occcluded.  EF ~20%.  SVG to OM with 99% stenosis treated with balloon angioplasty.  05/2024.   . Cardiomyopathy, ischemic 09/09/2013    EF ~20-25%; Large MI, existing occluded RCA & SVG-RCA; h/o CABG  . 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 CAD S/P percutaneous coronary angioplasty 09/09/2013    mid SVG-OM1-OM2 (prox LIMB) Rebel BMS 4.0 mm x 16 mm; sequential limb OM1-OM2: Rebel BMS 4.0 mm x 12 mm  . Combined  systolic and diastolic congestive heart failure, NYHA class 3 09/09/2013    Echo post Inferolateral STEMI: Severely reduced LVEF ~20-35%; Akinesis of Inferolateral, Inferior & Inferoseptal walls; Grade 1 DDysfxn (initial LVEDP ~26 mmHg with SBP of 99 mHg)  . Essential hypertension 08/17/2012  . Hyperlipidemia with target LDL less than 70 08/17/2012  . Diabetes mellitus type 2 with complications 5/40/0867  . 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 Lovena Le   Past Surgical History  Procedure Laterality Date  . Coronary artery bypass graft    . Left heart catheterization with coronary angiogram N/A 09/09/2013    Procedure: LEFT HEART CATHETERIZATION WITH CORONARY ANGIOGRAM;  Surgeon: Leonie Man, MD;  Location: [facility] CATH LAB;  Service: Cardiovascular;  Laterality: N/A;  . Implantable cardioverter defibrillator implant  02-21-2014    STJ single chamber ICD implanted by Dr Lovena Le for primary prevention  . Implantable cardioverter defibrillator implant N/A 02/21/2014    Procedure: IMPLANTABLE CARDIOVERTER DEFIBRILLATOR IMPLANT;  Surgeon: Evans Lance, MD;  Location: [facility] CATH LAB;  Service: Cardiovascular;  Laterality: N/A;  . Left heart catheterization with coronary angiogram N/A 06/13/2014    Procedure: LEFT HEART CATHETERIZATION WITH CORONARY ANGIOGRAM;  Surgeon: Jettie Booze, MD;  Location: [facility] CATH LAB;  Service: Cardiovascular;  Laterality: N/A;   Family History  Problem Relation Age of Onset  . Diabetes Mother   . Diabetes Father   . Heart  attack Neg Hx   . Stroke Neg Hx    History  Substance Use Topics  . Smoking status: Former Smoker    Types: Cigarettes  . Smokeless tobacco: Never Used  . Alcohol Use: No   OB History    No data available     Review of Systems  Respiratory: Positive for cough. Negative for shortness of breath.   Cardiovascular: Negative for chest pain.  Gastrointestinal: Positive for nausea, vomiting and  diarrhea. Negative for abdominal pain, blood in stool and anal bleeding.  All other systems reviewed and are negative.     Allergies  Prednisone  Home Medications   Prior to Admission medications   Medication Sig Start Date End Date Taking? Authorizing Provider  allopurinol (ZYLOPRIM) 100 MG tablet Take 0.5 tablets (50 mg total) by mouth 2 (two) times daily. 06/16/14  Yes Ripudeep Krystal Eaton, MD  apixaban (ELIQUIS) 5 MG TABS tablet Take 1 tablet (5 mg total) by mouth 2 (two) times daily. 07/12/14  Yes Minus Breeding, MD  aspirin EC 81 MG tablet Take 1 tablet (81 mg total) by mouth daily. 08/03/14  Yes Minus Breeding, MD  atorvastatin (LIPITOR) 40 MG tablet Take 1 tablet (40 mg total) by mouth at bedtime. 07/12/14  Yes Minus Breeding, MD  carvedilol (COREG) 3.125 MG tablet Take 1 tablet (3.125 mg total) by mouth 2 (two) times daily with a meal. 07/12/14  Yes Minus Breeding, MD  clopidogrel (PLAVIX) 75 MG tablet Take 75 mg by mouth daily. 09/06/14  Yes Historical Provider, MD  fluticasone (FLONASE) 50 MCG/ACT nasal spray Place 2 sprays into both nostrils daily. 04/28/14  Yes Mary-Margaret Hassell Done, FNP  furosemide (LASIX) 40 MG tablet Take 1 tablet (40 mg total) by mouth every other day. 07/12/14  Yes Evans Lance, MD  glimepiride (AMARYL) 2 MG tablet Take 1 tablet (2 mg total) by mouth daily with breakfast. 06/16/14  Yes Ripudeep Krystal Eaton, MD  HYDROcodone-homatropine (HYCODAN) 5-1.5 MG/5ML syrup Take 5 mLs by mouth every 6 (six) hours as needed for cough.  09/27/14  Yes Historical Provider, MD  lidocaine (LMX) 4 % cream Apply topically daily as needed (foot/leg pain). 06/16/14  Yes Ripudeep Krystal Eaton, MD  losartan (COZAAR) 50 MG tablet Take 50 mg by mouth daily. 08/04/14  Yes Historical Provider, MD  nitroGLYCERIN (NITROSTAT) 0.4 MG SL tablet Place 1 tablet (0.4 mg total) under the tongue every 5 (five) minutes as needed for chest pain. 12/01/13  Yes Minus Breeding, MD  pantoprazole (PROTONIX) 40 MG tablet Take 1 tablet  (40 mg total) by mouth daily. 07/12/14  Yes Minus Breeding, MD  sitaGLIPtin (JANUVIA) 100 MG tablet Take 1 tablet (100 mg total) by mouth daily. 06/22/14  Yes Mary-Margaret Hassell Done, FNP   BP 110/70 mmHg  Pulse 99  Temp(Src) 97.8 F (36.6 C) (Oral)  Resp 25  SpO2 92% Physical Exam  Constitutional: She is oriented to person, place, and time. She appears well-developed and well-nourished. No distress.  HENT:  Head: Normocephalic and atraumatic.  Right Ear: External ear normal.  Left Ear: External ear normal.  Nose: Nose normal.  Mouth/Throat: Uvula is midline and oropharynx is clear and moist. Mucous membranes are dry.  Eyes: Conjunctivae are normal.  Neck: Normal range of motion. Neck supple.  No nuchal rigidity.   Cardiovascular: Normal rate, regular rhythm and normal heart sounds.   Pulmonary/Chest: Effort normal and breath sounds normal.  Abdominal: Soft. Bowel sounds are normal. She exhibits no distension. There is no  tenderness.  Musculoskeletal: Normal range of motion.  Neurological: She is alert and oriented to person, place, and time.  Skin: Skin is warm and dry. She is not diaphoretic.  Psychiatric: She has a normal mood and affect.  Nursing note and vitals reviewed.   ED Course  Procedures (including critical care time) Medications  ondansetron (ZOFRAN) injection 4 mg (4 mg Intravenous Given 09/28/14 0441)  sodium chloride 0.9 % bolus 1,000 mL (0 mLs Intravenous Stopped 09/28/14 0603)  iohexol (OMNIPAQUE) 300 MG/ML solution 100 mL (100 mLs Intravenous Contrast Given 09/28/14 0535)    Labs Review Labs Reviewed  COMPREHENSIVE METABOLIC PANEL - Abnormal; Notable for the following:    CO2 20 (*)    Glucose, Bld 317 (*)    BUN 70 (*)    Creatinine, Ser 2.72 (*)    AST 57 (*)    ALT 70 (*)    Alkaline Phosphatase 134 (*)    Total Bilirubin 1.3 (*)    GFR calc non Af Amer 17 (*)    GFR calc Af Amer 19 (*)    Anion gap 18 (*)    All other components within normal limits   CBC WITH DIFFERENTIAL/PLATELET - Abnormal; Notable for the following:    RBC 3.76 (*)    Hemoglobin 11.5 (*)    HCT 34.5 (*)    Neutrophils Relative % 92 (*)    Neutro Abs 9.3 (*)    Lymphocytes Relative 6 (*)    Lymphs Abs 0.6 (*)    Monocytes Relative 2 (*)    All other components within normal limits  URINE CULTURE  LIPASE, BLOOD  URINALYSIS, ROUTINE W REFLEX MICROSCOPIC (NOT AT [facility])    Imaging Review No results found.   EKG Interpretation None      MDM   Final diagnoses:  Nausea vomiting and diarrhea    Filed Vitals:   09/28/14 0500  BP: 110/70  Pulse: 99  Temp:   Resp: 25   Patient presenting with acute onset nausea, vomiting, diarrhea. No abdominal pain. Patient uncomfortable appearing on examination. Mucous members are dry. No abdominal tenderness or peritoneal signs. IV fluids, nausea medicine given patient declines any pain medication. CT abdomen pelvis ordered for history of emesis and diarrhea. Labs reviewed significant jump in BUN and creatinine, likely secondary to dehydration. Also noted elevation in LFTs. CT scan pending at time of shift change will sign out to Montine Circle, PA-C pending scan results with plan for likely admission.   Patient d/w with Dr. Sharol Given, agrees with plan.      Baron Sane, PA-C 09/28/14 2703  Linton Flemings, MD 09/28/14 [phone]

## 2014-09-28 NOTE — Progress Notes (Addendum)
I have reviewed this patient's CT scan, but I have not seen the patient.  The mild to moderate right sided colitis--which could be either ischemic or infectious--is not why this patient has lactic acidosis and worsening renal function.  It seems from the limited information that I have on this patient currently that she needs volume resuscitation, maybe transfer to ICU if her BP continues to drop.  I will not be able to see the patient anytime soon because of more pressing Level I trauma patients, and a surgical patient.  If the CT scan were more concerning I would obviously prioritize her examination more.  I examined the patient who has minimal to no abdominal pain.  She has no abdominal tenderness.  No peritonitis.  Excellent bowel sounds.  The gallstone seen on CT in not clinically pathological.  You may want to get the GI service involved for possible colonoscopy.  Kathryne Eriksson. Dahlia Bailiff, MD, [place] [phone] [facility] Surgery

## 2014-09-28 NOTE — ED Notes (Signed)
Patient transported to CT 

## 2014-09-28 NOTE — ED Notes (Signed)
Requested meal for pt.

## 2014-09-29 ENCOUNTER — Telehealth: Payer: Self-pay | Admitting: Cardiology

## 2014-09-29 DIAGNOSIS — I429 Cardiomyopathy, unspecified: Secondary | ICD-10-CM

## 2014-09-29 DIAGNOSIS — N184 Chronic kidney disease, stage 4 (severe): Secondary | ICD-10-CM

## 2014-09-29 DIAGNOSIS — N179 Acute kidney failure, unspecified: Secondary | ICD-10-CM | POA: Diagnosis not present

## 2014-09-29 DIAGNOSIS — E1122 Type 2 diabetes mellitus with diabetic chronic kidney disease: Secondary | ICD-10-CM

## 2014-09-29 DIAGNOSIS — K529 Noninfective gastroenteritis and colitis, unspecified: Secondary | ICD-10-CM | POA: Diagnosis not present

## 2014-09-29 LAB — COMPREHENSIVE METABOLIC PANEL
ALT: 95 U/L — ABNORMAL HIGH (ref 14–54)
ANION GAP: 12 (ref 5–15)
AST: 82 U/L — ABNORMAL HIGH (ref 15–41)
Albumin: 3.2 g/dL — ABNORMAL LOW (ref 3.5–5.0)
Alkaline Phosphatase: 108 U/L (ref 38–126)
BILIRUBIN TOTAL: 1 mg/dL (ref 0.3–1.2)
BUN: 67 mg/dL — AB (ref 6–20)
CO2: 20 mmol/L — ABNORMAL LOW (ref 22–32)
Calcium: 8.8 mg/dL — ABNORMAL LOW (ref 8.9–10.3)
Chloride: 106 mmol/L (ref 101–111)
Creatinine, Ser: 2.24 mg/dL — ABNORMAL HIGH (ref 0.44–1.00)
GFR calc Af Amer: 24 mL/min — ABNORMAL LOW (ref >60)
GFR calc non Af Amer: 21 mL/min — ABNORMAL LOW (ref >60)
Glucose, Bld: 233 mg/dL — ABNORMAL HIGH (ref 65–99)
Potassium: 4.1 mmol/L (ref 3.5–5.1)
Sodium: 138 mmol/L (ref 135–145)
Total Protein: 5.9 g/dL — ABNORMAL LOW (ref 6.5–8.1)

## 2014-09-29 LAB — GLUCOSE, CAPILLARY
GLUCOSE-CAPILLARY: 283 mg/dL — AB (ref 65–99)
Glucose-Capillary: 289 mg/dL — ABNORMAL HIGH (ref 65–99)

## 2014-09-29 LAB — URINE CULTURE

## 2014-09-29 LAB — LACTIC ACID, PLASMA: Lactic Acid, Venous: 2.4 mmol/L (ref 0.5–2.0)

## 2014-09-29 MED ORDER — METRONIDAZOLE 500 MG PO TABS: 500.0000 mg | ORAL_TABLET | Freq: Three times a day (TID) | ORAL | Status: AC

## 2014-09-29 MED ORDER — CIPROFLOXACIN HCL 500 MG PO TABS: 500.0000 mg | ORAL_TABLET | Freq: Two times a day (BID) | ORAL | Status: AC

## 2014-09-29 MED ORDER — SITAGLIPTIN PHOSPHATE 25 MG PO TABS: 25.0000 mg | ORAL_TABLET | Freq: Every day | ORAL | Status: DC

## 2014-09-29 NOTE — Progress Notes (Signed)
Inpatient Diabetes Program Recommendations  AACE/ADA: New Consensus Statement on Inpatient Glycemic Control (2013)  Target Ranges:  Prepandial:   less than 140 mg/dL      Peak postprandial:   less than 180 mg/dL (1-2 hours)      Critically ill patients:  140 - 180 mg/dL   Inpatient Diabetes Program Recommendations HgbA1C: order to assess prehospital glucose control  Add Lantus 10 units at HS and increase Novolog to moderate scale TIDHS. Thank you  Raoul Pitch BSN, RN,CDE Inpatient Diabetes Coordinator [phone] (team pager)

## 2014-09-29 NOTE — Progress Notes (Signed)
Subjective:    Currently, the patient feels well and wants to go home. She is complaining of hunger and unhappy with her clear liquid diet. She is frustrated because she feels that she presented to the ED with a cough and dehydration, but is being treated for another problem. Her daughter would like her to be discharged today because she will not be able to provide transport tomorrow.  Interval Events: - She became mildly hypotensive overnight with BPs in the high 09W systolic. She was asymptomatic from this and her baseline is 119-147W systolic. BP stabilized with a small NS bolus of 250cc and her IVF was increased to 100cc/hr. - She also had an acute rise in her lactate to 4.3 from 2.6 which downtrended on repeat test to 2.4. - LFTs were mildly elevated on admission and she experienced a serial increase in transaminase levels, but there was a decline in alk phos and TBili levels this AM.   Objective:    Vital Signs:   Temp:  [97.5 F (36.4 C)-97.9 F (36.6 C)] 97.9 F (36.6 C) (08/04 0544) Pulse Rate:  [94-104] 100 (08/04 0544) Resp:  [17-20] 18 (08/04 0544) BP: (98-107)/(52-76) 105/68 mmHg (08/04 0544) SpO2:  [96 %-100 %] 96 % (08/04 0544) Weight:  [89.631 kg (197 lb 9.6 oz)] 89.631 kg (197 lb 9.6 oz) (08/03 2054) Last BM Date: 09/28/14  Intake/Output:   Intake/Output Summary (Last 24 hours) at 09/29/14 1328 Last data filed at 09/29/14 1040  Gross per 24 hour  Intake 2631.25 ml  Output    150 ml  Net 2481.25 ml      Physical Exam: General: Vital signs reviewed and noted. Well-developed, in no acute distress; alert, appropriate and cooperative throughout examination.  Lungs:  Normal respiratory effort. Clear to auscultation BL without crackles or wheezes.  Heart: RRR. S1 and S2 normal without gallop, murmur, or rubs.  Abdomen:  BS normoactive. Soft, Nondistended, non-tender.  No masses or organomegaly.  Extremities: 1+ pretibial edema.     Labs:  Basic Metabolic  Panel:  Recent Labs Lab 09/28/14 0443 09/28/14 1809 09/29/14 0445  NA 141  --  138  K 4.7  --  4.1  CL 103  --  106  CO2 20*  --  20*  GLUCOSE 317*  --  233*  BUN 70*  --  67*  CREATININE 2.72*  --  2.24*  CALCIUM 9.8  --  8.8*  PHOS  --  5.2*  --     Liver Function Tests:  Recent Labs Lab 09/28/14 0443 09/29/14 0445  AST 57* 82*  ALT 70* 95*  ALKPHOS 134* 108  BILITOT 1.3* 1.0  PROT 7.2 5.9*  ALBUMIN 4.1 3.2*    Recent Labs Lab 09/28/14 0443  LIPASE 34   CBC:  Recent Labs Lab 09/28/14 0443  WBC 10.1  NEUTROABS 9.3*  HGB 11.5*  HCT 34.5*  MCV 91.8  PLT 190   CBG:  Recent Labs Lab 09/28/14 1650 09/28/14 1933 09/28/14 2059 09/29/14 0747 09/29/14 1158  GLUCAP 408* 369* 379* 23* 283*    Microbiology: Results for orders placed or performed during the hospital encounter of 09/28/14  Urine culture     Status: None   Collection Time: 09/28/14  6:05 AM  Result Value Ref Range Status   Specimen Description URINE, CLEAN CATCH  Final   Special Requests NONE  Final   Culture MULTIPLE SPECIES PRESENT, SUGGEST RECOLLECTION  Final   Report Status 09/29/2014 FINAL  Final  Imaging: Ct Abdomen Pelvis Wo Contrast  09/28/2014   CLINICAL DATA:  Emesis and diarrhea, onset yesterday.  EXAM: CT ABDOMEN AND PELVIS WITHOUT CONTRAST  TECHNIQUE: Multidetector CT imaging of the abdomen and pelvis was performed following the standard protocol without IV contrast.  COMPARISON:  Ultrasound 04/18/2014  FINDINGS: There is marked mural thickening and pericolonic inflammatory change involving the ascending colon with appearances consistent with colitis or ischemia. There is no obstruction. There is no extraluminal air. The remainder of the colon is remarkable only for mild diverticulosis. Small bowel is unremarkable. Stomach is unremarkable.  There is massive enlargement of the uterus with numerous calcified fibroids. The uterus measures approximately 14 x 15 by 19 cm.  There  is a 1.9 cm within the gallbladder lumen. There is no bile duct dilatation. There are unremarkable unenhanced appearances of the liver, pancreas, spleen and adrenals.  There is a 3 mm upper pole right collecting system calculus and a 2 mm lower pole right collecting system calculus. There is a 2 mm lower pole left collecting system calculus. There is a 6 cm upper pole left renal cyst. No ureteral calculi are evident.  The abdominal aorta is normal in caliber. There is moderate atherosclerotic calcification. There is no adenopathy in the abdomen or pelvis.  There is no significant skeletal lesion. Moderately severe degenerative disc changes are present in the lower lumbar spine and lumbosacral junction.  IMPRESSION: 1. Marked mural thickening and pericolonic inflammatory change involving the ascending colon, consistent with colitis or ischemia. No obstruction. No extraluminal air. 2. Diverticulosis 3. Cholelithiasis 4. Nephrolithiasis 5. Markedly enlarged uterus containing numerous calcified fibroid.   Electronically Signed   By: Andreas Newport M.D.   On: 09/28/2014 06:05     Medications:    Infusions:    Scheduled Medications: . allopurinol  50 mg Oral BID  . apixaban  5 mg Oral BID  . aspirin EC  81 mg Oral Daily  . atorvastatin  40 mg Oral QHS  . ciprofloxacin  400 mg Intravenous Q24H  . insulin aspart  0-5 Units Subcutaneous QHS  . insulin aspart  0-9 Units Subcutaneous TID WC  . metronidazole  500 mg Intravenous Q8H  . pantoprazole  40 mg Oral Daily    PRN Medications: acetaminophen, nitroGLYCERIN, promethazine   Assessment/ Plan:    Pt is a 69 y.o. yo female with a PMHx of HTN, CAD (h/o STEMI s/p bare metal stents, CABG and recent balloon angioplasty in 05/2014), uncontrolled t2DM, CKD- 4, and A-Fib who was admitted on 09/28/2014 with symptoms of diarrhea and dehydration, which was determined to be secondary to colitis. Interventions at this time will be focused on gentle fluid  resuscitation and antibiotic therapy.   Colitis - As seen on CT, most likely cause of symptoms. C/o diarrhea, dry heave-vomiting, ? abdominal pain, she describes as "hunger pangs." Bacterial vs viral, patient high risk with significant comorbidities, will treat as bacterial empirically. WBC normal 10.1. H/o CAD concern for possible bowel ischemia. No h/o post-prandial abdominal pain or bloody bowel movement. Lactic acidosis of 2.67 --> 4.6 --> 2.4 in the setting of anion gap @ 18; may be explained by acute on CKD (Cr- 2.72) 2/2 dehydration. - Continue telemetry - Continue gentle hydration with IVF- 500cc NS @ 100cc/hr - Transition to PO ciprofloxacin 43m q12h and PO metronidazole [id number] - f/u C. Diff PCR - Trial of regular diet, if tolerating PO d/c home today  Elevated liver enzymes - AST 57 -->  82, ALT 70 --> 95, ALP 134 --> 108, TBili 1.3 --> 1.0. Cholelithiasis on CT. Previously elevated at last admission and attributed to statin use, which was held at DC. Normal LFTs off statin in 05/2014.  - Cont statin, as liver enzymes are only mildly elevated in setting of acute GI infection - Recheck outpt when acute illness resolved  Anion Gap metabolic acidosis- AG resolved, now 12, Bicarb 20. Likely 2/2 lactic acidosis, possible contribution of non-AG acidosis 2/2 GI bicarb loss. - IVF as above.  Acute on CKD4 - Cr 2.67 --> 2.24.  BUN/Cr ratio of 25.7 suggesting prerenal etiology. Likely 2/2 dehydration with home Lasix use. Patient reports she is NOT taking losartan, though it is listed on her medication list. Last Cardiology note indicates that she is not currently on ACE-I / ARB. UA w/ mild Leuks, pt currently asymptomatic. Hyaline casts only in sediment. - Hold Lasix. - Gentle IVF as above.  Cardiomyopathy s/p ICD and CAD with hx of CABG, bare metal stents and balloon angioplasty - Stable. No CP. Mild pedal edema (1+) on exam, lungs clear. Last Echo: LVEF 59-74%, systolic and grade 3  diastolic dysfunction. Last coronary event: 05/2014 with PTCA angioplasty w/o stent placement. Currently on ASA antiplatelet therapy. Med list includes Plavix, but last cardiology note from Dr. Percival Spanish indicates this was d/c'd in June 2016. - Continue home meds: Coreg 3.135m BID, Lipitor 458mqD, ASA 8128mD - Hold Plavix, with DC on ASA anti-platelet therapy alone and plan for f/u with Dr. HocPercival SpanishGentle hydration, monitor for CHF decompensation  Paroxysmal A-Fib - Stable. Rate controlled. Successfully cardioverted last admission (05/2014) in the setting of STEMI. - Cont Eliquis per pharmacy for renal dosing - Continue home Coreg  t2DM - Uncontrolled, Last HgbA1c 13.7 (05/2014). Home meds: Sitagliptin 100m32m and Amaryl 2mg 34m H/o better control in the past at 5.8. - SSI - Follow up with PCP for more aggressive outpt therapy   DVT PPX - Eliquis  CODE STATUS - Full  CONSULTS PLACED - Surgery  DISPO - Pactient may be discharged to home today with oral abx therapy if able to tolerate PO nutrition/hydration.  Anticipated discharge in approximately 0 day(s).   The patient does have a current PCP (MARTIChevis Pretty) [place] [facility].    [facility] a one-time only appointment? no.  Does the patient have transportation limitations that hinder transportation to clinic appointments? yes   [place]    Y = Yes, Blank = No PT:   OT:   RN:   Equipment:   Other:      Length of Stay:  day(s)   This is a MedicCareers information officer.  The care of the patient was discussed with Dr. WilsoRedmond Pullingthe assessment and plan formulated with their assistance.  Please see their attached note for official documentation of the daily encounter.  BryanEffie Berkshire Pager: [phone]) 09/29/2014, 1:28 PM

## 2014-09-29 NOTE — H&P (Signed)
Internal Medicine Attending Admission Note Date: 09/29/2014  Patient name: Rolene Andrades Medical record number: [id number] Date of birth: 12-Oct-1945 Age: 69 y.o. Gender: female  I saw and evaluated the patient. I reviewed the resident's note and I agree with the resident's findings and plan as documented in the resident's note.  Chief Complaint(s): Diarrhea and nausea with mild vomiting 1 day after 2 weeks of an upper respiratory tract infection.  History - key components related to admission:  Ms. Cutler is a 69 year old woman with a history of hypertension, hyperlipidemia, diabetes, stage IV chronic kidney disease, and coronary artery disease complicated by an ischemic cardiomyopathy and requiring previous intervention including percutaneous coronary intervention as well as coronary artery bypass grafting who presents with a one-day history of watery diarrhea and nausea with occasional vomiting. This was associated with a decrease in appetite over the same period of time and mild abdominal pain not associated with meals. She has recently had a productive cough over the last 3 weeks but denies any fevers, shakes, chest pain, or shortness of breath. She presented to the emergency department and underwent a CT scan of the abdomen which demonstrated a colitis. She was therefore admitted to the internal medicine teaching service for further evaluation and care.  When seen on rounds this morning she was upset that her true complaint of an upper respiratory tract infection was not addressed. She was not concerned about the nausea, diarrhea and mild abdominal pain. In fact, she was very upset with [place] because she has only been given clear liquids since being admitted to the hospital and was quite hungry and ready to go. This morning she denied any abdominal pain has had no further episodes of diarrhea. She is without other acute complaints, specifically noting that her cough has actually improved.  Physical  Exam - key components related to admission:  Filed Vitals:   09/28/14 2054 09/28/14 2054 09/29/14 0544 09/29/14 1418  BP:   105/68 90/64  Pulse:   100 98  Temp:  97.5 F (36.4 C) 97.9 F (36.6 C) 97.7 F (36.5 C)  TempSrc:  Oral Oral Oral  Resp:  17 18 16   Height: 5\' 7"  (1.702 m)     Weight:  197 lb 9.6 oz (89.631 kg)    SpO2:  100% 96% 100%   Gen.: Well-developed, well-nourished, woman lying comfortably in bed in no acute distress. Abdomen: Soft, nontender, active bowel sounds without rebound.  Lab results:  Basic Metabolic Panel:  Recent Labs  09/28/14 0443 09/28/14 1809 09/29/14 0445  NA 141  --  138  K 4.7  --  4.1  CL 103  --  106  CO2 20*  --  20*  GLUCOSE 317*  --  233*  BUN 70*  --  67*  CREATININE 2.72*  --  2.24*  CALCIUM 9.8  --  8.8*  PHOS  --  5.2*  --    Liver Function Tests:  Recent Labs  09/28/14 0443 09/29/14 0445  AST 57* 82*  ALT 70* 95*  ALKPHOS 134* 108  BILITOT 1.3* 1.0  PROT 7.2 5.9*  ALBUMIN 4.1 3.2*    Recent Labs  09/28/14 0443  LIPASE 34   CBC:  Recent Labs  09/28/14 0443  WBC 10.1  NEUTROABS 9.3*  HGB 11.5*  HCT 34.5*  MCV 91.8  PLT 190   CBG:  Recent Labs  09/28/14 1650 09/28/14 1933 09/28/14 2059 09/29/14 0747 09/29/14 1158  GLUCAP 408* 369* 379* 289* 283*  Urinalysis:  Cloudy, specific gravity 1.011, pH 5.0, negative nitrite, small leukocytes, 7-10 white blood cells per high-power field, 0-2 red blood cells per high-power field.  Misc. Labs:  Urine culture: Numerous PCs recollection suggested Lactic acid 2.67-> 4.3-> 2.4 LDH 338  Imaging results:  Ct Abdomen Pelvis Wo Contrast  09/28/2014   CLINICAL DATA:  Emesis and diarrhea, onset yesterday.  EXAM: CT ABDOMEN AND PELVIS WITHOUT CONTRAST  TECHNIQUE: Multidetector CT imaging of the abdomen and pelvis was performed following the standard protocol without IV contrast.  COMPARISON:  Ultrasound 04/18/2014  FINDINGS: There is marked mural thickening  and pericolonic inflammatory change involving the ascending colon with appearances consistent with colitis or ischemia. There is no obstruction. There is no extraluminal air. The remainder of the colon is remarkable only for mild diverticulosis. Small bowel is unremarkable. Stomach is unremarkable.  There is massive enlargement of the uterus with numerous calcified fibroids. The uterus measures approximately 14 x 15 by 19 cm.  There is a 1.9 cm within the gallbladder lumen. There is no bile duct dilatation. There are unremarkable unenhanced appearances of the liver, pancreas, spleen and adrenals.  There is a 3 mm upper pole right collecting system calculus and a 2 mm lower pole right collecting system calculus. There is a 2 mm lower pole left collecting system calculus. There is a 6 cm upper pole left renal cyst. No ureteral calculi are evident.  The abdominal aorta is normal in caliber. There is moderate atherosclerotic calcification. There is no adenopathy in the abdomen or pelvis.  There is no significant skeletal lesion. Moderately severe degenerative disc changes are present in the lower lumbar spine and lumbosacral junction.  IMPRESSION: 1. Marked mural thickening and pericolonic inflammatory change involving the ascending colon, consistent with colitis or ischemia. No obstruction. No extraluminal air. 2. Diverticulosis 3. Cholelithiasis 4. Nephrolithiasis 5. Markedly enlarged uterus containing numerous calcified fibroid.   Electronically Signed   By: Andreas Newport M.D.   On: 09/28/2014 06:05   Assessment & Plan by Problem:  Ms. Carbonell is a 69 year old woman who presents with a one-day history of nausea, mild vomiting, and diarrhea after a three-week bout of an upper respiratory infection. She was found to have a colitis on CT scan of the abdomen. It is likely she has an infectious colitis either bacterial or viral. Given her multiple core morbidities and concern for a bacterial colitis she was  placed on antibiotics and has responded well clinically with resolution of her diarrhea and abdominal pain. Her appetite has returned.  1) Colitis: Possibly bacterial. She's been converted to oral ciprofloxacin and Flagyl and will complete a 5 day course. If she tolerates a full diet well for lunch she will be discharged early this afternoon with follow-up in her primary care provider's office.

## 2014-09-29 NOTE — Progress Notes (Signed)
Patient discharge teaching given, including activity, diet, follow-up appoints, and medications. Patient verbalized understanding of all discharge instructions. IV access was d/c'd. Vitals are stable. Skin is intact except as charted in most recent assessments. Pt to be escorted out by NT, to be driven home by family. 

## 2014-09-29 NOTE — Discharge Summary (Signed)
Name: Jo Lynn MRN: [id number] DOB: Jul 09, 1945 69 y.o. PCP: Chevis Pretty, FNP  Date of Admission: 09/28/2014  4:10 AM Date of Discharge: 09/29/2014 Attending Physician: Oval Linsey, MD  Discharge Diagnosis: 1. Colitis 2. AKI on CKD 3. Cardiomyopathy 4. DM type 2   Principal Problem: Infectious Colitis  Discharge Medications:   Medication List    STOP taking these medications        clopidogrel 75 MG tablet  Commonly known as:  PLAVIX     losartan 50 MG tablet  Commonly known as:  COZAAR      TAKE these medications        allopurinol 100 MG tablet  Commonly known as:  ZYLOPRIM  Take 0.5 tablets (50 mg total) by mouth 2 (two) times daily.     apixaban 5 MG Tabs tablet  Commonly known as:  ELIQUIS  Take 1 tablet (5 mg total) by mouth 2 (two) times daily.     aspirin EC 81 MG tablet  Take 1 tablet (81 mg total) by mouth daily.     atorvastatin 40 MG tablet  Commonly known as:  LIPITOR  Take 1 tablet (40 mg total) by mouth at bedtime.     carvedilol 3.125 MG tablet  Commonly known as:  COREG  Take 1 tablet (3.125 mg total) by mouth 2 (two) times daily with a meal.     ciprofloxacin 500 MG tablet  Commonly known as:  CIPRO  Take 1 tablet (500 mg total) by mouth 2 (two) times daily.     fluticasone 50 MCG/ACT nasal spray  Commonly known as:  FLONASE  Place 2 sprays into both nostrils daily.     furosemide 40 MG tablet  Commonly known as:  LASIX  Take 1 tablet (40 mg total) by mouth every other day.     glimepiride 2 MG tablet  Commonly known as:  AMARYL  Take 1 tablet (2 mg total) by mouth daily with breakfast.     HYDROcodone-homatropine 5-1.5 MG/5ML syrup  Commonly known as:  HYCODAN  Take 5 mLs by mouth every 6 (six) hours as needed for cough.     lidocaine 4 % cream  Commonly known as:  LMX  Apply topically daily as needed (foot/leg pain).     metroNIDAZOLE 500 MG tablet  Commonly known as:  FLAGYL  Take 1 tablet (500 mg total)  by mouth 3 (three) times daily.     nitroGLYCERIN 0.4 MG SL tablet  Commonly known as:  NITROSTAT  Place 1 tablet (0.4 mg total) under the tongue every 5 (five) minutes as needed for chest pain.     pantoprazole 40 MG tablet  Commonly known as:  PROTONIX  Take 1 tablet (40 mg total) by mouth daily.     sitaGLIPtin 25 MG tablet  Commonly known as:  JANUVIA  Take 1 tablet (25 mg total) by mouth daily.        Disposition and follow-up:   Jo Lynn was discharged from [facility] in Good condition.  At the hospital follow up visit please address:  1.  Acute on CKD secondary to dehydration in the setting of CHF, please address Lasix regimen and ARB  2.  Labs / imaging needed at time of follow-up: SCr, K  3.  Pending labs/ test needing follow-up: None  Follow-up Appointments: Follow-up Information    Follow up with Rosaria Ferries, PA-C On 10/07/2014.   Specialties:  Cardiology, Radiology   Why:  @  11:30 AM; For hospital follow-up and management of diuretic therapy.   Contact information:   [address] [place] [address] [phone]       Follow up with Chevis Pretty, FNP In 1 week.   Specialty:  Nurse Practitioner   Contact information:   [address] [place] [address] [phone]       Discharge Instructions:  You were hospitalized in order to treat you for an infection of your colon and to give you intravenous fluids for dehydration. We have started you on two antibiotics, Ciprofloxacin and Metronidazole which you will continue for 3 days ending on the August 7th. Take your Ciprofloxacin medication 1 pill, two times every day. Take your Metronidazole medication 1 pill, three times every day. You should begin your first dose of each medication tonight, the day of your discharge.  You were dehydrated during your admission which may be the result of your recent upper respiratory infection and diarrheal illness  combined with your Lasix fluid pill and decreased water consumption. You have received intravenous fluids in order to help rehydrate you and restore your kidney function. We want you to continue to drink fluids at home, and attempt to stay hydrated with ~2 liters of water each day. You should continue to take your Lasix fluid pill every other day until you see your Cardiologist next week.  We have decreased the dose of your Januvia diabetes medication from 132m to 231mdaily, because of your reduced kidney function. Please address your diabetes medications with your doctor at your follow up visit.  Consultations:  Surgery  Procedures Performed:  Ct Abdomen Pelvis Wo Contrast  09/28/2014   CLINICAL DATA:  Emesis and diarrhea, onset yesterday.  EXAM: CT ABDOMEN AND PELVIS WITHOUT CONTRAST  TECHNIQUE: Multidetector CT imaging of the abdomen and pelvis was performed following the standard protocol without IV contrast.  COMPARISON:  Ultrasound 04/18/2014  FINDINGS: There is marked mural thickening and pericolonic inflammatory change involving the ascending colon with appearances consistent with colitis or ischemia. There is no obstruction. There is no extraluminal air. The remainder of the colon is remarkable only for mild diverticulosis. Small bowel is unremarkable. Stomach is unremarkable.  There is massive enlargement of the uterus with numerous calcified fibroids. The uterus measures approximately 14 x 15 by 19 cm.  There is a 1.9 cm within the gallbladder lumen. There is no bile duct dilatation. There are unremarkable unenhanced appearances of the liver, pancreas, spleen and adrenals.  There is a 3 mm upper pole right collecting system calculus and a 2 mm lower pole right collecting system calculus. There is a 2 mm lower pole left collecting system calculus. There is a 6 cm upper pole left renal cyst. No ureteral calculi are evident.  The abdominal aorta is normal in caliber. There is moderate  atherosclerotic calcification. There is no adenopathy in the abdomen or pelvis.  There is no significant skeletal lesion. Moderately severe degenerative disc changes are present in the lower lumbar spine and lumbosacral junction.  IMPRESSION: 1. Marked mural thickening and pericolonic inflammatory change involving the ascending colon, consistent with colitis or ischemia. No obstruction. No extraluminal air. 2. Diverticulosis 3. Cholelithiasis 4. Nephrolithiasis 5. Markedly enlarged uterus containing numerous calcified fibroid.   Electronically Signed   By: DaAndreas Newport.D.   On: 09/28/2014 06:05   Admission HPI: 69y.o. F with PMH of HTN, hx of STEMI s/p Stents, balloon angioplasty and CABG, uncontrolled DM, CKD- 4, Atria  Fibrillation presented with complaints of diarrhea that started yesterday, she has 3-4 episodes since this started, last BM was early this morning, stools are non bloody, non mucoid and mostly runny.   She has also been having dry heaves without really vomiting, with reduced appetite. She also endorses some abdominal pain that she says feels like hunger pangs as she has not eaten any thing for the past day. She says thee pain is in the center of her stomach, dull and non-radiating, she has not had prior hx of abdominal pain with meals.  She denies fever, she occasionally has chills at home which she says is normal for her, she denies sick contacts- with diarrhea, she denies eating outside her home recently, or recent travel or recent use of antibiotics. She denies prior episodes of diarrhea, and was otherwise in her usual state of health before this started.  She was at the urgent care yesterday for 3 weeks of URTI- Cough, sneezing and runny nose and was given a prescription for hycodan without any antibiotics.  She presented to the ED, had a Ct scan abdomen done which showed colitis, she was given 1L of NS saline bolus, IMTS was asked to admit.  Hospital Course by problem list:    1. Colitis: Jo Lynn presented to the ED after 1 day history of diarrhea and a 3 week history of URI symptoms. She was sent for abdominal CT and diagnosed with colitis, which was thought to be infectious in nature. CT also demonstrated non-obstructing cholelithiasis and nephrolithiasis, and a calcified uterine fibroid. Her complaints were primarily 3-4 episodes of non-bloody, non-mucoid diarrhea and several episodes of dry heave-vomiting. It was uncertain if she was experiencing abdominal pain, but she complained of significant discomfort which she described as "hunger pangs." The differential included bacterial vs viral colitis, but given the patient's high risk and significant comorbidities, she was treated as having a bacterial infection and given IV antibiotic therapy empirically. She was started on IV Ciprofloxacin and IV Metronidazole. Her initial WBC was normal at 10.1. She had a mild lactic acidosis of 2.67 on admission which trended to 4.6 overnight of her first hospital day, and given her complicated history of CAD, there was concern for possible bowel ischemia.  However, she did not complain of post-prandial abdominal pain or bloody bowel movements. Surgery was consulted and did not feel she had a surgical problem.   She remained asymptomatic for any bowel ischemia, and her lactic acid trended down to 2.4 following resuscitation with IV fluids. Her resolving lactic acidosis resulted in a pure anion gap metabolic acidosis (AG of 18) which also resolved following gentle IV fluid therapy. Her diarrhea resolved and she was feeling well, able to tolerate a regular diet and take PO fluids and medications on HD#1. She was discharged to home in good condition with oral antibiotics, cipro and flagyl, to complete a five day course.  She will follow-up with PCP after discharge.  2. Acute on Chronic Stage IV Kidney Disease: On arrival at the ED, Jo Lynn was found to have acute worsening of her renal  function with a increase in SCr to 2.72 from her baseline of 1.3-1.5. This was thought to be primarily due to dehydration in the setting of recent URI and diarrheal illness, exacerbated by poor oral hydration and continued Lasix therapy. She developed an AG metabolic acidosis which was thought to be due to a transient spike in lactic acid. Her anion gap closed with IV hydration and her  lactic acid normalized by the time of discharge. Her SCr was trending back toward her baseline at the time of discharge. She was advised to continue oral hydration to ~2 L at home, and to watch for signs of dehydration and fluid overload in order to titrate her home Lasix medications appropriately. She will be discharged on her 57m Lasix every other day with close follow-up for her Cardiologist to adjust her diuretic regimen appropriately.   3. Cardiomyopathy s/p ICD and CAD with hx of CABG, bare metal stents and balloon angioplasty: She was asymptomatic.  Her med list includes Plavix, but last cardiology note from Dr. HPercival Spanishindicates this was Plavix was discontinued in June 2016 and the patient has not been taking Plavix, only ASA.  Plavix has been removed from her med list.  Unclear if she was on Losartan before admission (it was discontinued at last admission due to AKI) and it is not on med list at recent cardiology office visit.  Furthermore, cardiology office note questions if her BP would "tolerate med titration or addition of an ACE inhibitor."  So unclear if she was on losartan.  Will continue to hold for now given Cr significantly above baseline (but downtrending).  Will defer to PCP or cardiology (visits scheduled for next week) to resume.  4. Diabetes mellitus, uncontrolled:  A1c 13.7.  She is on Januvia and Amaryl only.  Januvia was decreased from 1060mto 2574mt discharge because of low eGFR.  Amaryl was continued given her poor control, however, glipizide would be a better option given her CKD.  With her A1c,  will likely need insulin, at least short term.  Will defer to PCP for further management of DM.  Discharge Vitals:   BP 90/64 mmHg  Pulse 98  Temp(Src) 97.7 F (36.5 C) (Oral)  Resp 16  Ht _0  (1.702 m)  Wt 89.631 kg (197 lb 9.6 oz)  BMI 30.94 kg/m2  SpO2 100%  Discharge Labs:  Results for orders placed or performed during the hospital encounter of 09/28/14 (from the past 24 hour(s))  Glucose, capillary     Status: Abnormal   Collection Time: 09/28/14  4:50 PM  Result Value Ref Range   Glucose-Capillary 408 (H) 65 - 99 mg/dL  Lactic acid, plasma     Status: Abnormal   Collection Time: 09/28/14  6:09 PM  Result Value Ref Range   Lactic Acid, Venous 4.3 (HH) 0.5 - 2.0 mmol/L  Lactate dehydrogenase     Status: Abnormal   Collection Time: 09/28/14  6:09 PM  Result Value Ref Range   LDH 338 (H) 98 - 192 U/L  Phosphorus     Status: Abnormal   Collection Time: 09/28/14  6:09 PM  Result Value Ref Range   Phosphorus 5.2 (H) 2.5 - 4.6 mg/dL  Glucose, capillary     Status: Abnormal   Collection Time: 09/28/14  7:33 PM  Result Value Ref Range   Glucose-Capillary 369 (H) 65 - 99 mg/dL   Comment 1 Notify RN    Comment 2 Document in Chart   Glucose, capillary     Status: Abnormal   Collection Time: 09/28/14  8:59 PM  Result Value Ref Range   Glucose-Capillary 379 (H) 65 - 99 mg/dL   Comment 1 Notify RN    Comment 2 Document in Chart   Lactic acid, plasma     Status: Abnormal   Collection Time: 09/28/14 11:45 PM  Result Value Ref Range   Lactic Acid,  Venous 2.4 (HH) 0.5 - 2.0 mmol/L  Comprehensive metabolic panel     Status: Abnormal   Collection Time: 09/29/14  4:45 AM  Result Value Ref Range   Sodium 138 135 - 145 mmol/L   Potassium 4.1 3.5 - 5.1 mmol/L   Chloride 106 101 - 111 mmol/L   CO2 20 (L) 22 - 32 mmol/L   Glucose, Bld 233 (H) 65 - 99 mg/dL   BUN 67 (H) 6 - 20 mg/dL   Creatinine, Ser 2.24 (H) 0.44 - 1.00 mg/dL   Calcium 8.8 (L) 8.9 - 10.3 mg/dL   Total Protein  5.9 (L) 6.5 - 8.1 g/dL   Albumin 3.2 (L) 3.5 - 5.0 g/dL   AST 82 (H) 15 - 41 U/L   ALT 95 (H) 14 - 54 U/L   Alkaline Phosphatase 108 38 - 126 U/L   Total Bilirubin 1.0 0.3 - 1.2 mg/dL   GFR calc non Af Amer 21 (L) >60 mL/min   GFR calc Af Amer 24 (L) >60 mL/min   Anion gap 12 5 - 15  Glucose, capillary     Status: Abnormal   Collection Time: 09/29/14  7:47 AM  Result Value Ref Range   Glucose-Capillary 289 (H) 65 - 99 mg/dL  Glucose, capillary     Status: Abnormal   Collection Time: 09/29/14 11:58 AM  Result Value Ref Range   Glucose-Capillary 283 (H) 65 - 99 mg/dL    Signed: Duwaine Maxin DO 09/29/2014 3:14PM   Services Ordered on Discharge: None Equipment Ordered on Discharge: None

## 2014-09-29 NOTE — Progress Notes (Signed)
[facility] Progress Note     Subjective: No pain or N/V, no more diarrhea.  No concerns/questions.  Doesn't know why she was admitted, upset she can't eat anything.  Ambulating well OOB.    Objective: Vital signs in last 24 hours: Temp:  [97.5 F (36.4 C)-97.9 F (36.6 C)] 97.9 F (36.6 C) (08/04 0544) Pulse Rate:  [91-104] 100 (08/04 0544) Resp:  [11-23] 18 (08/04 0544) BP: (98-115)/(52-81) 105/68 mmHg (08/04 0544) SpO2:  [95 %-100 %] 96 % (08/04 0544) Weight:  [89.631 kg (197 lb 9.6 oz)] 89.631 kg (197 lb 9.6 oz) (08/03 2054) Last BM Date: 09/28/14  Intake/Output from previous day: 08/03 0701 - 08/04 0700 In: 2531.3 [P.O.:510; I.V.:1521.3; IV Piggyback:500] Out: 150 [Urine:150] Intake/Output this shift:    PE: Gen:  Alert, NAD, pleasant Abd: Soft, NT/ND, +BS, no HSM   Lab Results:   Recent Labs  09/28/14 0443  WBC 10.1  HGB 11.5*  HCT 34.5*  PLT 190   BMET  Recent Labs  09/28/14 0443 09/29/14 0445  NA 141 138  K 4.7 4.1  CL 103 106  CO2 20* 20*  GLUCOSE 317* 233*  BUN 70* 67*  CREATININE 2.72* 2.24*  CALCIUM 9.8 8.8*   PT/INR No results for input(s): LABPROT, INR in the last 72 hours. CMP     Component Value Date/Time   NA 138 09/29/2014 0445   NA 145* 06/22/2014 1534   K 4.1 09/29/2014 0445   CL 106 09/29/2014 0445   CO2 20* 09/29/2014 0445   GLUCOSE 233* 09/29/2014 0445   GLUCOSE 148* 06/22/2014 1534   BUN 67* 09/29/2014 0445   BUN 34* 06/22/2014 1534   CREATININE 2.24* 09/29/2014 0445   CREATININE 1.99* 03/31/2014 1500   CALCIUM 8.8* 09/29/2014 0445   PROT 5.9* 09/29/2014 0445   PROT 6.7 01/10/2014 1011   ALBUMIN 3.2* 09/29/2014 0445   AST 82* 09/29/2014 0445   ALT 95* 09/29/2014 0445   ALKPHOS 108 09/29/2014 0445   BILITOT 1.0 09/29/2014 0445   GFRNONAA 21* 09/29/2014 0445   GFRAA 24* 09/29/2014 0445   Lipase     Component Value Date/Time   LIPASE 34 09/28/2014 0443       Studies/Results: Ct Abdomen  Pelvis Wo Contrast  09/28/2014   CLINICAL DATA:  Emesis and diarrhea, onset yesterday.  EXAM: CT ABDOMEN AND PELVIS WITHOUT CONTRAST  TECHNIQUE: Multidetector CT imaging of the abdomen and pelvis was performed following the standard protocol without IV contrast.  COMPARISON:  Ultrasound 04/18/2014  FINDINGS: There is marked mural thickening and pericolonic inflammatory change involving the ascending colon with appearances consistent with colitis or ischemia. There is no obstruction. There is no extraluminal air. The remainder of the colon is remarkable only for mild diverticulosis. Small bowel is unremarkable. Stomach is unremarkable.  There is massive enlargement of the uterus with numerous calcified fibroids. The uterus measures approximately 14 x 15 by 19 cm.  There is a 1.9 cm within the gallbladder lumen. There is no bile duct dilatation. There are unremarkable unenhanced appearances of the liver, pancreas, spleen and adrenals.  There is a 3 mm upper pole right collecting system calculus and a 2 mm lower pole right collecting system calculus. There is a 2 mm lower pole left collecting system calculus. There is a 6 cm upper pole left renal cyst. No ureteral calculi are evident.  The abdominal aorta is normal in caliber. There is moderate atherosclerotic calcification. There is no adenopathy in the abdomen  or pelvis.  There is no significant skeletal lesion. Moderately severe degenerative disc changes are present in the lower lumbar spine and lumbosacral junction.  IMPRESSION: 1. Marked mural thickening and pericolonic inflammatory change involving the ascending colon, consistent with colitis or ischemia. No obstruction. No extraluminal air. 2. Diverticulosis 3. Cholelithiasis 4. Nephrolithiasis 5. Markedly enlarged uterus containing numerous calcified fibroid.   Electronically Signed   By: Andreas Newport M.D.   On: 09/28/2014 06:05    Anti-infectives: Anti-infectives    Start     Dose/Rate Route  Frequency Ordered Stop   09/29/14 1008  ciprofloxacin (CIPRO) IVPB 400 mg     400 mg 200 mL/hr over 60 Minutes Intravenous Every 24 hours 09/28/14 1501     09/28/14 1000  ciprofloxacin (CIPRO) IVPB 400 mg  Status:  Discontinued     400 mg 200 mL/hr over 60 Minutes Intravenous Every 12 hours 09/28/14 0946 09/28/14 1501   09/28/14 1000  metroNIDAZOLE (FLAGYL) IVPB 500 mg     500 mg 100 mL/hr over 60 Minutes Intravenous Every 8 hours 09/28/14 0946         Assessment/Plan Right sided colitis - ischemic vs infectious -No surgical problems at this time -Advance diet as tolerated to bland/soft diet or BRAT diet, stay hydrated -Patient wants her diet advanced and to be discharged home today, she is feeling much better       Nat Christen 09/29/2014, 8:57 AM Pager: [phone]

## 2014-09-29 NOTE — Telephone Encounter (Signed)
Message from [place] from [facility] it was difficult to ascertain if she is suppose to be on Plavix and Losartan so it was not restarted at discharge  Need to follow up if she is suppose to be on those medications or not

## 2014-09-29 NOTE — Telephone Encounter (Signed)
Aaron Edelman Calling form [facility] needs to clarify from Dr. Percival Spanish Last notes the medication Regiment .

## 2014-09-29 NOTE — Progress Notes (Signed)
Lactic acid=2.4,MD on call made aware.No further orders received.

## 2014-09-29 NOTE — Discharge Instructions (Addendum)
You were hospitalized in order to treat you for an infection of your colon and to give you intravenous fluids for dehydration. We have started you on two antibiotics, Ciprofloxacin and Metronidazole which you will continue for 3 days ending on the August 7th. Take your Ciprofloxacin medication 1 pill, two times every day. Take your Metronidazole medication 1 pill, three times every day. You should begin your first dose of each medication tonight, the day of your discharge.  You were dehydrated during your admission which may be the result of your recent upper respiratory infection and diarrheal illness combined with your Lasix fluid pill and decreased water consumption. You have received intravenous fluids in order to help rehydrate you and restore your kidney function. We want you to continue to drink fluids at home, and attempt to stay hydrated with ~2 liters of water each day. You should continue to take your Lasix fluid pill every other day until you see your Cardiologist next week.  We have decreased the dose of your Januvia diabetes medication from 100mg  to 25mg  daily, because of your reduced kidney function. Please address your diabetes medications with your doctor at your follow up visit.   Dehydration, Adult Dehydration means your body does not have as much fluid as it needs. Your kidneys, brain, and heart will not work properly without the right amount of fluids and salt.  HOME CARE  Ask your doctor how to replace body fluid losses (rehydrate). You should try to drink around 2 liters of fluid each day.  Drink enough fluids to keep your pee (urine) clear or pale yellow.  Drink small amounts of fluids often if you feel sick to your stomach (nauseous) or throw up (vomit).  Eat like you normally do.  Avoid:  Foods or drinks high in sugar.  Bubbly (carbonated) drinks.  Juice.  Very hot or cold fluids.  Drinks with caffeine.  Fatty, greasy foods.  Alcohol.  Tobacco.  Eating  too much.  Gelatin desserts.  Wash your hands to avoid spreading germs (bacteria, viruses).  Only take medicine as told by your doctor.  Keep all doctor visits as told. GET HELP RIGHT AWAY IF:   You cannot drink something without throwing up.  You get worse even with treatment.  Your vomit has blood in it or looks greenish.  Your poop (stool) has blood in it or looks black and tarry.  You have not peed in 6 to 8 hours.  You pee a small amount of very dark pee.  You have a fever.  You pass out (faint).  You have belly (abdominal) pain that gets worse or stays in one spot (localizes).  You have a rash, stiff neck, or bad headache.  You get easily annoyed, sleepy, or are hard to wake up.  You feel weak, dizzy, or very thirsty.  You begin to gain weight: more than 3 pounds in 24 hours, or more than 5 pounds in 1 week. MAKE SURE YOU:   Understand these instructions.  Will watch your condition.  Will get help right away if you are not doing well or get worse. Document Released: 12/08/2008 Document Revised: 05/06/2011 Document Reviewed: 10/01/2010 [facility] Patient Information 2015 [place], [place]. This information is not intended to replace advice given to you by your health care provider. Make sure you discuss any questions you have with your health care provider.

## 2014-09-30 ENCOUNTER — Telehealth: Payer: Self-pay | Admitting: Nurse Practitioner

## 2014-09-30 ENCOUNTER — Telehealth: Payer: Self-pay | Admitting: Cardiology

## 2014-09-30 NOTE — Telephone Encounter (Signed)
Appointment given for Monday August 8 @ 3:30 with Ronnald Collum, FNP.

## 2014-09-30 NOTE — Telephone Encounter (Signed)
New message    Pt was taken off of blood pressure medication while in hospital Pt daughter would like to know if pt needs to start taking medication again

## 2014-09-30 NOTE — Telephone Encounter (Signed)
Spoke with daughter about pt's discharge meds.  Carvedilol is on her med list but pt was told not to take it until her BP came back up.  She had been hypotensive d/t dehydration.  Daughter is wanting to know if pt should restart.  Advised daughter we will need to know what her BP/HR are before we can tell her to restart.  Advised to obtain a monitor for home use.  Reviewed parameters of what pt's normal BP's are and when daughter/pt should be concerned.  Daughter stated understanding and will call back with further questions or concerns.  Pt will keep her follow up as scheduled 8/12.

## 2014-09-30 NOTE — Telephone Encounter (Signed)
Left message to call back.  Unsure of which medication she isquestioning.  Losartan was stopped on 04/22/2014

## 2014-10-02 ENCOUNTER — Encounter (HOSPITAL_COMMUNITY): Payer: Self-pay | Admitting: *Deleted

## 2014-10-02 ENCOUNTER — Inpatient Hospital Stay (HOSPITAL_COMMUNITY)
Admission: EM | Admit: 2014-10-02 | Discharge: 2014-10-08 | DRG: 302 | Disposition: A | Discharge status: Home / Self Care | Payer: BLUE CROSS/BLUE SHIELD | Attending: Internal Medicine | Admitting: Internal Medicine

## 2014-10-02 ENCOUNTER — Emergency Department (HOSPITAL_COMMUNITY): Payer: BLUE CROSS/BLUE SHIELD

## 2014-10-02 DIAGNOSIS — R7401 Elevation of levels of liver transaminase levels: Secondary | ICD-10-CM

## 2014-10-02 DIAGNOSIS — Z7901 Long term (current) use of anticoagulants: Secondary | ICD-10-CM | POA: Diagnosis not present

## 2014-10-02 DIAGNOSIS — N179 Acute kidney failure, unspecified: Secondary | ICD-10-CM | POA: Diagnosis present

## 2014-10-02 DIAGNOSIS — D508 Other iron deficiency anemias: Secondary | ICD-10-CM

## 2014-10-02 DIAGNOSIS — Z79899 Other long term (current) drug therapy: Secondary | ICD-10-CM

## 2014-10-02 DIAGNOSIS — Z7982 Long term (current) use of aspirin: Secondary | ICD-10-CM

## 2014-10-02 DIAGNOSIS — I129 Hypertensive chronic kidney disease with stage 1 through stage 4 chronic kidney disease, or unspecified chronic kidney disease: Secondary | ICD-10-CM | POA: Diagnosis present

## 2014-10-02 DIAGNOSIS — E1165 Type 2 diabetes mellitus with hyperglycemia: Secondary | ICD-10-CM | POA: Diagnosis present

## 2014-10-02 DIAGNOSIS — Z87891 Personal history of nicotine dependence: Secondary | ICD-10-CM | POA: Diagnosis not present

## 2014-10-02 DIAGNOSIS — Z9119 Patient's noncompliance with other medical treatment and regimen: Secondary | ICD-10-CM | POA: Diagnosis present

## 2014-10-02 DIAGNOSIS — R740 Nonspecific elevation of levels of transaminase and lactic acid dehydrogenase [LDH]: Secondary | ICD-10-CM | POA: Diagnosis not present

## 2014-10-02 DIAGNOSIS — R570 Cardiogenic shock: Secondary | ICD-10-CM

## 2014-10-02 DIAGNOSIS — E1122 Type 2 diabetes mellitus with diabetic chronic kidney disease: Secondary | ICD-10-CM | POA: Diagnosis present

## 2014-10-02 DIAGNOSIS — I252 Old myocardial infarction: Secondary | ICD-10-CM

## 2014-10-02 DIAGNOSIS — N184 Chronic kidney disease, stage 4 (severe): Secondary | ICD-10-CM | POA: Diagnosis present

## 2014-10-02 DIAGNOSIS — I255 Ischemic cardiomyopathy: Secondary | ICD-10-CM | POA: Diagnosis present

## 2014-10-02 DIAGNOSIS — I5023 Acute on chronic systolic (congestive) heart failure: Secondary | ICD-10-CM | POA: Diagnosis not present

## 2014-10-02 DIAGNOSIS — R630 Anorexia: Secondary | ICD-10-CM | POA: Diagnosis not present

## 2014-10-02 DIAGNOSIS — R945 Abnormal results of liver function studies: Secondary | ICD-10-CM

## 2014-10-02 DIAGNOSIS — K529 Noninfective gastroenteritis and colitis, unspecified: Secondary | ICD-10-CM | POA: Diagnosis present

## 2014-10-02 DIAGNOSIS — Z452 Encounter for adjustment and management of vascular access device: Secondary | ICD-10-CM

## 2014-10-02 DIAGNOSIS — Z95810 Presence of automatic (implantable) cardiac defibrillator: Secondary | ICD-10-CM

## 2014-10-02 DIAGNOSIS — E860 Dehydration: Secondary | ICD-10-CM | POA: Diagnosis present

## 2014-10-02 DIAGNOSIS — I509 Heart failure, unspecified: Secondary | ICD-10-CM | POA: Insufficient documentation

## 2014-10-02 DIAGNOSIS — N189 Chronic kidney disease, unspecified: Secondary | ICD-10-CM

## 2014-10-02 DIAGNOSIS — R110 Nausea: Secondary | ICD-10-CM

## 2014-10-02 DIAGNOSIS — Z951 Presence of aortocoronary bypass graft: Secondary | ICD-10-CM

## 2014-10-02 DIAGNOSIS — E785 Hyperlipidemia, unspecified: Secondary | ICD-10-CM | POA: Diagnosis present

## 2014-10-02 DIAGNOSIS — R05 Cough: Secondary | ICD-10-CM

## 2014-10-02 DIAGNOSIS — R531 Weakness: Secondary | ICD-10-CM

## 2014-10-02 DIAGNOSIS — Z955 Presence of coronary angioplasty implant and graft: Secondary | ICD-10-CM

## 2014-10-02 DIAGNOSIS — I2510 Atherosclerotic heart disease of native coronary artery without angina pectoris: Principal | ICD-10-CM

## 2014-10-02 DIAGNOSIS — I429 Cardiomyopathy, unspecified: Secondary | ICD-10-CM

## 2014-10-02 DIAGNOSIS — I5043 Acute on chronic combined systolic (congestive) and diastolic (congestive) heart failure: Secondary | ICD-10-CM | POA: Diagnosis present

## 2014-10-02 DIAGNOSIS — R5383 Other fatigue: Secondary | ICD-10-CM

## 2014-10-02 DIAGNOSIS — E872 Acidosis: Secondary | ICD-10-CM | POA: Diagnosis present

## 2014-10-02 DIAGNOSIS — I480 Paroxysmal atrial fibrillation: Secondary | ICD-10-CM | POA: Diagnosis present

## 2014-10-02 DIAGNOSIS — I272 Other secondary pulmonary hypertension: Secondary | ICD-10-CM | POA: Diagnosis present

## 2014-10-02 DIAGNOSIS — I5021 Acute systolic (congestive) heart failure: Secondary | ICD-10-CM | POA: Diagnosis present

## 2014-10-02 HISTORY — DX: Chronic kidney disease, stage 4 (severe): N18.4

## 2014-10-02 HISTORY — DX: Pulmonary hypertension, unspecified: I27.20

## 2014-10-02 HISTORY — DX: Vascular disorder of intestine, unspecified: K55.9

## 2014-10-02 HISTORY — DX: Nonspecific elevation of levels of transaminase and lactic acid dehydrogenase (ldh): R74.0

## 2014-10-02 HISTORY — DX: Elevation of levels of liver transaminase levels: R74.01

## 2014-10-02 HISTORY — DX: Cardiogenic shock: R57.0

## 2014-10-02 LAB — CBC WITH DIFFERENTIAL/PLATELET
Basophils Absolute: 0 10*3/uL (ref 0.0–0.1)
Basophils Relative: 0 % (ref 0–1)
EOS ABS: 0.1 10*3/uL (ref 0.0–0.7)
EOS PCT: 1 % (ref 0–5)
HEMATOCRIT: 33.9 % — AB (ref 36.0–46.0)
Hemoglobin: 11.4 g/dL — ABNORMAL LOW (ref 12.0–15.0)
Lymphocytes Relative: 13 % (ref 12–46)
Lymphs Abs: 1.2 10*3/uL (ref 0.7–4.0)
MCH: 30.6 pg (ref 26.0–34.0)
MCHC: 33.6 g/dL (ref 30.0–36.0)
MCV: 91.1 fL (ref 78.0–100.0)
Monocytes Absolute: 0.9 10*3/uL (ref 0.1–1.0)
Monocytes Relative: 10 % (ref 3–12)
Neutro Abs: 6.8 10*3/uL (ref 1.7–7.7)
Neutrophils Relative %: 76 % (ref 43–77)
Platelets: 196 10*3/uL (ref 150–400)
RBC: 3.72 MIL/uL — ABNORMAL LOW (ref 3.87–5.11)
RDW: 14.5 % (ref 11.5–15.5)
WBC: 8.9 10*3/uL (ref 4.0–10.5)

## 2014-10-02 LAB — COMPREHENSIVE METABOLIC PANEL
ALK PHOS: 192 U/L — AB (ref 38–126)
ALT: 378 U/L — ABNORMAL HIGH (ref 14–54)
AST: 259 U/L — ABNORMAL HIGH (ref 15–41)
Albumin: 3.5 g/dL (ref 3.5–5.0)
Anion gap: 13 (ref 5–15)
BUN: 60 mg/dL — ABNORMAL HIGH (ref 6–20)
CHLORIDE: 103 mmol/L (ref 101–111)
CO2: 19 mmol/L — AB (ref 22–32)
Calcium: 9 mg/dL (ref 8.9–10.3)
Creatinine, Ser: 2.58 mg/dL — ABNORMAL HIGH (ref 0.44–1.00)
GFR, EST AFRICAN AMERICAN: 21 mL/min — AB (ref >60)
GFR, EST NON AFRICAN AMERICAN: 18 mL/min — AB (ref >60)
Glucose, Bld: 320 mg/dL — ABNORMAL HIGH (ref 65–99)
Potassium: 4.5 mmol/L (ref 3.5–5.1)
Sodium: 135 mmol/L (ref 135–145)
Total Bilirubin: 1.5 mg/dL — ABNORMAL HIGH (ref 0.3–1.2)
Total Protein: 6.4 g/dL — ABNORMAL LOW (ref 6.5–8.1)

## 2014-10-02 LAB — TROPONIN I: Troponin I: <0.03 ng/mL (ref <0.031)

## 2014-10-02 LAB — I-STAT CG4 LACTIC ACID, ED
LACTIC ACID, VENOUS: 2.97 mmol/L — AB (ref 0.5–2.0)
LACTIC ACID, VENOUS: 2.92 mmol/L — AB (ref 0.5–2.0)

## 2014-10-02 LAB — BRAIN NATRIURETIC PEPTIDE: B Natriuretic Peptide: 2880.4 pg/mL — ABNORMAL HIGH (ref 0.0–100.0)

## 2014-10-02 LAB — GLUCOSE, CAPILLARY: Glucose-Capillary: 269 mg/dL — ABNORMAL HIGH (ref 65–99)

## 2014-10-02 LAB — LIPASE, BLOOD: Lipase: 50 U/L (ref 22–51)

## 2014-10-02 MED ORDER — INSULIN ASPART 100 UNIT/ML ~~LOC~~ SOLN
0.0000 [IU] | Freq: Three times a day (TID) | SUBCUTANEOUS | Status: DC
  Administered 2014-10-03: 2 [IU] via SUBCUTANEOUS
  Administered 2014-10-03: 3 [IU] via SUBCUTANEOUS

## 2014-10-02 MED ORDER — BENZONATATE 100 MG PO CAPS
100.0000 mg | ORAL_CAPSULE | Freq: Two times a day (BID) | ORAL | Status: DC
  Administered 2014-10-02 – 2014-10-08 (×10): 100 mg via ORAL
  Filled 2014-10-02 (×13): qty 1

## 2014-10-02 MED ORDER — SODIUM CHLORIDE 0.9 % IV SOLN
INTRAVENOUS | Status: AC
  Administered 2014-10-02: 23:00:00 via INTRAVENOUS

## 2014-10-02 MED ORDER — FLUTICASONE PROPIONATE 50 MCG/ACT NA SUSP
2.0000 | Freq: Every day | NASAL | Status: DC
  Administered 2014-10-03 – 2014-10-08 (×6): 2 via NASAL
  Filled 2014-10-02: qty 16

## 2014-10-02 MED ORDER — SODIUM CHLORIDE 0.9 % IV BOLUS (SEPSIS)
500.0000 mL | Freq: Once | INTRAVENOUS | Status: AC
  Administered 2014-10-02: 500 mL via INTRAVENOUS

## 2014-10-02 MED ORDER — ONDANSETRON HCL 4 MG/2ML IJ SOLN: 4.0000 mg | Freq: Four times a day (QID) | INTRAMUSCULAR | Status: DC | PRN

## 2014-10-02 MED ORDER — LORATADINE 10 MG PO TABS
10.0000 mg | ORAL_TABLET | Freq: Every day | ORAL | Status: DC
  Administered 2014-10-03 – 2014-10-08 (×6): 10 mg via ORAL
  Filled 2014-10-02 (×7): qty 1

## 2014-10-02 MED ORDER — ONDANSETRON HCL 4 MG PO TABS
4.0000 mg | ORAL_TABLET | Freq: Four times a day (QID) | ORAL | Status: DC | PRN
  Administered 2014-10-03 – 2014-10-04 (×2): 4 mg via ORAL
  Filled 2014-10-02 (×2): qty 1

## 2014-10-02 MED ORDER — PANTOPRAZOLE SODIUM 40 MG PO TBEC
40.0000 mg | DELAYED_RELEASE_TABLET | Freq: Every day | ORAL | Status: DC
  Administered 2014-10-02: 40 mg via ORAL
  Filled 2014-10-02 (×3): qty 1

## 2014-10-02 MED ORDER — APIXABAN 5 MG PO TABS
5.0000 mg | ORAL_TABLET | Freq: Two times a day (BID) | ORAL | Status: DC
  Administered 2014-10-02 – 2014-10-03 (×2): 5 mg via ORAL
  Filled 2014-10-02 (×3): qty 1

## 2014-10-02 MED ORDER — SODIUM CHLORIDE 0.9 % IJ SOLN
3.0000 mL | Freq: Two times a day (BID) | INTRAMUSCULAR | Status: DC
  Administered 2014-10-02 – 2014-10-04 (×4): 3 mL via INTRAVENOUS

## 2014-10-02 MED ORDER — INSULIN ASPART 100 UNIT/ML ~~LOC~~ SOLN
0.0000 [IU] | Freq: Every day | SUBCUTANEOUS | Status: DC
  Administered 2014-10-02: 3 [IU] via SUBCUTANEOUS

## 2014-10-02 MED ORDER — DIPHENHYDRAMINE HCL 25 MG PO CAPS
25.0000 mg | ORAL_CAPSULE | Freq: Once | ORAL | Status: AC
  Administered 2014-10-02: 25 mg via ORAL
  Filled 2014-10-02: qty 1

## 2014-10-02 MED ORDER — ACETAMINOPHEN 325 MG PO TABS
650.0000 mg | ORAL_TABLET | Freq: Once | ORAL | Status: AC
  Administered 2014-10-02: 650 mg via ORAL
  Filled 2014-10-02: qty 2

## 2014-10-02 MED ORDER — CARVEDILOL 3.125 MG PO TABS
3.1250 mg | ORAL_TABLET | Freq: Two times a day (BID) | ORAL | Status: DC
  Administered 2014-10-03 – 2014-10-04 (×3): 3.125 mg via ORAL
  Filled 2014-10-02 (×6): qty 1

## 2014-10-02 NOTE — H&P (Signed)
Date: 10/02/2014               Patient Name:  Jo Lynn MRN: [id number]  DOB: 10/28/1945 Age / Sex: 69 y.o., female   PCP: Chevis Pretty, FNP         Medical Service: Internal Medicine Teaching Service         Attending Physician: Dr. Oval Linsey, MD    First Contact: Dr. Loleta Chance Pager: [phone]  Second Contact: Dr. Duwaine Maxin Pager: [phone]       After Hours (After 5p/  First Contact Pager: [phone]  weekends / holidays): Second Contact Pager: [phone]   Chief Complaint: Arty Baumgartner, nausea, lack of appetite  History of Present Illness: Jo Lynn is a 68 y.o. AA female with past medical history of hypertension, hyperlipidemia, diabetes, CKD stage IV,  STEMI s/p PCI and CABG, atrial fibrillation, HFrEF (EF 20-25% in February 2015 who was recently discharged home from this hospital on September 29, 2014.  During that admission she was treated for colitis, possibly bacterial vs viral.  Given patients risk and comorbidities, she was treated as having a bacterial infection and given empirical  IV antibiotic therapy.  She was discharged home on oral ciprofloxacin and metronidazole to complete a 5 day course of each. Patient reports that on day of discharge she was doing quite well with good appetite and feeling well.  Since that time, states her health has declinced.  She states that she has been weak, nauseous without vomiting, has no appetite and, thus, has not been eating or drinking much of anything.  She reports continuing to take all of her medications as prescribed other than her antibiotics.  States she took no antibiotics today and yesterday took both doses of cipro but only two doses of Flagyl.  Patient believes the antibiotics are part of why she is feeling unwell at this time.  She states that she has not been up out of bed much at all since being discharged from the hospital and has noticed a concomitant increase in her leg swelling.  She reports a continued dry cough  that has been present for several weeks.  She denies any chest pain, fever, chills, urinary complaints.  Reports regular bowel movements since discharge.  Meds: Current Facility-Administered Medications  Medication Dose Route Frequency Provider Last Rate Last Dose  . 0.9 %  sodium chloride infusion   Intravenous Continuous Corky Sox, MD      . apixaban Arne Cleveland) tablet 5 mg  5 mg Oral BID Corky Sox, MD      . benzonatate (TESSALON) capsule 100 mg  100 mg Oral BID Corky Sox, MD      . Derrill Memo ON 10/03/2014] carvedilol (COREG) tablet 3.125 mg  3.125 mg Oral BID WC Corky Sox, MD      . fluticasone [facility]) 50 MCG/ACT nasal spray 2 spray  2 spray Each Nare Daily Corky Sox, MD      . insulin aspart (novoLOG) injection 0-5 Units  0-5 Units Subcutaneous QHS Corky Sox, MD      . Derrill Memo ON 10/03/2014] insulin aspart (novoLOG) injection 0-9 Units  0-9 Units Subcutaneous TID WC Corky Sox, MD      . Derrill Memo ON 10/03/2014] loratadine (CLARITIN) tablet 10 mg  10 mg Oral Daily Corky Sox, MD      . ondansetron [facility]) tablet 4 mg  4 mg Oral Q6H PRN Corky Sox, MD  Or  . ondansetron (ZOFRAN) injection 4 mg  4 mg Intravenous Q6H PRN Courtney Paris, MD      . pantoprazole (PROTONIX) EC tablet 40 mg  40 mg Oral QHS Courtney Paris, MD      . sodium chloride 0.9 % injection 3 mL  3 mL Intravenous Q12H Courtney Paris, MD        Allergies: Allergies as of 10/02/2014 - Review Complete 10/02/2014  Allergen Reaction Noted  . Prednisone  07/12/2014   Past Medical History  Diagnosis Date  . Myocardial infarction 10/2002    MV CAD --> Referred for CABG  . 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 Zenaida Niece Trigt: LIMA-LAD, SVG-RPDA, SVG-OM1-OM2  . ST elevation myocardial infarction (STEMI) of inferolateral wall 2015, 2016    SVG-Om1-2 occluded - PCI in 2 locations;  Occluded SVG-RCA & native RCA, LAD & native Cx occcluded.  EF ~20%.  SVG to OM with 99% stenosis treated with balloon angioplasty.  05/2024.   . Cardiomyopathy, ischemic 09/09/2013    EF ~20-25%; Large MI, existing occluded RCA & SVG-RCA; h/o CABG  . 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 CAD S/P percutaneous coronary angioplasty 09/09/2013    mid SVG-OM1-OM2 (prox LIMB) Rebel BMS 4.0 mm x 16 mm; sequential limb OM1-OM2: Rebel BMS 4.0 mm x 12 mm  . Combined systolic and diastolic congestive heart failure, NYHA class 3 09/09/2013    Echo post Inferolateral STEMI: Severely reduced LVEF ~20-35%; Akinesis of Inferolateral, Inferior & Inferoseptal walls; Grade 1 DDysfxn (initial LVEDP ~26 mmHg with SBP of 99 mHg)  . Essential hypertension 08/17/2012  . Hyperlipidemia with target LDL less than 70 08/17/2012  . Diabetes mellitus type 2 with complications 08/17/2012  . 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 Ladona Ridgel  . Colitis    Past Surgical History  Procedure Laterality Date  . Coronary artery bypass graft    . Left heart catheterization with coronary angiogram N/A 09/09/2013    Procedure: LEFT HEART CATHETERIZATION WITH CORONARY ANGIOGRAM;  Surgeon: Marykay Lex, MD;  Location: [facility] CATH LAB;  Service: Cardiovascular;  Laterality: N/A;  . Implantable cardioverter defibrillator implant  02-21-2014    STJ single chamber ICD implanted by Dr Ladona Ridgel for primary prevention  . Implantable cardioverter defibrillator implant N/A 02/21/2014    Procedure: IMPLANTABLE CARDIOVERTER DEFIBRILLATOR IMPLANT;  Surgeon: Marinus Maw, MD;  Location: [facility] CATH LAB;  Service: Cardiovascular;  Laterality: N/A;  . Left heart catheterization with coronary  angiogram N/A 06/13/2014    Procedure: LEFT HEART CATHETERIZATION WITH CORONARY ANGIOGRAM;  Surgeon: Corky Crafts, MD;  Location: [facility] CATH LAB;  Service: Cardiovascular;  Laterality: N/A;   Family History  Problem Relation Age of Onset  . Diabetes Mother   . Diabetes Father   . Heart attack Neg Hx   . Stroke Neg Hx    History   Social History  . Marital Status: Single    Spouse Name: N/A  . Number of Children: 4  . Years of Education: N/A  Occupational History  . Not on file.   Social History Main Topics  . Smoking status: Former Smoker    Types: Cigarettes  . Smokeless tobacco: Never Used     Comment: quit many years ago  . Alcohol Use: No  . Drug Use: No  . Sexual Activity: Not on file   Other Topics Concern  . Not on file   Social History Narrative    Review of Systems  General: Denies fever, diaphoresis.  Reports decreased appetite and fatigue Respiratory: Denies SOB, cough, and wheezing.   Cardiovascular: Denies chest pain and palpitations.  Gastrointestinal: Reports nausea and occasional LLQ pain.  Denies vomiting and diarrhea Musculoskeletal: Denies myalgias, arthralgias, back pain, and gait problem.  Reports lower extremity swelling.  Neurological: Denies dizziness, syncope, lightheadedness, and headaches. Reports weakness. Psychiatric/Behavioral: Denies mood changes, sleep disturbance, and agitation.   Physical Exam: Blood pressure 110/83, pulse 103, temperature 98.7 F (37.1 C), temperature source Oral, resp. rate 22, height $RemoveBe'5\' 7"'rdYijlZKS$  (1.702 m), weight 190 lb 2 oz (86.24 kg), SpO2 100 %. BP 110/83 mmHg  Pulse 103  Temp(Src) 98.7 F (37.1 C) (Oral)  Resp 22  Ht $R'5\' 7"'cm$  (1.702 m)  Wt 190 lb 2 oz (86.24 kg)  BMI 29.77 kg/m2  SpO2 100%  General:Patient is a well-developed and well-nourished, in no acute distress and cooperative with exam.  Head: Normocephalic and atraumatic. Eyes: EOMI, no scleral icterus Neck: No JVD, normal ROM Cardiovascular:  RRR, S1 normal, S2 normal, no murmurs, gallops, or rubs. Pulmonary/Chest: Air entry equal bilaterally, no wheezes, rales, or rhonchi.  Dry, non-productive cough Abdominal: Soft, non-tender, non-distended, BS + Extremities: 2+ bilateral lower extremity edema, pulses symmetric and intact bilaterally. No cyanosis or clubbing. Neurological: A&O x3, cranial nerve II-XII are grossly intact, no focal motor deficit Skin: Warm, dry and intact. No rashes or erythema. Psychiatric: Normal mood and affect. speech and behavior is normal.    Lab results: Basic Metabolic Panel:  Recent Labs  10/02/14 1750  NA 135  K 4.5  CL 103  CO2 19*  GLUCOSE 320*  BUN 60*  CREATININE 2.58*  CALCIUM 9.0   Liver Function Tests:  Recent Labs  10/02/14 1750  AST 259*  ALT 378*  ALKPHOS 192*  BILITOT 1.5*  PROT 6.4*  ALBUMIN 3.5    Recent Labs  10/02/14 1750  LIPASE 50   CBC:  Recent Labs  10/02/14 1750  WBC 8.9  NEUTROABS 6.8  HGB 11.4*  HCT 33.9*  MCV 91.1  PLT 196   Cardiac Enzymes:  Recent Labs  10/02/14 1749  TROPONINI <0.03   BNP: 2880  Lactic acid: 2.97  Imaging results:  Dg Chest 2 View  10/02/2014   CLINICAL DATA:  Patient recently discharged from hospital complaining of shortness of breath and nausea.  EXAM: CHEST  2 VIEW  COMPARISON:  Chest radiograph 06/13/2014  FINDINGS: Single lead AICD device overlies the left hemi thorax. Stable cardiomegaly status post median sternotomy and CABG procedure. No consolidative pulmonary opacities. No pleural effusion or pneumothorax.  IMPRESSION: No acute cardiopulmonary process.   Electronically Signed   By: Lovey Newcomer M.D.   On: 10/02/2014 17:37    Assessment & Plan by Problem: Active Problems:   CHF exacerbation   Acute renal failure superimposed on stage 4 chronic kidney disease 69 y.o. AA female with past medical history of hypertension, hyperlipidemia, diabetes, CKD stage IV,  STEMI s/p PCI and CABG, atrial fibrillation,  HFrEF (EF 20-25% in February 2016  who was recently discharged home from this hospital on September 29, 2014 who is presenting with progressive generalized weakness and fatigue, anorexia.  Acute on chronic kidney disease, stage IV -patient with 3-4 day history of poor oral intake due to weakness and generally feeling unwell.  Likely contributing to her AKI.  Suspect pre-renal due to decreased fluid intake, decreased cardiac contractility given her HFrEF, and BUN/Cr ratio > 20 -Creatine 2.58.  Was 2.72 on admit from 8/3 with improvement to 2.24 at discharge on 8/4.  From review of prior results, it appears that her baseline is around 2.0.  BUN 60, EGFR 21 today   -gentle rehydration with NS IV fluids @ 75 mL/hr -will hold any further PO antibiotics as this maybe contributing to her nausea.  Patients abdominal exam is benign, normal WBC, reports normal bowel movements over the past few days.  Still endorses a mild LLQ abdominal pain -lactic acid on admission to the ED 2.97.  Repeat lactic acid 3 hours later was 2.92.  Patient had elevated LA on last admission that improved with IV fluids.  Suspect that this will again be the case -continue to monitor Cr and response to IV fluids   Colitis -patient had CT scan of abdomen on 8/3 that was consistent with colitis and treated empirically with IV antibiotics.  Sent home on cipro and Flagyl.  She was supposed to complete her course of abx today, but states that she only took those medications up until yesterday as she believes they made her feel sick.  She is without tenderness on physical exam, reports having bowel movements, afebrile, CBC with white count of 8.9 with normal differential.  She does still endorse some mild left-sided abdominal pain and states that it comes and goes intermittently -will hold off on continuing her antibiotics   Cough -patient states that she has had this persistent cough for a number of weeks and reports that it is a dry cough,  unproductive of mucus or blood.  According to the daughter, it is worsened when she is around potpourri in her house and actually improves when those items are not in the house.  Also states that this is a problem that seems to present every year around this time.  Suspect her symptoms of dry cough are related to allergies that are untreated.  Patient does have prescription for Flonase that she reports non-compliance with.  Chest xray in the ED today was negative for acute process -Flonase 22mcg/act nasal spray.  Two sprays each nostril daily -Claritin $RemoveBeforeD'10mg'[id number]$  PO daily -Tessalon pearls $RemoveBeforeDE'100mg'[id number]$  PO bid  Elevated liver enzymes -AST 259, ALT 378, alk phos 192, total bili 1.5, lipase 50 -based on review of previous labs, these have been elevated consistently since December 2015. -HIDA scan in December 2015 was normal.  Repeat HIDA scan in February with a patent cystic duct and common bile duct with no evidence of acute cholecystitis.  This exam did show a diminished gall bladder ejection fraction of 26% at 45 minutes.  Normal EF at 45 minutes is greater than 40% -her consistently elevated LFTs could by related to poor gall bladder function, medications (statin, allopurinol, cipro).  However, she has been on statin therapy since at least June 2014 with normal LFTs prior to December 2015.  On her admission in April, patient did have normal LFTs and statin was held during that admission.  Cipro is only a recent and temporary medication and she has only been on allopurinol for a few  months according to the patient.   -She had a negative hepatitis panel in December 2015. -CT scan from did show cholelithiasis.  Will obtain repeat RUQ ultrasound to see if there is a stone causing her recent feelings of nausea   Cardiomyopathy s/p ICD, CAD with history of CABG, stents and balloon angioplasty -last ECHO with EF of 20-25% and grade 3 diastolic dysfunction (Feb 2016).  Last cardiac event was April 2016 during  hospitalization for hyperglycemia in which which developed acute STEMI and found to have 99% in-stent stenosis in the SVG to the circumflex.  She was treated with balloon angioplasty.  Also had atrial fibrillation and was urgently cardioverted. -home meds include: Eliquis 5 mg daily, aspirin 81mg  daily, Lipitor 40mg  daily, Coreg 3.125 bid, Lasix 40mg  every other day.  Compliant with meds.  Patient with bilateral lower extremity edema, possibly dependent due to lack of mobility for the past week.  Will have PT see her to encourage getting up and out of bed. -will hold aspirin, Lipitor, Lasix -BNP elevated at 2880.  This looks to be chronically elevated with prior BNPs being elevated >4500.  She is not short of breath, no chest pain, chest xray negative.  She does report orhopnea but states that it is unchanged from previous.  Diabetes mellitus -last A1c 13.7 in April.  CBG today 269. -Home meds include: glimepiride 2mg  daily and sitagliptin 25mg  daily -SSI with HS coverage  Diet: heart healthy  DVT: Eliquis  Code: Full  Dispo: Disposition is deferred at this time, awaiting improvement of current medical problems. Anticipated discharge in approximately 1-2 day(s).   The patient does have a current PCP (Mary-Margaret Hassell Done, FNP) and does not need an [facility] hospital follow-up appointment after discharge.  The patient does not have transportation limitations that hinder transportation to clinic appointments.  Signed: Jule Ser, DO 10/02/2014, 9:26 PM

## 2014-10-02 NOTE — ED Notes (Signed)
Dr Wilson Singer given a copy of lactic acid results 2.97

## 2014-10-02 NOTE — Telephone Encounter (Signed)
She was not Plavix or ACE inhibitor when I last saw her per my last note.  She does not need these meds.

## 2014-10-02 NOTE — ED Notes (Signed)
Internal medicine at bedside

## 2014-10-02 NOTE — ED Provider Notes (Signed)
CSN: [id number]     Arrival date & time 10/02/14  1623 History   First MD Initiated Contact with Patient 10/02/14 1654     Chief Complaint  Patient presents with  . Weakness     (Consider location/radiation/quality/duration/timing/severity/associated sxs/prior Treatment) The history is provided by the patient and medical records. No language interpreter was used.     Jo Lynn is a 69 y.o. female  with a hx of CAD s/p CABG in 2004, STEMI (2015, 2016), HTN, AICD, colitis, NIDDM presents to the Emergency Department complaining of gradual, persistent, progressively worsening weakness onset 3-4 days agp. Associated symptoms include peripheral edema.  Pt was hospitalized last week for colitis and was discharged home with cipro and flagyl on 09/29/2014 from the internal medicine teaching service. Patient's daughter at bedside reports that patient was doing very well on the day of discharge, eating well and feeling much better. Patient's daughter reports that over the last several days her mother's health has declined significantly. She reports that she's been eating and drinking almost nothing that her weakness has increased significantly and that her legs have begun swelling significantly. She reports that she's been taking her medications as directed by feels as if she is getting worse and not better. Patient reports she does not have the energy to do anything and that she is not getting out of bed because of the weakness. Patient and daughter deny fevers or chills. Patient reports persistent nausea without vomiting and 2 episodes of loose stools but no bloody diarrhea. Patient denies focal abdominal pain and reports that she just generally feels unwell. Patient denies dysuria or hematuria. Patient's daughter reports that she has a persistent cough which has been going on for several weeks and continues to persist.    Past Medical History  Diagnosis Date  . Myocardial infarction 10/2002    MV CAD  --> Referred for CABG  . 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 Lucianne Lei Trigt: LIMA-LAD, SVG-RPDA, SVG-OM1-OM2  . ST elevation myocardial infarction (STEMI) of inferolateral wall 2015, 2016    SVG-Om1-2 occluded - PCI in 2 locations; Occluded SVG-RCA & native RCA, LAD & native Cx occcluded.  EF ~20%.  SVG to OM with 99% stenosis treated with balloon angioplasty.  05/2024.   . Cardiomyopathy, ischemic 09/09/2013    EF ~20-25%; Large MI, existing occluded RCA & SVG-RCA; h/o CABG  . 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 CAD S/P percutaneous coronary angioplasty 09/09/2013    mid SVG-OM1-OM2 (prox LIMB) Rebel BMS 4.0 mm x 16 mm; sequential limb OM1-OM2: Rebel BMS 4.0 mm x 12 mm  . Combined systolic and diastolic congestive heart failure, NYHA class 3 09/09/2013    Echo post Inferolateral STEMI: Severely reduced LVEF ~20-35%; Akinesis of Inferolateral, Inferior & Inferoseptal walls; Grade 1 DDysfxn (initial LVEDP ~26 mmHg with SBP of 99 mHg)  . Essential hypertension 08/17/2012  . Hyperlipidemia with target LDL less than 70 08/17/2012  . Diabetes mellitus type 2 with complications 7/56/4332  . 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  Lovena Le  . Colitis    Past Surgical History  Procedure Laterality Date  . Coronary artery bypass graft    . Left heart catheterization with coronary angiogram N/A 09/09/2013    Procedure: LEFT HEART CATHETERIZATION WITH CORONARY ANGIOGRAM;  Surgeon: Leonie Man, MD;  Location: [facility] CATH LAB;  Service: Cardiovascular;  Laterality: N/A;  . Implantable cardioverter  defibrillator implant  02-21-2014    STJ single chamber ICD implanted by Dr Lovena Le for primary prevention  . Implantable cardioverter defibrillator implant N/A 02/21/2014    Procedure: IMPLANTABLE CARDIOVERTER DEFIBRILLATOR IMPLANT;  Surgeon: Evans Lance, MD;  Location: [facility] CATH LAB;  Service: Cardiovascular;  Laterality: N/A;  . Left heart catheterization with coronary angiogram N/A 06/13/2014    Procedure: LEFT HEART CATHETERIZATION WITH CORONARY ANGIOGRAM;  Surgeon: Jettie Booze, MD;  Location: [facility] CATH LAB;  Service: Cardiovascular;  Laterality: N/A;   Family History  Problem Relation Age of Onset  . Diabetes Mother   . Diabetes Father   . Heart attack Neg Hx   . Stroke Neg Hx    History  Substance Use Topics  . Smoking status: Former Smoker    Types: Cigarettes  . Smokeless tobacco: Never Used     Comment: quit many years ago  . Alcohol Use: No   OB History    No data available     Review of Systems  Constitutional: Positive for appetite change (decreased) and fatigue. Negative for fever, diaphoresis and unexpected weight change.  HENT: Negative for mouth sores.   Eyes: Negative for visual disturbance.  Respiratory: Negative for cough, chest tightness, shortness of breath and wheezing.   Cardiovascular: Negative for chest pain.  Gastrointestinal: Positive for nausea and diarrhea (x2 episodes of loose stool). Negative for vomiting, abdominal pain and constipation.  Endocrine: Negative for polydipsia, polyphagia and polyuria.  Genitourinary: Negative for dysuria, urgency, frequency and hematuria.  Musculoskeletal: Negative for back pain and neck stiffness.  Skin: Negative for rash.  Allergic/Immunologic: Negative for immunocompromised state.  Neurological: Positive for weakness (generalized). Negative for syncope, light-headedness and headaches.  Hematological: Does not bruise/bleed easily.  Psychiatric/Behavioral: Negative for sleep disturbance. The patient is not  nervous/anxious.       Allergies  Prednisone  Home Medications   Prior to Admission medications   Medication Sig Start Date End Date Taking? Authorizing Provider  allopurinol (ZYLOPRIM) 100 MG tablet Take 0.5 tablets (50 mg total) by mouth 2 (two) times daily. 06/16/14   Ripudeep Krystal Eaton, MD  apixaban (ELIQUIS) 5 MG TABS tablet Take 1 tablet (5 mg total) by mouth 2 (two) times daily. 07/12/14   Minus Breeding, MD  aspirin EC 81 MG tablet Take 1 tablet (81 mg total) by mouth daily. 08/03/14   Minus Breeding, MD  atorvastatin (LIPITOR) 40 MG tablet Take 1 tablet (40 mg total) by mouth at bedtime. 07/12/14   Minus Breeding, MD  carvedilol (COREG) 3.125 MG tablet Take 1 tablet (3.125 mg total) by mouth 2 (two) times daily with a meal. 07/12/14   Minus Breeding, MD  ciprofloxacin (CIPRO) 500 MG tablet Take 1 tablet (500 mg total) by mouth 2 (two) times daily. 09/29/14 10/02/14  Francesca Oman, DO  fluticasone (FLONASE) 50 MCG/ACT nasal spray Place 2 sprays into both nostrils daily. 04/28/14   Mary-Margaret Hassell Done, FNP  furosemide (LASIX) 40 MG tablet Take 1 tablet (40 mg total) by mouth every other day. 07/12/14   Evans Lance, MD  glimepiride (AMARYL) 2 MG tablet Take  1 tablet (2 mg total) by mouth daily with breakfast. 06/16/14   Ripudeep Krystal Eaton, MD  HYDROcodone-homatropine (HYCODAN) 5-1.5 MG/5ML syrup Take 5 mLs by mouth every 6 (six) hours as needed for cough.  09/27/14   Historical Provider, MD  lidocaine (LMX) 4 % cream Apply topically daily as needed (foot/leg pain). 06/16/14   Ripudeep Krystal Eaton, MD  metroNIDAZOLE (FLAGYL) 500 MG tablet Take 1 tablet (500 mg total) by mouth 3 (three) times daily. 09/29/14 10/02/14  Francesca Oman, DO  nitroGLYCERIN (NITROSTAT) 0.4 MG SL tablet Place 1 tablet (0.4 mg total) under the tongue every 5 (five) minutes as needed for chest pain. 12/01/13   Minus Breeding, MD  pantoprazole (PROTONIX) 40 MG tablet Take 1 tablet (40 mg total) by mouth daily. 07/12/14   Minus Breeding, MD   sitaGLIPtin (JANUVIA) 25 MG tablet Take 1 tablet (25 mg total) by mouth daily. 09/29/14   Francesca Oman, DO   BP 114/72 mmHg  Pulse 97  Temp(Src) 98.3 F (36.8 C)  Resp 21  Ht 5\' 7"  (1.702 m)  Wt 190 lb 2 oz (86.24 kg)  BMI 29.77 kg/m2  SpO2 94% Physical Exam  Constitutional: She appears well-developed and well-nourished. No distress.  Awake, alert,  Fatigued appearing  HENT:  Head: Normocephalic and atraumatic.  Mouth/Throat: Oropharynx is clear and moist. No oropharyngeal exudate.  Eyes: Conjunctivae are normal. No scleral icterus.  Neck: Normal range of motion. Neck supple. No JVD present.  Cardiovascular: Normal rate, regular rhythm, normal heart sounds and intact distal pulses.   No murmur heard. Pulmonary/Chest: Effort normal and breath sounds normal. No respiratory distress. She has no wheezes.  Equal chest expansion Clear and equal breath sounds; no rales  Abdominal: Soft. Bowel sounds are normal. She exhibits no mass. There is no tenderness. There is no rebound and no guarding.  Musculoskeletal: Normal range of motion. She exhibits edema.  2+ pitting edema of the BLE  Neurological: She is alert.  Speech is clear and goal oriented Moves extremities without ataxia  Skin: Skin is warm and dry. She is not diaphoretic. No erythema.  Psychiatric: She has a normal mood and affect.  Nursing note and vitals reviewed.   ED Course  Procedures (including critical care time) Labs Review Labs Reviewed  COMPREHENSIVE METABOLIC PANEL - Abnormal; Notable for the following:    CO2 19 (*)    Glucose, Bld 320 (*)    BUN 60 (*)    Creatinine, Ser 2.58 (*)    Total Protein 6.4 (*)    AST 259 (*)    ALT 378 (*)    Alkaline Phosphatase 192 (*)    Total Bilirubin 1.5 (*)    GFR calc non Af Amer 18 (*)    GFR calc Af Amer 21 (*)    All other components within normal limits  CBC WITH DIFFERENTIAL/PLATELET - Abnormal; Notable for the following:    RBC 3.72 (*)    Hemoglobin 11.4  (*)    HCT 33.9 (*)    All other components within normal limits  BRAIN NATRIURETIC PEPTIDE - Abnormal; Notable for the following:    B Natriuretic Peptide 2880.4 (*)    All other components within normal limits  I-STAT CG4 LACTIC ACID, ED - Abnormal; Notable for the following:    Lactic Acid, Venous 2.97 (*)    All other components within normal limits  TROPONIN I  LIPASE, BLOOD    Imaging Review Dg Chest 2 View  10/02/2014   CLINICAL DATA:  Patient recently discharged from hospital complaining of shortness of breath and nausea.  EXAM: CHEST  2 VIEW  COMPARISON:  Chest radiograph 06/13/2014  FINDINGS: Single lead AICD device overlies the left hemi thorax. Stable cardiomegaly status post median sternotomy and CABG procedure. No consolidative pulmonary opacities. No pleural effusion or pneumothorax.  IMPRESSION: No acute cardiopulmonary process.   Electronically Signed   By: Lovey Newcomer M.D.   On: 10/02/2014 17:37     EKG Interpretation None      MDM   Final diagnoses:  Acute-on-chronic kidney injury  Colitis  CHF exacerbation  Decreased appetite  Generalized weakness  Acute on chronic systolic CHF (congestive heart failure)  Other iron deficiency anemias     Shaquaya Wuellner presents with increasing fatigue and generalized weakness. Patient's been treated with outpatient antibiotics for colitis however I am concerned that her colitis is worsening causing her decreased appetite and by mouth intake. 2 episodes of loose stools without discrete watery diarrhea or bloody diarrhea however at this time I'm concerned about an outpatient treatment failure. Patient also with increasing peripheral edema of her legs question if this is from her immobility secondary to fatigue or worsening CHF. Labs are pending. Chest x-ray is without acute of normality, evidence of pneumonia or pulmonary edema. No rales on exam.  7:24 PM Patient with elevated lactic acid, BUN and creatinine, AST/ALT, alkaline  phosphatase, total bili and BNP. Concern the patient has a worsening of her colitis failing her outpatient treatment of Cipro and Flagyl. She is evidence of any GI and worsening CHF exacerbation with increased BNP and swelling of her legs. No evidence of sepsis today. She's afebrile without tachycardia or hypotension. No hypoxia at this time. No focal abdominal pain and abdomen is soft. Patient is a bounce back from internal medicine teaching service who will admit at this time to telemetry.  BP 114/72 mmHg  Pulse 97  Temp(Src) 98.3 F (36.8 C)  Resp 21  Ht 5\' 7"  (1.702 m)  Wt 190 lb 2 oz (86.24 kg)  BMI 29.77 kg/m2  SpO2 94%  The patient was discussed with and seen by Dr. Wilson Singer who agrees with the treatment plan.   Jarrett Soho Nguyen Todorov, PA-C 10/02/14 1938  Virgel Manifold, MD 10/09/14 [phone]

## 2014-10-02 NOTE — ED Notes (Signed)
She has been on antibiotics and she thinks that the med is making her nauseated and she cannot eat

## 2014-10-02 NOTE — ED Notes (Signed)
Attempted to draw pts labs was unsuccessful called phlebotomy and spoke with [facility] phlebotomy to draw pts labs

## 2014-10-02 NOTE — ED Notes (Signed)
The pot has feet and leg swelling  Weakness since yesterday.  Just discharged  From hosp 2-3 days ago.  No pain some sob

## 2014-10-03 ENCOUNTER — Ambulatory Visit: Payer: Medicare Other | Admitting: Nurse Practitioner

## 2014-10-03 ENCOUNTER — Telehealth: Payer: Self-pay | Admitting: Nurse Practitioner

## 2014-10-03 ENCOUNTER — Inpatient Hospital Stay (HOSPITAL_COMMUNITY): Payer: BLUE CROSS/BLUE SHIELD

## 2014-10-03 DIAGNOSIS — I5023 Acute on chronic systolic (congestive) heart failure: Secondary | ICD-10-CM

## 2014-10-03 DIAGNOSIS — Z951 Presence of aortocoronary bypass graft: Secondary | ICD-10-CM

## 2014-10-03 DIAGNOSIS — R630 Anorexia: Secondary | ICD-10-CM | POA: Insufficient documentation

## 2014-10-03 DIAGNOSIS — R531 Weakness: Secondary | ICD-10-CM | POA: Insufficient documentation

## 2014-10-03 LAB — COMPREHENSIVE METABOLIC PANEL
ALBUMIN: 3.2 g/dL — AB (ref 3.5–5.0)
ALT: 379 U/L — AB (ref 14–54)
ANION GAP: 12 (ref 5–15)
AST: 282 U/L — ABNORMAL HIGH (ref 15–41)
Alkaline Phosphatase: 184 U/L — ABNORMAL HIGH (ref 38–126)
BUN: 61 mg/dL — AB (ref 6–20)
CO2: 19 mmol/L — AB (ref 22–32)
Calcium: 8.7 mg/dL — ABNORMAL LOW (ref 8.9–10.3)
Chloride: 106 mmol/L (ref 101–111)
Creatinine, Ser: 2.69 mg/dL — ABNORMAL HIGH (ref 0.44–1.00)
GFR calc Af Amer: 20 mL/min — ABNORMAL LOW (ref >60)
GFR, EST NON AFRICAN AMERICAN: 17 mL/min — AB (ref >60)
Glucose, Bld: 184 mg/dL — ABNORMAL HIGH (ref 65–99)
Potassium: 4.2 mmol/L (ref 3.5–5.1)
Sodium: 137 mmol/L (ref 135–145)
Total Bilirubin: 1.6 mg/dL — ABNORMAL HIGH (ref 0.3–1.2)
Total Protein: 6.2 g/dL — ABNORMAL LOW (ref 6.5–8.1)

## 2014-10-03 LAB — CBC
HCT: 35.1 % — ABNORMAL LOW (ref 36.0–46.0)
Hemoglobin: 11.8 g/dL — ABNORMAL LOW (ref 12.0–15.0)
MCH: 30.9 pg (ref 26.0–34.0)
MCHC: 33.6 g/dL (ref 30.0–36.0)
MCV: 91.9 fL (ref 78.0–100.0)
PLATELETS: 211 10*3/uL (ref 150–400)
RBC: 3.82 MIL/uL — AB (ref 3.87–5.11)
RDW: 15.1 % (ref 11.5–15.5)
WBC: 10.5 10*3/uL (ref 4.0–10.5)

## 2014-10-03 LAB — GLUCOSE, CAPILLARY
GLUCOSE-CAPILLARY: 212 mg/dL — AB (ref 65–99)
GLUCOSE-CAPILLARY: 194 mg/dL — AB (ref 65–99)
Glucose-Capillary: 181 mg/dL — ABNORMAL HIGH (ref 65–99)
Glucose-Capillary: 152 mg/dL — ABNORMAL HIGH (ref 65–99)

## 2014-10-03 LAB — LACTIC ACID, PLASMA
LACTIC ACID, VENOUS: 3.8 mmol/L — AB (ref 0.5–2.0)
Lactic Acid, Venous: 3.3 mmol/L (ref 0.5–2.0)

## 2014-10-03 MED ORDER — SODIUM CHLORIDE 0.9 % IJ SOLN: 3.0000 mL | INTRAMUSCULAR | Status: DC | PRN

## 2014-10-03 MED ORDER — PANTOPRAZOLE SODIUM 40 MG PO TBEC: 40.0000 mg | DELAYED_RELEASE_TABLET | Freq: Every day | ORAL | Status: DC

## 2014-10-03 MED ORDER — GLIMEPIRIDE 2 MG PO TABS: 2.0000 mg | ORAL_TABLET | Freq: Every day | ORAL | Status: DC

## 2014-10-03 MED ORDER — SODIUM CHLORIDE 0.9 % IV SOLN
INTRAVENOUS | Status: AC
  Administered 2014-10-03: 50 mL/h via INTRAVENOUS

## 2014-10-03 MED ORDER — INSULIN ASPART 100 UNIT/ML ~~LOC~~ SOLN
0.0000 [IU] | Freq: Every day | SUBCUTANEOUS | Status: DC
  Administered 2014-10-04 – 2014-10-07 (×4): 2 [IU] via SUBCUTANEOUS

## 2014-10-03 MED ORDER — PANTOPRAZOLE SODIUM 40 MG PO TBEC
40.0000 mg | DELAYED_RELEASE_TABLET | Freq: Every day | ORAL | Status: DC
  Administered 2014-10-03 – 2014-10-08 (×6): 40 mg via ORAL
  Filled 2014-10-03 (×6): qty 1

## 2014-10-03 MED ORDER — INSULIN ASPART 100 UNIT/ML ~~LOC~~ SOLN
0.0000 [IU] | Freq: Three times a day (TID) | SUBCUTANEOUS | Status: DC
  Administered 2014-10-03: 3 [IU] via SUBCUTANEOUS
  Administered 2014-10-04: 2 [IU] via SUBCUTANEOUS

## 2014-10-03 MED ORDER — SODIUM CHLORIDE 0.9 % IJ SOLN
3.0000 mL | Freq: Two times a day (BID) | INTRAMUSCULAR | Status: DC
  Administered 2014-10-03: 3 mL via INTRAVENOUS

## 2014-10-03 MED ORDER — SODIUM CHLORIDE 0.9 % IV SOLN: 250.0000 mL | INTRAVENOUS | Status: DC | PRN

## 2014-10-03 MED ORDER — DIPHENHYDRAMINE HCL 25 MG PO CAPS
25.0000 mg | ORAL_CAPSULE | Freq: Once | ORAL | Status: AC
  Administered 2014-10-03: 25 mg via ORAL
  Filled 2014-10-03: qty 1

## 2014-10-03 MED ORDER — ASPIRIN 81 MG PO CHEW
81.0000 mg | CHEWABLE_TABLET | ORAL | Status: AC
  Administered 2014-10-04: 81 mg via ORAL
  Filled 2014-10-03: qty 1

## 2014-10-03 MED ORDER — SODIUM CHLORIDE 0.9 % IV SOLN
INTRAVENOUS | Status: DC
  Administered 2014-10-04: 06:00:00 via INTRAVENOUS

## 2014-10-03 NOTE — Addendum Note (Signed)
Addended by: Thana Ates on: 10/03/2014 09:26 AM   Modules accepted: Orders

## 2014-10-03 NOTE — Evaluation (Signed)
Physical Therapy Evaluation Patient Details Name: Jo Lynn MRN: [id number] DOB: November 08, 1945 Today's Date: 10/03/2014   History of Present Illness  [name] is a 69 y.o. AA female with past medical history of hypertension, hyperlipidemia, diabetes, CKD stage IV, STEMI s/p PCI and CABG, atrial fibrillation, HFrEF (EF 20-25% in February 2015 who was recently discharged home from this hospital on September 29, 2014 in which she was treated for colitis,  Pt began having increase in swelling in B LE as well as weakness and admitted with CHF exacerbation.  Clinical Impression  Pt admitted with above diagnosis. Pt currently with functional limitations due to the deficits listed below (see PT Problem List).  Pt will benefit from skilled PT to increase their independence and safety with mobility to allow discharge to the venue listed below.  Evaluation was limited due to pt feeling ill after eating her breakfast, but agreeable to get up to recliner.  Feel that once pt is feeling better she should progress well towards returning to her high PLOF which includes working 12 hour shifts. Pt educated on seated therex she could do while up in recliner to prevent more strength loss.  Will continue to assess mobility as she feels better.     Follow Up Recommendations No PT follow up    Equipment Recommendations  None recommended by PT    Recommendations for Other Services       Precautions / Restrictions Precautions Precautions: Fall Restrictions Weight Bearing Restrictions: No      Mobility  Bed Mobility               General bed mobility comments: sitting EOB upon arrival  Transfers Overall transfer level: Needs assistance Equipment used: 1 person hand held assist Transfers: Sit to/from Stand;Stand Pivot Transfers Sit to Stand: Min guard Stand pivot transfers: Min guard       General transfer comment: Pt able to push up but kept flexed posture during transfer due to not  feeling well.  Ambulation/Gait             General Gait Details: NT due to pt feeling poorly.  Stairs            Wheelchair Mobility    Modified Rankin (Stroke Patients Only)       Balance Overall balance assessment: Needs assistance   Sitting balance-Leahy Scale: Good       Standing balance-Leahy Scale: Fair                               Pertinent Vitals/Pain Pain Assessment: No/denies pain o2 97%on RA    Home Living Family/patient expects to be discharged to:: Private residence Living Arrangements: Children Available Help at Discharge: Family;Available PRN/intermittently Type of Home: House Home Access: Stairs to enter Entrance Stairs-Rails: Left Entrance Stairs-Number of Steps: 5 Home Layout: One level Home Equipment: None      Prior Function           Comments: works full time, 12hr shifts      Hand Dominance   Dominant Hand: Right    Extremity/Trunk Assessment   Upper Extremity Assessment: Overall WFL for tasks assessed           Lower Extremity Assessment: Overall WFL for tasks assessed;Generalized weakness         Communication   Communication: No difficulties  Cognition Arousal/Alertness: Awake/alert Behavior During Therapy: WFL for tasks assessed/performed Overall Cognitive Status: Within  Functional Limits for tasks assessed                      General Comments General comments (skin integrity, edema, etc.): Pt feeling ill upon arrival with some SOB noted. o2 97% on RA. Pt stating she had felt fine this AM until she ate and then did not feel well at all.    Exercises General Exercises - Lower Extremity Ankle Circles/Pumps: AROM;Both;10 reps;Seated Long Arc Quad: Strengthening;Both;10 reps;Seated Hip ABduction/ADduction: Strengthening;Both;10 reps;Seated Hip Flexion/Marching: Strengthening;Both;10 reps;Seated      Assessment/Plan    PT Assessment Patient needs continued PT services  PT  Diagnosis Generalized weakness;Difficulty walking   PT Problem List Decreased strength;Decreased activity tolerance;Decreased balance;Decreased mobility  PT Treatment Interventions Gait training;Stair training;Functional mobility training;Therapeutic activities;Therapeutic exercise;Balance training   PT Goals (Current goals can be found in the Care Plan section) Acute Rehab PT Goals Patient Stated Goal: Feel better PT Goal Formulation: With patient Time For Goal Achievement: 10/17/14 Potential to Achieve Goals: Good    Frequency Min 3X/week   Barriers to discharge        Co-evaluation               End of Session   Activity Tolerance: Patient limited by fatigue Patient left: in chair;with call bell/phone within reach;with chair alarm set Nurse Communication: Mobility status (requesting reflux meds)         Time: 0950-1006 PT Time Calculation (min) (ACUTE ONLY): 16 min   Charges:   PT Evaluation $Initial PT Evaluation Tier I: 1 Procedure     PT G Codes:        Randye Treichler LUBECK 10/03/2014, 11:56 AM

## 2014-10-03 NOTE — Progress Notes (Signed)
Pt alert and oriented x 3. Skin warm and dry. Oral mucosa pink and moist.  No sob. Denies pain and discomfort.  Pt did complain about nausea after eating breakfast this morning.  zofran given with very minimal relief.

## 2014-10-03 NOTE — Progress Notes (Signed)
Inpatient Diabetes Program Recommendations  AACE/ADA: New Consensus Statement on Inpatient Glycemic Control (2013)  Target Ranges:  Prepandial:   less than 140 mg/dL      Peak postprandial:   less than 180 mg/dL (1-2 hours)      Critically ill patients:  140 - 180 mg/dL   Results for MERCER, STALLWORTH (MRN [id number]) as of 10/03/2014 09:36  Ref. Range 10/02/2014 21:58 10/03/2014 06:16  Glucose-Capillary Latest Ref Range: 65-99 mg/dL 269 (H) 181 (H)    Diabetes history: DM2 Outpatient Diabetes medications: Amaryl 2 mg QAM, Januvia 25 mg daily Current orders for Inpatient glycemic control: Novolog 0-9 units TID with meals, Novolog 0-5 units HS  Inpatient Diabetes Program Recommendations Insulin - Correction: Please consider increasing Novolog correction to Moderate scale. HgbA1C: Please consider ordering an A1C to evaluate glycemic control over the past 2-3 months.  Thanks, Barnie Alderman, RN, MSN, CCRN, CDE Diabetes Coordinator Inpatient Diabetes Program [phone] (Team Pager from [place] to [place]) [phone] ([place] office) [phone] [facility] office) [phone] [facility] office)

## 2014-10-03 NOTE — Progress Notes (Signed)
Subjective:    The patient was recently discharged home on oral antibiotics for colitis. She states that she feels her new symptoms of weakness and nausea are related to these medications. She states that she does not have any specific complaints, just generalized weakness and lack of appetite. She denies difficulty with SOB or CP. Since her last admission, she has not been eating or drinking anything at home. She reports that she has not had any more episodes of loose stool or diarrhea since her discharge last week. Still complaining of mild abdominal pain that she interprets as "hunger pain."  She also states that her cough of ~4 weeks seems to have worsened over the last two days. She endorses taking Claritin at home without relief. She also endorses taking her PPI regularly at home. She denies any muscle aches or joint pain, and denies any sick contacts.  Interval Events: - Increase in LFTs since last admission (AST 82 --> 259 --> 282; ALT 95 --> 378 --> 379; AlkP 108 --> 192 --> 184; TBili 1.0 --> 1.5 --> 1.6) - Worsening of renal function from baseline ~ 1.4 to 1.8 (SCr 2.24 --> 2.58 --> 2.69) - Persistent Lactic Acidosis (LA 2.4 --> 2.97 --> 2.92) - BNP elevated at 2880 from a baseline of ~409.   Objective:    Vital Signs:   Temp:  [97.5 F (36.4 C)-98.7 F (37.1 C)] 97.5 F (36.4 C) (08/08 1047) Pulse Rate:  [94-103] 101 (08/08 1047) Resp:  [10-26] 18 (08/08 1047) BP: (98-114)/(62-84) 103/62 mmHg (08/08 1047) SpO2:  [94 %-100 %] 100 % (08/08 1047) Weight:  [86.24 kg (190 lb 2 oz)-86.546 kg (190 lb 12.8 oz)] 86.546 kg (190 lb 12.8 oz) (08/08 0515) Last BM Date: 10/02/14  Intake/Output:   Intake/Output Summary (Last 24 hours) at 10/03/14 1208 Last data filed at 10/03/14 0900  Gross per 24 hour  Intake 701.75 ml  Output    200 ml  Net 501.75 ml      Physical Exam: General: Vital signs reviewed and noted. Well-developed, in no acute distress; alert, appropriate and  cooperative throughout examination.  Lungs:  Normal respiratory effort. Clear to auscultation BL without crackles or wheezes.  Heart: RRR. S1 and S2 normal with S4, no murmur, or rubs.  Abdomen:  BS normoactive. Soft, Nondistended, non-tender.  No masses or organomegaly.  Extremities: 1+ pretibial edema.     Labs:  Basic Metabolic Panel:  Recent Labs Lab 09/28/14 0443 09/28/14 1809 09/29/14 0445 10/02/14 1750 10/03/14 0803  NA 141  --  138 135 137  K 4.7  --  4.1 4.5 4.2  CL 103  --  106 103 106  CO2 20*  --  20* 19* 19*  GLUCOSE 317*  --  233* 320* 184*  BUN 70*  --  67* 60* 61*  CREATININE 2.72*  --  2.24* 2.58* 2.69*  CALCIUM 9.8  --  8.8* 9.0 8.7*  PHOS  --  5.2*  --   --   --     Liver Function Tests:  Recent Labs Lab 09/28/14 0443 09/29/14 0445 10/02/14 1750 10/03/14 0803  AST 57* 82* 259* 282*  ALT 70* 95* 378* 379*  ALKPHOS 134* 108 192* 184*  BILITOT 1.3* 1.0 1.5* 1.6*  PROT 7.2 5.9* 6.4* 6.2*  ALBUMIN 4.1 3.2* 3.5 3.2*    Recent Labs Lab 09/28/14 0443 10/02/14 1750  LIPASE 34 50    CBC:  Recent Labs Lab 09/28/14 0443 10/02/14 1750 10/03/14  0502  WBC 10.1 8.9 10.5  NEUTROABS 9.3* 6.8  --   HGB 11.5* 11.4* 11.8*  HCT 34.5* 33.9* 35.1*  MCV 91.8 91.1 91.9  PLT 190 196 211   CBG:  Recent Labs Lab 09/29/14 0747 09/29/14 1158 10/02/14 2158 10/03/14 0616 10/03/14 1124  GLUCAP 289* 283* 269* 181* 212*    Microbiology: Results for orders placed or performed during the hospital encounter of 09/28/14  Urine culture     Status: None   Collection Time: 09/28/14  6:05 AM  Result Value Ref Range Status   Specimen Description URINE, CLEAN CATCH  Final   Special Requests NONE  Final   Culture MULTIPLE SPECIES PRESENT, SUGGEST RECOLLECTION  Final   Report Status 09/29/2014 FINAL  Final    Imaging: Dg Chest 2 View  10/02/2014   CLINICAL DATA:  Patient recently discharged from hospital complaining of shortness of breath and nausea.   EXAM: CHEST  2 VIEW  COMPARISON:  Chest radiograph 06/13/2014  FINDINGS: Single lead AICD device overlies the left hemi thorax. Stable cardiomegaly status post median sternotomy and CABG procedure. No consolidative pulmonary opacities. No pleural effusion or pneumothorax.  IMPRESSION: No acute cardiopulmonary process.   Electronically Signed   By: Lovey Newcomer M.D.   On: 10/02/2014 17:37   US Abdomen Limited Ruq  10/03/2014   CLINICAL DATA:  Transaminitis  EXAM: US ABDOMEN LIMITED - RIGHT UPPER QUADRANT  COMPARISON:  Abdominal CT scan of September 28, 2014 and abdominal ultrasound of April 18, 2014.  FINDINGS: Gallbladder:  Again demonstrated is an echogenic shadowing stone. The gallbladder wall is mildly thickened at 5 mm. There is no pericholecystic fluid or positive sonographic Murphy's sign.  Common bile duct:  Diameter: 3.1 mm  Liver:  No focal lesion identified. Within normal limits in parenchymal echogenicity.  IMPRESSION: There is a large gallstone and there is chronic mild gallbladder wall thickening. There is no sonographic evidence of acute cholecystitis. The liver and common bile duct are normal.   Electronically Signed   By: David  Martinique M.D.   On: 10/03/2014 08:38     Medications:    Infusions:    Scheduled Medications: . apixaban  5 mg Oral BID  . benzonatate  100 mg Oral BID  . carvedilol  3.125 mg Oral BID WC  . fluticasone  2 spray Each Nare Daily  . insulin aspart  0-5 Units Subcutaneous QHS  . insulin aspart  0-9 Units Subcutaneous TID WC  . loratadine  10 mg Oral Daily  . pantoprazole  40 mg Oral QHS  . sodium chloride  3 mL Intravenous Q12H    PRN Medications: ondansetron **OR** ondansetron (ZOFRAN) IV   Assessment/ Plan:    Pt is a 69 y.o. yo female with a PMHx of HTN, CAD (h/o STEMI s/p bare metal stents, CABG and recent balloon angioplasty in 05/2014), uncontrolled t2DM, CKD- 4, and A-Fib who was admitted last week on 09/28/2014 with symptoms of diarrhea and  dehydration, which was determined to be secondary to colitis. She re-presented to the ED last night on 10/02/2014 with concern for increasing weakness and nausea. Interventions at this time will be focused on gentle fluid resuscitation, and interrogation of transaminitis.  Cough - Dry, non-productive cough x 4 weeks. Recent URI not treated w/ abx. No hx of asthma. Remote smoking hx. Recently d/c ARB use in 05/2014. Treated with PPI daily for GERD. Likely 2/2 post-nasal brochitis. On claritan at home. - Continue Claritin - Start Flonase -  Tossinex pearls PRN - If no improvement, consider 1st gen antihistamine  Elevated LFTs - AST 82 --> 259 --> 282; ALT 95 --> 378 --> 379; AlkP 108 --> 192 --> 184; TBili 1.0 --> 1.5 --> 1.6. Cholelithiasis on previous CT and confirmed on RUQ U/S w/o evidence of obstruction or cholecystitis. Negative hepatitis panel 01/2014. Previously elevated in December 2015 and attributed to statin use, which was held at DC. Normal LFTs off statin in 05/2014. Now with LFT elevations on statin, consider statin induced hepatotoxicity.  - d/c Lipitor statin therapy and Allopurinol - Repeat hepatitis panel  Acute on CKD4 - Baseline ~ 1.4 to 1.8. Recently, SCr 2.24 --> 2.58 --> 2.69. BUN/Cr ratio of 23.2 suggesting prerenal etiology. Likely 2/2 dehydration with home Lasix use and poor oral intake. Patient reports she is NOT taking losartan, and this was confirmed with her Cardiologist. Review of EMR is significant for episodes of AKI every ~3-4 months for the last 1.5 years, this makes determination of her baseline difficult and underscores her difficulty with achieving a healthy fluid status given her low EF CHF. - Hold Lasix and ASA. - Received 1453mL NS fluids O/N. - Continue IVF at 75cc/hr for 12hrs, reassess renal function and clinical fluid status.  Cardiomyopathy s/p ICD and CAD with hx of CABG, bare metal stents and balloon angioplasty - Stable. No CP. Mild pedal edema (1+) on  exam, lungs clear. Last Echo: LVEF 55-73%, systolic and grade 3 diastolic dysfunction. Last coronary event: 05/2014 with PTCA angioplasty w/o stent placement. Currently on ASA antiplatelet therapy + Eliquis anticoagulation. BNP of 2880 on admission. Prior BNP >4500 during exacerbations, with a dry baseline suspected to be ~400s. Will need to manage fluid status carefully given poor EF and lab evidence of fluid retention. CXR show clear lungs which were CTAB on PE. LE with 1+ edema which patient endorses as her baseline. - Continue home med: Coreg 3.125mg  BID - Hold ASA as above for AKI - Gentle hydration, monitor for CHF decompensation - Consider repeat 2D Echo for evaluation of interval change in EF and diastolic function  Lactic acidosis: Previously elevated on last admission to 4.3. Unlikely sepsis given lack of SIRS. Possibly ischemic, though etiology is unclear. May be exacerbated by dehydration, will plan to rehydrate and trend LA. -- Repeat Lactic Acid -- IVF as above   Colitis / Abd pain / Nausea - Appears resolved. No diarrhea. Finished 4/5 days of abx therapy. Afeb w/o leukocytosis. Still complaining of "hunger pains," with decreased appetite and nausea today. Vague abd sx may be better explained by dehydration. - Continue regular diet as tolerated. - Encourage oral hydration - Zofran PRN nausea - Protonix qD for dyspepsia  Paroxysmal A-Fib - Stable. Rate controlled. Successfully cardioverted last admission (05/2014) in the setting of STEMI. - Cont Eliquis 5mg  BID w/o adjustment for AKI given expectation of SCr downtrend - Continue home Coreg  t2DM - Uncontrolled, Last HgbA1c 13.7 (05/2014). Home meds: Sitagliptin 100mg  qD and Amaryl 2mg  qD. H/o better control in the past at 5.8. - Recheck A1c - Moderate SSI with meals and QHS - Follow up with PCP for more aggressive outpt therapy   DVT PPX - Eliquis  CODE STATUS - Full  CONSULTS PLACED - None  DISPO - Disposition is deferred  at this time awaiting further w/u of LFT elevations and clinical improvement in renal function.  Anticipated discharge in approximately 1 day(s).   The patient does have a current PCP Chevis Pretty, [place])  and does need an [facility] hospital follow-up appointment after discharge.    Is the [facility] hospital follow-up appointment a one-time only appointment? no.  Does the patient have transportation limitations that hinder transportation to clinic appointments? yes   SERVICE NEEDED AT [place]         Y = Yes, Blank = No PT:   OT:   RN:   Equipment:   Other:      Length of Stay: 1 day(s)   This is a Careers information officer Note.  The care of the patient was discussed with Dr. Redmond Pulling and the assessment and plan formulated with their assistance.  Please see their attached note for official documentation of the daily encounter.  Effie Berkshire, MS4 Pager: [phone] (7AM-5PM) 10/03/2014, 12:08 PM

## 2014-10-03 NOTE — Telephone Encounter (Signed)
Medications refilled and daughter just wanted [place] to know that she is back in the hospital.

## 2014-10-03 NOTE — Consult Note (Signed)
.   Advanced Heart Failure Team Consult Note  Referring Physician: Eppie Gibson  Primary Cardiologist:  Hochrein   HPI:    69 y/o woman with h/o DM2, HTN, CAD s/p CABG 2004 with inferior STEMI in 7/15, PAF, CKD and chronic systolic HF with EF ~67%.  At baseline has been doing very well despite her severe cardiomyopathy working 12 hour days in [employer].   Admitted last week with intermittent abdominal pain, nausea, anorexia. Denied BRBPR. Had CT scan of abdomen which suggested ischemic colitis (versus infectious). Given fluids and abx. Felt better. Was discharged home and returned with recurrent ab pain, marked fatigue, cough, persistent anorexia and orthopnea. Denies weight gain or significant edema. No CP.   Labs notable for Cr 2.7 (was 1.5-2.0 in 4/16), lactate 4.3, elevated LFTS, BNP 2880 (up from 409 in May)     Review of Systems: [y] = yes, [ ]  = no   General: Weight gain [ ] ; Weight loss Blue.Reese ]; Anorexia Blue.Reese ]; Fatigue Blue.Reese ]; Fever [ ] ; Chills [ ] ; Weakness Blue.Reese ]  Cardiac: Chest pain/pressure [ ] ; Resting SOB [ ] ; Exertional SOB Blue.Reese ]; Orthopnea Blue.Reese ]; Pedal Edema [ ] ; Palpitations [ ] ; Syncope [ ] ; Presyncope [ ] ; Paroxysmal nocturnal dyspnea[ ]   Pulmonary: Cough Blue.Reese ]; Wheezing[ ] ; Hemoptysis[ ] ; Sputum [ ] ; Snoring [ ]   GI: Vomiting[ ] ; Dysphagia[ ] ; Melena[ ] ; Hematochezia [ ] ; Heartburn[ ] ; Abdominal pain Blue.Reese ]; Constipation [ ] ; Diarrhea [ ] ; BRBPR [ ]   GU: Hematuria[ ] ; Dysuria [ ] ; Nocturia[ ]   Vascular: Pain in legs with walking [ ] ; Pain in feet with lying flat [ ] ; Non-healing sores [ ] ; Stroke [ ] ; TIA [ ] ; Slurred speech [ ] ;  Neuro: Headaches[ ] ; Vertigo[ ] ; Seizures[ ] ; Paresthesias[ ] ;Blurred vision [ ] ; Diplopia [ ] ; Vision changes [ ]   Ortho/Skin: Arthritis Blue.Reese ]; Joint pain Blue.Reese ]; Muscle pain [ ] ; Joint swelling [ ] ; Back Pain [ ] ; Rash [ ]   Psych: Depression[ ] ; Anxiety[ ]   Heme: Bleeding problems [ ] ; Clotting disorders [ ] ; Anemia [ ]   Endocrine: Diabetes [ y];  Thyroid dysfunction[ ]   Home Medications Prior to Admission medications   Medication Sig Start Date End Date Taking? Authorizing Provider  allopurinol (ZYLOPRIM) 100 MG tablet Take 0.5 tablets (50 mg total) by mouth 2 (two) times daily. 06/16/14  Yes Ripudeep Krystal Eaton, MD  apixaban (ELIQUIS) 5 MG TABS tablet Take 1 tablet (5 mg total) by mouth 2 (two) times daily. 07/12/14  Yes Minus Breeding, MD  aspirin EC 81 MG tablet Take 1 tablet (81 mg total) by mouth daily. 08/03/14  Yes Minus Breeding, MD  atorvastatin (LIPITOR) 40 MG tablet Take 1 tablet (40 mg total) by mouth at bedtime. 07/12/14  Yes Minus Breeding, MD  carvedilol (COREG) 3.125 MG tablet Take 1 tablet (3.125 mg total) by mouth 2 (two) times daily with a meal. 07/12/14  Yes Minus Breeding, MD  furosemide (LASIX) 40 MG tablet Take 1 tablet (40 mg total) by mouth every other day. 07/12/14  Yes Evans Lance, MD  nitroGLYCERIN (NITROSTAT) 0.4 MG SL tablet Place 1 tablet (0.4 mg total) under the tongue every 5 (five) minutes as needed for chest pain. 12/01/13  Yes Minus Breeding, MD  sitaGLIPtin (JANUVIA) 25 MG tablet Take 1 tablet (25 mg total) by mouth daily. 09/29/14  Yes Francesca Oman, DO  fluticasone (FLONASE) 50 MCG/ACT nasal spray Place 2 sprays into both nostrils daily.  Patient not taking: Reported on 10/02/2014 04/28/14   Mary-Margaret Hassell Done, FNP  glimepiride (AMARYL) 2 MG tablet Take 1 tablet (2 mg total) by mouth daily with breakfast. 10/03/14   Mary-Margaret Hassell Done, FNP  lidocaine (LMX) 4 % cream Apply topically daily as needed (foot/leg pain). Patient not taking: Reported on 10/02/2014 06/16/14   Ripudeep Krystal Eaton, MD  pantoprazole (PROTONIX) 40 MG tablet Take 1 tablet (40 mg total) by mouth at bedtime. 10/03/14   Mary-Margaret Hassell Done, FNP    Past Medical History: Past Medical History  Diagnosis Date  . Myocardial infarction 10/2002    MV CAD --> Referred for CABG  . 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 Lucianne Lei Trigt: LIMA-LAD, SVG-RPDA, SVG-OM1-OM2  . ST elevation myocardial infarction (STEMI) of inferolateral wall 2015, 2016    SVG-Om1-2 occluded - PCI in 2 locations; Occluded SVG-RCA & native RCA, LAD & native Cx occcluded.  EF ~20%.  SVG to OM with 99% stenosis treated with balloon angioplasty.  05/2024.   . Cardiomyopathy, ischemic 09/09/2013    EF ~20-25%; Large MI, existing occluded RCA & SVG-RCA; h/o CABG  . 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 CAD S/P percutaneous coronary angioplasty 09/09/2013    mid SVG-OM1-OM2 (prox LIMB) Rebel BMS 4.0 mm x 16 mm; sequential limb OM1-OM2: Rebel BMS 4.0 mm x 12 mm  . Combined systolic and diastolic congestive heart failure, NYHA class 3 09/09/2013    Echo post Inferolateral STEMI: Severely reduced LVEF ~20-35%; Akinesis of Inferolateral, Inferior & Inferoseptal walls; Grade 1 DDysfxn (initial LVEDP ~26 mmHg with SBP of 99 mHg)  . Essential hypertension 08/17/2012  . Hyperlipidemia with target LDL less than 70 08/17/2012  . Diabetes mellitus type 2 with complications 10/19/537  . 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 Lovena Le  . Colitis     Past Surgical History: Past Surgical History  Procedure Laterality Date  . Coronary artery bypass graft    . Left heart catheterization with coronary angiogram N/A 09/09/2013    Procedure: LEFT HEART CATHETERIZATION WITH CORONARY ANGIOGRAM;  Surgeon: Leonie Man, MD;  Location: [facility] CATH LAB;  Service: Cardiovascular;  Laterality: N/A;  . Implantable cardioverter defibrillator implant  02-21-2014    STJ single chamber ICD implanted by Dr Lovena Le for  primary prevention  . Implantable cardioverter defibrillator implant N/A 02/21/2014    Procedure: IMPLANTABLE CARDIOVERTER DEFIBRILLATOR IMPLANT;  Surgeon: Evans Lance, MD;  Location: [facility] CATH LAB;  Service: Cardiovascular;  Laterality: N/A;  . Left heart catheterization with coronary angiogram N/A 06/13/2014    Procedure: LEFT HEART CATHETERIZATION WITH CORONARY ANGIOGRAM;  Surgeon: Jettie Booze, MD;  Location: [facility] CATH LAB;  Service: Cardiovascular;  Laterality: N/A;    Family History: Family History  Problem Relation Age of Onset  . Diabetes Mother   . Diabetes Father   . Heart attack Neg Hx   . Stroke Neg Hx     Social History: History   Social History  . Marital Status: Single    Spouse Name: N/A  .  Number of Children: 4  . Years of Education: N/A   Social History Main Topics  . Smoking status: Former Smoker    Types: Cigarettes  . Smokeless tobacco: Never Used     Comment: quit many years ago  . Alcohol Use: No  . Drug Use: No  . Sexual Activity: Not on file   Other Topics Concern  . None   Social History Narrative    Allergies:  Allergies  Allergen Reactions  . Prednisone     "Did not feel good at all"    Objective:    Vital Signs:   Temp:  [97.5 F (36.4 C)-98.7 F (37.1 C)] 97.6 F (36.4 C) (08/08 1407) Pulse Rate:  [96-103] 98 (08/08 1407) Resp:  [17-26] 17 (08/08 1407) BP: (98-114)/(62-84) 106/71 mmHg (08/08 1407) SpO2:  [94 %-100 %] 100 % (08/08 1407) Weight:  [86.546 kg (190 lb 12.8 oz)] 86.546 kg (190 lb 12.8 oz) (08/08 0515) Last BM Date: 10/02/14  Weight change: Filed Weights   10/02/14 1633 10/03/14 0515  Weight: 86.24 kg (190 lb 2 oz) 86.546 kg (190 lb 12.8 oz)    Intake/Output:   Intake/Output Summary (Last 24 hours) at 10/03/14 1843 Last data filed at 10/03/14 1819  Gross per 24 hour  Intake 701.75 ml  Output    200 ml  Net 501.75 ml     Physical Exam: General:  Elderly woman Sitting in chair. Fatigued appearing.  No resp difficulty  Daughter present HEENT: normal Neck: supple. JVP 7 . Carotids 2+ bilat; no bruits. No lymphadenopathy or thryomegaly appreciated. Cor: PMI laterally displaced. Regular Tachy. +s3 Lungs: clear Abdomen: soft, nontender, nondistended. No hepatosplenomegaly. No bruits or masses. Good bowel sounds. Extremities: no cyanosis, clubbing, rash, edema Neuro: alert & orientedx3, cranial nerves grossly intact. moves all 4 extremities w/o difficulty. Affect pleasant  Telemetry: SR/STach ~ 100 with occasional PVCs  Labs: Basic Metabolic Panel:  Recent Labs Lab 09/28/14 0443 09/28/14 1809 09/29/14 0445 10/02/14 1750 10/03/14 0803  NA 141  --  138 135 137  K 4.7  --  4.1 4.5 4.2  CL 103  --  106 103 106  CO2 20*  --  20* 19* 19*  GLUCOSE 317*  --  233* 320* 184*  BUN 70*  --  67* 60* 61*  CREATININE 2.72*  --  2.24* 2.58* 2.69*  CALCIUM 9.8  --  8.8* 9.0 8.7*  PHOS  --  5.2*  --   --   --     Liver Function Tests:  Recent Labs Lab 09/28/14 0443 09/29/14 0445 10/02/14 1750 10/03/14 0803  AST 57* 82* 259* 282*  ALT 70* 95* 378* 379*  ALKPHOS 134* 108 192* 184*  BILITOT 1.3* 1.0 1.5* 1.6*  PROT 7.2 5.9* 6.4* 6.2*  ALBUMIN 4.1 3.2* 3.5 3.2*    Recent Labs Lab 09/28/14 0443 10/02/14 1750  LIPASE 34 50   No results for input(s): AMMONIA in the last 168 hours.  CBC:  Recent Labs Lab 09/28/14 0443 10/02/14 1750 10/03/14 0502  WBC 10.1 8.9 10.5  NEUTROABS 9.3* 6.8  --   HGB 11.5* 11.4* 11.8*  HCT 34.5* 33.9* 35.1*  MCV 91.8 91.1 91.9  PLT 190 196 211    Cardiac Enzymes:  Recent Labs Lab 10/02/14 1749  TROPONINI <0.03    BNP: BNP (last 3 results)  Recent Labs  04/18/14 1302 06/13/14 0349 10/02/14 1750  BNP >4500.0* 409.6* 2880.4*    ProBNP (last 3 results) No results  for input(s): PROBNP in the last 8760 hours.   CBG:  Recent Labs Lab 09/29/14 1158 10/02/14 2158 10/03/14 0616 10/03/14 1124 10/03/14 1617  GLUCAP 283* 269*  181* 212* 194*    Coagulation Studies: No results for input(s): LABPROT, INR in the last 72 hours.  Other results: EKG: NSR 98 + PVCs  Imaging: Dg Chest 2 View  10/02/2014   CLINICAL DATA:  Patient recently discharged from hospital complaining of shortness of breath and nausea.  EXAM: CHEST  2 VIEW  COMPARISON:  Chest radiograph 06/13/2014  FINDINGS: Single lead AICD device overlies the left hemi thorax. Stable cardiomegaly status post median sternotomy and CABG procedure. No consolidative pulmonary opacities. No pleural effusion or pneumothorax.  IMPRESSION: No acute cardiopulmonary process.   Electronically Signed   By: Lovey Newcomer M.D.   On: 10/02/2014 17:37   US Abdomen Limited Ruq  10/03/2014   CLINICAL DATA:  Transaminitis  EXAM: US ABDOMEN LIMITED - RIGHT UPPER QUADRANT  COMPARISON:  Abdominal CT scan of September 28, 2014 and abdominal ultrasound of April 18, 2014.  FINDINGS: Gallbladder:  Again demonstrated is an echogenic shadowing stone. The gallbladder wall is mildly thickened at 5 mm. There is no pericholecystic fluid or positive sonographic Murphy's sign.  Common bile duct:  Diameter: 3.1 mm  Liver:  No focal lesion identified. Within normal limits in parenchymal echogenicity.  IMPRESSION: There is a large gallstone and there is chronic mild gallbladder wall thickening. There is no sonographic evidence of acute cholecystitis. The liver and common bile duct are normal.   Electronically Signed   By: David  Martinique M.D.   On: 10/03/2014 08:38      Medications:     Current Medications: . apixaban  5 mg Oral BID  . benzonatate  100 mg Oral BID  . carvedilol  3.125 mg Oral BID WC  . diphenhydrAMINE  25 mg Oral Once  . fluticasone  2 spray Each Nare Daily  . insulin aspart  0-15 Units Subcutaneous TID WC  . insulin aspart  0-5 Units Subcutaneous QHS  . loratadine  10 mg Oral Daily  . pantoprazole  40 mg Oral Daily  . sodium chloride  3 mL Intravenous Q12H     Infusions: .  sodium chloride 50 mL/hr (10/03/14 1430)      Assessment:   1. Fatigue/lactic acidosis 2. Acute on chronic systolic HF    --EF 19% 3. Recent ischemic colitis 4. CAD s/p CABG 2004 5. Acute on chronic renal failure, stage 4 6. Transaminitis  Plan/Discussion:    I think all her findings point toward low output heart failure with multi-organ hypoperfusion (probable cold-dry physiology). I discussed this at length with her and her daughter. We discussed role of potential RHC to further evalaute and they agree. Will plan RHC first case in am. Discussed with Dr. Eppie Gibson. Will hold apixaban tonight and in am.   Length of Stay: 1  Bensimhon, Daniel,MD 6:56 PM   Advanced Heart Failure Team Pager [phone] (M-F; 7a - 4p)  Please contact [facility] for night-coverage after hours (4p -7a ) and weekends on [email or web address]

## 2014-10-03 NOTE — Progress Notes (Signed)
Received from ED via stretcher accompanied by er staff and daughter.  Patient alert and oriented x 4, no complaints of chest pains or sob, pt on room air.  Oriented pt and daughter to unit routines and policies.  Instructed to call for concerns.  Instructed on plan of care.  Will monitor and evaluate at intervals.

## 2014-10-03 NOTE — Progress Notes (Addendum)
CRITICAL VALUE ALERT  Critical value received:  Lactic Acid 3.8 Date of notification: 10/03/2014 Time of notification:  1625  Critical value read back:yes  Nurse who received alert: Sherene Sires  MD notified (1st page):  Holley Raring  Time of first page:  1620  MD notified (2nd page):  Time of second page:  Responding MD:   Time MD responded:

## 2014-10-03 NOTE — Telephone Encounter (Signed)
Leave message for patient to call back

## 2014-10-04 ENCOUNTER — Inpatient Hospital Stay (HOSPITAL_COMMUNITY): Payer: BLUE CROSS/BLUE SHIELD

## 2014-10-04 ENCOUNTER — Encounter (HOSPITAL_COMMUNITY): Payer: Self-pay | Admitting: Internal Medicine

## 2014-10-04 ENCOUNTER — Encounter (HOSPITAL_COMMUNITY)
Admission: EM | Disposition: A | Discharge status: Home / Self Care | Payer: Self-pay | Source: Home / Self Care | Attending: Internal Medicine

## 2014-10-04 DIAGNOSIS — I5021 Acute systolic (congestive) heart failure: Secondary | ICD-10-CM | POA: Diagnosis present

## 2014-10-04 HISTORY — PX: CARDIAC CATHETERIZATION: SHX172

## 2014-10-04 LAB — GLUCOSE, CAPILLARY
GLUCOSE-CAPILLARY: 147 mg/dL — AB (ref 65–99)
Glucose-Capillary: 166 mg/dL — ABNORMAL HIGH (ref 65–99)
Glucose-Capillary: 217 mg/dL — ABNORMAL HIGH (ref 65–99)
Glucose-Capillary: 212 mg/dL — ABNORMAL HIGH (ref 65–99)

## 2014-10-04 LAB — MRSA PCR SCREENING: MRSA BY PCR: NEGATIVE

## 2014-10-04 LAB — APTT: aPTT: 35 seconds (ref 24–37)

## 2014-10-04 LAB — HEPARIN LEVEL (UNFRACTIONATED)

## 2014-10-04 LAB — HEMOGLOBIN A1C
HEMOGLOBIN A1C: 8.7 % — AB (ref 4.8–5.6)
Mean Plasma Glucose: 203 mg/dL

## 2014-10-04 LAB — HEPATITIS PANEL, ACUTE
HCV Ab: <0.1 s/co ratio (ref 0.0–0.9)
Hep A IgM: NEGATIVE
Hep B C IgM: NEGATIVE
Hepatitis B Surface Ag: NEGATIVE

## 2014-10-04 SURGERY — RIGHT HEART CATH
Anesthesia: LOCAL

## 2014-10-04 MED ORDER — SODIUM CHLORIDE 0.9 % IV SOLN: 250.0000 mL | INTRAVENOUS | Status: DC | PRN

## 2014-10-04 MED ORDER — SODIUM CHLORIDE 0.9 % IJ SOLN
3.0000 mL | INTRAMUSCULAR | Status: DC | PRN
Start: 2014-10-04 — End: 2014-10-05

## 2014-10-04 MED ORDER — ACETAMINOPHEN 325 MG PO TABS
650.0000 mg | ORAL_TABLET | ORAL | Status: DC | PRN
  Administered 2014-10-04 – 2014-10-07 (×3): 650 mg via ORAL
  Filled 2014-10-04 (×3): qty 2

## 2014-10-04 MED ORDER — SODIUM CHLORIDE 0.9 % IJ SOLN
3.0000 mL | Freq: Two times a day (BID) | INTRAMUSCULAR | Status: DC
  Administered 2014-10-04 (×2): 3 mL via INTRAVENOUS

## 2014-10-04 MED ORDER — HEPARIN (PORCINE) IN NACL 100-0.45 UNIT/ML-% IJ SOLN
1350.0000 [IU]/h | INTRAMUSCULAR | Status: DC
  Administered 2014-10-04: 900 [IU]/h via INTRAVENOUS
  Administered 2014-10-05: 1050 [IU]/h via INTRAVENOUS
  Filled 2014-10-04 (×6): qty 250

## 2014-10-04 MED ORDER — INSULIN ASPART 100 UNIT/ML ~~LOC~~ SOLN
0.0000 [IU] | Freq: Three times a day (TID) | SUBCUTANEOUS | Status: DC
  Administered 2014-10-04: 2 [IU] via SUBCUTANEOUS
  Administered 2014-10-04: 5 [IU] via SUBCUTANEOUS
  Administered 2014-10-05 – 2014-10-06 (×3): 3 [IU] via SUBCUTANEOUS
  Administered 2014-10-06: 5 [IU] via SUBCUTANEOUS
  Administered 2014-10-07: 11 [IU] via SUBCUTANEOUS
  Administered 2014-10-07: 3 [IU] via SUBCUTANEOUS
  Administered 2014-10-08: 5 [IU] via SUBCUTANEOUS
  Administered 2014-10-08: 8 [IU] via SUBCUTANEOUS

## 2014-10-04 MED ORDER — SODIUM CHLORIDE 0.9 % IV SOLN
INTRAVENOUS | Status: DC
  Administered 2014-10-07: 10:00:00 via INTRAVENOUS

## 2014-10-04 MED ORDER — SODIUM CHLORIDE 0.9 % IJ SOLN: 3.0000 mL | INTRAMUSCULAR | Status: DC | PRN

## 2014-10-04 MED ORDER — SODIUM CHLORIDE 0.9 % IJ SOLN: 3.0000 mL | Freq: Two times a day (BID) | INTRAMUSCULAR | Status: DC

## 2014-10-04 MED ORDER — MILRINONE IN DEXTROSE 20 MG/100ML IV SOLN
0.2500 ug/kg/min | INTRAVENOUS | Status: DC
  Administered 2014-10-04 – 2014-10-08 (×6): 0.25 ug/kg/min via INTRAVENOUS
  Filled 2014-10-04 (×7): qty 100

## 2014-10-04 MED ORDER — LIDOCAINE HCL (PF) 1 % IJ SOLN
INTRAMUSCULAR | Status: AC
  Filled 2014-10-04: qty 30

## 2014-10-04 MED ORDER — ONDANSETRON HCL 4 MG/2ML IJ SOLN: 4.0000 mg | Freq: Four times a day (QID) | INTRAMUSCULAR | Status: DC | PRN

## 2014-10-04 SURGICAL SUPPLY — 21 items
CATH BALLN WEDGE 5F 110CM (CATHETERS) ×1 IMPLANT
CATH INFINITI 5FR ANG PIGTAIL (CATHETERS) ×1 IMPLANT
CATH INFINITI 5FR MULTPACK ANG (CATHETERS) IMPLANT
CATH INFINITI JR4 5F (CATHETERS) ×1 IMPLANT
CATH SWAN GANZ 7F STRAIGHT (CATHETERS) ×1 IMPLANT
COVER PRB 48X5XTLSCP FOLD TPE (BAG) IMPLANT
COVER PROBE 5X48 (BAG) ×2
DEVICE RAD COMP TR BAND LRG (VASCULAR PRODUCTS) ×1 IMPLANT
GLIDESHEATH SLEND SS 6F .021 (SHEATH) ×1 IMPLANT
KIT HEART LEFT (KITS) ×1 IMPLANT
KIT HEART RIGHT NAMIC (KITS) ×2 IMPLANT
PACK CARDIAC CATHETERIZATION (CUSTOM PROCEDURE TRAY) ×2 IMPLANT
SHEATH FAST CATH BRACH 5F 5CM (SHEATH) ×1 IMPLANT
SHEATH PINNACLE 5F 10CM (SHEATH) IMPLANT
SHEATH PINNACLE 7F 10CM (SHEATH) ×1 IMPLANT
SLEEVE REPOSITIONING LENGTH 30 (MISCELLANEOUS) ×1 IMPLANT
SYR MEDRAD MARK V 150ML (SYRINGE) ×1 IMPLANT
TRANSDUCER W/STOPCOCK (MISCELLANEOUS) ×3 IMPLANT
TUBING CIL FLEX 10 FLL-RA (TUBING) ×1 IMPLANT
WIRE EMERALD 3MM-J .035X150CM (WIRE) IMPLANT
WIRE SAFE-T 1.5MM-J .035X260CM (WIRE) ×1 IMPLANT

## 2014-10-04 NOTE — Progress Notes (Signed)
ANTICOAGULATION CONSULT NOTE - Initial Consult  Pharmacy Consult for heparin Indication: atrial fibrillation  Allergies  Allergen Reactions  . Prednisone     "Did not feel good at all"    Patient Measurements: Height: 5\' 7"  (170.2 cm) Weight: 190 lb 12.8 oz (86.546 kg) IBW/kg (Calculated) : 61.6 Heparin Dosing Weight: 80kg  Vital Signs: Temp: 98.1 F (36.7 C) (08/09 0947) Temp Source: Core (Comment) (08/09 0945) BP: 90/57 mmHg (08/09 0947) Pulse Rate: 82 (08/09 0947)  Labs:  Recent Labs  10/02/14 1749 10/02/14 1750 10/03/14 0502 10/03/14 0803  HGB  --  11.4* 11.8*  --   HCT  --  33.9* 35.1*  --   PLT  --  196 211  --   CREATININE  --  2.58*  --  2.69*  TROPONINI <0.03  --   --   --     Estimated Creatinine Clearance: 22 mL/min (by C-G formula based on Cr of 2.69).   Medical History: Past Medical History  Diagnosis Date  . Myocardial infarction 10/2002    MV CAD --> Referred for CABG  . 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 Lucianne Lei Trigt: LIMA-LAD, SVG-RPDA, SVG-OM1-OM2  . ST elevation myocardial infarction (STEMI) of inferolateral wall 2015, 2016    SVG-Om1-2 occluded - PCI in 2 locations; Occluded SVG-RCA & native RCA, LAD & native Cx occcluded.  EF ~20%.  SVG to OM with 99% stenosis treated with balloon angioplasty.  05/2024.   . Cardiomyopathy, ischemic 09/09/2013    EF ~20-25%; Large MI, existing occluded RCA & SVG-RCA; h/o CABG  . 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 CAD S/P percutaneous coronary angioplasty 09/09/2013    mid SVG-OM1-OM2 (prox LIMB) Rebel BMS 4.0 mm x 16 mm; sequential limb OM1-OM2:  Rebel BMS 4.0 mm x 12 mm  . Combined systolic and diastolic congestive heart failure, NYHA class 3 09/09/2013    Echo post Inferolateral STEMI: Severely reduced LVEF ~20-35%; Akinesis of Inferolateral, Inferior & Inferoseptal walls; Grade 1 DDysfxn (initial LVEDP ~26 mmHg with SBP of 99 mHg)  . Essential hypertension 08/17/2012  . Hyperlipidemia with target LDL less than 70 08/17/2012  . Diabetes mellitus type 2 with complications 3/89/3734  . 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 Lovena Le  . Colitis     Assessment: 69 year old female with PAF pta on eliquis last dose 8/8am. Patient now s/p RHC with low output to be bridged on IV heparin, swan planced, milrinone to start.  No bleeding/hematoma complications noted with cath, hgb has been stable. Sheath not removed as swan was placed, will start heparin 8h post cath.   Given recent apixaban use will rely on aptt for anticoagulation monitoring.  Goal of Therapy:  Heparin level 0.3-0.7 units/ml aPTT 66-102 seconds Monitor platelets by anticoagulation protocol: Yes   Plan:  Start heparin at 900 units/hr - no bolus given post cath Check HL/aptt 8h  Daily HL/aptt  Erin Hearing PharmD., BCPS Clinical Pharmacist Pager [phone] 10/04/2014 11:35 AM

## 2014-10-04 NOTE — H&P (View-Only) (Signed)
.   Advanced Heart Failure Team Consult Note  Referring Physician: Eppie Gibson  Primary Cardiologist:  Hochrein   HPI:    69 y/o woman with h/o DM2, HTN, CAD s/p CABG 2004 with inferior STEMI in 7/15, PAF, CKD and chronic systolic HF with EF ~41%.  At baseline has been doing very well despite her severe cardiomyopathy working 12 hour days in [employer].   Admitted last week with intermittent abdominal pain, nausea, anorexia. Denied BRBPR. Had CT scan of abdomen which suggested ischemic colitis (versus infectious). Given fluids and abx. Felt better. Was discharged home and returned with recurrent ab pain, marked fatigue, cough, persistent anorexia and orthopnea. Denies weight gain or significant edema. No CP.   Labs notable for Cr 2.7 (was 1.5-2.0 in 4/16), lactate 4.3, elevated LFTS, BNP 2880 (up from 409 in May)     Review of Systems: [y] = yes, [ ]  = no   General: Weight gain [ ] ; Weight loss Blue.Reese ]; Anorexia Blue.Reese ]; Fatigue Blue.Reese ]; Fever [ ] ; Chills [ ] ; Weakness Blue.Reese ]  Cardiac: Chest pain/pressure [ ] ; Resting SOB [ ] ; Exertional SOB Blue.Reese ]; Orthopnea Blue.Reese ]; Pedal Edema [ ] ; Palpitations [ ] ; Syncope [ ] ; Presyncope [ ] ; Paroxysmal nocturnal dyspnea[ ]   Pulmonary: Cough Blue.Reese ]; Wheezing[ ] ; Hemoptysis[ ] ; Sputum [ ] ; Snoring [ ]   GI: Vomiting[ ] ; Dysphagia[ ] ; Melena[ ] ; Hematochezia [ ] ; Heartburn[ ] ; Abdominal pain Blue.Reese ]; Constipation [ ] ; Diarrhea [ ] ; BRBPR [ ]   GU: Hematuria[ ] ; Dysuria [ ] ; Nocturia[ ]   Vascular: Pain in legs with walking [ ] ; Pain in feet with lying flat [ ] ; Non-healing sores [ ] ; Stroke [ ] ; TIA [ ] ; Slurred speech [ ] ;  Neuro: Headaches[ ] ; Vertigo[ ] ; Seizures[ ] ; Paresthesias[ ] ;Blurred vision [ ] ; Diplopia [ ] ; Vision changes [ ]   Ortho/Skin: Arthritis Blue.Reese ]; Joint pain Blue.Reese ]; Muscle pain [ ] ; Joint swelling [ ] ; Back Pain [ ] ; Rash [ ]   Psych: Depression[ ] ; Anxiety[ ]   Heme: Bleeding problems [ ] ; Clotting disorders [ ] ; Anemia [ ]   Endocrine: Diabetes [ y];  Thyroid dysfunction[ ]   Home Medications Prior to Admission medications   Medication Sig Start Date End Date Taking? Authorizing Provider  allopurinol (ZYLOPRIM) 100 MG tablet Take 0.5 tablets (50 mg total) by mouth 2 (two) times daily. 06/16/14  Yes Ripudeep Krystal Eaton, MD  apixaban (ELIQUIS) 5 MG TABS tablet Take 1 tablet (5 mg total) by mouth 2 (two) times daily. 07/12/14  Yes Minus Breeding, MD  aspirin EC 81 MG tablet Take 1 tablet (81 mg total) by mouth daily. 08/03/14  Yes Minus Breeding, MD  atorvastatin (LIPITOR) 40 MG tablet Take 1 tablet (40 mg total) by mouth at bedtime. 07/12/14  Yes Minus Breeding, MD  carvedilol (COREG) 3.125 MG tablet Take 1 tablet (3.125 mg total) by mouth 2 (two) times daily with a meal. 07/12/14  Yes Minus Breeding, MD  furosemide (LASIX) 40 MG tablet Take 1 tablet (40 mg total) by mouth every other day. 07/12/14  Yes Evans Lance, MD  nitroGLYCERIN (NITROSTAT) 0.4 MG SL tablet Place 1 tablet (0.4 mg total) under the tongue every 5 (five) minutes as needed for chest pain. 12/01/13  Yes Minus Breeding, MD  sitaGLIPtin (JANUVIA) 25 MG tablet Take 1 tablet (25 mg total) by mouth daily. 09/29/14  Yes Francesca Oman, DO  fluticasone (FLONASE) 50 MCG/ACT nasal spray Place 2 sprays into both nostrils daily.  Patient not taking: Reported on 10/02/2014 04/28/14   Mary-Margaret Hassell Done, FNP  glimepiride (AMARYL) 2 MG tablet Take 1 tablet (2 mg total) by mouth daily with breakfast. 10/03/14   Mary-Margaret Hassell Done, FNP  lidocaine (LMX) 4 % cream Apply topically daily as needed (foot/leg pain). Patient not taking: Reported on 10/02/2014 06/16/14   Ripudeep Krystal Eaton, MD  pantoprazole (PROTONIX) 40 MG tablet Take 1 tablet (40 mg total) by mouth at bedtime. 10/03/14   Mary-Margaret Hassell Done, FNP    Past Medical History: Past Medical History  Diagnosis Date  . Myocardial infarction 10/2002    MV CAD --> Referred for CABG  . 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 Lucianne Lei Trigt: LIMA-LAD, SVG-RPDA, SVG-OM1-OM2  . ST elevation myocardial infarction (STEMI) of inferolateral wall 2015, 2016    SVG-Om1-2 occluded - PCI in 2 locations; Occluded SVG-RCA & native RCA, LAD & native Cx occcluded.  EF ~20%.  SVG to OM with 99% stenosis treated with balloon angioplasty.  05/2024.   . Cardiomyopathy, ischemic 09/09/2013    EF ~20-25%; Large MI, existing occluded RCA & SVG-RCA; h/o CABG  . 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 CAD S/P percutaneous coronary angioplasty 09/09/2013    mid SVG-OM1-OM2 (prox LIMB) Rebel BMS 4.0 mm x 16 mm; sequential limb OM1-OM2: Rebel BMS 4.0 mm x 12 mm  . Combined systolic and diastolic congestive heart failure, NYHA class 3 09/09/2013    Echo post Inferolateral STEMI: Severely reduced LVEF ~20-35%; Akinesis of Inferolateral, Inferior & Inferoseptal walls; Grade 1 DDysfxn (initial LVEDP ~26 mmHg with SBP of 99 mHg)  . Essential hypertension 08/17/2012  . Hyperlipidemia with target LDL less than 70 08/17/2012  . Diabetes mellitus type 2 with complications 5/36/6440  . 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 Lovena Le  . Colitis     Past Surgical History: Past Surgical History  Procedure Laterality Date  . Coronary artery bypass graft    . Left heart catheterization with coronary angiogram N/A 09/09/2013    Procedure: LEFT HEART CATHETERIZATION WITH CORONARY ANGIOGRAM;  Surgeon: Leonie Man, MD;  Location: [facility] CATH LAB;  Service: Cardiovascular;  Laterality: N/A;  . Implantable cardioverter defibrillator implant  02-21-2014    STJ single chamber ICD implanted by Dr Lovena Le for  primary prevention  . Implantable cardioverter defibrillator implant N/A 02/21/2014    Procedure: IMPLANTABLE CARDIOVERTER DEFIBRILLATOR IMPLANT;  Surgeon: Evans Lance, MD;  Location: [facility] CATH LAB;  Service: Cardiovascular;  Laterality: N/A;  . Left heart catheterization with coronary angiogram N/A 06/13/2014    Procedure: LEFT HEART CATHETERIZATION WITH CORONARY ANGIOGRAM;  Surgeon: Jettie Booze, MD;  Location: [facility] CATH LAB;  Service: Cardiovascular;  Laterality: N/A;    Family History: Family History  Problem Relation Age of Onset  . Diabetes Mother   . Diabetes Father   . Heart attack Neg Hx   . Stroke Neg Hx     Social History: History   Social History  . Marital Status: Single    Spouse Name: N/A  .  Number of Children: 4  . Years of Education: N/A   Social History Main Topics  . Smoking status: Former Smoker    Types: Cigarettes  . Smokeless tobacco: Never Used     Comment: quit many years ago  . Alcohol Use: No  . Drug Use: No  . Sexual Activity: Not on file   Other Topics Concern  . None   Social History Narrative    Allergies:  Allergies  Allergen Reactions  . Prednisone     "Did not feel good at all"    Objective:    Vital Signs:   Temp:  [97.5 F (36.4 C)-98.7 F (37.1 C)] 97.6 F (36.4 C) (08/08 1407) Pulse Rate:  [96-103] 98 (08/08 1407) Resp:  [17-26] 17 (08/08 1407) BP: (98-114)/(62-84) 106/71 mmHg (08/08 1407) SpO2:  [94 %-100 %] 100 % (08/08 1407) Weight:  [86.546 kg (190 lb 12.8 oz)] 86.546 kg (190 lb 12.8 oz) (08/08 0515) Last BM Date: 10/02/14  Weight change: Filed Weights   10/02/14 1633 10/03/14 0515  Weight: 86.24 kg (190 lb 2 oz) 86.546 kg (190 lb 12.8 oz)    Intake/Output:   Intake/Output Summary (Last 24 hours) at 10/03/14 1843 Last data filed at 10/03/14 1819  Gross per 24 hour  Intake 701.75 ml  Output    200 ml  Net 501.75 ml     Physical Exam: General:  Elderly woman Sitting in chair. Fatigued appearing.  No resp difficulty  Daughter present HEENT: normal Neck: supple. JVP 7 . Carotids 2+ bilat; no bruits. No lymphadenopathy or thryomegaly appreciated. Cor: PMI laterally displaced. Regular Tachy. +s3 Lungs: clear Abdomen: soft, nontender, nondistended. No hepatosplenomegaly. No bruits or masses. Good bowel sounds. Extremities: no cyanosis, clubbing, rash, edema Neuro: alert & orientedx3, cranial nerves grossly intact. moves all 4 extremities w/o difficulty. Affect pleasant  Telemetry: SR/STach ~ 100 with occasional PVCs  Labs: Basic Metabolic Panel:  Recent Labs Lab 09/28/14 0443 09/28/14 1809 09/29/14 0445 10/02/14 1750 10/03/14 0803  NA 141  --  138 135 137  K 4.7  --  4.1 4.5 4.2  CL 103  --  106 103 106  CO2 20*  --  20* 19* 19*  GLUCOSE 317*  --  233* 320* 184*  BUN 70*  --  67* 60* 61*  CREATININE 2.72*  --  2.24* 2.58* 2.69*  CALCIUM 9.8  --  8.8* 9.0 8.7*  PHOS  --  5.2*  --   --   --     Liver Function Tests:  Recent Labs Lab 09/28/14 0443 09/29/14 0445 10/02/14 1750 10/03/14 0803  AST 57* 82* 259* 282*  ALT 70* 95* 378* 379*  ALKPHOS 134* 108 192* 184*  BILITOT 1.3* 1.0 1.5* 1.6*  PROT 7.2 5.9* 6.4* 6.2*  ALBUMIN 4.1 3.2* 3.5 3.2*    Recent Labs Lab 09/28/14 0443 10/02/14 1750  LIPASE 34 50   No results for input(s): AMMONIA in the last 168 hours.  CBC:  Recent Labs Lab 09/28/14 0443 10/02/14 1750 10/03/14 0502  WBC 10.1 8.9 10.5  NEUTROABS 9.3* 6.8  --   HGB 11.5* 11.4* 11.8*  HCT 34.5* 33.9* 35.1*  MCV 91.8 91.1 91.9  PLT 190 196 211    Cardiac Enzymes:  Recent Labs Lab 10/02/14 1749  TROPONINI <0.03    BNP: BNP (last 3 results)  Recent Labs  04/18/14 1302 06/13/14 0349 10/02/14 1750  BNP >4500.0* 409.6* 2880.4*    ProBNP (last 3 results) No results  for input(s): PROBNP in the last 8760 hours.   CBG:  Recent Labs Lab 09/29/14 1158 10/02/14 2158 10/03/14 0616 10/03/14 1124 10/03/14 1617  GLUCAP 283* 269*  181* 212* 194*    Coagulation Studies: No results for input(s): LABPROT, INR in the last 72 hours.  Other results: EKG: NSR 98 + PVCs  Imaging: Dg Chest 2 View  10/02/2014   CLINICAL DATA:  Patient recently discharged from hospital complaining of shortness of breath and nausea.  EXAM: CHEST  2 VIEW  COMPARISON:  Chest radiograph 06/13/2014  FINDINGS: Single lead AICD device overlies the left hemi thorax. Stable cardiomegaly status post median sternotomy and CABG procedure. No consolidative pulmonary opacities. No pleural effusion or pneumothorax.  IMPRESSION: No acute cardiopulmonary process.   Electronically Signed   By: Lovey Newcomer M.D.   On: 10/02/2014 17:37   US Abdomen Limited Ruq  10/03/2014   CLINICAL DATA:  Transaminitis  EXAM: US ABDOMEN LIMITED - RIGHT UPPER QUADRANT  COMPARISON:  Abdominal CT scan of September 28, 2014 and abdominal ultrasound of April 18, 2014.  FINDINGS: Gallbladder:  Again demonstrated is an echogenic shadowing stone. The gallbladder wall is mildly thickened at 5 mm. There is no pericholecystic fluid or positive sonographic Murphy's sign.  Common bile duct:  Diameter: 3.1 mm  Liver:  No focal lesion identified. Within normal limits in parenchymal echogenicity.  IMPRESSION: There is a large gallstone and there is chronic mild gallbladder wall thickening. There is no sonographic evidence of acute cholecystitis. The liver and common bile duct are normal.   Electronically Signed   By: David  Martinique M.D.   On: 10/03/2014 08:38      Medications:     Current Medications: . apixaban  5 mg Oral BID  . benzonatate  100 mg Oral BID  . carvedilol  3.125 mg Oral BID WC  . diphenhydrAMINE  25 mg Oral Once  . fluticasone  2 spray Each Nare Daily  . insulin aspart  0-15 Units Subcutaneous TID WC  . insulin aspart  0-5 Units Subcutaneous QHS  . loratadine  10 mg Oral Daily  . pantoprazole  40 mg Oral Daily  . sodium chloride  3 mL Intravenous Q12H     Infusions: .  sodium chloride 50 mL/hr (10/03/14 1430)      Assessment:   1. Fatigue/lactic acidosis 2. Acute on chronic systolic HF    --EF 32% 3. Recent ischemic colitis 4. CAD s/p CABG 2004 5. Acute on chronic renal failure, stage 4 6. Transaminitis  Plan/Discussion:    I think all her findings point toward low output heart failure with multi-organ hypoperfusion (probable cold-dry physiology). I discussed this at length with her and her daughter. We discussed role of potential RHC to further evalaute and they agree. Will plan RHC first case in am. Discussed with Dr. Eppie Gibson. Will hold apixaban tonight and in am.   Length of Stay: 1  Bensimhon, Daniel,MD 6:56 PM   Advanced Heart Failure Team Pager [phone] (M-F; 7a - 4p)  Please contact [facility] for night-coverage after hours (4p -7a ) and weekends on [email or web address]

## 2014-10-04 NOTE — Interval H&P Note (Signed)
History and Physical Interval Note:  10/04/2014 8:44 AM  Jo Lynn  has presented today for surgery, with the diagnosis of chf  The various methods of treatment have been discussed with the patient and family. After consideration of risks, benefits and other options for treatment, the patient has consented to  Procedure(s): Right Heart Cath (N/A) as a surgical intervention .  The patient's history has been reviewed, patient examined, no change in status, stable for surgery.  I have reviewed the patient's chart and labs.  Questions were answered to the patient's satisfaction.     Lizmary Nader, Quillian Quince

## 2014-10-04 NOTE — Progress Notes (Signed)
PT Cancellation Note  Patient Details Name: Jo Lynn MRN: [id number] DOB: 1945/11/02   Cancelled Treatment:    Reason Eval/Treat Not Completed: Medical issues which prohibited therapy. Pt transferred from [place] to [place] and currently with Swan-Ganz catheter.  Will hold off on session due to Swan-Ganz catheter.  Will check on pt tomorrow.   [name] 10/04/2014, 10:43 AM

## 2014-10-04 NOTE — Progress Notes (Signed)
  Right heart cath results:  RA = 13 RV = 46/8/15 PA = 46/20 (31) PCW = 16 Fick cardiac output/index = 2.7/1.4 PVR = 5.5 WU SVR = 1540  FA sat = 98% PA sat = 36%, 37% Noninvasive BP = 82/57 (65)  Assessment: 1) Severely depressed cardiac output with normal left-sided filling pressures consistent with cardiogenic shock and "cold-dry" physiology. 2) Mild pulmonary HTN  Plan:  Will leave Swan in. Start milrinone and move to CCU. Will initiate process to consider advanced therapies (home inotropes vs VAD). Stop carvedilol.   Discussed with Dr. Eppie Gibson. We will place her on the AHF service while in the CCU. We will co-manage with the IMTS team.  Bensimhon, Daniel,MD 8:54 AM

## 2014-10-05 DIAGNOSIS — I5021 Acute systolic (congestive) heart failure: Secondary | ICD-10-CM

## 2014-10-05 DIAGNOSIS — R570 Cardiogenic shock: Secondary | ICD-10-CM

## 2014-10-05 LAB — POCT I-STAT 3, VENOUS BLOOD GAS (G3P V)
ACID-BASE DEFICIT: 5 mmol/L — AB (ref 0.0–2.0)
Acid-base deficit: 5 mmol/L — ABNORMAL HIGH (ref 0.0–2.0)
BICARBONATE: 19.6 meq/L — AB (ref 20.0–24.0)
Bicarbonate: 19.7 mEq/L — ABNORMAL LOW (ref 20.0–24.0)
O2 SAT: 37 %
O2 Saturation: 36 %
PCO2 VEN: 35.1 mmHg — AB (ref 45.0–50.0)
PH VEN: 7.357 — AB (ref 7.250–7.300)
PO2 VEN: 22 mmHg — AB (ref 30.0–45.0)
PO2 VEN: 23 mmHg — AB (ref 30.0–45.0)
TCO2: 21 mmol/L (ref 0–100)
TCO2: 21 mmol/L (ref 0–100)
pCO2, Ven: 35.6 mmHg — ABNORMAL LOW (ref 45.0–50.0)
pH, Ven: 7.349 — ABNORMAL HIGH (ref 7.250–7.300)

## 2014-10-05 LAB — APTT
aPTT: 67 seconds — ABNORMAL HIGH (ref 24–37)
aPTT: 55 seconds — ABNORMAL HIGH (ref 24–37)
aPTT: 49 seconds — ABNORMAL HIGH (ref 24–37)

## 2014-10-05 LAB — BASIC METABOLIC PANEL
Anion gap: 7 (ref 5–15)
BUN: 54 mg/dL — AB (ref 6–20)
CO2: 22 mmol/L (ref 22–32)
Calcium: 8.1 mg/dL — ABNORMAL LOW (ref 8.9–10.3)
Chloride: 111 mmol/L (ref 101–111)
Creatinine, Ser: 2.24 mg/dL — ABNORMAL HIGH (ref 0.44–1.00)
GFR calc Af Amer: 24 mL/min — ABNORMAL LOW (ref >60)
GFR calc non Af Amer: 21 mL/min — ABNORMAL LOW (ref >60)
GLUCOSE: 95 mg/dL (ref 65–99)
POTASSIUM: 3.5 mmol/L (ref 3.5–5.1)
Sodium: 140 mmol/L (ref 135–145)

## 2014-10-05 LAB — CBC
HCT: 28.9 % — ABNORMAL LOW (ref 36.0–46.0)
HEMOGLOBIN: 9.9 g/dL — AB (ref 12.0–15.0)
MCH: 31 pg (ref 26.0–34.0)
MCHC: 34.3 g/dL (ref 30.0–36.0)
MCV: 90.6 fL (ref 78.0–100.0)
Platelets: 167 10*3/uL (ref 150–400)
RBC: 3.19 MIL/uL — AB (ref 3.87–5.11)
RDW: 14.7 % (ref 11.5–15.5)
WBC: 7 10*3/uL (ref 4.0–10.5)

## 2014-10-05 LAB — CARBOXYHEMOGLOBIN
CARBOXYHEMOGLOBIN: 0.9 % (ref 0.5–1.5)
METHEMOGLOBIN: 0.8 % (ref 0.0–1.5)
O2 Saturation: 59.8 %
TOTAL HEMOGLOBIN: 10.8 g/dL — AB (ref 12.0–16.0)

## 2014-10-05 LAB — GLUCOSE, CAPILLARY
GLUCOSE-CAPILLARY: 133 mg/dL — AB (ref 65–99)
Glucose-Capillary: 104 mg/dL — ABNORMAL HIGH (ref 65–99)
Glucose-Capillary: 137 mg/dL — ABNORMAL HIGH (ref 65–99)

## 2014-10-05 MED ORDER — FUROSEMIDE 40 MG PO TABS
40.0000 mg | ORAL_TABLET | Freq: Two times a day (BID) | ORAL | Status: DC
  Administered 2014-10-05 – 2014-10-06 (×3): 40 mg via ORAL
  Filled 2014-10-05 (×4): qty 1

## 2014-10-05 MED ORDER — SPIRONOLACTONE 12.5 MG HALF TABLET
12.5000 mg | ORAL_TABLET | Freq: Every day | ORAL | Status: DC
  Administered 2014-10-05 – 2014-10-08 (×4): 12.5 mg via ORAL
  Filled 2014-10-05 (×5): qty 1

## 2014-10-05 NOTE — Telephone Encounter (Signed)
Leave message for pt to return call

## 2014-10-05 NOTE — Progress Notes (Signed)
ANTICOAGULATION CONSULT NOTE - Follow-up Consult  Pharmacy Consult for heparin Indication: atrial fibrillation  Allergies  Allergen Reactions  . Prednisone     "Did not feel good at all"    Patient Measurements: Height: 5\' 7"  (170.2 cm) Weight: 192 lb 14.4 oz (87.5 kg) IBW/kg (Calculated) : 61.6 Heparin Dosing Weight: 80kg  Vital Signs: Temp: 97.5 F (36.4 C) (08/10 0300) Temp Source: Core (Comment) (08/10 0400) BP: 95/59 mmHg (08/10 0300) Pulse Rate: 92 (08/10 0300)  Labs:  Recent Labs  10/02/14 1749 10/02/14 1750 10/03/14 0502 10/03/14 0803 10/04/14 1230 10/04/14 1400 10/05/14 0220  HGB  --  11.4* 11.8*  --   --   --   --   HCT  --  33.9* 35.1*  --   --   --   --   PLT  --  196 211  --   --   --   --   APTT  --   --   --   --  35  --  67*  HEPARINUNFRC  --   --   --   --   --  >2.20*  --   CREATININE  --  2.58*  --  2.69*  --   --   --   TROPONINI <0.03  --   --   --   --   --   --     Estimated Creatinine Clearance: 22.1 mL/min (by C-G formula based on Cr of 2.69).  Assessment: 69 year old female with PAF pta on eliquis last dose 8/8am. Patient now s/p RHC with low output to be bridged on IV heparin, swan placed, milrinone to start.  Sheath not removed as swan was placed, Heparin restarted 8h post cath. No bleeding noted.   Given recent apixaban use will rely on aptt for anticoagulation monitoring. APTT 67 sec (therapeutic). Baseline heparin level >2.2.  Goal of Therapy:  Heparin level 0.3-0.7 units/ml aPTT 66-102 seconds Monitor platelets by anticoagulation protocol: Yes   Plan:  Continue heparin 900 units/hr Will check 6 hr aPTT to confirm therapeutic Daily HL/aptt  Sherlon Handing, PharmD, BCPS Clinical pharmacist, pager [phone] 10/05/2014 5:35 AM

## 2014-10-05 NOTE — Progress Notes (Signed)
ANTICOAGULATION CONSULT NOTE - Follow Up Consult  Pharmacy Consult for Heparin Indication: atrial fibrillation  Allergies  Allergen Reactions  . Prednisone     "Did not feel good at all"    Patient Measurements: Height: 5\' 7"  (170.2 cm) Weight: 192 lb 14.4 oz (87.5 kg) IBW/kg (Calculated) : 61.6 Heparin Dosing Weight: 80 kg  Vital Signs: Temp: 99.3 F (37.4 C) (08/10 2300) Temp Source: Core (Comment) (08/10 2000) BP: 114/68 mmHg (08/10 2300) Pulse Rate: 105 (08/10 2300)  Labs:  Recent Labs  10/03/14 0502 10/03/14 0803  10/04/14 1400 10/05/14 0220 10/05/14 0541 10/05/14 0636 10/05/14 1100 10/05/14 2200  HGB 11.8*  --   --   --   --  9.9*  --   --   --   HCT 35.1*  --   --   --   --  28.9*  --   --   --   PLT 211  --   --   --   --  167  --   --   --   APTT  --   --   < >  --  67*  --   --  55* 49*  HEPARINUNFRC  --   --   --  >2.20*  --   --   --   --   --   CREATININE  --  2.69*  --   --   --   --  2.24*  --   --   < > = values in this interval not displayed.  Estimated Creatinine Clearance: 26.6 mL/min (by C-G formula based on Cr of 2.24).  Assessment:   APTT is down to 49 seconds tonight after increasing heparin drip  from 900 to 1050 units/hr earlier today. No known infusion problems.   Goal of Therapy:  Heparin level 0.3-0.7 units/ml aPTT 66-102 seconds Monitor platelets by anticoagulation protocol: Yes   Plan:   Increase heparin drip to 1200 units/hr.  Next aPTT, heparin level and CBC in am.  Arty Baumgartner, Sylvester Pager: [phone] 10/05/2014,11:16 PM

## 2014-10-05 NOTE — Progress Notes (Signed)
PT Cancellation Note  Patient Details Name: Jo Lynn MRN: [id number] DOB: 09-Feb-1946   Cancelled Treatment:    Reason Eval/Treat Not Completed: Medical issues which prohibited therapy Patient still has Swan-Ganz catheter in. RN had recently sat patient in chair, asked [place] to return tomorrow for ambulation when S-G removed. Will see tomorrow after S-G removed to ambulate. ThanksRoanna Epley, SPT [phone]  10/05/2014, 1:23 PM  I have read, reviewed and agree with student's note.   [place] [phone] (pager)

## 2014-10-05 NOTE — Progress Notes (Signed)
ANTICOAGULATION CONSULT NOTE - Follow-up Consult  Pharmacy Consult for heparin Indication: atrial fibrillation  Allergies  Allergen Reactions  . Prednisone     "Did not feel good at all"    Patient Measurements: Height: 5\' 7"  (170.2 cm) Weight: 192 lb 14.4 oz (87.5 kg) IBW/kg (Calculated) : 61.6 Heparin Dosing Weight: 80kg  Vital Signs: Temp: 98.2 F (36.8 C) (08/10 1245) Temp Source: Core (Comment) (08/10 1245) BP: 107/42 mmHg (08/10 1245) Pulse Rate: 102 (08/10 1245)  Labs:  Recent Labs  10/02/14 1749  10/02/14 1750 10/03/14 0502 10/03/14 0803 10/04/14 1230 10/04/14 1400 10/05/14 0220 10/05/14 0541 10/05/14 0636 10/05/14 1100  HGB  --   < > 11.4* 11.8*  --   --   --   --  9.9*  --   --   HCT  --   --  33.9* 35.1*  --   --   --   --  28.9*  --   --   PLT  --   --  196 211  --   --   --   --  167  --   --   APTT  --   --   --   --   --  35  --  67*  --   --  55*  HEPARINUNFRC  --   --   --   --   --   --  >2.20*  --   --   --   --   CREATININE  --   --  2.58*  --  2.69*  --   --   --   --  2.24*  --   TROPONINI <0.03  --   --   --   --   --   --   --   --   --   --   < > = values in this interval not displayed.  Estimated Creatinine Clearance: 26.6 mL/min (by C-G formula based on Cr of 2.24).  Assessment: 69 year old female with PAF pta on eliquis last dose 8/8am. Patient now s/p RHC with low output to be bridged on IV heparin, swan placed on milrinone. Plans for home milrinone vs VAD.  -aPTT is 55 and below goal  Goal of Therapy:  Heparin level 0.3-0.7 units/ml aPTT 66-102 seconds Monitor platelets by anticoagulation protocol: Yes   Plan:  -Increase heparin to 1050 units/hr -aPTT in 8 hours -Daily CBC, heparin level and aPTT  Hildred Laser, Pharm D 10/05/2014 1:00 PM

## 2014-10-05 NOTE — Progress Notes (Signed)
Advanced Heart Failure Rounding Note  Primary Cardiologist: Hochrein  Subjective:    Feeling better.  Says she has more energy.  Legs not hurting as much today.  Denies Orthopnea or CP.  No output recorded for yesterday. 300-400 cc in bed pan last night per Nurse, along with soaking the bed pad. Up 1 lb. Coox 59.8%. CVP 4.   Cr improved  2.58 -> 2.69 -> 2.24. K 4.2   Swan CVP 10 PA 50/22 (33) Wedge 23 Thermo 4.2/2.1 SVR 1200 PVR 1.9 WU RA/PCWP = 0.43  Objective:   Weight Range: 192 lb 14.4 oz (87.5 kg) Body mass index is 30.21 kg/(m^2).   Vital Signs:   Temp:  [97.2 F (36.2 C)-99 F (37.2 C)] 97.7 F (36.5 C) (08/10 0700) Pulse Rate:  [0-257] 90 (08/10 0700) Resp:  [0-30] 14 (08/10 0700) BP: (73-157)/(44-105) 103/57 mmHg (08/10 0700) SpO2:  [0 %-100 %] 100 % (08/10 0700) Weight:  [191 lb 12.8 oz (87 kg)-192 lb 14.4 oz (87.5 kg)] 192 lb 14.4 oz (87.5 kg) (08/10 0500) Last BM Date: 10/03/14  Weight change: Filed Weights   10/03/14 0515 10/04/14 1300 10/05/14 0500  Weight: 190 lb 12.8 oz (86.546 kg) 191 lb 12.8 oz (87 kg) 192 lb 14.4 oz (87.5 kg)    Intake/Output:   Intake/Output Summary (Last 24 hours) at 10/05/14 1610 Last data filed at 10/05/14 0600  Gross per 24 hour  Intake 597.18 ml  Output      0 ml  Net 597.18 ml     Physical Exam: General: Elderly woman. Lying flat in bed without difficulty. Fatigued appearing. No resp difficulty HEENT: normal Neck: supple. JVP 6-7 . Carotids 2+ bilat; no bruits. No lymphadenopathy or thryomegaly appreciated. R Swan Cor: PMI laterally displaced. Regular Tachy. +s3 Lungs: CTA Abdomen: soft, nontender, nondistended. No hepatosplenomegaly. No bruits or masses. Good bowel sounds. Extremities: no cyanosis, clubbing, rash, edema Neuro: alert & orientedx3, cranial nerves grossly intact. moves all 4 extremities w/o difficulty. Affect pleasant   Telemetry: NSR 90s with occasional PVCs  Labs: CBC  Recent Labs   10/02/14 1750 10/03/14 0502 10/05/14 0541  WBC 8.9 10.5 7.0  NEUTROABS 6.8  --   --   HGB 11.4* 11.8* 9.9*  HCT 33.9* 35.1* 28.9*  MCV 91.1 91.9 90.6  PLT 196 211 960   Basic Metabolic Panel  Recent Labs  10/02/14 1750 10/03/14 0803  NA 135 137  K 4.5 4.2  CL 103 106  CO2 19* 19*  GLUCOSE 320* 184*  BUN 60* 61*  CALCIUM 9.0 8.7*   Liver Function Tests  Recent Labs  10/02/14 1750 10/03/14 0803  AST 259* 282*  ALT 378* 379*  ALKPHOS 192* 184*  BILITOT 1.5* 1.6*  PROT 6.4* 6.2*  ALBUMIN 3.5 3.2*    Recent Labs  10/02/14 1750  LIPASE 50   Cardiac Enzymes  Recent Labs  10/02/14 1749  TROPONINI <0.03    BNP: BNP (last 3 results)  Recent Labs  04/18/14 1302 06/13/14 0349 10/02/14 1750  BNP >4500.0* 409.6* 2880.4*    ProBNP (last 3 results) No results for input(s): PROBNP in the last 8760 hours.   D-Dimer No results for input(s): DDIMER in the last 72 hours. Hemoglobin A1C  Recent Labs  10/03/14 1501  HGBA1C 8.7*   Fasting Lipid Panel No results for input(s): CHOL, HDL, LDLCALC, TRIG, CHOLHDL, LDLDIRECT in the last 72 hours. Thyroid Function Tests No results for input(s): TSH, T4TOTAL, T3FREE, THYROIDAB in the last  72 hours.  Invalid input(s): FREET3  Other results:     Imaging/Studies:  Dg Chest Port 1 View  10/04/2014   CLINICAL DATA:  Central line placement  EXAM: PORTABLE CHEST - 1 VIEW  COMPARISON:  10/02/2014  FINDINGS: Right jugular Swan-Ganz catheter has been placed with tip in the right lower lobe pulmonary artery. No pneumothorax  Cardiac enlargement without heart failure. Negative for infiltrate or effusion. No edema. AICD lead in the right ventricle unchanged.  IMPRESSION: Swan-Ganz catheter tip in the right lower lobe pulmonary artery. No pneumothorax  Negative for heart failure.   Electronically Signed   By: Franchot Gallo M.D.   On: 10/04/2014 10:21   US Abdomen Limited Ruq  10/03/2014   CLINICAL DATA:  Transaminitis   EXAM: US ABDOMEN LIMITED - RIGHT UPPER QUADRANT  COMPARISON:  Abdominal CT scan of September 28, 2014 and abdominal ultrasound of April 18, 2014.  FINDINGS: Gallbladder:  Again demonstrated is an echogenic shadowing stone. The gallbladder wall is mildly thickened at 5 mm. There is no pericholecystic fluid or positive sonographic Murphy's sign.  Common bile duct:  Diameter: 3.1 mm  Liver:  No focal lesion identified. Within normal limits in parenchymal echogenicity.  IMPRESSION: There is a large gallstone and there is chronic mild gallbladder wall thickening. There is no sonographic evidence of acute cholecystitis. The liver and common bile duct are normal.   Electronically Signed   By: David  Martinique M.D.   On: 10/03/2014 08:38     Latest Echo LV EF: 20% -  25%, Grade 3 DD  Latest Cath  RHC 10/04/14 RA = 13 RV = 46/8/15 PA = 46/20 (31) PCW = 16 Fick cardiac output/index = 2.7/1.4 PVR = 5.5 WU SVR = 1540  FA sat = 98% PA sat = 36%, 37% Noninvasive BP = 82/57 (65)    Medications:     Scheduled Medications: . benzonatate  100 mg Oral BID  . fluticasone  2 spray Each Nare Daily  . insulin aspart  0-15 Units Subcutaneous TID WC  . insulin aspart  0-5 Units Subcutaneous QHS  . loratadine  10 mg Oral Daily  . pantoprazole  40 mg Oral Daily  . sodium chloride  3 mL Intravenous Q12H  . sodium chloride  3 mL Intravenous Q12H  . sodium chloride  3 mL Intravenous Q12H     Infusions: . sodium chloride 4 mL/hr at 10/04/14 1000  . heparin 900 Units/hr (10/04/14 1730)  . milrinone 0.25 mcg/kg/min (10/04/14 2304)     PRN Medications:  sodium chloride, sodium chloride, acetaminophen, ondansetron **OR** ondansetron (ZOFRAN) IV, sodium chloride, sodium chloride   Assessment/Plan    1. Acute on chronic systolic HF -> cardiogenic shock 2. Acute on chronic combined HF  --EF 20-25%, Grade 3 DD Echo 2/16 3. Recent ischemic colitis 4. CAD s/p CABG 2004 5. Acute on chronic renal  failure, stage 4     On milrinone 0.25.  Have initiated process for consideration of advanced therapies. (Home milrinone vs. VAD). Off Coreg with cardiogenic shock. Coox 59.8%. CVP 4   Length of Stay: 3 Shirley Friar PA-C 10/05/2014, 7:22 AM  Advanced Heart Failure Team Pager [phone] (M-F; 7a - 4p)  Please contact [facility] for night-coverage after hours (4p -7a ) and weekends on [email or web address]  Patient seen and examined with Oda Kilts, PA-C. We discussed all aspects of the encounter. I agree with the assessment and plan as stated above.   Luiz Blare numbers done personally.  Hemodynamics improved on milrinone. Will start back po lasix. Will have PICC placed for home inotropes. Keep swan in one more day. Watch hgb. Will have VAD team begin evaluation process but will need to see how good renal function gets.   The patient is critically ill with multiple organ systems failure and requires high complexity decision making for assessment and support, frequent evaluation and titration of therapies, application of advanced monitoring technologies and extensive interpretation of multiple databases.   Critical Care Time devoted to patient care services described in this note is 35 Minutes.  Bensimhon, Daniel,MD 8:02 AM

## 2014-10-05 NOTE — Care Management Note (Addendum)
Case Management Note  Patient Details  Name: Jo Lynn MRN: [id number] Date of Birth: [birth year] Subjective/Objective:  69 y.o. F   Admitted 10/02/14 for CHF Exacerbation and failure outpt treatment of Colitis with Flagyl and Cipro. She had been discharged on 09/29/2014 feeling well and had experienced decreased mobility, fatigue and decreased appetite since that time. Being Treated for multisystem organ failure . Daughters are support system.                  Action/Plan: MD recommending Home Milrinone. Have made Pam with [facility] aware. Will  Need other HH Needs re: HHRN etc if desired  Expected Discharge Date:                  Expected Discharge Plan:     In-House Referral:     Discharge planning Services  CM Consult (Home Milnrinone)  Post Acute Care Choice:    Choice offered to:     DME Arranged:    DME Agency:     HH Arranged:   (Home Infusion) [place]:  [facility]  Status of Service:  In process, will continue to follow  Medicare Important Message Given:    Date Medicare IM Given:    Medicare IM give by:    Date Additional Medicare IM Given:    Additional Medicare Important Message give by:     If discussed at [place] of Stay Meetings, dates discussed:    Additional Comments:  Delrae Sawyers, RN 10/05/2014, 10:44 AM

## 2014-10-05 NOTE — Progress Notes (Signed)
Spoke briefly about plan of care with pt and daughter at pts bedside. Pt stated that she was not sure if she wanted to have PICC line inserted or go home on medications. MD spoke with pt this plan of care at bedside this morning. Pt did not state that she did not want PICC line at that time. She was also very adamant about not have a VAD placed because she still wants to work. (I told her that there are options of still working but she did not want to "be hooked up to anything") Paged MD to see if he could discuss with pt the benefits of continuing therapies or pt being comfortable at home.

## 2014-10-06 DIAGNOSIS — N184 Chronic kidney disease, stage 4 (severe): Secondary | ICD-10-CM

## 2014-10-06 DIAGNOSIS — N179 Acute kidney failure, unspecified: Secondary | ICD-10-CM

## 2014-10-06 LAB — HEPARIN LEVEL (UNFRACTIONATED): Heparin Unfractionated: 1.54 IU/mL — ABNORMAL HIGH (ref 0.30–0.70)

## 2014-10-06 LAB — GLUCOSE, CAPILLARY
GLUCOSE-CAPILLARY: 187 mg/dL — AB (ref 65–99)
GLUCOSE-CAPILLARY: 217 mg/dL — AB (ref 65–99)
Glucose-Capillary: 208 mg/dL — ABNORMAL HIGH (ref 65–99)
Glucose-Capillary: 66 mg/dL (ref 65–99)
Glucose-Capillary: 232 mg/dL — ABNORMAL HIGH (ref 65–99)

## 2014-10-06 LAB — CBC
HCT: 30.5 % — ABNORMAL LOW (ref 36.0–46.0)
Hemoglobin: 10.3 g/dL — ABNORMAL LOW (ref 12.0–15.0)
MCH: 30.7 pg (ref 26.0–34.0)
MCHC: 33.8 g/dL (ref 30.0–36.0)
MCV: 90.8 fL (ref 78.0–100.0)
Platelets: 175 10*3/uL (ref 150–400)
RBC: 3.36 MIL/uL — ABNORMAL LOW (ref 3.87–5.11)
RDW: 14.8 % (ref 11.5–15.5)
WBC: 7.6 10*3/uL (ref 4.0–10.5)

## 2014-10-06 LAB — BASIC METABOLIC PANEL
Anion gap: 8 (ref 5–15)
BUN: 42 mg/dL — AB (ref 6–20)
CHLORIDE: 109 mmol/L (ref 101–111)
CO2: 24 mmol/L (ref 22–32)
Calcium: 8.2 mg/dL — ABNORMAL LOW (ref 8.9–10.3)
Creatinine, Ser: 1.87 mg/dL — ABNORMAL HIGH (ref 0.44–1.00)
GFR, EST AFRICAN AMERICAN: 30 mL/min — AB (ref >60)
GFR, EST NON AFRICAN AMERICAN: 26 mL/min — AB (ref >60)
Glucose, Bld: 89 mg/dL (ref 65–99)
Potassium: 3.4 mmol/L — ABNORMAL LOW (ref 3.5–5.1)
SODIUM: 141 mmol/L (ref 135–145)

## 2014-10-06 LAB — APTT: APTT: 60 s — AB (ref 24–37)

## 2014-10-06 LAB — CARBOXYHEMOGLOBIN
Carboxyhemoglobin: 1 % (ref 0.5–1.5)
Methemoglobin: 1 % (ref 0.0–1.5)
O2 Saturation: 62.7 %
TOTAL HEMOGLOBIN: 10.9 g/dL — AB (ref 12.0–16.0)

## 2014-10-06 MED ORDER — SODIUM CHLORIDE 0.9 % IJ SOLN: 10.0000 mL | INTRAMUSCULAR | Status: DC | PRN

## 2014-10-06 MED ORDER — FUROSEMIDE 10 MG/ML IJ SOLN
80.0000 mg | Freq: Once | INTRAMUSCULAR | Status: AC
  Administered 2014-10-06: 80 mg via INTRAVENOUS
  Filled 2014-10-06: qty 8

## 2014-10-06 MED ORDER — SODIUM CHLORIDE 0.9 % IJ SOLN
10.0000 mL | Freq: Two times a day (BID) | INTRAMUSCULAR | Status: DC
Start: 2014-10-06 — End: 2014-10-08
  Administered 2014-10-06 – 2014-10-08 (×6): 10 mL

## 2014-10-06 MED ORDER — APIXABAN 5 MG PO TABS
5.0000 mg | ORAL_TABLET | Freq: Two times a day (BID) | ORAL | Status: DC
  Administered 2014-10-06 – 2014-10-08 (×5): 5 mg via ORAL
  Filled 2014-10-06 (×6): qty 1

## 2014-10-06 MED ORDER — POTASSIUM CHLORIDE CRYS ER 20 MEQ PO TBCR
40.0000 meq | EXTENDED_RELEASE_TABLET | Freq: Two times a day (BID) | ORAL | Status: DC
  Administered 2014-10-06: 40 meq via ORAL
  Filled 2014-10-06: qty 2

## 2014-10-06 MED ORDER — FUROSEMIDE 80 MG PO TABS
80.0000 mg | ORAL_TABLET | Freq: Two times a day (BID) | ORAL | Status: DC
  Administered 2014-10-06 – 2014-10-08 (×4): 80 mg via ORAL
  Filled 2014-10-06 (×7): qty 1

## 2014-10-06 MED ORDER — POTASSIUM CHLORIDE CRYS ER 20 MEQ PO TBCR
40.0000 meq | EXTENDED_RELEASE_TABLET | Freq: Two times a day (BID) | ORAL | Status: DC
  Administered 2014-10-06 – 2014-10-07 (×2): 40 meq via ORAL
  Filled 2014-10-06 (×4): qty 2

## 2014-10-06 MED ORDER — POTASSIUM CHLORIDE CRYS ER 20 MEQ PO TBCR
40.0000 meq | EXTENDED_RELEASE_TABLET | Freq: Once | ORAL | Status: AC
  Administered 2014-10-06: 40 meq via ORAL
  Filled 2014-10-06: qty 2

## 2014-10-06 NOTE — Progress Notes (Signed)
Peripherally Inserted Central Catheter/Midline Placement  The IV Nurse has discussed with the patient and/or persons authorized to consent for the patient, the purpose of this procedure and the potential benefits and risks involved with this procedure.  The benefits include less needle sticks, lab draws from the catheter and patient may be discharged home with the catheter.  Risks include, but not limited to, infection, bleeding, blood clot (thrombus formation), and puncture of an artery; nerve damage and irregular heat beat.  Alternatives to this procedure were also discussed.  PICC/Midline Placement Documentation  PICC / Midline Double Lumen 94/58/59 PICC Right Basilic 39 cm 0 cm (Active)  Indication for Insertion or Continuance of Line Vasoactive infusions;Home intravenous therapies (PICC only) 10/06/2014 11:00 AM  Exposed Catheter (cm) 0 cm 10/06/2014 11:00 AM  Site Assessment Clean;Dry;Intact 10/06/2014 11:00 AM  Lumen #1 Status Flushed;Saline locked;Blood return noted 10/06/2014 11:00 AM  Lumen #2 Status Flushed;Saline locked;Blood return noted 10/06/2014 11:00 AM  Dressing Type Transparent 10/06/2014 11:00 AM  Dressing Status Clean;Dry;Intact;Antimicrobial disc in place 10/06/2014 11:00 AM  Line Care Connections checked and tightened 10/06/2014 11:00 AM  Line Adjustment (NICU/IV Team Only) No 10/06/2014 11:00 AM  Dressing Intervention New dressing 10/06/2014 11:00 AM  Dressing Change Due 10/13/14 10/06/2014 11:00 AM       Rolena Infante 10/06/2014, 11:31 AM

## 2014-10-06 NOTE — Progress Notes (Signed)
Advanced Heart Failure Rounding Note  Primary Cardiologist: Hochrein  Subjective:    Feeling better.  Says she has more energy.  Legs not hurting as much today.  Denies Orthopnea or CP. Refused PICC yesterday  Out 1.2 L yesterday, weight stable. Coox 62.7%. CVP 4.   Cr improved  2.58 -> 2.69 -> 2.24 -> 1.87. K 3.4   Swan CVP 8 PA 55/25 (38) Wedge 28 Thermo  4.0/2.0   Objective:   Weight Range: 192 lb 7.4 oz (87.3 kg) Body mass index is 30.14 kg/(m^2).   Vital Signs:   Temp:  [97.5 F (36.4 C)-99.3 F (37.4 C)] 97.8 F (36.6 C) (08/11 0815) Pulse Rate:  [95-116] 101 (08/11 0815) Resp:  [9-22] 9 (08/11 0815) BP: (87-116)/(42-78) 97/51 mmHg (08/11 0815) SpO2:  [96 %-100 %] 100 % (08/11 0815) Weight:  [192 lb 7.4 oz (87.3 kg)] 192 lb 7.4 oz (87.3 kg) (08/11 0345) Last BM Date: 10/05/14  Weight change: Filed Weights   10/04/14 1300 10/05/14 0500 10/06/14 0345  Weight: 191 lb 12.8 oz (87 kg) 192 lb 14.4 oz (87.5 kg) 192 lb 7.4 oz (87.3 kg)    Intake/Output:   Intake/Output Summary (Last 24 hours) at 10/06/14 2878 Last data filed at 10/06/14 0800  Gross per 24 hour  Intake 657.21 ml  Output   1900 ml  Net -1242.79 ml     Physical Exam: General: Elderly woman. Lying flat in bed without difficulty. Fatigued appearing. No resp difficulty HEENT: normal Neck: supple. JVP 8 Carotids 2+ bilat; no bruits. No lymphadenopathy or thryomegaly appreciated. RIJ Swan Cor: PMI laterally displaced. Regular Tachy. +s3 Lungs: CTA Abdomen: soft, nontender, nondistended. No hepatosplenomegaly. No bruits or masses. Good bowel sounds. Extremities: no cyanosis, clubbing, rash, tr edema Neuro: alert & orientedx3, cranial nerves grossly intact. moves all 4 extremities w/o difficulty. Affect pleasant   Telemetry: NSR 90s with occasional PVCs  Labs: CBC  Recent Labs  10/05/14 0541 10/06/14 0410  WBC 7.0 7.6  HGB 9.9* 10.3*  HCT 28.9* 30.5*  MCV 90.6 90.8  PLT 167 676    Basic Metabolic Panel  Recent Labs  10/05/14 0636 10/06/14 0410  NA 140 141  K 3.5 3.4*  CL 111 109  CO2 22 24  GLUCOSE 95 89  BUN 54* 42*  CALCIUM 8.1* 8.2*   Liver Function Tests No results for input(s): AST, ALT, ALKPHOS, BILITOT, PROT, ALBUMIN in the last 72 hours. No results for input(s): LIPASE, AMYLASE in the last 72 hours. Cardiac Enzymes No results for input(s): CKTOTAL, CKMB, CKMBINDEX, TROPONINI in the last 72 hours.  BNP: BNP (last 3 results)  Recent Labs  04/18/14 1302 06/13/14 0349 10/02/14 1750  BNP >4500.0* 409.6* 2880.4*    ProBNP (last 3 results) No results for input(s): PROBNP in the last 8760 hours.   D-Dimer No results for input(s): DDIMER in the last 72 hours. Hemoglobin A1C  Recent Labs  10/03/14 1501  HGBA1C 8.7*   Fasting Lipid Panel No results for input(s): CHOL, HDL, LDLCALC, TRIG, CHOLHDL, LDLDIRECT in the last 72 hours. Thyroid Function Tests No results for input(s): TSH, T4TOTAL, T3FREE, THYROIDAB in the last 72 hours.  Invalid input(s): FREET3  Other results:     Imaging/Studies:  Dg Chest Port 1 View  10/04/2014   CLINICAL DATA:  Central line placement  EXAM: PORTABLE CHEST - 1 VIEW  COMPARISON:  10/02/2014  FINDINGS: Right jugular Swan-Ganz catheter has been placed with tip in the right lower lobe pulmonary artery.  No pneumothorax  Cardiac enlargement without heart failure. Negative for infiltrate or effusion. No edema. AICD lead in the right ventricle unchanged.  IMPRESSION: Swan-Ganz catheter tip in the right lower lobe pulmonary artery. No pneumothorax  Negative for heart failure.   Electronically Signed   By: Franchot Gallo M.D.   On: 10/04/2014 10:21    Latest Echo LV EF: 20% -  25%, Grade 3 DD  Latest Cath  RHC 10/04/14 RA = 13 RV = 46/8/15 PA = 46/20 (31) PCW = 16 Fick cardiac output/index = 2.7/1.4 PVR = 5.5 WU SVR = 1540  FA sat = 98% PA sat = 36%, 37% Noninvasive BP = 82/57  (65)    Medications:     Scheduled Medications: . benzonatate  100 mg Oral BID  . fluticasone  2 spray Each Nare Daily  . furosemide  40 mg Oral BID  . insulin aspart  0-15 Units Subcutaneous TID WC  . insulin aspart  0-5 Units Subcutaneous QHS  . loratadine  10 mg Oral Daily  . pantoprazole  40 mg Oral Daily  . potassium chloride  40 mEq Oral BID  . spironolactone  12.5 mg Oral Daily    Infusions: . sodium chloride Stopped (10/05/14 1302)  . heparin 1,350 Units/hr (10/06/14 0557)  . milrinone 0.25 mcg/kg/min (10/05/14 2000)    PRN Medications: acetaminophen, ondansetron **OR** ondansetron (ZOFRAN) IV   Assessment/Plan    1. Acute on chronic systolic HF -> cardiogenic shock 2. Acute on chronic combined HF  --EF 20-25%, Grade 3 DD Echo 2/16 3. Recent ischemic colitis 4. CAD s/p CABG 2004 5. Acute on chronic renal failure, stage 4   Hgb slightly up from yesterday.   Swan to be removed today.  PICC will be placed for home inotropes.  On milrinone 0.25.  Have initiated process for consideration of advanced therapies. (Home milrinone vs. VAD). Hold BB with cardiogenic shock. Coox 62.7%.    Length of Stay: 4 Shirley Friar PA-C 10/06/2014, 9:37 AM  Advanced Heart Failure Team Pager [phone] (M-F; 7a - 4p)  Please contact [facility] for night-coverage after hours (4p -7a ) and weekends on [email or web address]  Patient seen and examined with Oda Kilts, PA-C. We discussed all aspects of the encounter. I agree with the assessment and plan as stated above.   Shock much improved with milrinone. She remains unsure about starting inotropic support. She wants to go back to work. Long talk with her about the pros and cons of inotropic support and the fact that her heart is likely no longer strong enough to support her without advanced therapies. She has decided to go ahead and proceed with PICC line with eye toward home inotropes. Volume back up. Will increase lasix. Stop  heparin; resume apixaban. Pull Swan. I will speak with her daughter as well. Can got SDU once Luiz Blare is out.   The patient is critically ill with multiple organ systems failure and requires high complexity decision making for assessment and support, frequent evaluation and titration of therapies, application of advanced monitoring technologies and extensive interpretation of multiple databases.   Critical Care Time devoted to patient care services described in this note is 35 Minutes.  Loletha Bertini,MD 10:14 AM

## 2014-10-06 NOTE — Progress Notes (Signed)
Physical Therapy Treatment Patient Details Name: Jo Lynn MRN: [id number] DOB: 09-23-1945 Today's Date: 10/06/2014    History of Present Illness [name] is a 69 y.o. AA female with past medical history of hypertension, hyperlipidemia, diabetes, CKD stage IV, STEMI s/p PCI and CABG, atrial fibrillation, HFrEF (EF 20-25% in February 2015 who was recently discharged home from this hospital on September 29, 2014 in which she was treated for colitis,  Pt began having increase in swelling in B LE as well as weakness and admitted with CHF exacerbation.    PT Comments    Patient seated in recliner and agreeable to participate in PT today, although eager to eat lunch. Patient was able to ambulate and transfer as described below. Due to lunch, patient did not want or feel the need to practice stairs, although she reported that she would if  she is scheduled to be seen again. At this time, patient will benefit from PT to address stairs and increase advanced balance during gait.   Follow Up Recommendations  No PT follow up     Equipment Recommendations  None recommended by PT    Recommendations for Other Services       Precautions / Restrictions Precautions Precautions: None Restrictions Weight Bearing Restrictions: No    Mobility  Bed Mobility               General bed mobility comments: Patient found in recliner.  Transfers Overall transfer level: Modified independent Equipment used: None Transfers: Sit to/from Stand Sit to Stand: Supervision         General transfer comment: Patient able to stand unassisted, needed management of lines and leads.  Ambulation/Gait Ambulation/Gait assistance: Supervision Ambulation Distance (Feet): 200 Feet Assistive device: None Gait Pattern/deviations: Decreased stride length   Gait velocity interpretation: at or above normal speed for age/gender General Gait Details: Had one episode of unsteadiness within first 20 feet  of walking, patient able to correct without assistance. Patient also able to withstand gait challenges such as horizontal head shakes, looking at the ceiling and looking at the floor, with no LOB although slightly decreased speed was noted.   Stairs Stairs:  (Will assess next time if pt still here.)          Wheelchair Mobility    Modified Rankin (Stroke Patients Only)       Balance Overall balance assessment: Modified Independent   Sitting balance-Leahy Scale: Normal       Standing balance-Leahy Scale: Good                      Cognition Arousal/Alertness: Awake/alert Behavior During Therapy: WFL for tasks assessed/performed Overall Cognitive Status: Within Functional Limits for tasks assessed                      Exercises      General Comments        Pertinent Vitals/Pain Pain Assessment: No/denies pain    Home Living                      Prior Function            PT Goals (current goals can now be found in the care plan section) Acute Rehab PT Goals Patient Stated Goal: Go home and return to work PT Goal Formulation: With patient Time For Goal Achievement: 10/17/14 Potential to Achieve Goals: Good Progress towards PT goals: Progressing toward goals  Frequency  Min 3X/week    PT Plan Current plan remains appropriate    Co-evaluation             End of Session Equipment Utilized During Treatment: Gait belt Activity Tolerance: Patient tolerated treatment well Patient left: in chair;with call bell/phone within reach     Time: 1322-1339 PT Time Calculation (min) (ACUTE ONLY): 17 min  Charges:  $Gait Training: 8-22 mins                    G CodesRoanna Epley, SPT [phone]  10/06/2014, 2:34 PM  I have read, reviewed and agree with student's note.   [place] [phone] (pager)

## 2014-10-06 NOTE — Progress Notes (Signed)
ANTICOAGULATION CONSULT NOTE - Follow Up Consult  Pharmacy Consult for Heparin Indication: atrial fibrillation  Allergies  Allergen Reactions  . Prednisone     "Did not feel good at all"    Patient Measurements: Height: 5\' 7"  (170.2 cm) Weight: 192 lb 7.4 oz (87.3 kg) IBW/kg (Calculated) : 61.6 Heparin Dosing Weight: 80 kg  Vital Signs: Temp: 98.1 F (36.7 C) (08/11 0400) Temp Source: Core (Comment) (08/11 0400) BP: 93/58 mmHg (08/11 0500) Pulse Rate: 96 (08/11 0500)  Labs:  Recent Labs  10/03/14 0803  10/04/14 1400  10/05/14 0541 10/05/14 0636 10/05/14 1100 10/05/14 2200 10/06/14 0410  HGB  --   --   --   --  9.9*  --   --   --  10.3*  HCT  --   --   --   --  28.9*  --   --   --  30.5*  PLT  --   --   --   --  167  --   --   --  175  APTT  --   < >  --   < >  --   --  55* 49* 60*  HEPARINUNFRC  --   --  >2.20*  --   --   --   --   --  1.54*  CREATININE 2.69*  --   --   --   --  2.24*  --   --  1.87*  < > = values in this interval not displayed.  Estimated Creatinine Clearance: 31.8 mL/min (by C-G formula based on Cr of 1.87).  Assessment: 69 year old female with PAF pta on eliquis last dose 8/8 am. Patient s/p RHC with low output to be bridged on IV heparin. aPTT remains low at 60 sec on 1200 units/hr. Heparin level elevated (still being affected by Eliquis). No issues with line or bleeding per RN. CBC stable.   Goal of Therapy:  Heparin level 0.3-0.7 units/ml aPTT 66-102 seconds Monitor platelets by anticoagulation protocol: Yes   Plan:  Increase heparin drip to 1350 units/hr. F/u 8 hr aPTT Daily heparin level, aPTT, and CBC  Sherlon Handing, PharmD, BCPS Clinical pharmacist, pager [phone] 10/06/2014,5:45 AM

## 2014-10-07 ENCOUNTER — Ambulatory Visit: Payer: Medicare Other | Admitting: Physician Assistant

## 2014-10-07 ENCOUNTER — Telehealth: Payer: Self-pay | Admitting: Cardiology

## 2014-10-07 LAB — BASIC METABOLIC PANEL
Anion gap: 8 (ref 5–15)
BUN: 28 mg/dL — AB (ref 6–20)
CHLORIDE: 104 mmol/L (ref 101–111)
CO2: 27 mmol/L (ref 22–32)
Calcium: 8.8 mg/dL — ABNORMAL LOW (ref 8.9–10.3)
Creatinine, Ser: 1.61 mg/dL — ABNORMAL HIGH (ref 0.44–1.00)
GFR, EST AFRICAN AMERICAN: 36 mL/min — AB (ref >60)
GFR, EST NON AFRICAN AMERICAN: 31 mL/min — AB (ref >60)
GLUCOSE: 137 mg/dL — AB (ref 65–99)
Potassium: 4.4 mmol/L (ref 3.5–5.1)
SODIUM: 139 mmol/L (ref 135–145)

## 2014-10-07 LAB — GLUCOSE, CAPILLARY
Glucose-Capillary: 118 mg/dL — ABNORMAL HIGH (ref 65–99)
Glucose-Capillary: 190 mg/dL — ABNORMAL HIGH (ref 65–99)
Glucose-Capillary: 312 mg/dL — ABNORMAL HIGH (ref 65–99)
Glucose-Capillary: 213 mg/dL — ABNORMAL HIGH (ref 65–99)

## 2014-10-07 LAB — CARBOXYHEMOGLOBIN
Carboxyhemoglobin: 0.9 % (ref 0.5–1.5)
Carboxyhemoglobin: 1.3 % (ref 0.5–1.5)
METHEMOGLOBIN: 0.9 % (ref 0.0–1.5)
METHEMOGLOBIN: 1.2 % (ref 0.0–1.5)
O2 SAT: 55.3 %
O2 Saturation: 61.3 %
TOTAL HEMOGLOBIN: 14 g/dL (ref 12.0–16.0)
TOTAL HEMOGLOBIN: 11.6 g/dL — AB (ref 12.0–16.0)

## 2014-10-07 LAB — MAGNESIUM: Magnesium: 1.4 mg/dL — ABNORMAL LOW (ref 1.7–2.4)

## 2014-10-07 MED ORDER — POTASSIUM CHLORIDE CRYS ER 20 MEQ PO TBCR
20.0000 meq | EXTENDED_RELEASE_TABLET | Freq: Two times a day (BID) | ORAL | Status: DC
  Administered 2014-10-07 – 2014-10-08 (×2): 20 meq via ORAL
  Filled 2014-10-07 (×3): qty 1

## 2014-10-07 MED ORDER — MAGNESIUM SULFATE 4 GM/100ML IV SOLN
4.0000 g | Freq: Once | INTRAVENOUS | Status: AC
  Administered 2014-10-07: 4 g via INTRAVENOUS
  Filled 2014-10-07: qty 100

## 2014-10-07 NOTE — Progress Notes (Signed)
Advanced Heart Failure Rounding Note  Primary Cardiologist: Hochrein  Subjective:    Feeling better today. Denies SOB, Orthopnea or CP. Has PICC in place.  No CVP connected.  Says she wants to take some time off work after d/c to recuperate.   Out 4 L yesterday, weight shows down 11 lbs. Coox 55.3%.  Cr improved  2.58 -> 2.69 -> 2.24 -> 1.87 -> 1.61. K 4.4   Objective:   Weight Range: 181 lb 14.1 oz (82.5 kg) Body mass index is 28.48 kg/(m^2).   Vital Signs:   Temp:  [97.9 F (36.6 C)-99.5 F (37.5 C)] 98.7 F (37.1 C) (08/12 0751) Pulse Rate:  [102-125] 105 (08/12 0751) Resp:  [11-28] 13 (08/12 0751) BP: (89-117)/(44-77) 89/48 mmHg (08/12 0751) SpO2:  [80 %-100 %] 99 % (08/12 0751) Weight:  [181 lb 14.1 oz (82.5 kg)] 181 lb 14.1 oz (82.5 kg) (08/12 0500) Last BM Date: 10/06/14  Weight change: Filed Weights   10/05/14 0500 10/06/14 0345 10/07/14 0500  Weight: 192 lb 14.4 oz (87.5 kg) 192 lb 7.4 oz (87.3 kg) 181 lb 14.1 oz (82.5 kg)    Intake/Output:   Intake/Output Summary (Last 24 hours) at 10/07/14 0859 Last data filed at 10/07/14 0500  Gross per 24 hour  Intake    880 ml  Output   4853 ml  Net  -3973 ml     Physical Exam: General: Elderly woman. Lying flat in bed without difficulty. Fatigued appearing. No resp difficulty HEENT: normal Neck: supple. JVP 6-7 Carotids 2+ bilat; no bruits. No lymphadenopathy or thryomegaly appreciated. RIJ Swan Cor: PMI laterally displaced. Regular Tachy. +s3 Lungs: CTA Abdomen: soft, nontender, nondistended. No hepatosplenomegaly. No bruits or masses. Good bowel sounds. Extremities: no cyanosis, clubbing, rash, tr edema Neuro: alert & orientedx3, cranial nerves grossly intact. moves all 4 extremities w/o difficulty. Affect pleasant   Telemetry: NSR 90s with occasional PVCs  Labs: CBC  Recent Labs  10/05/14 0541 10/06/14 0410  WBC 7.0 7.6  HGB 9.9* 10.3*  HCT 28.9* 30.5*  MCV 90.6 90.8  PLT 167 681   Basic  Metabolic Panel  Recent Labs  10/06/14 0410 10/07/14 0500  NA 141 139  K 3.4* 4.4  CL 109 104  CO2 24 27  GLUCOSE 89 137*  BUN 42* 28*  CALCIUM 8.2* 8.8*  MG  --  1.4*   Liver Function Tests No results for input(s): AST, ALT, ALKPHOS, BILITOT, PROT, ALBUMIN in the last 72 hours. No results for input(s): LIPASE, AMYLASE in the last 72 hours. Cardiac Enzymes No results for input(s): CKTOTAL, CKMB, CKMBINDEX, TROPONINI in the last 72 hours.  BNP: BNP (last 3 results)  Recent Labs  04/18/14 1302 06/13/14 0349 10/02/14 1750  BNP >4500.0* 409.6* 2880.4*    ProBNP (last 3 results) No results for input(s): PROBNP in the last 8760 hours.   D-Dimer No results for input(s): DDIMER in the last 72 hours. Hemoglobin A1C No results for input(s): HGBA1C in the last 72 hours. Fasting Lipid Panel No results for input(s): CHOL, HDL, LDLCALC, TRIG, CHOLHDL, LDLDIRECT in the last 72 hours. Thyroid Function Tests No results for input(s): TSH, T4TOTAL, T3FREE, THYROIDAB in the last 72 hours.  Invalid input(s): FREET3  Other results:     Imaging/Studies:  No results found.  Latest Echo LV EF: 20% -  25%, Grade 3 DD  Latest Cath  RHC 10/04/14 RA = 13 RV = 46/8/15 PA = 46/20 (31) PCW = 16 Fick cardiac output/index =  2.7/1.4 PVR = 5.5 WU SVR = 1540  FA sat = 98% PA sat = 36%, 37% Noninvasive BP = 82/57 (65)    Medications:     Scheduled Medications: . apixaban  5 mg Oral BID  . benzonatate  100 mg Oral BID  . fluticasone  2 spray Each Nare Daily  . furosemide  80 mg Oral BID  . insulin aspart  0-15 Units Subcutaneous TID WC  . insulin aspart  0-5 Units Subcutaneous QHS  . loratadine  10 mg Oral Daily  . pantoprazole  40 mg Oral Daily  . potassium chloride  40 mEq Oral BID  . sodium chloride  10-40 mL Intracatheter Q12H  . spironolactone  12.5 mg Oral Daily    Infusions: . sodium chloride Stopped (10/05/14 1302)  . milrinone 0.25 mcg/kg/min (10/06/14  1000)    PRN Medications: acetaminophen, ondansetron **OR** ondansetron (ZOFRAN) IV, sodium chloride   Assessment/Plan    1. Acute on chronic systolic HF -> cardiogenic shock 2. Acute on chronic combined HF  --EF 20-25%, Grade 3 DD Echo 2/16 3. Recent ischemic colitis 4. CAD s/p CABG 2004 5. Acute on chronic renal failure, stage 4  Brisk diuresis with increased lasix at 80 TID yesterday. Down to BID today.  Fluid status improved.  No CVP attached. Will reorder.  On stepdown, PICC placed and being considered for home milrinone.  On milrinone 0.25. Coox 55.3%. Hold BB with cardiogenic shock.   Continue to monitor coox.  May need to increase Milrinone if remains below 60.   Length of Stay: 5 Shirley Friar PA-C 10/07/2014, 8:59 AM  Advanced Heart Failure Team Pager [phone] (M-F; 7a - 4p)  Please contact [facility] for night-coverage after hours (4p -7a ) and weekends on [email or web address]   Patient seen and examined with Oda Kilts, PA-C. We discussed all aspects of the encounter. I agree with the assessment and plan as stated above.   Much improved on milrinone. Will need home inotropes. BP too low to add on any further HF treatments. Switch lasix to po. Continue apixaban. Hopefully home in am.   Bensimhon, Daniel,MD 2:14 PM

## 2014-10-07 NOTE — Telephone Encounter (Signed)
Received [facility] Attending Provider Statement for Dr Percival Spanish to complete and sign via fax.Durene Cal to [facility] to send letter to patient for processing instructions/letter.  Sent to [facility] by courier on 10/07/14. lp

## 2014-10-08 ENCOUNTER — Encounter (HOSPITAL_COMMUNITY): Payer: Self-pay | Admitting: Physician Assistant

## 2014-10-08 LAB — COMPREHENSIVE METABOLIC PANEL
ALK PHOS: 156 U/L — AB (ref 38–126)
ALT: 185 U/L — ABNORMAL HIGH (ref 14–54)
AST: 68 U/L — AB (ref 15–41)
Albumin: 3.1 g/dL — ABNORMAL LOW (ref 3.5–5.0)
Anion gap: 9 (ref 5–15)
BUN: 26 mg/dL — ABNORMAL HIGH (ref 6–20)
CHLORIDE: 99 mmol/L — AB (ref 101–111)
CO2: 28 mmol/L (ref 22–32)
Calcium: 9.1 mg/dL (ref 8.9–10.3)
Creatinine, Ser: 1.72 mg/dL — ABNORMAL HIGH (ref 0.44–1.00)
GFR, EST AFRICAN AMERICAN: 34 mL/min — AB (ref >60)
GFR, EST NON AFRICAN AMERICAN: 29 mL/min — AB (ref >60)
Glucose, Bld: 163 mg/dL — ABNORMAL HIGH (ref 65–99)
Potassium: 4.7 mmol/L (ref 3.5–5.1)
Sodium: 136 mmol/L (ref 135–145)
TOTAL PROTEIN: 6.1 g/dL — AB (ref 6.5–8.1)
Total Bilirubin: 1.2 mg/dL (ref 0.3–1.2)

## 2014-10-08 LAB — GLUCOSE, CAPILLARY
GLUCOSE-CAPILLARY: 109 mg/dL — AB (ref 65–99)
Glucose-Capillary: 232 mg/dL — ABNORMAL HIGH (ref 65–99)
Glucose-Capillary: 296 mg/dL — ABNORMAL HIGH (ref 65–99)

## 2014-10-08 LAB — CARBOXYHEMOGLOBIN
Carboxyhemoglobin: 1.1 % (ref 0.5–1.5)
METHEMOGLOBIN: 0.8 % (ref 0.0–1.5)
O2 Saturation: 58.3 %
TOTAL HEMOGLOBIN: 11.9 g/dL — AB (ref 12.0–16.0)

## 2014-10-08 MED ORDER — SPIRONOLACTONE 25 MG PO TABS: 12.5000 mg | ORAL_TABLET | Freq: Every day | ORAL | Status: DC

## 2014-10-08 MED ORDER — POTASSIUM CHLORIDE CRYS ER 20 MEQ PO TBCR: 20.0000 meq | EXTENDED_RELEASE_TABLET | Freq: Every day | ORAL | Status: DC

## 2014-10-08 MED ORDER — FUROSEMIDE 40 MG PO TABS: 40.0000 mg | ORAL_TABLET | Freq: Every day | ORAL | Status: DC

## 2014-10-08 MED ORDER — MILRINONE IN DEXTROSE 20 MG/100ML IV SOLN: 0.2500 ug/kg/min | INTRAVENOUS | Status: DC

## 2014-10-08 NOTE — Progress Notes (Signed)
Pt IV drips off. Home health RN to hook up home Milrinone.  Family at bedside.  Education done.  Will continue to monitor. Saunders Revel T

## 2014-10-08 NOTE — Progress Notes (Signed)
Case management called to informed RN home health will be unable to get to bedside until ~1700 tonight due to their case load. Pt informed. Will continue to monitor and assess pt.

## 2014-10-08 NOTE — Discharge Summary (Signed)
Discharge Summary   Patient ID: Jo Lynn MRN: [id number], DOB/AGE: 11-11-1945 69 y.o. Admit date: 10/02/2014 D/C date:     10/08/2014  Primary Care Provider: Chevis Pretty, FNP Primary Cardiologist: Hochrein/CHF-Yolunda Kloos   Primary Discharge Diagnoses:  1. Acute on chronic systolic HF -> cardiogenic shock 2. Acute on chronic combined HF  --EF 20-25%, Grade 3 DD Echo 2/16 3. Recent ischemic colitis 4. CAD s/p CABG 2004; Inferior STEMI 08/2013 s/p BMS to occluded SVG-OM1-OM2, and Unsuccessful attempt at [facility] on proximal 100% SVG-RCA; Inferoposterior STEMI 05/2014 s/p Scoring balloon PTCA to dSVG-Cx 5. Acute on chronic renal failure, stage 4 6. Lactic acidosis 7. Transaminitis 8. Mild pulm HTN by cath 10/04/14 9. Hypomagnesemia, repleted  Updated PMH:  Past Medical History  Diagnosis Date  . Myocardial infarction 10/2002    a. 2004 - MV CAD --> Referred for CABG.  . 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 s/p CABGx4 in 2004 with LIMA-LAD, SVG-RPDA, SVG-OM1-OM2. b. Inferior STEMI 08/2013 s/p BMS to occluded SVG-OM1-OM2, and Unsuccessful attempt at [facility] on proximal 100% SVG-RCA. c. Inferoposterior STEMI 05/2014 s/p Scoring balloon PTCA to dSVG-Cx.  . 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 Lucianne Lei Trigt: LIMA-LAD, SVG-RPDA, SVG-OM1-OM2  . Cardiomyopathy, ischemic   . Combined systolic and diastolic congestive heart failure, NYHA class 3   . Essential hypertension   . Hyperlipidemia with target LDL less than 70   . Diabetes mellitus type 2 with complications   . 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 Lovena Le  . Colitis   . Cardiogenic shock     a. Initiated on milrinone 09/2014.  . Ischemic colitis 09/2014  . CKD (chronic kidney disease), stage IV   . Transaminitis   . Mild pulmonary hypertension     a. by [place] 09/2014.     Hospital Course: Jo Lynn is a 69 y/o woman with h/o DM2, HTN, CAD s/p CABG 2004 with inferior STEMI in 08/2013 and  inferoposterior STEMI 05/2014, PAF, CKD, chronic systolic HF with EF ~82%, recent ischemic colitis who presented to [facility] with recurrent abdominal pain, marked fatigue, cough, persistent anorexia and orthopnea. At baseline previously she was doing very well despite her severe cardiomyopathy working 12 hour days in [employer].She was admitted earlier this month with intermittent abdominal pain, nausea, anorexia. Denied BRBPR. Had CT scan of abdomen which suggested ischemic colitis (versus infectious). Given fluids and abx and felt better. Was discharged home and returned with recurrent ab pain, marked fatigue, cough, persistent anorexia and orthopnea. She denied any weight gain, significant edema, or CP. Labs notable for Cr 2.7 (was 1.5-2.0 in 4/16), lactate 4.3, elevated LFTS, BNP 2880 (up from 409 in May). EKG generally unchanged from prior. CXR nonacute. Dr. Haroldine Laws felt her findings pointed towards low output heart failure with multi-organ hypoperfusion (probable cold-dry physiology). Apixaban was held and she was placed on heparin. He recommended RHC which was done 10/04/14 showing 1) Severely depressed cardiac output with normal left-sided filling pressures consistent with cardiogenic shock and "cold-dry" physiology. 2) Mild pulmonary HTN.   Initial RHC 10/04/14 RA = 13 RV = 46/8/15 PA = 46/20 (31) PCW = 16 Fick cardiac output/index = 2.7/1.4 PVR = 5.5 WU SVR = 1540  FA sat = 98% PA sat = 36%, 37% Noninvasive BP = 82/57 (65)  She was started on milrinone and moved to the CCU. BB was stopped. Hemodynamics improved on milrinone and she was put back  on Lasix. Dr. Haroldine Laws had the VAD team begin evaluation process but felt they would need to see how good renal function gets. The patient also was not certain she would want to pursue VAD. PICC was recommended on 8/10 but the patient refused. Dr. Haroldine Laws had a long conversation with her the next day about pros and cons of inotropic  support and the fact that her heart is likely no longer strong enough to support her without advanced therapies. She decided to go ahead and proceed with PICC line with eye toward home inotropes. Lasix was increased further with subsequent brisk diuresis. She received IV magnesium for hypomagnesemia. Heparin was changed back to apixaban. With combination of milrinone and Lasix, she felt clinically improved with more energy, legs not hurting as much, and no orthopnea or CP. Today she wants to go home. Coox 58.3%. Cr improved 2.58 -> 2.69 -> 2.24 -> 1.87 -> 1.61-> 1.7. She was able to be started on spironolactone but her BP remains too low to add on any further HF treatments. Lasix was changed to oral form, 40mg  daily, taking extra 40mg  daily for weight 182 or greater. LFTs are slowly improving, last check AST 68/ALT 185 (were in the 200-300 range). Dr. Haroldine Laws feels she is safe to resume her atorvastatin. He has seen and examined the patient today and feels she is stable for discharge. She will go home on Milrinone 0.25 mcg/kg/min. We have involved home health to help arrange this (handwritten rx given for this). I have sent a message to the Heart Failure clinic scheduler requesting a TOC follow-up appointment, and our office will call the patient with this information.  Of note - discordant from rounding note will be our final rec regarding aspirin. Given STEMI in 05/2014 we have elected to continue this for now.   She plans to remain out of work until cleared by her cardiologist.  Discharge Vitals: Blood pressure 97/58, pulse 109, temperature 98.8 F (37.1 C), temperature source Oral, resp. rate 11, height 5\' 7"  (1.702 m), weight 179 lb 1.6 oz (81.239 kg), SpO2 96 %.  Labs: Lab Results  Component Value Date   WBC 7.6 10/06/2014   HGB 10.3* 10/06/2014   HCT 30.5* 10/06/2014   MCV 90.8 10/06/2014   PLT 175 10/06/2014    Recent Labs Lab 10/08/14 0450  NA 136  K 4.7  CL 99*  CO2 28  BUN 26*    CREATININE 1.72*  CALCIUM 9.1  PROT 6.1*  BILITOT 1.2  ALKPHOS 156*  ALT 185*  AST 68*  GLUCOSE 163*    Lab Results  Component Value Date   CHOL 229* 01/10/2014   HDL 38* 01/10/2014   LDLCALC 136* 09/10/2013   TRIG 114 01/10/2014    Diagnostic Studies/Procedures   Ct Abdomen Pelvis Wo Contrast  09/28/2014   CLINICAL DATA:  Emesis and diarrhea, onset yesterday.  EXAM: CT ABDOMEN AND PELVIS WITHOUT CONTRAST  TECHNIQUE: Multidetector CT imaging of the abdomen and pelvis was performed following the standard protocol without IV contrast.  COMPARISON:  Ultrasound 04/18/2014  FINDINGS: There is marked mural thickening and pericolonic inflammatory change involving the ascending colon with appearances consistent with colitis or ischemia. There is no obstruction. There is no extraluminal air. The remainder of the colon is remarkable only for mild diverticulosis. Small bowel is unremarkable. Stomach is unremarkable.  There is massive enlargement of the uterus with numerous calcified fibroids. The uterus measures approximately 14 x 15 by 19 cm.  There is  a 1.9 cm within the gallbladder lumen. There is no bile duct dilatation. There are unremarkable unenhanced appearances of the liver, pancreas, spleen and adrenals.  There is a 3 mm upper pole right collecting system calculus and a 2 mm lower pole right collecting system calculus. There is a 2 mm lower pole left collecting system calculus. There is a 6 cm upper pole left renal cyst. No ureteral calculi are evident.  The abdominal aorta is normal in caliber. There is moderate atherosclerotic calcification. There is no adenopathy in the abdomen or pelvis.  There is no significant skeletal lesion. Moderately severe degenerative disc changes are present in the lower lumbar spine and lumbosacral junction.  IMPRESSION: 1. Marked mural thickening and pericolonic inflammatory change involving the ascending colon, consistent with colitis or ischemia. No obstruction.  No extraluminal air. 2. Diverticulosis 3. Cholelithiasis 4. Nephrolithiasis 5. Markedly enlarged uterus containing numerous calcified fibroid.   Electronically Signed   By: Andreas Newport M.D.   On: 09/28/2014 06:05   Dg Chest 2 View  10/02/2014   CLINICAL DATA:  Patient recently discharged from hospital complaining of shortness of breath and nausea.  EXAM: CHEST  2 VIEW  COMPARISON:  Chest radiograph 06/13/2014  FINDINGS: Single lead AICD device overlies the left hemi thorax. Stable cardiomegaly status post median sternotomy and CABG procedure. No consolidative pulmonary opacities. No pleural effusion or pneumothorax.  IMPRESSION: No acute cardiopulmonary process.   Electronically Signed   By: Lovey Newcomer M.D.   On: 10/02/2014 17:37   Dg Chest Port 1 View  10/04/2014   CLINICAL DATA:  Central line placement  EXAM: PORTABLE CHEST - 1 VIEW  COMPARISON:  10/02/2014  FINDINGS: Right jugular Swan-Ganz catheter has been placed with tip in the right lower lobe pulmonary artery. No pneumothorax  Cardiac enlargement without heart failure. Negative for infiltrate or effusion. No edema. AICD lead in the right ventricle unchanged.  IMPRESSION: Swan-Ganz catheter tip in the right lower lobe pulmonary artery. No pneumothorax  Negative for heart failure.   Electronically Signed   By: Franchot Gallo M.D.   On: 10/04/2014 10:21   US Abdomen Limited Ruq  10/03/2014   CLINICAL DATA:  Transaminitis  EXAM: US ABDOMEN LIMITED - RIGHT UPPER QUADRANT  COMPARISON:  Abdominal CT scan of September 28, 2014 and abdominal ultrasound of April 18, 2014.  FINDINGS: Gallbladder:  Again demonstrated is an echogenic shadowing stone. The gallbladder wall is mildly thickened at 5 mm. There is no pericholecystic fluid or positive sonographic Murphy's sign.  Common bile duct:  Diameter: 3.1 mm  Liver:  No focal lesion identified. Within normal limits in parenchymal echogenicity.  IMPRESSION: There is a large gallstone and there is chronic mild  gallbladder wall thickening. There is no sonographic evidence of acute cholecystitis. The liver and common bile duct are normal.   Electronically Signed   By: David  Martinique M.D.   On: 10/03/2014 08:38   RHC as above.   Discharge Medications   Current Discharge Medication List    START taking these medications   Details  milrinone (PRIMACOR) 20 MG/100ML SOLN infusion Inject 20.3 mcg/min into the vein continuous. Rate: 0.25 mcg/kg/min.    potassium chloride SA (K-DUR,KLOR-CON) 20 MEQ tablet Take 1 tablet (20 mEq total) by mouth daily. Qty: 30 tablet, Refills: 3    spironolactone (ALDACTONE) 25 MG tablet Take 0.5 tablets (12.5 mg total) by mouth daily. Qty: 30 tablet, Refills: 3      CONTINUE these medications which have  CHANGED   Details  furosemide (LASIX) 40 MG tablet Take 1 tablet (40 mg total) by mouth daily. May take an extra 40mg  once a day if weight is 182 pounds or greater. Qty: 45 tablet, Refills: 3   Associated Diagnoses: Acute on chronic systolic CHF (congestive heart failure)      CONTINUE these medications which have NOT CHANGED   Details  allopurinol (ZYLOPRIM) 100 MG tablet Take 0.5 tablets (50 mg total) by mouth 2 (two) times daily.    apixaban (ELIQUIS) 5 MG TABS tablet Take 1 tablet (5 mg total) by mouth 2 (two) times daily.     aspirin EC 81 MG tablet Take 1 tablet (81 mg total) by mouth daily.    atorvastatin (LIPITOR) 40 MG tablet Take 1 tablet (40 mg total) by mouth at bedtime.     sitaGLIPtin (JANUVIA) 25 MG tablet Take 1 tablet (25 mg total) by mouth daily.    Associated Diagnoses: Type 2 diabetes mellitus without complication    glimepiride (AMARYL) 2 MG tablet Take 1 tablet (2 mg total) by mouth daily with breakfast.     pantoprazole (PROTONIX) 40 MG tablet Take 1 tablet (40 mg total) by mouth at bedtime.       STOP taking these medications     carvedilol (COREG) 3.125 MG tablet      nitroGLYCERIN (NITROSTAT) 0.4 MG SL tablet       ciprofloxacin (CIPRO) 500 MG tablet - expired, not continued here      fluticasone (FLONASE) 50 MCG/ACT nasal spray - pt not taking      lidocaine (LMX) 4 % cream - pt not taking      metroNIDAZOLE (FLAGYL) 500 MG tablet - expired, not continued here         Disposition   The patient will be discharged in stable condition to home. Discharge Instructions    Diet - low sodium heart healthy    Complete by:  As directed      Increase activity slowly    Complete by:  As directed   Medications that were STOPPED: nitroglycerin, carvedilol Medications that were CHANGED: Lasix (furosemide) NEW medicines: milrinone, spironolactone, potassium It also looks like your primary care doctor also recently added a medicine called glimepiride to your diabetes regimen.          Follow-up Information    Follow up with Glori Bickers, MD.   Specialty:  Cardiology   Why:  Office will call you for your followup appointment. Call office if you have not heard back in 3 days.   Contact information:   [place] [address] [phone]       Follow up with Chevis Pretty, [place].   Specialty:  Nurse Practitioner   Why:  For your diabetes management   Contact information:   [place] [address] [phone]         Duration of Discharge Encounter: Greater than 30 minutes including physician and PA time.  Raechel Ache PA-C 10/08/2014, 10:23 AM  Patient seen and examined with Melina Copa, PA-C. We discussed all aspects of the encounter. I agree with the assessment and plan as stated above. She is ready for d/c on home milrinone. Will follow closely in HF Clinic.   Sharayah Renfrow,MD 3:13 PM

## 2014-10-08 NOTE — Care Management Note (Signed)
Case Management Note  Patient Details  Name: Jo Lynn MRN: [id number] Date of Birth: November 01, 1945  Subjective/Objective:                   Nausea, weakness, decreased appetite 3 days. Action/Plan: Discharge planning  Expected Discharge Date:  10/11/14                Expected Discharge Plan:  [place]  In-House Referral:     Discharge planning Services  CM Consult (Home Milnrinone)  Post Acute Care Choice:  Home Health Choice offered to:     DME Arranged:  IV pump/equipment DME Agency:     HH Arranged:  RN (Home Infusion) [place]:  Tool  Status of Service:  Completed, signed off  Medicare Important Message Given:    Date Medicare IM Given:    Medicare IM give by:    Date Additional Medicare IM Given:    Additional Medicare Important Message give by:     If discussed at [place] of Stay Meetings, dates discussed:    Additional Comments: CM received call from RN to please arrange milrinone.  CM called [facility] rep, Tiffany to please arrange for nurse to come to pt's room for hookup.  [place] RN to arrive to room between 17:00 and 19:00 this evening to hook pt up to pump.  No other CM needs were communicated. Dellie Catholic, RN 10/08/2014, 2:18 PM

## 2014-10-08 NOTE — Progress Notes (Signed)
Advanced Heart Failure Rounding Note  Primary Cardiologist: Hochrein  Subjective:    69 y/o with long h/o CHF due to ischemic CM with EF 20%. Resented with abdominal pain, fatigue and recent ischemic colitis. Found to have cardiogenic shock. Improved on milrinone.   Initial RHC 10/04/14 RA = 13 RV = 46/8/15 PA = 46/20 (31) PCW = 16 Fick cardiac output/index = 2.7/1.4 PVR = 5.5 WU SVR = 1540  FA sat = 98% PA sat = 36%, 37% Noninvasive BP = 82/57 (65)  Feels well. No dyspnea. Wants to go home. Creatinine stable   Coox 58.3%.  Cr improved  2.58 -> 2.69 -> 2.24 -> 1.87 -> 1.61-> 1.7   Objective:   Weight Range: 81.239 kg (179 lb 1.6 oz) Body mass index is 28.04 kg/(m^2).   Vital Signs:   Temp:  [97.9 F (36.6 C)-98.8 F (37.1 C)] 98.8 F (37.1 C) (08/13 0810) Pulse Rate:  [102-203] 109 (08/13 0800) Resp:  [10-28] 11 (08/13 0800) BP: (84-164)/(44-126) 97/58 mmHg (08/13 0800) SpO2:  [94 %-100 %] 96 % (08/13 0800) Weight:  [81.239 kg (179 lb 1.6 oz)] 81.239 kg (179 lb 1.6 oz) (08/13 0400) Last BM Date: 10/07/14  Weight change: Filed Weights   10/06/14 0345 10/07/14 0500 10/08/14 0400  Weight: 87.3 kg (192 lb 7.4 oz) 82.5 kg (181 lb 14.1 oz) 81.239 kg (179 lb 1.6 oz)    Intake/Output:   Intake/Output Summary (Last 24 hours) at 10/08/14 0916 Last data filed at 10/08/14 0800  Gross per 24 hour  Intake  259.5 ml  Output   2750 ml  Net -2490.5 ml     Physical Exam: General: Elderly woman. Lying flat in bed without difficulty. Fatigued appearing. No resp difficulty HEENT: normal Neck: supple. JVP 6-7 Carotids 2+ bilat; no bruits. No lymphadenopathy or thryomegaly appreciated. RIJ Swan Cor: PMI laterally displaced. Regular Tachy. +s3 Lungs: CTA Abdomen: soft, nontender, nondistended. No hepatosplenomegaly. No bruits or masses. Good bowel sounds. Extremities: no cyanosis, clubbing, rash, tr edema Neuro: alert & orientedx3, cranial nerves grossly intact. moves all 4  extremities w/o difficulty. Affect pleasant   Telemetry: NSR 90s with occasional PVCs  Labs: CBC  Recent Labs  10/06/14 0410  WBC 7.6  HGB 10.3*  HCT 30.5*  MCV 90.8  PLT 465   Basic Metabolic Panel  Recent Labs  10/07/14 0500 10/08/14 0450  NA 139 136  K 4.4 4.7  CL 104 99*  CO2 27 28  GLUCOSE 137* 163*  BUN 28* 26*  CALCIUM 8.8* 9.1  MG 1.4*  --    Liver Function Tests  Recent Labs  10/08/14 0450  AST 68*  ALT 185*  ALKPHOS 156*  BILITOT 1.2  PROT 6.1*  ALBUMIN 3.1*   No results for input(s): LIPASE, AMYLASE in the last 72 hours. Cardiac Enzymes No results for input(s): CKTOTAL, CKMB, CKMBINDEX, TROPONINI in the last 72 hours.  BNP: BNP (last 3 results)  Recent Labs  04/18/14 1302 06/13/14 0349 10/02/14 1750  BNP >4500.0* 409.6* 2880.4*    ProBNP (last 3 results) No results for input(s): PROBNP in the last 8760 hours.   D-Dimer No results for input(s): DDIMER in the last 72 hours. Hemoglobin A1C No results for input(s): HGBA1C in the last 72 hours. Fasting Lipid Panel No results for input(s): CHOL, HDL, LDLCALC, TRIG, CHOLHDL, LDLDIRECT in the last 72 hours. Thyroid Function Tests No results for input(s): TSH, T4TOTAL, T3FREE, THYROIDAB in the last 72 hours.  Invalid input(s): FREET3  Other results:     Imaging/Studies:  No results found.  Latest Echo LV EF: 20% -  25%, Grade 3 DD  Latest Cath      Medications:     Scheduled Medications: . apixaban  5 mg Oral BID  . benzonatate  100 mg Oral BID  . fluticasone  2 spray Each Nare Daily  . furosemide  80 mg Oral BID  . insulin aspart  0-15 Units Subcutaneous TID WC  . insulin aspart  0-5 Units Subcutaneous QHS  . loratadine  10 mg Oral Daily  . pantoprazole  40 mg Oral Daily  . potassium chloride  20 mEq Oral BID  . sodium chloride  10-40 mL Intracatheter Q12H  . spironolactone  12.5 mg Oral Daily    Infusions: . sodium chloride 10 mL/hr at 10/07/14 1029  .  milrinone 0.25 mcg/kg/min (10/08/14 0729)    PRN Medications: acetaminophen, ondansetron **OR** ondansetron (ZOFRAN) IV, sodium chloride   Assessment/Plan    1. Acute on chronic systolic HF -> cardiogenic shock 2. Acute on chronic combined HF  --EF 20-25%, Grade 3 DD Echo 2/16 3. Recent ischemic colitis 4. CAD s/p CABG 2004 5. Acute on chronic renal failure, stage 4  Much improved on milrinone. Will need home inotropes. BP too low to add on any further HF treatments. Switch lasix to po. Continue apixaban. Home today on following meds.   Milrinone 0.25 mcg/kg/min Lasix 40 daily take extra 40 for weight 182 or greater Atorvastatin 40 Apixaban 5 bid (stop asa) Allopurinol 50 bid Januvia  Mathias Bogacki,MD 9:16 AM Advanced Heart Failure Team Pager [phone] (M-F; 7a - 4p)  Please contact [facility] for night-coverage after hours (4p -7a ) and weekends on [email or web address]

## 2014-10-11 ENCOUNTER — Encounter: Payer: BLUE CROSS/BLUE SHIELD | Admitting: *Deleted

## 2014-10-11 ENCOUNTER — Telehealth (HOSPITAL_COMMUNITY): Payer: Self-pay | Admitting: Vascular Surgery

## 2014-10-11 ENCOUNTER — Telehealth: Payer: Self-pay | Admitting: Cardiology

## 2014-10-11 NOTE — Telephone Encounter (Signed)
LMOVM reminding pt to send remote transmission.   

## 2014-10-11 NOTE — Telephone Encounter (Signed)
Spoke to pt , she has to speak with her daughter before she can make appt, she will get her daughter to call to make appt when she can

## 2014-10-12 ENCOUNTER — Encounter: Payer: Self-pay | Admitting: Cardiology

## 2014-10-17 ENCOUNTER — Inpatient Hospital Stay (HOSPITAL_COMMUNITY): Payer: BLUE CROSS/BLUE SHIELD

## 2014-10-17 ENCOUNTER — Other Ambulatory Visit (HOSPITAL_COMMUNITY): Payer: Self-pay | Admitting: *Deleted

## 2014-10-17 ENCOUNTER — Telehealth: Payer: Self-pay | Admitting: Cardiology

## 2014-10-17 DIAGNOSIS — I5023 Acute on chronic systolic (congestive) heart failure: Secondary | ICD-10-CM

## 2014-10-17 MED ORDER — FUROSEMIDE 40 MG PO TABS: 40.0000 mg | ORAL_TABLET | ORAL | Status: DC

## 2014-10-17 NOTE — Telephone Encounter (Signed)
Left vm for pt to call back for lab results.   Pt needs to stop K... Change lasix to every other day... [place] will recheck bmet Friday. (per Dr.Bensimhon)

## 2014-10-17 NOTE — Telephone Encounter (Signed)
Received [facility] Attending Provider Statement back from [employer] @ [facility].  Given to N. Thomas for Dr Percival Spanish to review and sign. lp

## 2014-10-18 NOTE — Telephone Encounter (Signed)
Left vm for pt again today to call for lab results.

## 2014-10-23 ENCOUNTER — Encounter (HOSPITAL_COMMUNITY): Payer: Self-pay | Admitting: Nurse Practitioner

## 2014-10-23 ENCOUNTER — Emergency Department (HOSPITAL_COMMUNITY)
Admission: EM | Admit: 2014-10-23 | Discharge: 2014-10-23 | Disposition: A | Discharge status: Home / Self Care | Payer: BLUE CROSS/BLUE SHIELD | Attending: Emergency Medicine | Admitting: Emergency Medicine

## 2014-10-23 DIAGNOSIS — Z79899 Other long term (current) drug therapy: Secondary | ICD-10-CM | POA: Insufficient documentation

## 2014-10-23 DIAGNOSIS — I252 Old myocardial infarction: Secondary | ICD-10-CM | POA: Diagnosis not present

## 2014-10-23 DIAGNOSIS — I5040 Unspecified combined systolic (congestive) and diastolic (congestive) heart failure: Secondary | ICD-10-CM | POA: Diagnosis present

## 2014-10-23 DIAGNOSIS — N184 Chronic kidney disease, stage 4 (severe): Secondary | ICD-10-CM | POA: Diagnosis not present

## 2014-10-23 DIAGNOSIS — I129 Hypertensive chronic kidney disease with stage 1 through stage 4 chronic kidney disease, or unspecified chronic kidney disease: Secondary | ICD-10-CM | POA: Diagnosis not present

## 2014-10-23 DIAGNOSIS — E119 Type 2 diabetes mellitus without complications: Secondary | ICD-10-CM | POA: Insufficient documentation

## 2014-10-23 DIAGNOSIS — Z87891 Personal history of nicotine dependence: Secondary | ICD-10-CM | POA: Diagnosis not present

## 2014-10-23 DIAGNOSIS — I2510 Atherosclerotic heart disease of native coronary artery without angina pectoris: Secondary | ICD-10-CM | POA: Diagnosis not present

## 2014-10-23 DIAGNOSIS — I5022 Chronic systolic (congestive) heart failure: Secondary | ICD-10-CM

## 2014-10-23 DIAGNOSIS — Z951 Presence of aortocoronary bypass graft: Secondary | ICD-10-CM | POA: Insufficient documentation

## 2014-10-23 DIAGNOSIS — Z7982 Long term (current) use of aspirin: Secondary | ICD-10-CM | POA: Insufficient documentation

## 2014-10-23 DIAGNOSIS — E785 Hyperlipidemia, unspecified: Secondary | ICD-10-CM | POA: Diagnosis not present

## 2014-10-23 NOTE — ED Notes (Signed)
The pt is on milrinone infusion, her pump keeps alarming "high pressure" today. No swelling or redness around the picc insertion site. She denies pain

## 2014-10-23 NOTE — ED Notes (Signed)
MD at bedside. 

## 2014-10-23 NOTE — ED Notes (Addendum)
Patient reports receiving milironone through continuous pump. Pump has been beeping "high pressure" since Friday. Nurse changed tubing on Thursday. Patient's PICC Line was flushed and no blood return was noted on Lumen #2. Iv pump changed to Lumen #1. Pump continued to  Beep "high pressure" after change.

## 2014-10-23 NOTE — ED Provider Notes (Signed)
CSN: [id number]     Arrival date & time 10/23/14  1826 History   First MD Initiated Contact with Patient 10/23/14 1937     Chief Complaint  Patient presents with  . IV Medication     (Consider location/radiation/quality/duration/timing/severity/associated sxs/prior Treatment) HPI..... Status post perpetual infusion of  milironone via right antecubital PICC line.  Delivery apparatus has been "beeping" since Friday. Otherwise the patient has no specific complaints. Her right arm is not sore. No chest pain, dyspnea, fever, sweats, chills. She is most concerned about the apparatus not working correctly. Past Medical History  Diagnosis Date  . Myocardial infarction 10/2002    a. 2004 - MV CAD --> Referred for CABG.  . 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 s/p CABGx4 in 2004 with LIMA-LAD, SVG-RPDA, SVG-OM1-OM2. b. Inferior STEMI 08/2013 s/p BMS to occluded SVG-OM1-OM2, and Unsuccessful attempt at [facility] on proximal 100% SVG-RCA. c. Inferoposterior STEMI 05/2014 s/p Scoring balloon PTCA to dSVG-Cx.  . 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 Lucianne Lei Trigt: LIMA-LAD, SVG-RPDA, SVG-OM1-OM2  . Cardiomyopathy, ischemic   . Combined systolic and diastolic congestive heart failure, NYHA class 3   . Essential hypertension   . Hyperlipidemia with target LDL less than 70   . Diabetes mellitus type 2 with complications   . 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 Lovena Le  . Colitis   . Cardiogenic shock     a. Initiated on milrinone 09/2014.  . Ischemic colitis 09/2014  . CKD (chronic kidney disease), stage IV   . Transaminitis   . Mild pulmonary hypertension     a. by [place] 09/2014.   Past Surgical History  Procedure Laterality Date  . Coronary artery bypass graft    . Left heart catheterization with coronary angiogram N/A 09/09/2013    Procedure: LEFT HEART CATHETERIZATION WITH CORONARY ANGIOGRAM;  Surgeon: Leonie Man, MD;  Location: [facility] CATH LAB;  Service:  Cardiovascular;  Laterality: N/A;  . Implantable cardioverter defibrillator implant  02-21-2014    STJ single chamber ICD implanted by Dr Lovena Le for primary prevention  . Implantable cardioverter defibrillator implant N/A 02/21/2014    Procedure: IMPLANTABLE CARDIOVERTER DEFIBRILLATOR IMPLANT;  Surgeon: Evans Lance, MD;  Location: [facility] CATH LAB;  Service: Cardiovascular;  Laterality: N/A;  . Left heart catheterization with coronary angiogram N/A 06/13/2014    Procedure: LEFT HEART CATHETERIZATION WITH CORONARY ANGIOGRAM;  Surgeon: Jettie Booze, MD;  Location: [facility] CATH LAB;  Service: Cardiovascular;  Laterality: N/A;  . Cardiac catheterization N/A 10/04/2014    Procedure: Right Heart Cath;  Surgeon: Jolaine Artist, MD;  Location: [place] CV LAB;  Service: Cardiovascular;  Laterality: N/A;   Family History  Problem Relation Age of Onset  . Diabetes Mother   . Diabetes Father   . Heart attack Neg Hx   . Stroke Neg Hx    Social History  Substance Use Topics  . Smoking status: Former Smoker    Types: Cigarettes  . Smokeless tobacco: Never Used     Comment: quit many years ago  . Alcohol Use: No   OB History    No data available     Review of Systems  All other systems reviewed and are negative.     Allergies  Prednisone  Home Medications   Prior to Admission medications   Medication Sig Start Date End Date Taking? Authorizing Provider  allopurinol (ZYLOPRIM) 100 MG tablet Take 0.5 tablets (50 mg  total) by mouth 2 (two) times daily. 06/16/14   Ripudeep Krystal Eaton, MD  apixaban (ELIQUIS) 5 MG TABS tablet Take 1 tablet (5 mg total) by mouth 2 (two) times daily. 07/12/14   Minus Breeding, MD  aspirin EC 81 MG tablet Take 1 tablet (81 mg total) by mouth daily. 08/03/14   Minus Breeding, MD  atorvastatin (LIPITOR) 40 MG tablet Take 1 tablet (40 mg total) by mouth at bedtime. 07/12/14   Minus Breeding, MD  furosemide (LASIX) 40 MG tablet Take 1 tablet (40 mg total) by mouth every  other day. May take an extra 40mg  once a day if weight is 182 pounds or greater. 10/17/14   Jolaine Artist, MD  glimepiride (AMARYL) 2 MG tablet Take 1 tablet (2 mg total) by mouth daily with breakfast. 10/03/14   Mary-Margaret Hassell Done, FNP  milrinone [facility]) 20 MG/100ML SOLN infusion Inject 20.3 mcg/min into the vein continuous. Rate: 0.25 mcg/kg/min. 10/08/14   Dayna N Dunn, PA-C  pantoprazole (PROTONIX) 40 MG tablet Take 1 tablet (40 mg total) by mouth at bedtime. 10/03/14   Mary-Margaret Hassell Done, FNP  sitaGLIPtin (JANUVIA) 25 MG tablet Take 1 tablet (25 mg total) by mouth daily. 09/29/14   Francesca Oman, DO  spironolactone (ALDACTONE) 25 MG tablet Take 0.5 tablets (12.5 mg total) by mouth daily. 10/08/14   Dayna N Dunn, PA-C   BP 94/62 mmHg  Pulse 114  Temp(Src) 98.1 F (36.7 C) (Oral)  Resp 20  Ht 5\' 7"  (1.702 m)  Wt 176 lb 3.2 oz (79.924 kg)  BMI 27.59 kg/m2  SpO2 97% Physical Exam  Constitutional: She is oriented to person, place, and time. She appears well-developed and well-nourished.  HENT:  Head: Normocephalic and atraumatic.  Eyes: Conjunctivae and EOM are normal. Pupils are equal, round, and reactive to light.  Neck: Normal range of motion. Neck supple.  Cardiovascular: Normal rate and regular rhythm.   Pulmonary/Chest: Effort normal and breath sounds normal.  Abdominal: Soft. Bowel sounds are normal.  Musculoskeletal: Normal range of motion.  Neurological: She is alert and oriented to person, place, and time.  Skin: Skin is warm and dry.  PICC insertion site at right antecubital area nontender. No erythema  Psychiatric: She has a normal mood and affect. Her behavior is normal.  Nursing note and vitals reviewed.   ED Course  Procedures (including critical care time) Labs Review Labs Reviewed - No data to display  Imaging Review No results found. I have personally reviewed and evaluated these images and lab results as part of my medical decision-making.   EKG  Interpretation None      MDM   Final diagnoses:  Chronic systolic heart failure    Medication delivery intravenous line appears to be kinked off. I was able to alter the backpack to allow for more laminar flow of intravenous fluid. Patient will follow-up with advanced home care tomorrow    Nat Christen, MD 10/23/14 2053

## 2014-10-23 NOTE — Discharge Instructions (Signed)
Call Raoul in the morning for follow-up

## 2014-10-24 ENCOUNTER — Encounter (HOSPITAL_COMMUNITY): Payer: Self-pay | Admitting: *Deleted

## 2014-10-28 ENCOUNTER — Other Ambulatory Visit (HOSPITAL_COMMUNITY): Payer: Self-pay | Admitting: Internal Medicine

## 2014-11-01 ENCOUNTER — Telehealth: Payer: Self-pay | Admitting: Cardiology

## 2014-11-01 NOTE — Telephone Encounter (Signed)
Received signed Attending Physician Statement Jo Lynn) back from Dr Percival Spanish.  Faxed to [employer] and notified patient form was completed and signed. lp

## 2014-11-04 ENCOUNTER — Encounter (HOSPITAL_COMMUNITY): Payer: Self-pay

## 2014-11-04 ENCOUNTER — Ambulatory Visit (HOSPITAL_COMMUNITY)
Admission: RE | Admit: 2014-11-04 | Discharge: 2014-11-04 | Disposition: A | Discharge status: Home / Self Care | Payer: BLUE CROSS/BLUE SHIELD | Source: Ambulatory Visit | Attending: Cardiology | Admitting: Cardiology

## 2014-11-04 ENCOUNTER — Telehealth (HOSPITAL_COMMUNITY): Payer: Self-pay | Admitting: Infectious Diseases

## 2014-11-04 ENCOUNTER — Telehealth (HOSPITAL_COMMUNITY): Payer: Self-pay | Admitting: Cardiology

## 2014-11-04 VITALS — BP 94/56 | HR 58 | Wt 178.4 lb

## 2014-11-04 DIAGNOSIS — I129 Hypertensive chronic kidney disease with stage 1 through stage 4 chronic kidney disease, or unspecified chronic kidney disease: Secondary | ICD-10-CM | POA: Diagnosis not present

## 2014-11-04 DIAGNOSIS — I5042 Chronic combined systolic (congestive) and diastolic (congestive) heart failure: Secondary | ICD-10-CM | POA: Insufficient documentation

## 2014-11-04 DIAGNOSIS — E785 Hyperlipidemia, unspecified: Secondary | ICD-10-CM | POA: Diagnosis not present

## 2014-11-04 DIAGNOSIS — I255 Ischemic cardiomyopathy: Secondary | ICD-10-CM | POA: Diagnosis not present

## 2014-11-04 DIAGNOSIS — Z95810 Presence of automatic (implantable) cardiac defibrillator: Secondary | ICD-10-CM | POA: Diagnosis not present

## 2014-11-04 DIAGNOSIS — N184 Chronic kidney disease, stage 4 (severe): Secondary | ICD-10-CM | POA: Diagnosis not present

## 2014-11-04 DIAGNOSIS — Z951 Presence of aortocoronary bypass graft: Secondary | ICD-10-CM | POA: Insufficient documentation

## 2014-11-04 DIAGNOSIS — I480 Paroxysmal atrial fibrillation: Secondary | ICD-10-CM | POA: Diagnosis not present

## 2014-11-04 DIAGNOSIS — I5022 Chronic systolic (congestive) heart failure: Secondary | ICD-10-CM | POA: Diagnosis not present

## 2014-11-04 DIAGNOSIS — Z79899 Other long term (current) drug therapy: Secondary | ICD-10-CM | POA: Diagnosis not present

## 2014-11-04 DIAGNOSIS — I272 Other secondary pulmonary hypertension: Secondary | ICD-10-CM | POA: Diagnosis not present

## 2014-11-04 DIAGNOSIS — I252 Old myocardial infarction: Secondary | ICD-10-CM | POA: Insufficient documentation

## 2014-11-04 DIAGNOSIS — I2510 Atherosclerotic heart disease of native coronary artery without angina pectoris: Secondary | ICD-10-CM | POA: Insufficient documentation

## 2014-11-04 DIAGNOSIS — Z833 Family history of diabetes mellitus: Secondary | ICD-10-CM | POA: Diagnosis not present

## 2014-11-04 DIAGNOSIS — Z7902 Long term (current) use of antithrombotics/antiplatelets: Secondary | ICD-10-CM | POA: Diagnosis not present

## 2014-11-04 DIAGNOSIS — Z87891 Personal history of nicotine dependence: Secondary | ICD-10-CM | POA: Insufficient documentation

## 2014-11-04 DIAGNOSIS — E1122 Type 2 diabetes mellitus with diabetic chronic kidney disease: Secondary | ICD-10-CM | POA: Diagnosis not present

## 2014-11-04 DIAGNOSIS — I509 Heart failure, unspecified: Secondary | ICD-10-CM | POA: Diagnosis not present

## 2014-11-04 LAB — CARBOXYHEMOGLOBIN
Carboxyhemoglobin: 1.5 % (ref 0.5–1.5)
Methemoglobin: 0.8 % (ref 0.0–1.5)
O2 SAT: 64.6 %
TOTAL HEMOGLOBIN: 8.9 g/dL — AB (ref 12.0–16.0)

## 2014-11-04 NOTE — Patient Instructions (Signed)
Follow up 2 weeks  Do the following things EVERYDAY: 1) Weigh yourself in the morning before breakfast. Write it down and keep it in a log. 2) Take your medicines as prescribed 3) Eat low salt foods-Limit salt (sodium) to 2000 mg per day.  4) Stay as active as you can everyday 5) Limit all fluids for the day to less than 2 liters 

## 2014-11-04 NOTE — Telephone Encounter (Signed)
[name],RN with [facility] was told by patient she would no longer need milrinone Advised patient was seen in CHF clinic today and per Dr.McLean pt should continue home milrinone however it seems like patient is in the beginning stages of a LVAD work up, pt may have gotten things confused.

## 2014-11-04 NOTE — Telephone Encounter (Signed)
Attempted to call patient to make appointment to discuss VAD therapy for next week since I was unable to meet with her after her CHF clinic appointment today. Will try again later.

## 2014-11-04 NOTE — Progress Notes (Signed)
Patient ID: Jo Lynn, female   DOB: 06-12-1945, 69 y.o.   MRN: [id number] PCP: Primary HF Cardiologist: Dr Haroldine Laws Primary Cardiologist: Dr Percival Spanish   HPI: [name] is a 69 y/o woman with h/o DM2, HTN, CAD s/p CABG 2004 with inferior STEMI in 7/15, PAF, CKD, chronic systolic HF with EF ~40%, and recent ischemic colitis.  Given STEMI in 05/2014, she remains on aspirin for now.   Admitted to [facility]  09/2014 [facility] with recurrent abdominal pain, marked fatigue, cough, persistent anorexia and orthopnea. Diuresed with IV lasix and required milrinone. Unable to wean milrinone. She remained on milrinone 0.25 mcg. [facility] followed for home milrinone.  Advanced therapies are being considered.  Discharge weight was 179 pounds.   She returns for post hospital follow up. Overall feeling ok. Poor appetite. Weight at home 173 pounds. Denies SOB/PND/Orthopnea/CP. Can climb a flight of steps.  No bleeding problems.  Taking all medications. Lives at home with her daughter.  She wants to go back to work (works in [employer]).    Horace 10/04/14 RA = 13 RV = 46/8/15 PA = 46/20 (31) PCW = 16 Fick cardiac output/index = 2.7/1.4 PVR = 5.5 WU SVR = 1540  FA sat = 98% PA sat = 36%, 37% Noninvasive BP = 82/57 (65)  ECHO 03/2014 EF 20-25%  ECHO 12/2013 25%   Corevue reviewed today: Stable thoracic impedance.   Labs (9/16): K 4.8, creatinine 1.97, HCT 38.3  ROS: All systems negative except as listed in HPI, PMH and Problem List.  SH:  Social History   Social History  . Marital Status: Single    Spouse Name: N/A  . Number of Children: 4  . Years of Education: N/A   Occupational History  . Not on file.   Social History Main Topics  . Smoking status: Former Smoker    Types: Cigarettes  . Smokeless tobacco: Never Used     Comment: quit many years ago  . Alcohol Use: No  . Drug Use: No  . Sexual Activity: Not on file   Other Topics Concern  . Not on file   Social History  Narrative    FH:  Family History  Problem Relation Age of Onset  . Diabetes Mother   . Diabetes Father   . Heart attack Neg Hx   . Stroke Neg Hx     Past Medical History  Diagnosis Date  . Myocardial infarction 10/2002    a. 2004 - MV CAD --> Referred for CABG.  . 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 s/p CABGx4 in 2004 with LIMA-LAD, SVG-RPDA, SVG-OM1-OM2. b. Inferior STEMI 08/2013 s/p BMS to occluded SVG-OM1-OM2, and Unsuccessful attempt at [facility] on proximal 100% SVG-RCA. c. Inferoposterior STEMI 05/2014 s/p Scoring balloon PTCA to dSVG-Cx.  . 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 Lucianne Lei Trigt: LIMA-LAD, SVG-RPDA, SVG-OM1-OM2  . Cardiomyopathy, ischemic   . Combined systolic and diastolic congestive heart failure, NYHA class 3   . Essential hypertension   . Hyperlipidemia with target LDL less than 70   . Diabetes mellitus type 2 with complications   . 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 Lovena Le  . Colitis   . Cardiogenic shock     a. Initiated on milrinone 09/2014.  . Ischemic colitis 09/2014  . CKD (chronic kidney disease), stage IV   . Transaminitis   . Mild pulmonary hypertension     a. by [place] 09/2014.  Current Outpatient Prescriptions  Medication Sig Dispense Refill  . allopurinol (ZYLOPRIM) 100 MG tablet Take 0.5 tablets (50 mg total) by mouth 2 (two) times daily.    Marland Kitchen apixaban (ELIQUIS) 5 MG TABS tablet Take 1 tablet (5 mg total) by mouth 2 (two) times daily. 180 tablet 0  . aspirin EC 81 MG tablet Take 1 tablet (81 mg total) by mouth daily.    Marland Kitchen atorvastatin (LIPITOR) 40 MG tablet Take 1 tablet (40 mg total) by mouth at bedtime. 90 tablet 0  . furosemide (LASIX) 40 MG tablet Take 1 tablet (40 mg total) by mouth every other day. May take an extra 40mg  once a day if weight is 182 pounds or greater. 30 tablet 3  . glimepiride (AMARYL) 2 MG tablet Take 1 tablet (2 mg total) by mouth daily with breakfast. 30 tablet 3  . milrinone  (PRIMACOR) 20 MG/100ML SOLN infusion Inject 20.3 mcg/min into the vein continuous. Rate: 0.25 mcg/kg/min.    . pantoprazole (PROTONIX) 40 MG tablet Take 1 tablet (40 mg total) by mouth at bedtime. 90 tablet 0  . sitaGLIPtin (JANUVIA) 25 MG tablet Take 1 tablet (25 mg total) by mouth daily. 30 tablet 0  . spironolactone (ALDACTONE) 25 MG tablet Take 25 mg by mouth every other day.     No current facility-administered medications for this encounter.    Filed Vitals:   11/04/14 1124  BP: 94/56  Pulse: 58  Weight: 178 lb 6.4 oz (80.922 kg)  SpO2: 97%    PHYSICAL EXAM:  General:  Well appearing. No resp difficulty. Daughter present  HEENT: normal Neck: supple. JVP flat. Carotids 2+ bilaterally; no bruits. No lymphadenopathy or thryomegaly appreciated. Cor: PMI normal. Regular rate & rhythm. No rubs, gallops or murmurs. Lungs: clear Abdomen: soft, nontender, nondistended. No hepatosplenomegaly. No bruits or masses. Good bowel sounds. Extremities: no cyanosis, clubbing, rash, edema RUE PICC Neuro: alert & orientedx3, cranial nerves grossly intact. Moves all 4 extremities w/o difficulty. Affect pleasant.   ASSESSMENT & PLAN: 1. Chronic combined HF: Ischemic cardiomyopathy.  [employer]. Jude ICD.  EF 20-25%, Grade 3 DD Echo 2/16.  NYHA II symptoms on milrinone 0.25 mcg/kg/min. - Check CO-OX now. If adequate can cut back to 0.125 mcg - Volume status stable. Continue lasix 40 mg every other day and continue spironolatone 25 mg every other day.  - No beta blocker with low output.    - No ACEI for now with elevated creatinine.  - Will check creatinine today, if it is trending down, would consider addition of digoxin to help wean off milrinone.  - Continue [facility] for home milrinone.  2. Recent ischemic colitis 3. CAD s/p CABG 2004; Inferior STEMI 08/2013 s/p BMS to occluded SVG-OM1-OM2, and unsuccessful attempt at [facility] on proximal 100% SVG-RCA; Inferoposterior STEMI 05/2014 s/p scoring balloon PTCA to  dSVG-LCx - Continue aspirin given recent MI => no stent placed in 4/16, just PTCA, so can stop if needed in the future given use of Eliquis.  4. CKD Stage IV: Creatinine 1.9 on recent BMET. 5. PAF: Regular rhythm. on Eliquis 5 mg twice daily. No bleeding problems.   Follow up in 2-3 weeks with Dr Haroldine Laws. Will ask VAD coordinator to meet with her to discuss LVAD. Marland Kitchen   CLEGG,AMY   Patient seen with NP, agree with the above note.  We will attempt to wean milrinone, check co-ox today and will decrease to 0.125 if able.  Will check BMET today, can use digoxin to help  wean milrinone if creatinine is trending down.   I had a discussion with the patient her daughter today about LVAD.  She is very reluctant to consider LVAD.  She understands the risks of long-term milrinone and really wants to come off the milrinone.  She is willing to talk to the LVAD coordinator, however.  I will arrange for this.  Her main goal is to get back to work.   Loralie Champagne 11/06/2014

## 2014-11-04 NOTE — Progress Notes (Signed)
Advanced Heart Failure Medication Review by a Pharmacist  Does the patient  feel that his/her medications are working for him/her?  yes  Has the patient been experiencing any side effects to the medications prescribed?  no  Does the patient measure his/her own blood pressure or blood glucose at home?  yes   Does the patient have any problems obtaining medications due to transportation or finances?   no  Understanding of regimen: good Understanding of indications: good Potential of compliance: good    Pharmacist comments:  Jo Lynn is a pleasant 69 yo F presenting with her recent hospital discharge medication list. She seems to have a good understanding of her regimen and reports good compliance. She states that she has not been able to cut her dose of spironolactone in half for a total daily dose of 12.5 mg daily so she has been taking a whole 25 mg tablet every other day. She did not have any other medication-related questions or concerns at this time.   Ruta Hinds. Velva Harman, PharmD, BCPS, CPP Clinical Pharmacist Pager: [phone] Phone: [phone] 11/04/2014 11:42 AM

## 2014-11-06 DIAGNOSIS — I5022 Chronic systolic (congestive) heart failure: Secondary | ICD-10-CM | POA: Insufficient documentation

## 2014-11-07 ENCOUNTER — Ambulatory Visit: Payer: Medicare Other | Admitting: Nurse Practitioner

## 2014-11-11 ENCOUNTER — Other Ambulatory Visit (HOSPITAL_COMMUNITY): Payer: Self-pay | Admitting: Internal Medicine

## 2014-11-14 ENCOUNTER — Telehealth: Payer: Self-pay | Admitting: Cardiology

## 2014-11-14 NOTE — Telephone Encounter (Signed)
Received completed FMLA forms ([employer]) from [employer] @ [facility] for Dr Percival Spanish to review and sign.  FMLA forms placed on [employer] for her to give to Dr Percival Spanish to review, complete and sign. lp

## 2014-11-15 ENCOUNTER — Ambulatory Visit (HOSPITAL_COMMUNITY)
Admission: RE | Admit: 2014-11-15 | Discharge: 2014-11-15 | Disposition: A | Discharge status: Home / Self Care | Payer: BLUE CROSS/BLUE SHIELD | Source: Ambulatory Visit | Attending: Cardiology | Admitting: Cardiology

## 2014-11-15 ENCOUNTER — Encounter: Payer: Self-pay | Admitting: Pharmacist

## 2014-11-15 DIAGNOSIS — I5022 Chronic systolic (congestive) heart failure: Secondary | ICD-10-CM | POA: Insufficient documentation

## 2014-11-15 LAB — CARBOXYHEMOGLOBIN
CARBOXYHEMOGLOBIN: 1 % (ref 0.5–1.5)
Methemoglobin: 0.7 % (ref 0.0–1.5)
O2 Saturation: 67.4 %
TOTAL HEMOGLOBIN: 11 g/dL — AB (ref 12.0–16.0)

## 2014-11-15 NOTE — Addendum Note (Signed)
Encounter addended by: Margret Chance, RRT on: 11/15/2014  2:29 PM<BR>     Documentation filed: Orders

## 2014-11-16 ENCOUNTER — Other Ambulatory Visit (HOSPITAL_COMMUNITY): Payer: Self-pay | Admitting: Internal Medicine

## 2014-11-18 ENCOUNTER — Telehealth (HOSPITAL_COMMUNITY): Payer: Self-pay | Admitting: *Deleted

## 2014-11-18 ENCOUNTER — Telehealth (HOSPITAL_COMMUNITY): Payer: Self-pay | Admitting: Cardiology

## 2014-11-18 DIAGNOSIS — I480 Paroxysmal atrial fibrillation: Secondary | ICD-10-CM

## 2014-11-18 MED ORDER — DIGOXIN 125 MCG PO TABS: 0.1250 mg | ORAL_TABLET | Freq: Every day | ORAL | Status: DC

## 2014-11-18 NOTE — Telephone Encounter (Signed)
Notes Recorded by Scarlette Calico, RN on 11/18/2014 at 1:05 PM Mess sent to [facility] to d/c milrinone, pt's daughter is aware, pt is sch for f/u next week 9/28

## 2014-11-18 NOTE — Telephone Encounter (Signed)
Incoming triage call Jo Lynn with [facility] called to report increased HR while she was in patient home to d/C milrinone  Hx HR 112, 109, 108    Called patient Patient reports she is feeling fine Asymptomatic denies: chest pain, sob, dizziness While on the phone patient rechecked HR- 123  above discussed with Darrick Grinder, NP Per Darrick Grinder, NP Patient should start digoxin 0.125mg , one tab daily Be sure to keep follow up scheduled  pts daughter and Jo Lynn aware

## 2014-11-18 NOTE — Telephone Encounter (Signed)
-----   Message from Larey Dresser, MD sent at 11/18/2014 10:15 AM EDT ----- May stop milrinone.  Leave PICC in.  Repeat co-ox next week to see if she can stay off milrinone.

## 2014-11-23 ENCOUNTER — Other Ambulatory Visit (HOSPITAL_COMMUNITY): Payer: Self-pay | Admitting: Internal Medicine

## 2014-11-23 ENCOUNTER — Encounter (HOSPITAL_COMMUNITY): Payer: Self-pay

## 2014-11-23 ENCOUNTER — Ambulatory Visit (HOSPITAL_COMMUNITY)
Admission: RE | Admit: 2014-11-23 | Discharge: 2014-11-23 | Disposition: A | Discharge status: Home / Self Care | Payer: BLUE CROSS/BLUE SHIELD | Source: Ambulatory Visit | Attending: Cardiology | Admitting: Cardiology

## 2014-11-23 VITALS — BP 110/62 | HR 91 | Wt 178.2 lb

## 2014-11-23 DIAGNOSIS — I480 Paroxysmal atrial fibrillation: Secondary | ICD-10-CM | POA: Insufficient documentation

## 2014-11-23 DIAGNOSIS — Z87891 Personal history of nicotine dependence: Secondary | ICD-10-CM | POA: Diagnosis not present

## 2014-11-23 DIAGNOSIS — E785 Hyperlipidemia, unspecified: Secondary | ICD-10-CM | POA: Insufficient documentation

## 2014-11-23 DIAGNOSIS — Z7982 Long term (current) use of aspirin: Secondary | ICD-10-CM | POA: Diagnosis not present

## 2014-11-23 DIAGNOSIS — Z79899 Other long term (current) drug therapy: Secondary | ICD-10-CM | POA: Insufficient documentation

## 2014-11-23 DIAGNOSIS — I129 Hypertensive chronic kidney disease with stage 1 through stage 4 chronic kidney disease, or unspecified chronic kidney disease: Secondary | ICD-10-CM | POA: Diagnosis not present

## 2014-11-23 DIAGNOSIS — Z833 Family history of diabetes mellitus: Secondary | ICD-10-CM | POA: Diagnosis not present

## 2014-11-23 DIAGNOSIS — Z951 Presence of aortocoronary bypass graft: Secondary | ICD-10-CM | POA: Diagnosis not present

## 2014-11-23 DIAGNOSIS — I255 Ischemic cardiomyopathy: Secondary | ICD-10-CM | POA: Insufficient documentation

## 2014-11-23 DIAGNOSIS — I5022 Chronic systolic (congestive) heart failure: Secondary | ICD-10-CM

## 2014-11-23 DIAGNOSIS — Z7902 Long term (current) use of antithrombotics/antiplatelets: Secondary | ICD-10-CM | POA: Diagnosis not present

## 2014-11-23 DIAGNOSIS — I272 Other secondary pulmonary hypertension: Secondary | ICD-10-CM | POA: Insufficient documentation

## 2014-11-23 DIAGNOSIS — I252 Old myocardial infarction: Secondary | ICD-10-CM | POA: Diagnosis not present

## 2014-11-23 DIAGNOSIS — N184 Chronic kidney disease, stage 4 (severe): Secondary | ICD-10-CM | POA: Diagnosis not present

## 2014-11-23 DIAGNOSIS — E1122 Type 2 diabetes mellitus with diabetic chronic kidney disease: Secondary | ICD-10-CM | POA: Diagnosis not present

## 2014-11-23 DIAGNOSIS — I2510 Atherosclerotic heart disease of native coronary artery without angina pectoris: Secondary | ICD-10-CM | POA: Diagnosis not present

## 2014-11-23 DIAGNOSIS — Z95810 Presence of automatic (implantable) cardiac defibrillator: Secondary | ICD-10-CM | POA: Diagnosis not present

## 2014-11-23 LAB — BASIC METABOLIC PANEL
Anion gap: 8 (ref 5–15)
BUN: 69 mg/dL — AB (ref 6–20)
CALCIUM: 9.5 mg/dL (ref 8.9–10.3)
CHLORIDE: 109 mmol/L (ref 101–111)
CO2: 25 mmol/L (ref 22–32)
CREATININE: 1.87 mg/dL — AB (ref 0.44–1.00)
GFR calc non Af Amer: 26 mL/min — ABNORMAL LOW (ref >60)
GFR, EST AFRICAN AMERICAN: 30 mL/min — AB (ref >60)
Glucose, Bld: 127 mg/dL — ABNORMAL HIGH (ref 65–99)
Potassium: 4.5 mmol/L (ref 3.5–5.1)
SODIUM: 142 mmol/L (ref 135–145)

## 2014-11-23 LAB — CARBOXYHEMOGLOBIN
CARBOXYHEMOGLOBIN: 1.5 % (ref 0.5–1.5)
METHEMOGLOBIN: 0.5 % (ref 0.0–1.5)
O2 SAT: 87 %
Total hemoglobin: 11.9 g/dL — ABNORMAL LOW (ref 12.0–16.0)

## 2014-11-23 LAB — MAGNESIUM: MAGNESIUM: 2.2 mg/dL (ref 1.7–2.4)

## 2014-11-23 MED ORDER — ISOSORB DINITRATE-HYDRALAZINE 20-37.5 MG PO TABS: 0.5000 | ORAL_TABLET | Freq: Three times a day (TID) | ORAL | Status: DC

## 2014-11-23 NOTE — Progress Notes (Signed)
REDS Vest Score: 35

## 2014-11-23 NOTE — Progress Notes (Signed)
Advanced Heart Failure Clinic Note   Patient ID: Jo Lynn, female   DOB: [birth year], 69 y.o.   MRN: [id number] PCP: Primary HF Cardiologist: Dr Jo Lynn Primary Cardiologist: Dr Jo Lynn   HPI: Jo Lynn is a 69 y/o woman with h/o DM2, HTN, CAD s/p CABG 2004 with inferior STEMI in 7/15, PAF, CKD, chronic systolic HF with EF ~29%, and recent ischemic colitis.  Given STEMI in 05/2014, she remains on aspirin for now.   Admitted to [facility]  09/2014 [facility] with recurrent abdominal pain, marked fatigue, cough, persistent anorexia and orthopnea. Diuresed with IV lasix and required milrinone. Unable to wean milrinone. She remained on milrinone 0.25 mcg. [facility] followed for home milrinone.  Advanced therapies are being considered.  Discharge weight was 179 pounds.   She returns for regular follow up. Milrinone was stopped on 9/23 (co-ox was 67% on 0.125). Initially felt bad for a day with fatigue and nausea but started on digoxin feels better. Can do all ADLs. Can walk around [place] without problem. No edema, orthopnea or PND.  Taking all medications. Lives at home with her daughter.  Takes lasix every other day. If weight up will take extra 40. She wants to go back to work (works in [employer]).    [place] 10/04/14 RA = 13 RV = 46/8/15 PA = 46/20 (31) PCW = 16 Fick cardiac output/index = 2.7/1.4 PVR = 5.5 WU SVR = 1540  FA sat = 98% PA sat = 36%, 37% Noninvasive BP = 82/57 (65)  ECHO 03/2014 EF 20-25%  ECHO 12/2013 25%   [place] reviewed today: Stable thoracic impedance. Vest 35.   Labs (9/16): K 4.8, creatinine 1.97, HCT 38.3 Labs (9/22): K 5.0, creatinine 1.9   ROS: All systems negative except as listed in HPI, PMH and Problem List.  SH:  Social History   Social History  . Marital Status: Single    Spouse Name: N/A  . Number of Children: 4  . Years of Education: N/A   Occupational History  . Not on file.   Social History Main Topics  . Smoking status: Former  Smoker    Types: Cigarettes  . Smokeless tobacco: Never Used     Comment: quit many years ago  . Alcohol Use: No  . Drug Use: No  . Sexual Activity: Not on file   Other Topics Concern  . Not on file   Social History Narrative    FH:  Family History  Problem Relation Age of Onset  . Diabetes Mother   . Diabetes Father   . Heart attack Neg Hx   . Stroke Neg Hx     Past Medical History  Diagnosis Date  . Myocardial infarction 10/2002    a. 2004 - MV CAD --> Referred for CABG.  . 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 s/p CABGx4 in 2004 with LIMA-LAD, SVG-RPDA, SVG-OM1-OM2. b. Inferior STEMI 08/2013 s/p BMS to occluded SVG-OM1-OM2, and Unsuccessful attempt at [facility] on proximal 100% SVG-RCA. c. Inferoposterior STEMI 05/2014 s/p Scoring balloon PTCA to dSVG-Cx.  . 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 Lucianne Lei Jo Lynn: LIMA-LAD, SVG-RPDA, SVG-OM1-OM2  . Cardiomyopathy, ischemic   . Combined systolic and diastolic congestive heart failure, NYHA class 3   . Essential hypertension   . Hyperlipidemia with target LDL less than 70   . Diabetes mellitus type 2 with complications   . 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 Lovena Lynn  .  Colitis   . Cardiogenic shock     a. Initiated on milrinone 09/2014.  . Ischemic colitis 09/2014  . CKD (chronic kidney disease), stage IV   . Transaminitis   . Mild pulmonary hypertension     a. by [place] 09/2014.    Current Outpatient Prescriptions  Medication Sig Dispense Refill  . allopurinol (ZYLOPRIM) 100 MG tablet Take 0.5 tablets (50 mg total) by mouth 2 (two) times daily.    Marland Kitchen apixaban (ELIQUIS) 5 MG TABS tablet Take 1 tablet (5 mg total) by mouth 2 (two) times daily. 180 tablet 0  . aspirin EC 81 MG tablet Take 1 tablet (81 mg total) by mouth daily.    Marland Kitchen atorvastatin (LIPITOR) 40 MG tablet Take 1 tablet (40 mg total) by mouth at bedtime. 90 tablet 0  . digoxin (LANOXIN) 0.125 MG tablet Take 1 tablet (0.125 mg total) by  mouth daily. 90 tablet 3  . furosemide (LASIX) 40 MG tablet Take 1 tablet (40 mg total) by mouth every other day. May take an extra 40mg  once a day if weight is 182 pounds or greater. 30 tablet 3  . glimepiride (AMARYL) 2 MG tablet Take 1 tablet (2 mg total) by mouth daily with breakfast. 30 tablet 3  . pantoprazole (PROTONIX) 40 MG tablet Take 1 tablet (40 mg total) by mouth at bedtime. 90 tablet 0  . sitaGLIPtin (JANUVIA) 25 MG tablet Take 1 tablet (25 mg total) by mouth daily. 30 tablet 0  . spironolactone (ALDACTONE) 25 MG tablet Take 25 mg by mouth every other day.    Marland Kitchen KLOR-CON M20 20 MEQ tablet Take 20 mEq by mouth daily.     No current facility-administered medications for this encounter.    Filed Vitals:   11/23/14 1455  BP: 110/62  Pulse: 91  Weight: 178 lb 4 oz (80.854 kg)  SpO2: 100%    PHYSICAL EXAM:  General:  Elderly woman. No resp difficulty. Daughter present  HEENT: normal Neck: supple. JVP  6. Carotids 2+ bilaterally; no bruits. No lymphadenopathy or thryomegaly appreciated. Cor: PMI normal. Regular rate & rhythm. No rubs, gallops or murmurs. Lungs: clear Abdomen: soft, nontender, nondistended. No hepatosplenomegaly. No bruits or masses. Good bowel sounds. Extremities: no cyanosis, clubbing, rash, edema RUE PICC Neuro: alert & orientedx3, cranial nerves grossly intact. Moves all 4 extremities w/o difficulty. Affect pleasant.   ASSESSMENT & PLAN: 1. Chronic combined HF: Ischemic cardiomyopathy.  [employer]. [employer] ICD.  EF 20-25%, Grade 3 DD Echo 2/16.  NYHA II symptoms. Milrinone d/c'd 11/18/14 - Check CO-OX now. If adequate can cut back to 0.125 mcg - Volume status stable. Continue lasix 40 mg every other day and continue spironolatone 25 mg every other day.  - No beta blocker with low output.    - No ACEI for now with elevated creatinine.  - Continue dig 0.125 mg 2. Recent ischemic colitis due to low output HF 3. CAD s/p CABG 2004; Inferior STEMI 08/2013 s/p BMS to  occluded SVG-OM1-OM2, and unsuccessful attempt at [facility] on proximal 100% SVG-RCA; Inferoposterior STEMI 05/2014 s/p scoring balloon PTCA to dSVG-LCx - Continue aspirin given recent MI => no stent placed in 4/16, just PTCA, so can stop if needed in the future given use of Eliquis.  4. CKD Stage IV: Creatinine 1.9 on recent BMET. 5. PAF: Regular rhythm. on Eliquis 5 mg twice daily. No bleeding problems.   Legrand Como "Jonni Sanger" [place], PA-C 11/23/2014   Patient seen and examined with Oda Kilts, PA-C. We  discussed all aspects of the encounter. I agree with the assessment and plan as stated above.    She remains stable off of milrinone NYHA III. I worry she is still a bit tenuous. Volume status ok. Will start Bidil 1/2 tablet bid. (No ACE/ARB due to CKD and hyperkalemia). Continue to hold b-blocker with recent low output. Will check BMET and co-ox today. If co-ox ok can pull PICC. She continue to want to go back to work but I do not think she is ready yet as her HF remains tenuous. I told her that we would continue to re-evaluate this every appointment and get her back to work when we feel it is safe. Continue Eliquis for PAF.   Bensimhon, Daniel,MD 4:07 PM

## 2014-11-23 NOTE — Patient Instructions (Signed)
Start Bidil 1/2 tab Three times a day   Your physician recommends that you schedule a follow-up appointment in: 3-4 weeks with Dr Haroldine Laws

## 2014-11-24 ENCOUNTER — Encounter (HOSPITAL_COMMUNITY): Payer: Self-pay | Admitting: *Deleted

## 2014-11-24 NOTE — Progress Notes (Signed)
Received record request from [employer] along with signed auth from pt, for pt's short term disability, records faxed back to them at [phone]

## 2014-11-28 ENCOUNTER — Other Ambulatory Visit (HOSPITAL_COMMUNITY): Payer: Self-pay | Admitting: *Deleted

## 2014-11-28 ENCOUNTER — Telehealth (HOSPITAL_COMMUNITY): Payer: Self-pay | Admitting: *Deleted

## 2014-11-28 NOTE — Telephone Encounter (Signed)
[place] RN called to ask about PICC coming out per pt said that doctor said her picc would come out, as long as coox was ok  Coox was ok so picc was pulled.

## 2014-11-29 ENCOUNTER — Telehealth: Payer: Self-pay | Admitting: Cardiology

## 2014-11-29 ENCOUNTER — Encounter: Payer: Self-pay | Admitting: Nurse Practitioner

## 2014-11-29 ENCOUNTER — Ambulatory Visit (INDEPENDENT_AMBULATORY_CARE_PROVIDER_SITE_OTHER): Payer: BLUE CROSS/BLUE SHIELD | Admitting: Nurse Practitioner

## 2014-11-29 ENCOUNTER — Encounter: Payer: Self-pay | Admitting: Internal Medicine

## 2014-11-29 ENCOUNTER — Encounter (INDEPENDENT_AMBULATORY_CARE_PROVIDER_SITE_OTHER): Payer: Self-pay

## 2014-11-29 VITALS — BP 117/67 | HR 79 | Temp 97.0°F | Ht 67.0 in | Wt 177.0 lb

## 2014-11-29 DIAGNOSIS — E876 Hypokalemia: Secondary | ICD-10-CM | POA: Diagnosis not present

## 2014-11-29 DIAGNOSIS — Z23 Encounter for immunization: Secondary | ICD-10-CM | POA: Diagnosis not present

## 2014-11-29 DIAGNOSIS — E1165 Type 2 diabetes mellitus with hyperglycemia: Secondary | ICD-10-CM

## 2014-11-29 DIAGNOSIS — E1122 Type 2 diabetes mellitus with diabetic chronic kidney disease: Secondary | ICD-10-CM

## 2014-11-29 DIAGNOSIS — E785 Hyperlipidemia, unspecified: Secondary | ICD-10-CM

## 2014-11-29 DIAGNOSIS — IMO0002 Reserved for concepts with insufficient information to code with codable children: Secondary | ICD-10-CM

## 2014-11-29 DIAGNOSIS — N183 Chronic kidney disease, stage 3 (moderate): Secondary | ICD-10-CM | POA: Diagnosis not present

## 2014-11-29 DIAGNOSIS — I10 Essential (primary) hypertension: Secondary | ICD-10-CM | POA: Diagnosis not present

## 2014-11-29 DIAGNOSIS — I480 Paroxysmal atrial fibrillation: Secondary | ICD-10-CM | POA: Diagnosis not present

## 2014-11-29 DIAGNOSIS — I2119 ST elevation (STEMI) myocardial infarction involving other coronary artery of inferior wall: Secondary | ICD-10-CM | POA: Diagnosis not present

## 2014-11-29 DIAGNOSIS — K219 Gastro-esophageal reflux disease without esophagitis: Secondary | ICD-10-CM

## 2014-11-29 DIAGNOSIS — I5022 Chronic systolic (congestive) heart failure: Secondary | ICD-10-CM | POA: Diagnosis not present

## 2014-11-29 LAB — POCT GLYCOSYLATED HEMOGLOBIN (HGB A1C): Hemoglobin A1C: 8.4

## 2014-11-29 MED ORDER — APIXABAN 5 MG PO TABS: 5.0000 mg | ORAL_TABLET | Freq: Two times a day (BID) | ORAL | Status: DC

## 2014-11-29 MED ORDER — ATORVASTATIN CALCIUM 40 MG PO TABS: 40.0000 mg | ORAL_TABLET | Freq: Every day | ORAL | Status: DC

## 2014-11-29 MED ORDER — KLOR-CON M20 20 MEQ PO TBCR: 20.0000 meq | EXTENDED_RELEASE_TABLET | Freq: Every day | ORAL | Status: DC

## 2014-11-29 MED ORDER — SPIRONOLACTONE 25 MG PO TABS: 25.0000 mg | ORAL_TABLET | ORAL | Status: DC

## 2014-11-29 MED ORDER — SITAGLIPTIN PHOSPHATE 25 MG PO TABS: 25.0000 mg | ORAL_TABLET | Freq: Every day | ORAL | Status: DC

## 2014-11-29 MED ORDER — PANTOPRAZOLE SODIUM 40 MG PO TBEC: 40.0000 mg | DELAYED_RELEASE_TABLET | Freq: Every day | ORAL | Status: DC

## 2014-11-29 NOTE — Telephone Encounter (Signed)
Spoke with patient's daughter. Patient's insurance is not going to pay for eliquis without a prior authorization. Patient has 1 pill left and daughter says "something needs to be done today". They live in [place] so getting them samples would be difficult. [facility] to inquire about prior authorization.   PA phone # [phone] ([facility])  PCP refilled Rx 11/29/14 >> PA request may have gone to PCP office vs cardiology office.   PA for eliquis called in. Approved until 11/29/15 Approval ID [id number]  Patient's daughter notified and she is aware she needs to call pharmacy to get med refilled.

## 2014-11-29 NOTE — Patient Instructions (Signed)
Fat and Cholesterol Control Diet Fat and cholesterol levels in your blood and organs are influenced by your diet. High levels of fat and cholesterol may lead to diseases of the heart, small and large blood vessels, gallbladder, liver, and pancreas. CONTROLLING FAT AND CHOLESTEROL WITH DIET Although exercise and lifestyle factors are important, your diet is key. That is because certain foods are known to raise cholesterol and others to lower it. The goal is to balance foods for their effect on cholesterol and more importantly, to replace saturated and trans fat with other types of fat, such as monounsaturated fat, polyunsaturated fat, and omega-3 fatty acids. On average, a person should consume no more than 15 to 17 g of saturated fat daily. Saturated and trans fats are considered "bad" fats, and they will raise LDL cholesterol. Saturated fats are primarily found in animal products such as meats, butter, and cream. However, that does not mean you need to give up all your favorite foods. Today, there are good tasting, low-fat, low-cholesterol substitutes for most of the things you like to eat. Choose low-fat or nonfat alternatives. Choose round or loin cuts of red meat. These types of cuts are lowest in fat and cholesterol. Chicken (without the skin), fish, veal, and ground turkey breast are great choices. Eliminate fatty meats, such as hot dogs and salami. Even shellfish have little or no saturated fat. Have a 3 oz (85 g) portion when you eat lean meat, poultry, or fish. Trans fats are also called "partially hydrogenated oils." They are oils that have been scientifically manipulated so that they are solid at room temperature resulting in a longer shelf life and improved taste and texture of foods in which they are added. Trans fats are found in stick margarine, some tub margarines, cookies, crackers, and baked goods.  When baking and cooking, oils are a great substitute for butter. The monounsaturated oils are  especially beneficial since it is believed they lower LDL and raise HDL. The oils you should avoid entirely are saturated tropical oils, such as coconut and palm.  Remember to eat a lot from food groups that are naturally free of saturated and trans fat, including fish, fruit, vegetables, beans, grains (barley, rice, couscous, bulgur wheat), and pasta (without cream sauces).  IDENTIFYING FOODS THAT LOWER FAT AND CHOLESTEROL  Soluble fiber may lower your cholesterol. This type of fiber is found in fruits such as apples, vegetables such as broccoli, potatoes, and carrots, legumes such as beans, peas, and lentils, and grains such as barley. Foods fortified with plant sterols (phytosterol) may also lower cholesterol. You should eat at least 2 g per day of these foods for a cholesterol lowering effect.  Read package labels to identify low-saturated fats, trans fat free, and low-fat foods at the supermarket. Select cheeses that have only 2 to 3 g saturated fat per ounce. Use a heart-healthy tub margarine that is free of trans fats or partially hydrogenated oil. When buying baked goods (cookies, crackers), avoid partially hydrogenated oils. Breads and muffins should be made from whole grains (whole-wheat or whole oat flour, instead of "flour" or "enriched flour"). Buy non-creamy canned soups with reduced salt and no added fats.  FOOD PREPARATION TECHNIQUES  Never deep-fry. If you must fry, either stir-fry, which uses very little fat, or use non-stick cooking sprays. When possible, broil, bake, or roast meats, and steam vegetables. Instead of putting butter or margarine on vegetables, use lemon and herbs, applesauce, and cinnamon (for squash and sweet potatoes). Use nonfat   yogurt, salsa, and low-fat dressings for salads.  LOW-SATURATED FAT / LOW-FAT FOOD SUBSTITUTES Meats / Saturated Fat (g)  Avoid: Steak, marbled (3 oz/85 g) / 11 g  Choose: Steak, lean (3 oz/85 g) / 4 g  Avoid: Hamburger (3 oz/85 g) / 7  g  Choose: Hamburger, lean (3 oz/85 g) / 5 g  Avoid: Ham (3 oz/85 g) / 6 g  Choose: Ham, lean cut (3 oz/85 g) / 2.4 g  Avoid: Chicken, with skin, dark meat (3 oz/85 g) / 4 g  Choose: Chicken, skin removed, dark meat (3 oz/85 g) / 2 g  Avoid: Chicken, with skin, light meat (3 oz/85 g) / 2.5 g  Choose: Chicken, skin removed, light meat (3 oz/85 g) / 1 g Dairy / Saturated Fat (g)  Avoid: Whole milk (1 cup) / 5 g  Choose: Low-fat milk, 2% (1 cup) / 3 g  Choose: Low-fat milk, 1% (1 cup) / 1.5 g  Choose: Skim milk (1 cup) / 0.3 g  Avoid: Hard cheese (1 oz/28 g) / 6 g  Choose: Skim milk cheese (1 oz/28 g) / 2 to 3 g  Avoid: Cottage cheese, 4% fat (1 cup) / 6.5 g  Choose: Low-fat cottage cheese, 1% fat (1 cup) / 1.5 g  Avoid: Ice cream (1 cup) / 9 g  Choose: Sherbet (1 cup) / 2.5 g  Choose: Nonfat frozen yogurt (1 cup) / 0.3 g  Choose: Frozen fruit bar / trace  Avoid: Whipped cream (1 tbs) / 3.5 g  Choose: Nondairy whipped topping (1 tbs) / 1 g Condiments / Saturated Fat (g)  Avoid: Mayonnaise (1 tbs) / 2 g  Choose: Low-fat mayonnaise (1 tbs) / 1 g  Avoid: Butter (1 tbs) / 7 g  Choose: Extra light margarine (1 tbs) / 1 g  Avoid: Coconut oil (1 tbs) / 11.8 g  Choose: Olive oil (1 tbs) / 1.8 g  Choose: Corn oil (1 tbs) / 1.7 g  Choose: Safflower oil (1 tbs) / 1.2 g  Choose: Sunflower oil (1 tbs) / 1.4 g  Choose: Soybean oil (1 tbs) / 2.4 g  Choose: Canola oil (1 tbs) / 1 g Document Released: 02/11/2005 Document Revised: 06/08/2012 Document Reviewed: 05/12/2013 ExitCare Patient Information 2015 [employer]. This information is not intended to replace advice given to you by your health care provider. Make sure you discuss any questions you have with your health care provider.  

## 2014-11-29 NOTE — Progress Notes (Signed)
Subjective:    Patient ID: Jo Lynn, female    DOB: 03-Nov-1945, 69 y.o.   MRN: [id number]  Patient here today for follow up of chronic medical problems. She has been in and out of the hospital a lot this year mainly with her heart. She has been taken out of work. SHe sees her cardiologist every 3 months and sooner when needed. Today she say she feels ok- she is just very week   Hypertension This is a chronic problem. The problem is unchanged. The problem is controlled. Pertinent negatives include no chest pain, headaches, palpitations or shortness of breath. Risk factors for coronary artery disease include stress, dyslipidemia and post-menopausal state. Past treatments include angiotensin blockers, diuretics and beta blockers. The current treatment provides significant improvement. There are no compliance problems.  Hypertensive end-organ damage includes CAD/MI.  Hyperlipidemia This is a chronic problem. The current episode started more than 1 year ago. The problem is uncontrolled. Recent lipid tests were reviewed and are high. Exacerbating diseases include diabetes. She has no history of hypothyroidism or obesity. Pertinent negatives include no chest pain or shortness of breath. Current antihyperlipidemic treatment includes statins. The current treatment provides moderate improvement of lipids. There are no compliance problems.  Risk factors for coronary artery disease include dyslipidemia, family history, hypertension and post-menopausal.  Diabetes She presents for her follow-up diabetic visit. She has type 2 diabetes mellitus. No MedicAlert identification noted. Her disease course has been improving. Pertinent negatives for hypoglycemia include no headaches. Pertinent negatives for diabetes include no chest pain, no polydipsia, no polyphagia, no polyuria, no visual change and no weakness. Symptoms are improving. Diabetic complications include heart disease. Pertinent negatives for diabetic  complications include no peripheral neuropathy. Risk factors for coronary artery disease include stress, obesity, hypertension, dyslipidemia and family history. When asked about current treatments, none were reported. She is compliant with treatment most of the time. Her weight is stable. When asked about meal planning, she reported none. She has not had a previous visit with a dietitian. She rarely participates in exercise. Her breakfast blood glucose is taken between 9-10 am. Her breakfast blood glucose range is generally 180-200 mg/dl. Her overall blood glucose range is 180-200 mg/dl. An ACE inhibitor/angiotensin II receptor blocker is being taken. She does not see a podiatrist.Eye exam is not current.  CAD /CHF Cardiologist increased her lopressor- she is doing well but they want to put a defibrillator but [patient told them no.  Patient encouraged to get done. Atrial fib Currently on eliquis- no c/o bleeding- no palpitations     Review of Systems  Respiratory: Negative for shortness of breath.   Cardiovascular: Negative for chest pain and palpitations.  Endocrine: Negative for polydipsia, polyphagia and polyuria.  Neurological: Negative for weakness and headaches.  All other systems reviewed and are negative.      Objective:   Physical Exam  Constitutional: She is oriented to person, place, and time. She appears well-developed and well-nourished.  HENT:  Nose: Nose normal.  Mouth/Throat: Oropharynx is clear and moist.  Eyes: EOM are normal.  Neck: Trachea normal, normal range of motion and full passive range of motion without pain. Neck supple. No JVD present. Carotid bruit is not present. No thyromegaly present.  Cardiovascular: Normal rate, regular rhythm, normal heart sounds and intact distal pulses.  Exam reveals no gallop and no friction rub.   No murmur heard. Pulmonary/Chest: Effort normal and breath sounds normal.  Abdominal: Soft. Bowel sounds are normal. She exhibits no  distension and no mass. There is no tenderness.  Musculoskeletal: Normal range of motion.  Lymphadenopathy:    She has no cervical adenopathy.  Neurological: She is alert and oriented to person, place, and time. She has normal reflexes.  Skin: Skin is warm and dry.  Psychiatric: She has a normal mood and affect. Her behavior is normal. Judgment and thought content normal.   BP 117/67 mmHg  Pulse 79  Temp(Src) 97 F (36.1 C) (Oral)  Ht 5\' 7"  (1.702 m)  Wt 177 lb (80.287 kg)  BMI 27.72 kg/m2         Assessment & Plan:  1. Essential hypertension Do not add salt to diet - apixaban (ELIQUIS) 5 MG TABS tablet; Take 1 tablet (5 mg total) by mouth 2 (two) times daily.  Dispense: 180 tablet; Refill: 0  2. ST elevation myocardial infarction (STEMI) of inferolateral wall (HCC) Keep follow up appointment with cardiology  3. Uncontrolled type 2 diabetes mellitus with stage 3 chronic kidney disease, without long-term current use of insulin (HCC) Continue to watch carbs - sitaGLIPtin (JANUVIA) 25 MG tablet; Take 1 tablet (25 mg total) by mouth daily.  Dispense: 30 tablet; Refill: 0- POCT glycosylated hemoglobin (Hb A1C)   4. Hyperlipidemia with target LDL less than 70 Low fat diet - atorvastatin (LIPITOR) 40 MG tablet; Take 1 tablet (40 mg total) by mouth at bedtime.  Dispense: 90 tablet; Refill: 0  5. Paroxysmal atrial fibrillation (HCC)   6. Chronic systolic CHF (congestive heart failure) (HCC) To office if develops SOB - spironolactone (ALDACTONE) 25 MG tablet; Take 1 tablet (25 mg total) by mouth every other day.  Dispense: 30 tablet; Refill: 5  7. Gastroesophageal reflux disease without esophagitis Avoid spicy foods Do not eat 2 hours prior to bedtime - pantoprazole (PROTONIX) 40 MG tablet; Take 1 tablet (40 mg total) by mouth at bedtime.  Dispense: 90 tablet; Refill: 0  8. Hypokalemia - KLOR-CON M20 20 MEQ tablet; Take 1 tablet (20 mEq total) by mouth daily.  Dispense: 30  tablet; Refill: 5  Continue all meds Only labs drawn was [id number] Follow up in 3 months  [place], FNP

## 2014-11-29 NOTE — Telephone Encounter (Signed)
Pt is having a hard time getting her Eliquis.Please call,her daughter will tell you what is going on.

## 2014-11-30 ENCOUNTER — Telehealth: Payer: Self-pay

## 2014-12-01 ENCOUNTER — Other Ambulatory Visit (HOSPITAL_COMMUNITY): Payer: Self-pay | Admitting: Internal Medicine

## 2014-12-06 ENCOUNTER — Telehealth (HOSPITAL_COMMUNITY): Payer: Self-pay

## 2014-12-06 NOTE — Telephone Encounter (Signed)
[facility] called wanting to extend home care for 6 weeks

## 2014-12-06 NOTE — Telephone Encounter (Signed)
x

## 2014-12-22 ENCOUNTER — Encounter (HOSPITAL_COMMUNITY): Payer: Self-pay | Admitting: Internal Medicine

## 2014-12-22 ENCOUNTER — Encounter (HOSPITAL_COMMUNITY): Payer: Self-pay | Admitting: *Deleted

## 2014-12-22 ENCOUNTER — Ambulatory Visit (HOSPITAL_COMMUNITY)
Admission: RE | Admit: 2014-12-22 | Discharge: 2014-12-22 | Disposition: A | Discharge status: Home / Self Care | Payer: BLUE CROSS/BLUE SHIELD | Source: Ambulatory Visit | Attending: Internal Medicine | Admitting: Internal Medicine

## 2014-12-22 VITALS — BP 112/60 | HR 81 | Wt 181.5 lb

## 2014-12-22 DIAGNOSIS — I255 Ischemic cardiomyopathy: Secondary | ICD-10-CM | POA: Insufficient documentation

## 2014-12-22 DIAGNOSIS — E785 Hyperlipidemia, unspecified: Secondary | ICD-10-CM | POA: Diagnosis not present

## 2014-12-22 DIAGNOSIS — Z87891 Personal history of nicotine dependence: Secondary | ICD-10-CM | POA: Diagnosis not present

## 2014-12-22 DIAGNOSIS — Z79899 Other long term (current) drug therapy: Secondary | ICD-10-CM | POA: Insufficient documentation

## 2014-12-22 DIAGNOSIS — Z951 Presence of aortocoronary bypass graft: Secondary | ICD-10-CM | POA: Diagnosis not present

## 2014-12-22 DIAGNOSIS — N184 Chronic kidney disease, stage 4 (severe): Secondary | ICD-10-CM | POA: Diagnosis not present

## 2014-12-22 DIAGNOSIS — I480 Paroxysmal atrial fibrillation: Secondary | ICD-10-CM | POA: Insufficient documentation

## 2014-12-22 DIAGNOSIS — I5042 Chronic combined systolic (congestive) and diastolic (congestive) heart failure: Secondary | ICD-10-CM | POA: Diagnosis present

## 2014-12-22 DIAGNOSIS — Z95810 Presence of automatic (implantable) cardiac defibrillator: Secondary | ICD-10-CM | POA: Insufficient documentation

## 2014-12-22 DIAGNOSIS — I5022 Chronic systolic (congestive) heart failure: Secondary | ICD-10-CM | POA: Diagnosis not present

## 2014-12-22 DIAGNOSIS — I272 Other secondary pulmonary hypertension: Secondary | ICD-10-CM | POA: Insufficient documentation

## 2014-12-22 DIAGNOSIS — I130 Hypertensive heart and chronic kidney disease with heart failure and stage 1 through stage 4 chronic kidney disease, or unspecified chronic kidney disease: Secondary | ICD-10-CM | POA: Insufficient documentation

## 2014-12-22 DIAGNOSIS — Z833 Family history of diabetes mellitus: Secondary | ICD-10-CM | POA: Diagnosis not present

## 2014-12-22 DIAGNOSIS — Z9861 Coronary angioplasty status: Secondary | ICD-10-CM | POA: Insufficient documentation

## 2014-12-22 DIAGNOSIS — Z7984 Long term (current) use of oral hypoglycemic drugs: Secondary | ICD-10-CM | POA: Diagnosis not present

## 2014-12-22 DIAGNOSIS — I2510 Atherosclerotic heart disease of native coronary artery without angina pectoris: Secondary | ICD-10-CM | POA: Insufficient documentation

## 2014-12-22 DIAGNOSIS — E1122 Type 2 diabetes mellitus with diabetic chronic kidney disease: Secondary | ICD-10-CM | POA: Insufficient documentation

## 2014-12-22 DIAGNOSIS — Z7982 Long term (current) use of aspirin: Secondary | ICD-10-CM | POA: Diagnosis not present

## 2014-12-22 DIAGNOSIS — Z7902 Long term (current) use of antithrombotics/antiplatelets: Secondary | ICD-10-CM | POA: Insufficient documentation

## 2014-12-22 DIAGNOSIS — I252 Old myocardial infarction: Secondary | ICD-10-CM | POA: Insufficient documentation

## 2014-12-22 NOTE — Patient Instructions (Signed)
Your physician recommends that you schedule a follow-up appointment in: 1 month  

## 2014-12-22 NOTE — Progress Notes (Signed)
Advanced Heart Failure Clinic Note   Patient ID: Jo Lynn, female   DOB: 12/03/1945, 69 y.o.   MRN: [id number] PCP: Primary HF Cardiologist: Dr Jo Lynn Primary Cardiologist: Dr Jo Lynn   HPI: Ms Jo Lynn is a 69 y/o woman with h/o DM2, HTN, CAD s/p CABG 2004 with inferior STEMI in 7/15, PAF, CKD, chronic systolic HF with EF ~24%, and recent ischemic colitis.  Given STEMI in 05/2014, she remains on aspirin for now.   Admitted to [facility]  09/2014 [facility] with recurrent abdominal pain, marked fatigue, cough, persistent anorexia and orthopnea. Diuresed with IV lasix and required milrinone. Unable to wean milrinone. She remained on milrinone 0.25 mcg. [facility] followed for home milrinone.  Advanced therapies are being considered.  Discharge weight was 179 pounds.   She returns for regular follow up. Milrinone was stopped on 9/23 (co-ox was 67% on 0.125). Initially felt bad for a day with fatigue and nausea but started on digoxin feels better. At last visit we started Bidil 1/2 tab tid. Tolerating well. Mild HA. Feels pretty good. Can do all ADLs. Can walk around [place] without problem. No edema, orthopnea or PND.  Taking all medications. Lives at home with her daughter. Appetite improved and gained about 3-4 pounds. Takes lasix every other day. Has not taken extra. She wants to go back to work (works in [employer]).    [place] 10/04/14 RA = 13 RV = 46/8/15 PA = 46/20 (31) PCW = 16 Fick cardiac output/index = 2.7/1.4 PVR = 5.5 WU SVR = 1540  FA sat = 98% PA sat = 36%, 37% Noninvasive BP = 82/57 (65)  ECHO 03/2014 EF 20-25%  ECHO 12/2013 25%   Corevue reviewed today: Stable thoracic impedance. Vest 35.   Labs (9/16): K 4.8, creatinine 1.97, HCT 38.3 Labs (9/22): K 5.0, creatinine 1.9   ROS: All systems negative except as listed in HPI, PMH and Problem List.  SH:  Social History   Social History  . Marital Status: Single    Spouse Name: N/A  . Number of Children: 4  .  Years of Education: N/A   Occupational History  . Not on file.   Social History Main Topics  . Smoking status: Former Smoker    Types: Cigarettes  . Smokeless tobacco: Never Used     Comment: quit many years ago  . Alcohol Use: No  . Drug Use: No  . Sexual Activity: Not on file   Other Topics Concern  . Not on file   Social History Narrative    FH:  Family History  Problem Relation Age of Onset  . Diabetes Mother   . Diabetes Father   . Heart attack Neg Hx   . Stroke Neg Hx     Past Medical History  Diagnosis Date  . Myocardial infarction ([place]) 10/2002    a. 2004 - MV CAD --> Referred for CABG.  . 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 s/p CABGx4 in 2004 with LIMA-LAD, SVG-RPDA, SVG-OM1-OM2. b. Inferior STEMI 08/2013 s/p BMS to occluded SVG-OM1-OM2, and Unsuccessful attempt at [facility] on proximal 100% SVG-RCA. c. Inferoposterior STEMI 05/2014 s/p Scoring balloon PTCA to dSVG-Cx.  . 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 Lucianne Lei Lynn: LIMA-LAD, SVG-RPDA, SVG-OM1-OM2  . Cardiomyopathy, ischemic   . Combined systolic and diastolic congestive heart failure, NYHA class 3 ([place])   . Essential hypertension   . Hyperlipidemia with target LDL less than 70   . Diabetes mellitus type  2 with complications ([place])   . 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 Lovena Lynn  . Colitis   . Cardiogenic shock ([place])     a. Initiated on milrinone 09/2014.  . Ischemic colitis ([place]) 09/2014  . CKD (chronic kidney disease), stage IV ([place])   . Transaminitis   . Mild pulmonary hypertension ([place])     a. by [place] 09/2014.    Current Outpatient Prescriptions  Medication Sig Dispense Refill  . allopurinol (ZYLOPRIM) 100 MG tablet Take 0.5 tablets (50 mg total) by mouth 2 (two) times daily.    Marland Kitchen apixaban (ELIQUIS) 5 MG TABS tablet Take 1 tablet (5 mg total) by mouth 2 (two) times daily. 180 tablet 0  . aspirin EC 81 MG tablet Take 1 tablet (81 mg total) by mouth daily.    Marland Kitchen  atorvastatin (LIPITOR) 40 MG tablet Take 1 tablet (40 mg total) by mouth at bedtime. 90 tablet 0  . digoxin (LANOXIN) 0.125 MG tablet Take 1 tablet (0.125 mg total) by mouth daily. 90 tablet 3  . furosemide (LASIX) 40 MG tablet Take 1 tablet (40 mg total) by mouth every other day. May take an extra 40mg  once a day if weight is 182 pounds or greater. 30 tablet 3  . glimepiride (AMARYL) 2 MG tablet Take 1 tablet (2 mg total) by mouth daily with breakfast. 30 tablet 3  . isosorbide-hydrALAZINE (BIDIL) 20-37.5 MG tablet Take 0.5 tablets by mouth 3 (three) times daily. 45 tablet 6  . KLOR-CON M20 20 MEQ tablet Take 1 tablet (20 mEq total) by mouth daily. 30 tablet 5  . pantoprazole (PROTONIX) 40 MG tablet Take 1 tablet (40 mg total) by mouth at bedtime. 90 tablet 0  . sitaGLIPtin (JANUVIA) 25 MG tablet Take 1 tablet (25 mg total) by mouth daily. 30 tablet 0  . spironolactone (ALDACTONE) 25 MG tablet Take 1 tablet (25 mg total) by mouth every other day. 30 tablet 5   No current facility-administered medications for this encounter.    Filed Vitals:   12/22/14 1450  BP: 112/60  Pulse: 81  Weight: 181 lb 8 oz (82.328 kg)  SpO2: 100%    PHYSICAL EXAM:  General:  Elderly woman. No resp difficulty. Daughter present  HEENT: normal Neck: supple. JVP  7-8. Carotids 2+ bilaterally; no bruits. No lymphadenopathy or thryomegaly appreciated. Cor: PMI normal. Regular rate & rhythm. No rubs, gallops or murmurs. Lungs: clear Abdomen: soft, nontender, nondistended. No hepatosplenomegaly. No bruits or masses. Good bowel sounds. Extremities: no cyanosis, clubbing, rash, tr edema  Neuro: alert & orientedx3, cranial nerves grossly intact. Moves all 4 extremities w/o difficulty. Affect pleasant.   ASSESSMENT & PLAN: 1. Chronic combined HF: Ischemic cardiomyopathy.  St. Jude ICD.  EF 20-25%, Grade 3 DD Echo 2/16.  NYHA II symptoms. Milrinone d/c'd 11/18/14 - Volume status slightly elevated. Continue lasix 40 mg  every other day (will take extra dose today) and continue spironolatone 25 mg every other day.  - No beta blocker with low output.    - No ACEI for now with elevated creatinine.  - Increase Bidil to 1 tab tid can take with tylenol as needed. At next visit increase Bidil. (can have Pharmacy visit) - Continue dig 0.125 mg - Can consider Corlanor as needed.  2. H/o ischemic colitis due to low output HF 3. CAD s/p CABG 2004; Inferior STEMI 08/2013 s/p BMS to occluded SVG-OM1-OM2, and unsuccessful attempt at [facility] on proximal 100% SVG-RCA;  Inferoposterior STEMI 05/2014 s/p scoring balloon PTCA to dSVG-LCx - Continue aspirin given recent MI => no stent placed in 4/16, just PTCA, so can stop if needed in the future given use of Eliquis.  4. CKD Stage IV: Creatinine 1.9 on recent BMET. 5. PAF: Regular rhythm. on Eliquis 5 mg twice daily. No bleeding problems.   Tamico Mundo,MD 3:04 PM

## 2014-12-22 NOTE — Addendum Note (Signed)
Encounter addended by: Scarlette Calico, RN on: 12/22/2014  4:07 PM<BR>     Documentation filed: Patient Instructions Section

## 2015-01-09 ENCOUNTER — Encounter: Payer: Self-pay | Admitting: Internal Medicine

## 2015-01-10 ENCOUNTER — Ambulatory Visit (INDEPENDENT_AMBULATORY_CARE_PROVIDER_SITE_OTHER): Payer: BLUE CROSS/BLUE SHIELD | Admitting: Internal Medicine

## 2015-01-10 ENCOUNTER — Encounter: Payer: Self-pay | Admitting: Internal Medicine

## 2015-01-10 VITALS — BP 102/50 | HR 92 | Ht 67.0 in | Wt 183.0 lb

## 2015-01-10 DIAGNOSIS — I5022 Chronic systolic (congestive) heart failure: Secondary | ICD-10-CM | POA: Diagnosis not present

## 2015-01-10 DIAGNOSIS — I255 Ischemic cardiomyopathy: Secondary | ICD-10-CM

## 2015-01-10 DIAGNOSIS — Z95810 Presence of automatic (implantable) cardiac defibrillator: Secondary | ICD-10-CM | POA: Diagnosis not present

## 2015-01-10 DIAGNOSIS — I480 Paroxysmal atrial fibrillation: Secondary | ICD-10-CM | POA: Diagnosis not present

## 2015-01-10 LAB — CUP PACEART INCLINIC DEVICE CHECK
Battery Remaining Longevity: 91.2
Date Time Interrogation Session: 20161115155825
HighPow Impedance: 60.75 Ohm
Lead Channel Sensing Intrinsic Amplitude: 11.6 mV
Lead Channel Setting Pacing Amplitude: 2.5 V
Lead Channel Setting Pacing Pulse Width: 0.5 ms
Lead Channel Setting Sensing Sensitivity: 0.5 mV
MDC IDC LEAD IMPLANT DT: 20151228
MDC IDC LEAD LOCATION: 753860
MDC IDC LEAD MODEL: 7122
MDC IDC MSMT LEADCHNL RV IMPEDANCE VALUE: 525 Ohm
MDC IDC MSMT LEADCHNL RV PACING THRESHOLD AMPLITUDE: 0.75 V
MDC IDC MSMT LEADCHNL RV PACING THRESHOLD PULSEWIDTH: 0.5 ms
MDC IDC PG SERIAL: 7233600
MDC IDC STAT BRADY RV PERCENT PACED: 0 %

## 2015-01-10 NOTE — Assessment & Plan Note (Signed)
She is maintaining NSR. She will continue her anticoagulation.

## 2015-01-10 NOTE — Assessment & Plan Note (Signed)
Her device is working normally. Will recheck in several months. 

## 2015-01-10 NOTE — Progress Notes (Signed)
HPI Jo Lynn returns today for ongoing followup after ICD implantation. She is a very pleasant 69 year old woman who sustained a myocardial infarction over a year ago. She is status post percutaneous intervention. Post procedure, she has persistent left ventricular dysfunction, despite maximal medical therapy. She has an ejection fraction of 25% by echo. She has been on diuretic therapy, losartan, and metoprolol. Her blood pressures have been very well controlled.  Her heart failure symptoms are class II. She has not had syncope. She has minimal peripheral edema. No chest pain. She has had an embolism to her right leg. She is now on Eliquis. Her edema is improved.  Current Outpatient Prescriptions  Medication Sig Dispense Refill  . allopurinol (ZYLOPRIM) 100 MG tablet Take 0.5 tablets (50 mg total) by mouth 2 (two) times daily.    Marland Kitchen apixaban (ELIQUIS) 5 MG TABS tablet Take 1 tablet (5 mg total) by mouth 2 (two) times daily. 180 tablet 0  . aspirin EC 81 MG tablet Take 1 tablet (81 mg total) by mouth daily.    Marland Kitchen atorvastatin (LIPITOR) 40 MG tablet Take 1 tablet (40 mg total) by mouth at bedtime. 90 tablet 0  . digoxin (LANOXIN) 0.125 MG tablet Take 1 tablet (0.125 mg total) by mouth daily. 90 tablet 3  . furosemide (LASIX) 40 MG tablet Take 1 tablet (40 mg total) by mouth every other day. May take an extra 40mg  once a day if weight is 182 pounds or greater. 30 tablet 3  . glimepiride (AMARYL) 2 MG tablet Take 1 tablet (2 mg total) by mouth daily with breakfast. 30 tablet 3  . isosorbide-hydrALAZINE (BIDIL) 20-37.5 MG tablet Take 0.5 tablets by mouth 3 (three) times daily. 45 tablet 6  . KLOR-CON M20 20 MEQ tablet Take 1 tablet (20 mEq total) by mouth daily. 30 tablet 5  . pantoprazole (PROTONIX) 40 MG tablet Take 1 tablet (40 mg total) by mouth at bedtime. 90 tablet 0  . sitaGLIPtin (JANUVIA) 25 MG tablet Take 1 tablet (25 mg total) by mouth daily. 30 tablet 0  . spironolactone  (ALDACTONE) 25 MG tablet Take 1 tablet (25 mg total) by mouth every other day. 30 tablet 5   No current facility-administered medications for this visit.     Past Medical History  Diagnosis Date  . Myocardial infarction ([place]) 10/2002    a. 2004 - MV CAD --> Referred for CABG.  . 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 s/p CABGx4 in 2004 with LIMA-LAD, SVG-RPDA, SVG-OM1-OM2. b. Inferior STEMI 08/2013 s/p BMS to occluded SVG-OM1-OM2, and Unsuccessful attempt at [facility] on proximal 100% SVG-RCA. c. Inferoposterior STEMI 05/2014 s/p Scoring balloon PTCA to dSVG-Cx.  . 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 Lucianne Lei Trigt: LIMA-LAD, SVG-RPDA, SVG-OM1-OM2  . Cardiomyopathy, ischemic   . Combined systolic and diastolic congestive heart failure, NYHA class 3 ([place])   . Essential hypertension   . Hyperlipidemia with target LDL less than 70   . Diabetes mellitus type 2 with complications ([place])   . 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 Lovena Le  . Colitis   . Cardiogenic shock ([place])     a. Initiated on milrinone 09/2014.  . Ischemic colitis ([place]) 09/2014  . CKD (chronic kidney disease), stage IV ([place])   . Transaminitis   . Mild pulmonary hypertension ([place])     a. by [place] 09/2014.    ROS:   All systems reviewed and  negative except as noted in the HPI.   Past Surgical History  Procedure Laterality Date  . Coronary artery bypass graft    . Left heart catheterization with coronary angiogram N/A 09/09/2013    Procedure: LEFT HEART CATHETERIZATION WITH CORONARY ANGIOGRAM;  Surgeon: Leonie Man, MD;  Location: [facility] CATH LAB;  Service: Cardiovascular;  Laterality: N/A;  . Implantable cardioverter defibrillator implant  02-21-2014    STJ single chamber ICD implanted by Dr Lovena Le for primary prevention  . Implantable cardioverter defibrillator implant N/A 02/21/2014    Procedure: IMPLANTABLE CARDIOVERTER DEFIBRILLATOR IMPLANT;  Surgeon: Evans Lance, MD;  Location: [facility] CATH  LAB;  Service: Cardiovascular;  Laterality: N/A;  . Left heart catheterization with coronary angiogram N/A 06/13/2014    Procedure: LEFT HEART CATHETERIZATION WITH CORONARY ANGIOGRAM;  Surgeon: Jettie Booze, MD;  Location: [facility] CATH LAB;  Service: Cardiovascular;  Laterality: N/A;  . Cardiac catheterization N/A 10/04/2014    Procedure: Right Heart Cath;  Surgeon: Jolaine Artist, MD;  Location: [place] CV LAB;  Service: Cardiovascular;  Laterality: N/A;     Family History  Problem Relation Age of Onset  . Diabetes Mother   . Diabetes Father   . Heart attack Neg Hx   . Stroke Neg Hx      Social History   Social History  . Marital Status: Single    Spouse Name: N/A  . Number of Children: 4  . Years of Education: N/A   Occupational History  . Not on file.   Social History Main Topics  . Smoking status: Former Smoker    Types: Cigarettes  . Smokeless tobacco: Never Used     Comment: quit many years ago  . Alcohol Use: No  . Drug Use: No  . Sexual Activity: Not on file   Other Topics Concern  . Not on file   Social History Narrative     BP 102/50 mmHg  Pulse 92  Ht 5\' 7"  (1.702 m)  Wt 183 lb (83.008 kg)  BMI 28.66 kg/m2  SpO2 99%  Physical Exam:  Stable  appearing  69 year old woman, NAD HEENT: Unremarkable Neck:  7 cm  JVD, no thyromegally Back:  No CVA tenderness Lungs:  Clear with no wheezes, rales, or rhonchi.  HEART:  Regular rate rhythm, no murmurs, no rubs, no clicks Abd:  soft, positive bowel sounds, no organomegally, no rebound, no guarding Ext:  2 plus pulses, 2+ peripheral edema, no cyanosis, no clubbing Skin:  No rashes no nodules Neuro:  CN II through XII intact, motor grossly intact   Chest x-ray - reviewed  Assess/Plan:

## 2015-01-10 NOTE — Assessment & Plan Note (Signed)
Her symptoms are class 2. She will continue her current meds. She will maintain a low sodium diet. 

## 2015-01-10 NOTE — Patient Instructions (Signed)
Medication Instructions:  Your physician recommends that you continue on your current medications as directed. Please refer to the Current Medication list given to you today.  Labwork: None ordered  Testing/Procedures: None ordered  Follow-Up: Remote monitoring is used to monitor your Pacemaker of ICD from home. This monitoring reduces the number of office visits required to check your device to one time per year. It allows [place] to keep an eye on the functioning of your device to ensure it is working properly. You are scheduled for a device check from home on 04/11/2015. You may send your transmission at any time that day. If you have a wireless device, the transmission will be sent automatically. After your physician reviews your transmission, you will receive a postcard with your next transmission date.  Your physician wants you to follow-up in: 1 year with Dr. Lovena Le. You will receive a reminder letter in the mail two months in advance. If you don't receive a letter, please call our office to schedule the follow-up appointment.   If you need a refill on your cardiac medications before your next appointment, please call your pharmacy.  Thank you for choosing [facility]!!

## 2015-01-12 ENCOUNTER — Telehealth: Payer: Self-pay | Admitting: Nurse Practitioner

## 2015-01-12 DIAGNOSIS — E1122 Type 2 diabetes mellitus with diabetic chronic kidney disease: Secondary | ICD-10-CM

## 2015-01-12 DIAGNOSIS — E1165 Type 2 diabetes mellitus with hyperglycemia: Principal | ICD-10-CM

## 2015-01-12 DIAGNOSIS — IMO0002 Reserved for concepts with insufficient information to code with codable children: Secondary | ICD-10-CM

## 2015-01-12 DIAGNOSIS — N183 Chronic kidney disease, stage 3 (moderate): Principal | ICD-10-CM

## 2015-01-12 MED ORDER — GLIMEPIRIDE 2 MG PO TABS: 2.0000 mg | ORAL_TABLET | Freq: Every day | ORAL | Status: DC

## 2015-01-12 MED ORDER — SITAGLIPTIN PHOSPHATE 25 MG PO TABS: 25.0000 mg | ORAL_TABLET | Freq: Every day | ORAL | Status: DC

## 2015-01-12 NOTE — Telephone Encounter (Signed)
done

## 2015-01-24 ENCOUNTER — Encounter (HOSPITAL_COMMUNITY): Payer: Self-pay | Admitting: Internal Medicine

## 2015-01-24 ENCOUNTER — Ambulatory Visit (HOSPITAL_COMMUNITY)
Admission: RE | Admit: 2015-01-24 | Discharge: 2015-01-24 | Disposition: A | Discharge status: Home / Self Care | Payer: BLUE CROSS/BLUE SHIELD | Source: Ambulatory Visit | Attending: Internal Medicine | Admitting: Internal Medicine

## 2015-01-24 VITALS — BP 112/64 | HR 85 | Wt 184.5 lb

## 2015-01-24 DIAGNOSIS — E1122 Type 2 diabetes mellitus with diabetic chronic kidney disease: Secondary | ICD-10-CM | POA: Diagnosis not present

## 2015-01-24 DIAGNOSIS — Z8249 Family history of ischemic heart disease and other diseases of the circulatory system: Secondary | ICD-10-CM | POA: Diagnosis not present

## 2015-01-24 DIAGNOSIS — I5042 Chronic combined systolic (congestive) and diastolic (congestive) heart failure: Secondary | ICD-10-CM | POA: Insufficient documentation

## 2015-01-24 DIAGNOSIS — E785 Hyperlipidemia, unspecified: Secondary | ICD-10-CM | POA: Diagnosis not present

## 2015-01-24 DIAGNOSIS — I480 Paroxysmal atrial fibrillation: Secondary | ICD-10-CM | POA: Diagnosis not present

## 2015-01-24 DIAGNOSIS — Z7982 Long term (current) use of aspirin: Secondary | ICD-10-CM | POA: Diagnosis not present

## 2015-01-24 DIAGNOSIS — Z79899 Other long term (current) drug therapy: Secondary | ICD-10-CM | POA: Diagnosis not present

## 2015-01-24 DIAGNOSIS — I272 Other secondary pulmonary hypertension: Secondary | ICD-10-CM | POA: Insufficient documentation

## 2015-01-24 DIAGNOSIS — R05 Cough: Secondary | ICD-10-CM | POA: Insufficient documentation

## 2015-01-24 DIAGNOSIS — I129 Hypertensive chronic kidney disease with stage 1 through stage 4 chronic kidney disease, or unspecified chronic kidney disease: Secondary | ICD-10-CM | POA: Insufficient documentation

## 2015-01-24 DIAGNOSIS — I255 Ischemic cardiomyopathy: Secondary | ICD-10-CM | POA: Insufficient documentation

## 2015-01-24 DIAGNOSIS — R109 Unspecified abdominal pain: Secondary | ICD-10-CM | POA: Insufficient documentation

## 2015-01-24 DIAGNOSIS — I5022 Chronic systolic (congestive) heart failure: Secondary | ICD-10-CM | POA: Diagnosis not present

## 2015-01-24 DIAGNOSIS — N184 Chronic kidney disease, stage 4 (severe): Secondary | ICD-10-CM | POA: Insufficient documentation

## 2015-01-24 DIAGNOSIS — I2510 Atherosclerotic heart disease of native coronary artery without angina pectoris: Secondary | ICD-10-CM | POA: Insufficient documentation

## 2015-01-24 DIAGNOSIS — Z95810 Presence of automatic (implantable) cardiac defibrillator: Secondary | ICD-10-CM | POA: Insufficient documentation

## 2015-01-24 DIAGNOSIS — Z7901 Long term (current) use of anticoagulants: Secondary | ICD-10-CM | POA: Diagnosis not present

## 2015-01-24 DIAGNOSIS — Z87891 Personal history of nicotine dependence: Secondary | ICD-10-CM | POA: Insufficient documentation

## 2015-01-24 DIAGNOSIS — I252 Old myocardial infarction: Secondary | ICD-10-CM | POA: Diagnosis not present

## 2015-01-24 DIAGNOSIS — Z951 Presence of aortocoronary bypass graft: Secondary | ICD-10-CM | POA: Diagnosis not present

## 2015-01-24 LAB — BASIC METABOLIC PANEL
Anion gap: 11 (ref 5–15)
BUN: 43 mg/dL — AB (ref 6–20)
CALCIUM: 9.2 mg/dL (ref 8.9–10.3)
CHLORIDE: 106 mmol/L (ref 101–111)
CO2: 22 mmol/L (ref 22–32)
CREATININE: 1.74 mg/dL — AB (ref 0.44–1.00)
GFR, EST AFRICAN AMERICAN: 33 mL/min — AB (ref >60)
GFR, EST NON AFRICAN AMERICAN: 29 mL/min — AB (ref >60)
Glucose, Bld: 390 mg/dL — ABNORMAL HIGH (ref 65–99)
Potassium: 3.9 mmol/L (ref 3.5–5.1)
SODIUM: 139 mmol/L (ref 135–145)

## 2015-01-24 LAB — DIGOXIN LEVEL: DIGOXIN LVL: 1.5 ng/mL (ref 0.8–2.0)

## 2015-01-24 MED ORDER — CARVEDILOL 3.125 MG PO TABS: 3.1250 mg | ORAL_TABLET | Freq: Two times a day (BID) | ORAL | Status: DC

## 2015-01-24 MED ORDER — ISOSORB DINITRATE-HYDRALAZINE 20-37.5 MG PO TABS: 1.0000 | ORAL_TABLET | Freq: Three times a day (TID) | ORAL | Status: DC

## 2015-01-24 NOTE — Patient Instructions (Signed)
Start Carvedilol 3.125 mg Twice daily   Labs today  Your physician recommends that you schedule a follow-up appointment in: 1 month

## 2015-01-24 NOTE — Progress Notes (Signed)
Advanced Heart Failure Clinic Note   Patient ID: Jonia Turowski, female   DOB: January 30, 1946, 69 y.o.   MRN: [id number] PCP: Primary HF Cardiologist: Dr Haroldine Laws Primary Cardiologist: Dr Percival Spanish   HPI: Ms Peron is a 69 y/o woman with h/o DM2, HTN, CAD s/p CABG 2004 with inferior STEMI in 7/15 and STEMI 4/16 with ISR of SVG to LCX graft and underwent PTCA with no stent placement., PAF, CKD, chronic systolic HF with EF [id number].   Admitted to [facility]  09/2014 [facility] with recurrent abdominal pain, marked fatigue, cough, persistent anorexia and orthopnea. Diuresed with IV lasix and required milrinone. Unable to wean milrinone. She remained on milrinone 0.25 mcg. [facility] followed for home milrinone.  Advanced therapies are being considered.  Discharge weight was 179 pounds.   She returns for regular follow up. Milrinone was stopped on 9/23 (co-ox was 67% on 0.125). Initially felt bad for a day with fatigue and nausea but started on digoxin feels better. At last visit we increased Bidil to 1 tab tid. Tolerating well. Says she is feeling better. Does get a mild HA with Bidil but is getting better. Takes tylenol as needed. Can do all ADLs. Can walk around [place] without problem. Daughter says she is doing than she is. No edema, orthopnea or PND.  Weight stable at home. Taking all medications. Lives at home with her daughter.   [place] 10/04/14 RA = 13 RV = 46/8/15 PA = 46/20 (31) PCW = 16 Fick cardiac output/index = 2.7/1.4 PVR = 5.5 WU SVR = 1540  FA sat = 98% PA sat = 36%, 37% Noninvasive BP = 82/57 (65)  ECHO 03/2014 EF 20-25%  ECHO 12/2013 25%   Corevue reviewed today: Stable thoracic impedance. Vest 35.   Labs (9/16): K 4.8, creatinine 1.97, HCT 38.3 Labs (9/22): K 5.0, creatinine 1.9   ROS: All systems negative except as listed in HPI, PMH and Problem List.  SH:  Social History   Social History  . Marital Status: Single    Spouse Name: N/A  . Number of Children: 4  . Years of  Education: N/A   Occupational History  . Not on file.   Social History Main Topics  . Smoking status: Former Smoker    Types: Cigarettes  . Smokeless tobacco: Never Used     Comment: quit many years ago  . Alcohol Use: No  . Drug Use: No  . Sexual Activity: Not on file   Other Topics Concern  . Not on file   Social History Narrative    FH:  Family History  Problem Relation Age of Onset  . Diabetes Mother   . Diabetes Father   . Heart attack Neg Hx   . Stroke Neg Hx     Past Medical History  Diagnosis Date  . Myocardial infarction ([place]) 10/2002    a. 2004 - MV CAD --> Referred for CABG.  . 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 s/p CABGx4 in 2004 with LIMA-LAD, SVG-RPDA, SVG-OM1-OM2. b. Inferior STEMI 08/2013 s/p BMS to occluded SVG-OM1-OM2, and Unsuccessful attempt at [facility] on proximal 100% SVG-RCA. c. Inferoposterior STEMI 05/2014 s/p Scoring balloon PTCA to dSVG-Cx.  . 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 Lucianne Lei Trigt: LIMA-LAD, SVG-RPDA, SVG-OM1-OM2  . Cardiomyopathy, ischemic   . Combined systolic and diastolic congestive heart failure, NYHA class 3 ([place])   . Essential hypertension   . Hyperlipidemia with target LDL less than 70   . Diabetes  mellitus type 2 with complications ([place])   . 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 Lovena Le  . Colitis   . Cardiogenic shock ([place])     a. Initiated on milrinone 09/2014.  . Ischemic colitis ([place]) 09/2014  . CKD (chronic kidney disease), stage IV ([place])   . Transaminitis   . Mild pulmonary hypertension ([place])     a. by [place] 09/2014.    Current Outpatient Prescriptions  Medication Sig Dispense Refill  . allopurinol (ZYLOPRIM) 100 MG tablet Take 0.5 tablets (50 mg total) by mouth 2 (two) times daily.    Marland Kitchen apixaban (ELIQUIS) 5 MG TABS tablet Take 1 tablet (5 mg total) by mouth 2 (two) times daily. 180 tablet 0  . aspirin EC 81 MG tablet Take 1 tablet (81 mg total) by mouth daily.    Marland Kitchen atorvastatin  (LIPITOR) 40 MG tablet Take 1 tablet (40 mg total) by mouth at bedtime. 90 tablet 0  . digoxin (LANOXIN) 0.125 MG tablet Take 1 tablet (0.125 mg total) by mouth daily. 90 tablet 3  . furosemide (LASIX) 40 MG tablet Take 1 tablet (40 mg total) by mouth every other day. May take an extra 40mg  once a day if weight is 182 pounds or greater. 30 tablet 3  . glimepiride (AMARYL) 2 MG tablet Take 1 tablet (2 mg total) by mouth daily with breakfast. 30 tablet 1  . isosorbide-hydrALAZINE (BIDIL) 20-37.5 MG tablet Take 0.5 tablets by mouth 3 (three) times daily. 45 tablet 6  . KLOR-CON M20 20 MEQ tablet Take 1 tablet (20 mEq total) by mouth daily. 30 tablet 5  . pantoprazole (PROTONIX) 40 MG tablet Take 1 tablet (40 mg total) by mouth at bedtime. 90 tablet 0  . sitaGLIPtin (JANUVIA) 25 MG tablet Take 1 tablet (25 mg total) by mouth daily. 30 tablet 1  . spironolactone (ALDACTONE) 25 MG tablet Take 1 tablet (25 mg total) by mouth every other day. 30 tablet 5   No current facility-administered medications for this encounter.    Filed Vitals:   01/24/15 1443  BP: 112/64  Pulse: 85  Weight: 184 lb 8 oz (83.689 kg)  SpO2: 99%    PHYSICAL EXAM:  General:  Elderly woman. No resp difficulty. Daughter present  HEENT: normal Neck: supple. JVP  6 Carotids 2+ bilaterally; no bruits. No lymphadenopathy or thryomegaly appreciated. Cor: PMI normal. Regular rate & rhythm. No rubs, gallops or murmurs. Lungs: clear Abdomen: soft, nontender, nondistended. No hepatosplenomegaly. No bruits or masses. Good bowel sounds. Extremities: no cyanosis, clubbing, rash, no edema  Neuro: alert & orientedx3, cranial nerves grossly intact. Moves all 4 extremities w/o difficulty. Affect pleasant.   ASSESSMENT & PLAN: 1. Chronic combined HF: Ischemic cardiomyopathy.  St. Jude ICD.  EF 20-25%, Grade 3 DD Echo 2/16.  NYHA II symptoms. Milrinone d/c'd 11/18/14 - Volume status looks good. Continue lasix 40 mg every other day and  continue spironolatone 25 mg every other day.  - No ACEI for now with elevated creatinine.  - Continue Bidil to 1 tab tid can take with tylenol as needed.  - Will startt low-dose carvedilol 3.125 bid carefully. - Continue dig 0.125 mg - Can consider Corlanor as needed.  2. CAD s/p CABG 2004; Inferior STEMI 08/2013 s/p BMS to occluded SVG-OM1-OM2, and unsuccessful attempt at [facility] on proximal 100% SVG-RCA; Inferoposterior STEMI 05/2014 s/p scoring balloon PTCA to dSVG-LCx - Continue aspirin given recent MI => no stent placed in 4/16, just  PTCA, so can stop if needed in the future given use of Eliquis.  4. CKD Stage IV: Creatinine 1.9 on recent BMET. Check BMET. 5. PAF: Now in NSR. Continue. Eliquis 5 mg twice daily. No bleeding problems.   Bensimhon, Daniel,MD 3:09 PM

## 2015-02-01 ENCOUNTER — Encounter: Payer: Self-pay | Admitting: Cardiology

## 2015-02-16 ENCOUNTER — Encounter: Payer: Self-pay | Admitting: Internal Medicine

## 2015-03-01 ENCOUNTER — Encounter (HOSPITAL_COMMUNITY): Payer: Self-pay | Admitting: *Deleted

## 2015-03-01 ENCOUNTER — Encounter (HOSPITAL_COMMUNITY): Payer: Self-pay | Admitting: Internal Medicine

## 2015-03-01 ENCOUNTER — Ambulatory Visit (HOSPITAL_COMMUNITY)
Admission: RE | Admit: 2015-03-01 | Discharge: 2015-03-01 | Disposition: A | Discharge status: Home / Self Care | Payer: BLUE CROSS/BLUE SHIELD | Source: Ambulatory Visit | Attending: Internal Medicine | Admitting: Internal Medicine

## 2015-03-01 ENCOUNTER — Encounter (HOSPITAL_COMMUNITY): Payer: Self-pay

## 2015-03-01 VITALS — BP 102/58 | HR 79 | Wt 178.0 lb

## 2015-03-01 DIAGNOSIS — I129 Hypertensive chronic kidney disease with stage 1 through stage 4 chronic kidney disease, or unspecified chronic kidney disease: Secondary | ICD-10-CM | POA: Diagnosis not present

## 2015-03-01 DIAGNOSIS — Z79899 Other long term (current) drug therapy: Secondary | ICD-10-CM | POA: Insufficient documentation

## 2015-03-01 DIAGNOSIS — I480 Paroxysmal atrial fibrillation: Secondary | ICD-10-CM | POA: Insufficient documentation

## 2015-03-01 DIAGNOSIS — I2510 Atherosclerotic heart disease of native coronary artery without angina pectoris: Secondary | ICD-10-CM | POA: Diagnosis not present

## 2015-03-01 DIAGNOSIS — N184 Chronic kidney disease, stage 4 (severe): Secondary | ICD-10-CM | POA: Insufficient documentation

## 2015-03-01 DIAGNOSIS — I255 Ischemic cardiomyopathy: Secondary | ICD-10-CM | POA: Insufficient documentation

## 2015-03-01 DIAGNOSIS — I5022 Chronic systolic (congestive) heart failure: Secondary | ICD-10-CM | POA: Diagnosis not present

## 2015-03-01 DIAGNOSIS — Z7901 Long term (current) use of anticoagulants: Secondary | ICD-10-CM | POA: Diagnosis not present

## 2015-03-01 DIAGNOSIS — Z87891 Personal history of nicotine dependence: Secondary | ICD-10-CM | POA: Insufficient documentation

## 2015-03-01 DIAGNOSIS — Z7982 Long term (current) use of aspirin: Secondary | ICD-10-CM | POA: Insufficient documentation

## 2015-03-01 DIAGNOSIS — I252 Old myocardial infarction: Secondary | ICD-10-CM | POA: Insufficient documentation

## 2015-03-01 DIAGNOSIS — E1122 Type 2 diabetes mellitus with diabetic chronic kidney disease: Secondary | ICD-10-CM | POA: Diagnosis not present

## 2015-03-01 LAB — BASIC METABOLIC PANEL
Anion gap: 10 (ref 5–15)
BUN: 71 mg/dL — AB (ref 6–20)
CO2: 25 mmol/L (ref 22–32)
Calcium: 9.6 mg/dL (ref 8.9–10.3)
Chloride: 100 mmol/L — ABNORMAL LOW (ref 101–111)
Creatinine, Ser: 2.6 mg/dL — ABNORMAL HIGH (ref 0.44–1.00)
GFR calc Af Amer: 20 mL/min — ABNORMAL LOW (ref >60)
GFR, EST NON AFRICAN AMERICAN: 18 mL/min — AB (ref >60)
Glucose, Bld: 506 mg/dL — ABNORMAL HIGH (ref 65–99)
POTASSIUM: 5 mmol/L (ref 3.5–5.1)
SODIUM: 135 mmol/L (ref 135–145)

## 2015-03-01 MED ORDER — DIGOXIN 125 MCG PO TABS: 0.0625 mg | ORAL_TABLET | Freq: Every day | ORAL | Status: DC

## 2015-03-01 MED ORDER — ISOSORB DINITRATE-HYDRALAZINE 20-37.5 MG PO TABS: 1.5000 | ORAL_TABLET | Freq: Three times a day (TID) | ORAL | Status: DC

## 2015-03-01 NOTE — Patient Instructions (Signed)
DECREASE Digoxin to 0.0625 mg (1/2 tab) once daily.  INCREASE Bidil to 1.5 tabs three times daily.  Return in 1-2 weeks for repeat lab work.  Follow up 6-8 weeks.  Do the following things EVERYDAY: 1) Weigh yourself in the morning before breakfast. Write it down and keep it in a log. 2) Take your medicines as prescribed 3) Eat low salt foods-Limit salt (sodium) to 2000 mg per day.  4) Stay as active as you can everyday 5) Limit all fluids for the day to less than 2 liters

## 2015-03-01 NOTE — Progress Notes (Signed)
Attending Provider Statement completed, signed, and faxed on behalf of Dr. Glori Bickers for patient's disability claim with [employer] to provided # [phone]  Renee Pain

## 2015-03-01 NOTE — Progress Notes (Signed)
Patient ID: Jo Lynn, female   DOB: Jun 28, 1945, 70 y.o.   MRN: [id number]   Advanced Heart Failure Clinic Note   Patient ID: Jo Lynn, female   DOB: 1945/04/05, 70 y.o.   MRN: [id number] PCP: Primary HF Cardiologist: Dr Haroldine Laws Primary Cardiologist: Dr Percival Spanish   HPI: [name] is a 70 y/o woman with h/o DM2, HTN, CAD s/p CABG 2004 with inferior STEMI in 7/15 and STEMI 4/16 with ISR of SVG to LCX graft and underwent PTCA with no stent placement., PAF, CKD, chronic systolic HF with EF [id number].   Admitted to [facility]  09/2014 [facility] with recurrent abdominal pain, marked fatigue, cough, persistent anorexia and orthopnea. Diuresed with IV lasix and required milrinone. Unable to wean milrinone. She remained on milrinone 0.25 mcg. [facility] followed for home milrinone.  Advanced therapies are being considered.  Discharge weight was 179 pounds.   She returns for regular follow up. Started low dose coreg at last visit. Denies new fatigue or lightheadedness. Continues to feel well off Milrinone. Weight down 5-6 lbs since last visit. Still has occasional headaches with Bidil, uses tylenol prn. Does her ADLs but doesn't really do much around the house. No problem in grocery store. No swelling, orthopnea or PND.  Weight stable at home. Taking all medications. Lives at home with daughter.  [place] 10/04/14 RA = 13 RV = 46/8/15 PA = 46/20 (31) PCW = 16 Fick cardiac output/index = 2.7/1.4 PVR = 5.5 WU SVR = 1540  FA sat = 98% PA sat = 36%, 37% Noninvasive BP = 82/57 (65)  ECHO 03/2014 EF 20-25%  ECHO 12/2013 25%   Corevue reviewed today: Stable thoracic impedance. Vest 35.   Labs (9/16): K 4.8, creatinine 1.97, HCT 38.3 Labs (9/22): K 5.0, creatinine 1.9   ROS: All systems negative except as listed in HPI, PMH and Problem List.  SH:  Social History   Social History  . Marital Status: Single    Spouse Name: N/A  . Number of Children: 4  . Years of Education: N/A   Occupational  History  . Not on file.   Social History Main Topics  . Smoking status: Former Smoker    Types: Cigarettes  . Smokeless tobacco: Never Used     Comment: quit many years ago  . Alcohol Use: No  . Drug Use: No  . Sexual Activity: Not on file   Other Topics Concern  . Not on file   Social History Narrative    FH:  Family History  Problem Relation Age of Onset  . Diabetes Mother   . Diabetes Father   . Heart attack Neg Hx   . Stroke Neg Hx     Past Medical History  Diagnosis Date  . Myocardial infarction ([place]) 10/2002    a. 2004 - MV CAD --> Referred for CABG.  . 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 s/p CABGx4 in 2004 with LIMA-LAD, SVG-RPDA, SVG-OM1-OM2. b. Inferior STEMI 08/2013 s/p BMS to occluded SVG-OM1-OM2, and Unsuccessful attempt at [facility] on proximal 100% SVG-RCA. c. Inferoposterior STEMI 05/2014 s/p Scoring balloon PTCA to dSVG-Cx.  . 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 Lucianne Lei Trigt: LIMA-LAD, SVG-RPDA, SVG-OM1-OM2  . Cardiomyopathy, ischemic   . Combined systolic and diastolic congestive heart failure, NYHA class 3 ([place])   . Essential hypertension   . Hyperlipidemia with target LDL less than 70   . Diabetes mellitus type 2 with complications ([place])   . 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 Lovena Le  . Colitis   . Cardiogenic shock ([place])     a. Initiated on milrinone 09/2014.  . Ischemic colitis ([place]) 09/2014  . CKD (chronic kidney disease), stage IV ([place])   . Transaminitis   . Mild pulmonary hypertension ([place])     a. by [facility] [place] 09/2014.    Current Outpatient Prescriptions  Medication Sig Dispense Refill  . allopurinol (ZYLOPRIM) 100 MG tablet Take 0.5 tablets (50 mg total) by mouth 2 (two) times daily.    Marland Kitchen apixaban (ELIQUIS) 5 MG TABS tablet Take 1 tablet (5 mg total) by mouth 2 (two) times daily. 180 tablet 0  . aspirin EC 81 MG tablet Take 1 tablet (81 mg total) by mouth daily.    Marland Kitchen atorvastatin (LIPITOR) 40 MG tablet Take 1  tablet (40 mg total) by mouth at bedtime. 90 tablet 0  . carvedilol (COREG) 3.125 MG tablet Take 1 tablet (3.125 mg total) by mouth 2 (two) times daily. 60 tablet 3  . digoxin (LANOXIN) 0.125 MG tablet Take 1 tablet (0.125 mg total) by mouth daily. 90 tablet 3  . furosemide (LASIX) 40 MG tablet Take 1 tablet (40 mg total) by mouth every other day. May take an extra 40mg  once a day if weight is 182 pounds or greater. 30 tablet 3  . glimepiride (AMARYL) 2 MG tablet Take 1 tablet (2 mg total) by mouth daily with breakfast. 30 tablet 1  . isosorbide-hydrALAZINE (BIDIL) 20-37.5 MG tablet Take 1 tablet by mouth 3 (three) times daily. 45 tablet 6  . KLOR-CON M20 20 MEQ tablet Take 1 tablet (20 mEq total) by mouth daily. 30 tablet 5  . pantoprazole (PROTONIX) 40 MG tablet Take 1 tablet (40 mg total) by mouth at bedtime. 90 tablet 0  . sitaGLIPtin (JANUVIA) 25 MG tablet Take 1 tablet (25 mg total) by mouth daily. 30 tablet 1  . spironolactone (ALDACTONE) 25 MG tablet Take 1 tablet (25 mg total) by mouth every other day. 30 tablet 5   No current facility-administered medications for this encounter.    Filed Vitals:   03/01/15 1128  BP: 102/58  Pulse: 79  Weight: 178 lb (80.74 kg)  SpO2: 98%   Wt Readings from Last 3 Encounters:  03/01/15 178 lb (80.74 kg)  01/24/15 184 lb 8 oz (83.689 kg)  01/10/15 183 lb (83.008 kg)    PHYSICAL EXAM:  General:  Elderly woman. No resp difficulty. Daughter present  HEENT: normal Neck: supple. JVP 6-7 cm. Carotids 2+ bilaterally; no bruits. No thyromegaly or nodule noted. Cor: PMI normal. RRR, no MGR Lungs: CTAB, normal effort Abdomen: soft, NT, ND, no HSM. No bruits or masses. +BS  Extremities: no cyanosis, clubbing, rash, no edema  Neuro: alert & orientedx3, cranial nerves grossly intact. Moves all 4 extremities w/o difficulty. Affect pleasant.   ASSESSMENT & PLAN: 1. Chronic combined HF: Ischemic cardiomyopathy.  St. Jude ICD.  EF 20-25%, Grade 3 DD  Echo 2/16.  NYHA III symptoms. Milrinone d/c'd 11/18/14 - Volume status stable.  - Continue lasix 40 mg qod and spironolatone 25 mg qod - No ACEI for now with elevated creatinine on last BMET. Will recheck today.  - Increase Bidil 1.5 tab tid, Use tylenol for headaches.  - Continue low-dose carvedilol 3.125 bid. No uptitration with Bidil adjustment. - Decrease dig 0.0625 mg, Recheck dig level in 7-10 days. - Can consider corlanor as needed after b-blocker titrated - Encouraged to  walk at least 10 minutes daily. 2. CAD s/p CABG 2004; Inferior STEMI 08/2013 s/p BMS to occluded SVG-OM1-OM2, and unsuccessful attempt at [facility] on proximal 100% SVG-RCA; Inferoposterior STEMI 05/2014 s/p scoring balloon PTCA to dSVG-LCx - Continue aspirin, Can stop if needed in the future with Eliquis.  4. CKD Stage IV: BMET today. Stable at last check. 5. PAF: Remains in NSR by exam. Continue Eliquis 5 mg BID. No bleeding problems.   Shirley Friar, PA-C 11:37 AM  Patient seen and examined with Oda Kilts, PA-C. We discussed all aspects of the encounter. I agree with the assessment and plan as stated above.   She is stable NYHA III. Volume status ok. Tolerating low-dose b-blocker but given recent need for inotropes I am hesitant to titrate further at this time. Will increase Bidil to 1.5 tabs tid and follow. Check BMET today. NO ACE/ARB with CKD. Remains in NSR. Will continue Eliquis.   Bensimhon, Daniel,MD 4:48 PM

## 2015-03-02 ENCOUNTER — Encounter: Payer: Self-pay | Admitting: Nurse Practitioner

## 2015-03-02 ENCOUNTER — Ambulatory Visit (INDEPENDENT_AMBULATORY_CARE_PROVIDER_SITE_OTHER): Payer: BLUE CROSS/BLUE SHIELD | Admitting: Nurse Practitioner

## 2015-03-02 VITALS — BP 126/71 | HR 84 | Temp 97.0°F | Ht 67.0 in | Wt 178.0 lb

## 2015-03-02 DIAGNOSIS — I5043 Acute on chronic combined systolic (congestive) and diastolic (congestive) heart failure: Secondary | ICD-10-CM | POA: Diagnosis not present

## 2015-03-02 DIAGNOSIS — E876 Hypokalemia: Secondary | ICD-10-CM

## 2015-03-02 DIAGNOSIS — N184 Chronic kidney disease, stage 4 (severe): Secondary | ICD-10-CM | POA: Diagnosis not present

## 2015-03-02 DIAGNOSIS — I5022 Chronic systolic (congestive) heart failure: Secondary | ICD-10-CM | POA: Diagnosis not present

## 2015-03-02 DIAGNOSIS — R531 Weakness: Secondary | ICD-10-CM | POA: Diagnosis not present

## 2015-03-02 DIAGNOSIS — E1165 Type 2 diabetes mellitus with hyperglycemia: Secondary | ICD-10-CM | POA: Diagnosis not present

## 2015-03-02 DIAGNOSIS — E785 Hyperlipidemia, unspecified: Secondary | ICD-10-CM

## 2015-03-02 DIAGNOSIS — I5023 Acute on chronic systolic (congestive) heart failure: Secondary | ICD-10-CM

## 2015-03-02 DIAGNOSIS — IMO0002 Reserved for concepts with insufficient information to code with codable children: Secondary | ICD-10-CM

## 2015-03-02 DIAGNOSIS — I10 Essential (primary) hypertension: Secondary | ICD-10-CM | POA: Diagnosis not present

## 2015-03-02 DIAGNOSIS — K219 Gastro-esophageal reflux disease without esophagitis: Secondary | ICD-10-CM | POA: Diagnosis not present

## 2015-03-02 DIAGNOSIS — N183 Chronic kidney disease, stage 3 (moderate): Secondary | ICD-10-CM | POA: Diagnosis not present

## 2015-03-02 DIAGNOSIS — E1122 Type 2 diabetes mellitus with diabetic chronic kidney disease: Secondary | ICD-10-CM | POA: Diagnosis not present

## 2015-03-02 DIAGNOSIS — I480 Paroxysmal atrial fibrillation: Secondary | ICD-10-CM

## 2015-03-02 DIAGNOSIS — I2510 Atherosclerotic heart disease of native coronary artery without angina pectoris: Secondary | ICD-10-CM

## 2015-03-02 LAB — POCT GLYCOSYLATED HEMOGLOBIN (HGB A1C): Hemoglobin A1C: 11.4

## 2015-03-02 MED ORDER — PANTOPRAZOLE SODIUM 40 MG PO TBEC: 40.0000 mg | DELAYED_RELEASE_TABLET | Freq: Every day | ORAL | Status: DC

## 2015-03-02 MED ORDER — ATORVASTATIN CALCIUM 40 MG PO TABS: 40.0000 mg | ORAL_TABLET | Freq: Every day | ORAL | Status: DC

## 2015-03-02 MED ORDER — SPIRONOLACTONE 25 MG PO TABS: 25.0000 mg | ORAL_TABLET | ORAL | Status: DC

## 2015-03-02 MED ORDER — APIXABAN 5 MG PO TABS: 5.0000 mg | ORAL_TABLET | Freq: Two times a day (BID) | ORAL | Status: DC

## 2015-03-02 MED ORDER — SITAGLIPTIN PHOSPHATE 25 MG PO TABS: 25.0000 mg | ORAL_TABLET | Freq: Every day | ORAL | Status: DC

## 2015-03-02 MED ORDER — GLIMEPIRIDE 2 MG PO TABS: 2.0000 mg | ORAL_TABLET | Freq: Every day | ORAL | Status: DC

## 2015-03-02 NOTE — Patient Instructions (Signed)

## 2015-03-02 NOTE — Progress Notes (Signed)
Subjective:    Patient ID: Jo Lynn, female    DOB: Aug 02, 1945, 70 y.o.   MRN: [id number]  Patient here today for follow up of chronic medical problems. She has been in and out of the hospital a lot this year mainly with her heart. She has n ot been in the hospital since last visit. She has been taken out of work. She continues to feel tired and weak. Saw cardiologist yesterday   Hypertension This is a chronic problem. The problem is unchanged. The problem is controlled. Pertinent negatives include no chest pain, headaches, palpitations or shortness of breath. Risk factors for coronary artery disease include stress, dyslipidemia and post-menopausal state. Past treatments include angiotensin blockers, diuretics and beta blockers. The current treatment provides significant improvement. There are no compliance problems.  Hypertensive end-organ damage includes CAD/MI.  Hyperlipidemia This is a chronic problem. The current episode started more than 1 year ago. The problem is uncontrolled. Recent lipid tests were reviewed and are high. Exacerbating diseases include diabetes. She has no history of hypothyroidism or obesity. Pertinent negatives include no chest pain or shortness of breath. Current antihyperlipidemic treatment includes statins. The current treatment provides moderate improvement of lipids. There are no compliance problems.  Risk factors for coronary artery disease include dyslipidemia, family history, hypertension and post-menopausal.  Diabetes She presents for her follow-up diabetic visit. She has type 2 diabetes mellitus. No MedicAlert identification noted. Her disease course has been improving. Pertinent negatives for hypoglycemia include no headaches. Pertinent negatives for diabetes include no chest pain, no polydipsia, no polyphagia, no polyuria, no visual change and no weakness. Symptoms are improving. Diabetic complications include heart disease. Pertinent negatives for diabetic  complications include no peripheral neuropathy. Risk factors for coronary artery disease include stress, obesity, hypertension, dyslipidemia and family history. When asked about current treatments, none were reported. She is compliant with treatment most of the time. Her weight is stable. When asked about meal planning, she reported none. She has not had a previous visit with a dietitian. She rarely participates in exercise. Her breakfast blood glucose is taken between 9-10 am. Her breakfast blood glucose range is generally 180-200 mg/dl. Her overall blood glucose range is 180-200 mg/dl. An ACE inhibitor/angiotensin II receptor blocker is being taken. She does not see a podiatrist.Eye exam is not current.  CAD /CHF Cardiologist increased her lopressor- she is doing well but they want to put a defibrillator but [patient told them no.  Patient encouraged to get done. Atrial fib Currently on eliquis- no c/o bleeding- no palpitations     Review of Systems  Constitutional: Negative.   HENT: Negative.   Respiratory: Negative for shortness of breath.   Cardiovascular: Negative for chest pain and palpitations.  Endocrine: Negative for polydipsia, polyphagia and polyuria.  Neurological: Negative for weakness and headaches.  All other systems reviewed and are negative.      Objective:   Physical Exam  Constitutional: She is oriented to person, place, and time. She appears well-developed and well-nourished.  HENT:  Nose: Nose normal.  Mouth/Throat: Oropharynx is clear and moist.  Eyes: EOM are normal.  Neck: Trachea normal, normal range of motion and full passive range of motion without pain. Neck supple. No JVD present. Carotid bruit is not present. No thyromegaly present.  Cardiovascular: Normal rate, regular rhythm, normal heart sounds and intact distal pulses.  Exam reveals no gallop and no friction rub.   No murmur heard. Pulmonary/Chest: Effort normal and breath sounds normal.  Abdominal:  Soft.  Bowel sounds are normal. She exhibits no distension and no mass. There is no tenderness.  Musculoskeletal: Normal range of motion.  Lymphadenopathy:    She has no cervical adenopathy.  Neurological: She is alert and oriented to person, place, and time. She has normal reflexes.  Skin: Skin is warm and dry.  Psychiatric: She has a normal mood and affect. Her behavior is normal. Judgment and thought content normal.    BP 126/71 mmHg  Pulse 84  Temp(Src) 97 F (36.1 C) (Oral)  Ht 5\' 7"  (1.702 m)  Wt 178 lb (80.74 kg)  BMI 27.87 kg/m2   Results for orders placed or performed in visit on 03/02/15  POCT glycosylated hemoglobin (Hb A1C)  Result Value Ref Range   Hemoglobin A1C 11.4            Assessment & Plan:  1. Uncontrolled type 2 diabetes mellitus with stage 3 chronic kidney disease, without long-term current use of insulin ([place]) Needs to see T. Eckerd for diabetes- may need insulin - POCT glycosylated hemoglobin (Hb A1C) - Microalbumin / creatinine urine ratio - glimepiride (AMARYL) 2 MG tablet; Take 1 tablet (2 mg total) by mouth daily with breakfast.  Dispense: 30 tablet; Refill: 1 - sitaGLIPtin (JANUVIA) 25 MG tablet; Take 1 tablet (25 mg total) by mouth daily.  Dispense: 30 tablet; Refill: 1  2. Essential hypertension Do not add salt to diet - apixaban (ELIQUIS) 5 MG TABS tablet; Take 1 tablet (5 mg total) by mouth 2 (two) times daily.  Dispense: 180 tablet; Refill: 0  3. Hyperlipidemia with target LDL less than 70 Low fta diet - Lipid panel - atorvastatin (LIPITOR) 40 MG tablet; Take 1 tablet (40 mg total) by mouth at bedtime.  Dispense: 90 tablet; Refill: 0  4. Acute on chronic systolic CHF (congestive heart failure) ([place]) Keep follow up with cardiologist  5. Paroxysmal atrial fibrillation (HCC)  6. Acute on chronic combined systolic and diastolic congestive heart failure ([place])  7. Gastroesophageal reflux disease without esophagitis Avoid spicy  foods Do not eat 2 hours prior to bedtime - pantoprazole (PROTONIX) 40 MG tablet; Take 1 tablet (40 mg total) by mouth at bedtime.  Dispense: 90 tablet; Refill: 0  8. CKD (chronic kidney disease), stage IV ([place]) Keep follow up with nephroloist  9. Hypokalemia   10. Generalized weakness exercise  11. Chronic systolic CHF (congestive heart failure) (HCC) - spironolactone (ALDACTONE) 25 MG tablet; Take 1 tablet (25 mg total) by mouth every other day.  Dispense: 30 tablet; Refill: 5    Labs pending Health maintenance reviewed Diet and exercise encouraged Continue all meds Follow up  In 3 month   [place], FNP

## 2015-03-03 LAB — LIPID PANEL
CHOLESTEROL TOTAL: 285 mg/dL — AB (ref 100–199)
Chol/HDL Ratio: 9.8 ratio units — ABNORMAL HIGH (ref 0.0–4.4)
HDL: 29 mg/dL — AB (ref >39)
LDL Calculated: 179 mg/dL — ABNORMAL HIGH (ref 0–99)
TRIGLYCERIDES: 386 mg/dL — AB (ref 0–149)
VLDL CHOLESTEROL CAL: 77 mg/dL — AB (ref 5–40)

## 2015-03-06 ENCOUNTER — Ambulatory Visit (INDEPENDENT_AMBULATORY_CARE_PROVIDER_SITE_OTHER): Payer: BLUE CROSS/BLUE SHIELD | Admitting: Pharmacist

## 2015-03-06 ENCOUNTER — Encounter: Payer: Self-pay | Admitting: Pharmacist

## 2015-03-06 VITALS — BP 136/80 | HR 74 | Ht 67.0 in | Wt 177.0 lb

## 2015-03-06 DIAGNOSIS — Z794 Long term (current) use of insulin: Secondary | ICD-10-CM | POA: Diagnosis not present

## 2015-03-06 DIAGNOSIS — E1165 Type 2 diabetes mellitus with hyperglycemia: Secondary | ICD-10-CM

## 2015-03-06 DIAGNOSIS — R7309 Other abnormal glucose: Secondary | ICD-10-CM | POA: Diagnosis not present

## 2015-03-06 DIAGNOSIS — E1122 Type 2 diabetes mellitus with diabetic chronic kidney disease: Secondary | ICD-10-CM

## 2015-03-06 DIAGNOSIS — N184 Chronic kidney disease, stage 4 (severe): Secondary | ICD-10-CM

## 2015-03-06 DIAGNOSIS — I2510 Atherosclerotic heart disease of native coronary artery without angina pectoris: Secondary | ICD-10-CM

## 2015-03-06 DIAGNOSIS — IMO0002 Reserved for concepts with insufficient information to code with codable children: Secondary | ICD-10-CM

## 2015-03-06 LAB — POCT URINALYSIS DIPSTICK
BILIRUBIN UA: NEGATIVE
Glucose, UA: 500
KETONES UA: NEGATIVE
Nitrite, UA: NEGATIVE
PH UA: 6
Protein, UA: NEGATIVE
Spec Grav, UA: 1.01
Urobilinogen, UA: NEGATIVE

## 2015-03-06 MED ORDER — INSULIN PEN NEEDLE 32G X 4 MM MISC: Status: AC

## 2015-03-06 MED ORDER — INSULIN GLARGINE 100 UNIT/ML SOLOSTAR PEN: PEN_INJECTOR | SUBCUTANEOUS | Status: DC

## 2015-03-06 NOTE — Progress Notes (Signed)
Subjective:    Jo Lynn is a 70 y.o. female who presents for an initial evaluation of Type 2 diabetes mellitus.  Her most recent A1c was 11.4% which was up from 8.4%.  She is taking [place] 25mg  once daily(dose adjusted due to low GFR) and glimepiride 2mg  once daily. Current symptoms/problems include polydipsia and polyuria and have been worsening. Initially diagnosed with DM about 25 years ago.  Only family member with DM is her sister. Patient has refused insulin in the past.  Known diabetic complications: nephropathy and cardiovascular disease Cardiovascular risk factors: advanced age (older than 16 for men, 66 for women), diabetes mellitus, dyslipidemia, hypertension and sedentary lifestyle  Eye exam current (within one year): yes Weight trend: stable Prior visit with CDE: yes -   Current diet: in general, a "healthy" diet  , but patient is only eating 1 meal per day Current exercise: none  Current monitoring regimen: home blood tests - 1-2 times daily Home blood sugar records: patient self reports that BG has been in 180's to 300's.  Any episodes of hypoglycemia? no  Is She on ACE inhibitor or angiotensin II receptor blocker?  No, due to low GFR      Objective:    BP 136/80 mmHg  Pulse 74  Ht 5\' 7"  (1.702 m)  Wt 177 lb (80.287 kg)  BMI 27.72 kg/m2   Lab Review GLUCOSE (mg/dL)  Date Value  06/22/2014 148*  06/01/2014 308*  05/24/2014 126*   GLUCOSE, BLD (mg/dL)  Date Value  03/01/2015 506*  01/24/2015 390*  11/23/2014 127*   CO2 (mmol/L)  Date Value  03/01/2015 25  01/24/2015 22  11/23/2014 25   BUN (mg/dL)  Date Value  03/01/2015 71*  01/24/2015 43*  11/23/2014 69*  06/22/2014 34*  06/01/2014 111*  05/24/2014 108*   CREAT (mg/dL)  Date Value  03/31/2014 1.99*   CREATININE, SER (mg/dL)  Date Value  03/01/2015 2.60*  01/24/2015 1.74*  11/23/2014 1.87*    RBG was 450 in office today.  Checked urine for ketones and ws negative.    Assessment:    Diabetes Mellitus type II, under inadequate control.    Plan:    1.  Rx changes: started Basaglar insulin 10 units once daily.  Increase by 2 units every 2 days until FBG is less than 150.  Patient and daughter are taught how to injection insulin with pen and first dose is given by daughter in office (patient admits it was much easier than she had imagined).  Also instructed on site selection, rotation of injection site and proper storage of insulin.  D/C protonix - patient was started in hospital but states has never experienced GERD or reflux . If she does have GI symptoms she can restart.  2.  Education: Reviewed 'ABCs' of diabetes management (respective goals in parentheses):  A1C (<7), blood pressure (<130/80), and cholesterol (LDL <100). 3.  Continue to check BG 1-2 times daily 4.  Reviewed CHO counting.  Patient is encouraged to eat 3 small meals per day and discussed some ideas for quick small meals.  5.  Encouraged increased physical activity such as walking.  Recommended start with 15 minutes and work up to 30 minutes at least 4 days per week as able 6.  Reviewed s/s of hypoglycemia.  RTC in 1 month to recheck DM and for AWV  Cherre Robins, PharmD, CPP, CDE .

## 2015-03-06 NOTE — Patient Instructions (Signed)
Start Basaglar insulin (long acting) - inject 10 units once a day at same time each day.  Continue to check blood glucose once a day.  Increase insulin by 2 units every 2 days until blood glucose is less than 150 or if you have a low blood glucose reading (less than 70.    Hypoglycemia Hypoglycemia occurs when the glucose in your blood is too low. Glucose is a type of sugar that is your body's main energy source. Hormones, such as insulin and glucagon, control the level of glucose in the blood. Insulin lowers blood glucose and glucagon increases blood glucose. Having too much insulin in your blood stream, or not eating enough food containing sugar, can result in hypoglycemia. Hypoglycemia can happen to people with or without diabetes. It can develop quickly and can be a medical emergency.  CAUSES   Missing or delaying meals.  Not eating enough carbohydrates at meals.  Taking too much diabetes medicine.  Not timing your oral diabetes medicine or insulin doses with meals, snacks, and exercise.  Nausea and vomiting.  Certain medicines.  Severe illnesses, such as hepatitis, kidney disorders, and certain eating disorders.  Increased activity or exercise without eating something extra or adjusting medicines.  Drinking too much alcohol.  A nerve disorder that affects body functions like your heart rate, blood pressure, and digestion (autonomic neuropathy).  A condition where the stomach muscles do not function properly (gastroparesis). Therefore, medicines and food may not absorb properly.  Rarely, a tumor of the pancreas can produce too much insulin. SYMPTOMS   Hunger.  Sweating (diaphoresis).  Change in body temperature.  Shakiness.  Headache.  Anxiety.  Lightheadedness.  Irritability.  Difficulty concentrating.  Dry mouth.  Tingling or numbness in the hands or feet.  Restless sleep or sleep disturbances.  Altered speech and coordination.  Change in mental  status.  Seizures or prolonged convulsions.  Combativeness.  Drowsiness (lethargic).  Weakness.  Increased heart rate or palpitations.  Confusion.  Pale, gray skin color.  Blurred or double vision.  Fainting. DIAGNOSIS  A physical exam and medical history will be performed. Your caregiver may make a diagnosis based on your symptoms. Blood tests and other lab tests may be performed to confirm a diagnosis. Once the diagnosis is made, your caregiver will see if your signs and symptoms go away once your blood glucose is raised.  TREATMENT  Usually, you can easily treat your hypoglycemia when you notice symptoms.  Check your blood glucose. If it is less than 70 mg/dl, take one of the following:   3-4 glucose tablets.    cup juice.    cup regular soda.   1 cup skim milk.   -1 tube of glucose gel.   5-6 hard candies.   Avoid high-fat drinks or food that may delay a rise in blood glucose levels.  Do not take more than the recommended amount of sugary foods, drinks, gel, or tablets. Doing so will cause your blood glucose to go too high.   Wait 10-15 minutes and recheck your blood glucose. If it is still less than 70 mg/dl or below your target range, repeat treatment.   Eat a snack if it is more than 1 hour until your next meal.  There may be a time when your blood glucose may go so low that you are unable to treat yourself at home when you start to notice symptoms. You may need someone to help you. You may even faint or be unable to  swallow. If you cannot treat yourself, someone will need to bring you to the hospital.  [place]  If you have diabetes, follow your diabetes management plan by:  Taking your medicines as directed.  Following your exercise plan.  Following your meal plan. Do not skip meals. Eat on time.  Testing your blood glucose regularly. Check your blood glucose before and after exercise. If you exercise longer or different than  usual, be sure to check blood glucose more frequently.  Wearing your medical alert jewelry that says you have diabetes.  Identify the cause of your hypoglycemia. Then, develop ways to prevent the recurrence of hypoglycemia.  Do not take a hot bath or shower right after an insulin shot.  Always carry treatment with you. Glucose tablets are the easiest to carry.  If you are going to drink alcohol, drink it only with meals.  Tell friends or family members ways to keep you safe during a seizure. This may include removing hard or sharp objects from the area or turning you on your side.  Maintain a healthy weight. SEEK MEDICAL CARE IF:   You are having problems keeping your blood glucose in your target range.  You are having frequent episodes of hypoglycemia.  You feel you might be having side effects from your medicines.  You are not sure why your blood glucose is dropping so low.  You notice a change in vision or a new problem with your vision. SEEK IMMEDIATE MEDICAL CARE IF:   Confusion develops.  A change in mental status occurs.  The inability to swallow develops.  Fainting occurs.   This information is not intended to replace advice given to you by your health care provider. Make sure you discuss any questions you have with your health care provider.   Document Released: 02/11/2005 Document Revised: 02/16/2013 Document Reviewed: 10/18/2014 Elsevier Interactive Patient Education [employer].

## 2015-03-07 ENCOUNTER — Telehealth (HOSPITAL_COMMUNITY): Payer: Self-pay

## 2015-03-07 LAB — PROTEIN / CREATININE RATIO, URINE
CREATININE, UR: 30 mg/dL
PROTEIN UR: 4.4 mg/dL
PROTEIN/CREAT RATIO: 147 mg/g{creat} (ref 0–200)

## 2015-03-07 LAB — URINE CULTURE

## 2015-03-07 NOTE — Telephone Encounter (Signed)
Patient left VM on CHF triage line to call her back, did not specify her needs. This RN left return VM on her line to call us back and specify needs if she has to leave another VM if I am on the other line or away from the phone. Will follow up later today.  Renee Pain

## 2015-03-10 ENCOUNTER — Ambulatory Visit (HOSPITAL_COMMUNITY)
Admission: RE | Admit: 2015-03-10 | Discharge: 2015-03-10 | Disposition: A | Discharge status: Home / Self Care | Payer: BLUE CROSS/BLUE SHIELD | Source: Ambulatory Visit | Attending: Internal Medicine | Admitting: Internal Medicine

## 2015-03-10 ENCOUNTER — Telehealth: Payer: Self-pay | Admitting: Nurse Practitioner

## 2015-03-10 DIAGNOSIS — I5022 Chronic systolic (congestive) heart failure: Secondary | ICD-10-CM | POA: Diagnosis not present

## 2015-03-10 LAB — BASIC METABOLIC PANEL
ANION GAP: 15 (ref 5–15)
BUN: 69 mg/dL — ABNORMAL HIGH (ref 6–20)
CALCIUM: 9.9 mg/dL (ref 8.9–10.3)
CO2: 22 mmol/L (ref 22–32)
Chloride: 99 mmol/L — ABNORMAL LOW (ref 101–111)
Creatinine, Ser: 2.54 mg/dL — ABNORMAL HIGH (ref 0.44–1.00)
GFR calc Af Amer: 21 mL/min — ABNORMAL LOW (ref >60)
GFR calc non Af Amer: 18 mL/min — ABNORMAL LOW (ref >60)
GLUCOSE: 304 mg/dL — AB (ref 65–99)
POTASSIUM: 4.8 mmol/L (ref 3.5–5.1)
Sodium: 136 mmol/L (ref 135–145)

## 2015-03-10 NOTE — Telephone Encounter (Signed)
I sent in Rx for #1 box of insulin pens.  The pharmacy cannot dispense in smaller quantities than 1 box.  Debbi Thornton Papas if working on getting override to dispense 1 box.  Patient was given #1 pen sample in office and this should last for at least 15 days.

## 2015-03-13 ENCOUNTER — Ambulatory Visit: Payer: BLUE CROSS/BLUE SHIELD | Admitting: Nurse Practitioner

## 2015-03-21 ENCOUNTER — Telehealth: Payer: Self-pay

## 2015-03-21 NOTE — Telephone Encounter (Signed)
Insurance denied [employer] stating that it is not a covered benefit and excluded from coverage

## 2015-03-22 MED ORDER — INSULIN GLARGINE 100 UNIT/ML SOLOSTAR PEN: PEN_INJECTOR | SUBCUTANEOUS | Status: DC

## 2015-03-22 NOTE — Telephone Encounter (Signed)
Reviewed PA paperwork that was sent to our office - appears that rejection is regardin the need for PA for more than 30 day supply not or product its self.   Will have Debbi call speicific PA number given to see if can get approved as we will run into a similar  day supply issue with any insulin that we use.

## 2015-03-22 NOTE — Telephone Encounter (Signed)
Spoke with [facility] - actually of the long acting insulins, only Lantus is covered.  Switched to Lantus and Rx sent in.  Patient and [name] notified.  Jo Lynn states that Jo Lynn is currently taking 24 units of long acting insulin daily with plans to increase to 25 units tomorrow.  I have amended directions on new Lantus Rx.

## 2015-04-07 ENCOUNTER — Ambulatory Visit (INDEPENDENT_AMBULATORY_CARE_PROVIDER_SITE_OTHER): Payer: BLUE CROSS/BLUE SHIELD | Admitting: Pharmacist

## 2015-04-07 ENCOUNTER — Encounter: Payer: Self-pay | Admitting: Pharmacist

## 2015-04-07 VITALS — BP 112/62 | HR 68 | Ht 67.0 in | Wt 179.0 lb

## 2015-04-07 DIAGNOSIS — Z0000 Encounter for general adult medical examination without abnormal findings: Secondary | ICD-10-CM

## 2015-04-07 NOTE — Patient Instructions (Addendum)
  Jo Lynn , Thank you for taking time to come for your Medicare Wellness Visit. I appreciate your ongoing commitment to your health goals. Please review the following plan we discussed and let me know if I can assist you in the future.   These are the goals we discussed: Recommend start Chair Exercises from handout given or walk daily Will get DEXA at next appointment Continue Lantus - use 25 units daily Stop Januvia   This is a list of the screening recommended for you and due dates:  Health Maintenance  Topic Date Due  .    Marland Kitchen Pneumonia vaccines (2 of 2 - PPSV23) 01/11/2015  . DEXA scan (bone density measurement)  05/30/2015*  . Colon Cancer Screening  05/30/2015*  . Shingles Vaccine  05/30/2015*  . Stool Blood Test  05/31/2015*  . Eye exam for diabetics  05/12/2015  . Hemoglobin A1C  08/30/2015  . Flu Shot  09/26/2015  . Complete foot exam   11/29/2015  . Mammogram  08/03/2016  . Tetanus Vaccine  04/22/2023  .  Hepatitis C: One time screening is recommended by Center for Disease Control  (CDC) for  adults born from [birth year] through 1966.   Completed  *Topic was postponed. The date shown is not the original due date.

## 2015-04-07 NOTE — Progress Notes (Signed)
Patient ID: Jo Lynn, female   DOB: [birth year], 70 y.o.   MRN: [id number]    Subjective:   Jo Lynn is a 70 y.o. black, female who presents for an Initial Medicare Annual Wellness Visit and to recheck BG.  Patient lives with her daughter and she is present with patient today.  About 1 month ago Jo Lynn started lantus - she is currently taking 28 units daily but today BG was 80.  Yesterday BG was 68 and she has taken Lantus 30 units.  She is also taking glimepiride '2mg'$  qam and Januvia '25mg'$  1 tablet daily but she has been out of Januvia for 5 days because it requires step therapy per her insurance (they are requiring trial of metformin which patient cannot take due to current eGFR of 18)  Review of Systems  Review of Systems  Constitutional: Negative.   HENT: Negative.   Eyes: Negative.   Respiratory: Negative.   Cardiovascular: Negative.   Gastrointestinal: Negative.   Genitourinary: Negative.   Musculoskeletal: Negative.   Skin: Negative.   Neurological: Negative.   Endo/Heme/Allergies: Negative.   Psychiatric/Behavioral: Negative.      Current Medications (verified) Outpatient Encounter Prescriptions as of 04/07/2015  Medication Sig  . apixaban (ELIQUIS) 5 MG TABS tablet Take 1 tablet (5 mg total) by mouth 2 (two) times daily.  Marland Kitchen aspirin EC 81 MG tablet Take 1 tablet (81 mg total) by mouth daily.  Marland Kitchen atorvastatin (LIPITOR) 40 MG tablet Take 1 tablet (40 mg total) by mouth at bedtime.  . carvedilol (COREG) 3.125 MG tablet Take 1 tablet (3.125 mg total) by mouth 2 (two) times daily.  . digoxin (LANOXIN) 0.125 MG tablet Take 0.5 tablets (0.0625 mg total) by mouth daily.  . furosemide (LASIX) 40 MG tablet Take 1 tablet (40 mg total) by mouth every other day. May take an extra '40mg'$  once a day if weight is 182 pounds or greater.  Marland Kitchen glimepiride (AMARYL) 2 MG tablet Take 1 tablet (2 mg total) by mouth daily with breakfast.  . Insulin Glargine (LANTUS SOLOSTAR) 100 UNIT/ML  Solostar Pen Inject up to 30 units once daily  . Insulin Pen Needle 32G X 4 MM MISC Use with Basaglar insulin pen to inject once daily  . isosorbide-hydrALAZINE (BIDIL) 20-37.5 MG tablet Take 1.5 tablets by mouth 3 (three) times daily.  Marland Kitchen KLOR-CON M20 20 MEQ tablet Take 1 tablet (20 mEq total) by mouth daily.  Marland Kitchen spironolactone (ALDACTONE) 25 MG tablet Take 1 tablet (25 mg total) by mouth every other day.  . allopurinol (ZYLOPRIM) 100 MG tablet Take 0.5 tablets (50 mg total) by mouth 2 (two) times daily. (Patient not taking: Reported on 04/07/2015)  . [DISCONTINUED] sitaGLIPtin (JANUVIA) 25 MG tablet Take 1 tablet (25 mg total) by mouth daily. (Patient not taking: Reported on 04/07/2015)   No facility-administered encounter medications on file as of 04/07/2015.    Allergies (verified) Prednisone   History: Past Medical History  Diagnosis Date  . Myocardial infarction ([place]) 10/2002    a. 2004 - MV CAD --> Referred for CABG.  . 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 s/p CABGx4 in 2004 with LIMA-LAD, SVG-RPDA, SVG-OM1-OM2. b. Inferior STEMI 08/2013 s/p BMS to occluded SVG-OM1-OM2, and Unsuccessful attempt at [facility] on proximal 100% SVG-RCA. c. Inferoposterior STEMI 05/2014 s/p Scoring balloon PTCA to dSVG-Cx.  . 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 Lucianne Lei Trigt: LIMA-LAD, SVG-RPDA, SVG-OM1-OM2  . Cardiomyopathy, ischemic   . Combined systolic and diastolic  congestive heart failure, NYHA class 3 ([place])   . Essential hypertension   . Hyperlipidemia with target LDL less than 70   . Diabetes mellitus type 2 with complications ([place])   . 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 Lovena Le  . Colitis   . Cardiogenic shock ([place])     a. Initiated on milrinone 09/2014.  . Ischemic colitis ([place]) 09/2014  . CKD (chronic kidney disease), stage IV ([place])   . Transaminitis   . Mild pulmonary hypertension ([place])     a. by [place] 09/2014.   Past Surgical History  Procedure Laterality  Date  . Coronary artery bypass graft    . Left heart catheterization with coronary angiogram N/A 09/09/2013    Procedure: LEFT HEART CATHETERIZATION WITH CORONARY ANGIOGRAM;  Surgeon: Leonie Man, MD;  Location[facility] CATH LAB;  Service: Cardiovascular;  Laterality: N/A;  . Implantable cardioverter defibrillator implant  02-21-2014    STJ single chamber ICD implanted by Dr Lovena Le for primary prevention  . Implantable cardioverter defibrillator implant N/A 02/21/2014    Procedure: IMPLANTABLE CARDIOVERTER DEFIBRILLATOR IMPLANT;  Surgeon: Evans Lance, MD;  Location: [facility] CATH LAB;  Service: Cardiovascular;  Laterality: N/A;  . Left heart catheterization with coronary angiogram N/A 06/13/2014    Procedure: LEFT HEART CATHETERIZATION WITH CORONARY ANGIOGRAM;  Surgeon: Jettie Booze, MD;  Location: [facility] CATH LAB;  Service: Cardiovascular;  Laterality: N/A;  . Cardiac catheterization N/A 10/04/2014    Procedure: Right Heart Cath;  Surgeon: Jolaine Artist, MD;  Location: [place] CV LAB;  Service: Cardiovascular;  Laterality: N/A;   Family History  Problem Relation Age of Onset  . Cancer Mother     bone  . Heart disease Father   . Heart attack Neg Hx   . Stroke Neg Hx   . Diabetes Sister    Social History   Occupational History  . Not on file.   Social History Main Topics  . Smoking status: Former Smoker    Types: Cigarettes    Quit date: 02/25/1998  . Smokeless tobacco: Never Used     Comment: quit many years ago  . Alcohol Use: No  . Drug Use: No  . Sexual Activity: Not on file    Do you feel safe at home?  Yes  Dietary issues and exercise activities: Current Exercise Habits:: The patient does not participate in regular exercise at present  Current Dietary habits:  Patient has changed diet and over the last month she has decrease her intake of carbohydrates.  She is limiting her serving sizes.    Objective:    Today's Vitals   04/07/15 1522  BP: 112/62  Pulse: 68   Height: '5\' 7"'$  (1.702 m)  Weight: 179 lb (81.194 kg)   Body mass index is 28.03 kg/(m^2).  Activities of Daily Living In your present state of health, do you have any difficulty performing the following activities: 04/07/2015 10/02/2014  Hearing? N N  Vision? N N  Difficulty concentrating or making decisions? N N  Walking or climbing stairs? N N  Dressing or bathing? N N  Doing errands, shopping? N Y  Conservation officer, nature and eating ? N -  Using the Toilet? N -  In the past six months, have you accidently leaked urine? N -  Do you have problems with loss of bowel control? N -  Managing your Medications? N -  Managing your Finances? N -  Housekeeping or managing your  Housekeeping? N -    Are there smokers in your home (other than you)? No   Cardiac Risk Factors include: advanced age (>3mn, >>19women);diabetes mellitus;dyslipidemia;hypertension;sedentary lifestyle  Depression Screen PHQ 2/9 Scores 04/07/2015 11/29/2014 03/25/2014 02/11/2014  PHQ - 2 Score 0 0 1 0  PHQ- 9 Score - - 4 3    Fall Risk Fall Risk  04/07/2015 03/25/2014 02/11/2014 01/10/2014 01/10/2014  Falls in the past year? No No No No No    Cognitive Function: MMSE - Mini Mental State Exam 04/07/2015  Orientation to time 5  Orientation to Place 5  Registration 3  Attention/ Calculation 4  Recall 3  Language- name 2 objects 2  Language- repeat 0  Language- follow 3 step command 3  Language- read & follow direction 1  Write a sentence 1  Copy design 0  Total score 27    Immunizations and Health Maintenance Immunization History  Administered Date(s) Administered  . Influenza,inj,Quad PF,36+ Mos 01/10/2014, 11/29/2014  . Pneumococcal Conjugate-13 01/10/2014   Health Maintenance Due  Topic Date Due  . URINE MICROALBUMIN  01/11/2015  . PNA vac Low Risk Adult (2 of 2 - PPSV23) 01/11/2015    Patient Care Team: MChevis Pretty FNP as PCP - General (Nurse Practitioner) YAdelina MingsHMargret Chance MD as Consulting  Physician (Optometry) DJolaine Artist MD as Consulting Physician (Cardiology)  Indicate any recent Medical Services you may have received from other than [facility] providers in the past year (date may be approximate).    Assessment:    Annual Wellness Visit  Type 2 DM - with insulin use CKD stage 4   Screening Tests Health Maintenance  Topic Date Due  . URINE MICROALBUMIN  01/11/2015  . PNA vac Low Risk Adult (2 of 2 - PPSV23) 01/11/2015  . DEXA SCAN  05/30/2015 (Originally 05/08/2009)  . COLONOSCOPY  05/30/2015 (Originally 05/09/1994)  . ZOSTAVAX  05/30/2015 (Originally 05/08/2004)  . COLON CANCER SCREENING ANNUAL FOBT  05/31/2015 (Originally 05/09/1994)  . OPHTHALMOLOGY EXAM  05/12/2015  . HEMOGLOBIN A1C  08/30/2015  . INFLUENZA VACCINE  09/26/2015  . FOOT EXAM  11/29/2015  . MAMMOGRAM  08/03/2016  . TETANUS/TDAP  04/22/2023  . Hepatitis C Screening  Completed        Plan:   During the course of the visit BKatalynawas educated and counseled about the following appropriate screening and preventive services:   Vaccines to include Pneumoccal, Influenza, Hepatitis B, Td, Zostavax - patient declined Zostavax.  It appears she is also due Pneumovax but daughter recalls being told that she was UTD on pneumo vaccines - will request chart and verify.  Checked [facility] and no documentation of Pneumovax.  Colorectal cancer screening - patient refused colonoscopy.  She did agree to FOBT  Cardiovascular disease screening - UTD- seeing cardiologist next week  Diabetes - decrease lantus to 25 units once daily.  Continue glimepiride '2mg'$  qam.  Stop Januvia since has not had in 5 days - if BG begins to increase will restart and get PA as patient is not able to take metformin containing products that her insurance wants her to take.  Bone Denisty / Osteoporosis Screening - patient refused today due to time but will get at next appt  Mammogram -UTD  Glaucoma screening / Diabetic Eye Exam -  UTD  Nutrition counseling - continue to CHO counting  Advanced Directives - information given  Physical Activity - gave handout with chair exercises and try to walk daily if OKd with  cardiologist.   Restart allopurinol '100mg'$  1/2 tablet bid for gout.    Patient Instructions (the written plan) were given to the patient.   Cherre Robins, [facility]   04/07/2015

## 2015-04-11 ENCOUNTER — Ambulatory Visit (INDEPENDENT_AMBULATORY_CARE_PROVIDER_SITE_OTHER): Payer: BLUE CROSS/BLUE SHIELD | Admitting: *Deleted

## 2015-04-11 DIAGNOSIS — I255 Ischemic cardiomyopathy: Secondary | ICD-10-CM | POA: Diagnosis not present

## 2015-04-11 NOTE — Progress Notes (Signed)
Remote ICD transmission.   

## 2015-04-12 ENCOUNTER — Encounter: Payer: Self-pay | Admitting: Cardiology

## 2015-04-12 LAB — CUP PACEART REMOTE DEVICE CHECK
Battery Remaining Longevity: 91 mo
Battery Remaining Percentage: 89 %
Battery Voltage: 3.08 V
Brady Statistic RV Percent Paced: 1 %
Date Time Interrogation Session: 20170214070025
HIGH POWER IMPEDANCE MEASURED VALUE: 65 Ohm
HIGH POWER IMPEDANCE MEASURED VALUE: 65 Ohm
Implantable Lead Model: 7122
Lead Channel Impedance Value: 480 Ohm
Lead Channel Setting Pacing Amplitude: 2.5 V
Lead Channel Setting Pacing Pulse Width: 0.5 ms
Lead Channel Setting Sensing Sensitivity: 0.5 mV
MDC IDC LEAD IMPLANT DT: 20151228
MDC IDC LEAD LOCATION: 753860
MDC IDC MSMT LEADCHNL RV SENSING INTR AMPL: 11.6 mV
MDC IDC PG SERIAL: 7233600

## 2015-04-13 ENCOUNTER — Encounter (HOSPITAL_COMMUNITY): Payer: Self-pay | Admitting: *Deleted

## 2015-04-13 ENCOUNTER — Encounter (HOSPITAL_COMMUNITY): Payer: Self-pay | Admitting: Internal Medicine

## 2015-04-13 ENCOUNTER — Ambulatory Visit (HOSPITAL_COMMUNITY)
Admission: RE | Admit: 2015-04-13 | Discharge: 2015-04-13 | Disposition: A | Discharge status: Home / Self Care | Payer: BLUE CROSS/BLUE SHIELD | Source: Ambulatory Visit | Attending: Internal Medicine | Admitting: Internal Medicine

## 2015-04-13 VITALS — BP 108/62 | HR 79 | Wt 175.0 lb

## 2015-04-13 DIAGNOSIS — I255 Ischemic cardiomyopathy: Secondary | ICD-10-CM | POA: Diagnosis not present

## 2015-04-13 DIAGNOSIS — I2510 Atherosclerotic heart disease of native coronary artery without angina pectoris: Secondary | ICD-10-CM | POA: Insufficient documentation

## 2015-04-13 DIAGNOSIS — E785 Hyperlipidemia, unspecified: Secondary | ICD-10-CM | POA: Diagnosis not present

## 2015-04-13 DIAGNOSIS — I5023 Acute on chronic systolic (congestive) heart failure: Secondary | ICD-10-CM

## 2015-04-13 DIAGNOSIS — I480 Paroxysmal atrial fibrillation: Secondary | ICD-10-CM | POA: Diagnosis not present

## 2015-04-13 DIAGNOSIS — Z833 Family history of diabetes mellitus: Secondary | ICD-10-CM | POA: Insufficient documentation

## 2015-04-13 DIAGNOSIS — Z7982 Long term (current) use of aspirin: Secondary | ICD-10-CM | POA: Diagnosis not present

## 2015-04-13 DIAGNOSIS — I130 Hypertensive heart and chronic kidney disease with heart failure and stage 1 through stage 4 chronic kidney disease, or unspecified chronic kidney disease: Secondary | ICD-10-CM | POA: Diagnosis not present

## 2015-04-13 DIAGNOSIS — E1122 Type 2 diabetes mellitus with diabetic chronic kidney disease: Secondary | ICD-10-CM | POA: Diagnosis not present

## 2015-04-13 DIAGNOSIS — Z7902 Long term (current) use of antithrombotics/antiplatelets: Secondary | ICD-10-CM | POA: Insufficient documentation

## 2015-04-13 DIAGNOSIS — N184 Chronic kidney disease, stage 4 (severe): Secondary | ICD-10-CM | POA: Diagnosis not present

## 2015-04-13 DIAGNOSIS — Z87891 Personal history of nicotine dependence: Secondary | ICD-10-CM | POA: Insufficient documentation

## 2015-04-13 DIAGNOSIS — Z8249 Family history of ischemic heart disease and other diseases of the circulatory system: Secondary | ICD-10-CM | POA: Insufficient documentation

## 2015-04-13 DIAGNOSIS — I5021 Acute systolic (congestive) heart failure: Secondary | ICD-10-CM

## 2015-04-13 DIAGNOSIS — Z79899 Other long term (current) drug therapy: Secondary | ICD-10-CM | POA: Diagnosis not present

## 2015-04-13 DIAGNOSIS — I252 Old myocardial infarction: Secondary | ICD-10-CM | POA: Insufficient documentation

## 2015-04-13 DIAGNOSIS — Z95810 Presence of automatic (implantable) cardiac defibrillator: Secondary | ICD-10-CM | POA: Insufficient documentation

## 2015-04-13 DIAGNOSIS — Z794 Long term (current) use of insulin: Secondary | ICD-10-CM | POA: Diagnosis not present

## 2015-04-13 DIAGNOSIS — I272 Other secondary pulmonary hypertension: Secondary | ICD-10-CM | POA: Diagnosis not present

## 2015-04-13 DIAGNOSIS — Z951 Presence of aortocoronary bypass graft: Secondary | ICD-10-CM | POA: Insufficient documentation

## 2015-04-13 DIAGNOSIS — I5022 Chronic systolic (congestive) heart failure: Secondary | ICD-10-CM | POA: Diagnosis present

## 2015-04-13 DIAGNOSIS — I5042 Chronic combined systolic (congestive) and diastolic (congestive) heart failure: Secondary | ICD-10-CM | POA: Diagnosis not present

## 2015-04-13 LAB — BASIC METABOLIC PANEL
Anion gap: 11 (ref 5–15)
BUN: 50 mg/dL — ABNORMAL HIGH (ref 6–20)
CALCIUM: 9.8 mg/dL (ref 8.9–10.3)
CO2: 23 mmol/L (ref 22–32)
CREATININE: 2.12 mg/dL — AB (ref 0.44–1.00)
Chloride: 107 mmol/L (ref 101–111)
GFR, EST AFRICAN AMERICAN: 26 mL/min — AB (ref >60)
GFR, EST NON AFRICAN AMERICAN: 22 mL/min — AB (ref >60)
Glucose, Bld: 191 mg/dL — ABNORMAL HIGH (ref 65–99)
Potassium: 4.9 mmol/L (ref 3.5–5.1)
Sodium: 141 mmol/L (ref 135–145)

## 2015-04-13 LAB — BRAIN NATRIURETIC PEPTIDE: B Natriuretic Peptide: 525.3 pg/mL — ABNORMAL HIGH (ref 0.0–100.0)

## 2015-04-13 MED ORDER — FUROSEMIDE 40 MG PO TABS: 40.0000 mg | ORAL_TABLET | ORAL | Status: DC

## 2015-04-13 MED ORDER — CARVEDILOL 3.125 MG PO TABS: ORAL_TABLET | ORAL | Status: DC

## 2015-04-13 NOTE — Progress Notes (Signed)
REDS VEST READING= 31  CHEST RULER=11  VEST FITTING TASKS: POSTURE=standing HEIGHT MARKER=T CENTER STRIP=aligned  COMMENTS:approved by sensible

## 2015-04-13 NOTE — Progress Notes (Signed)
Advanced Heart Failure Clinic Note   Patient ID: Jo Lynn, female   DOB: 04-20-1945, 70 y.o.   MRN: [id number] PCP: Jo Lewandowsky, PA-C - [facility] Primary HF Cardiologist: Jo Lynn Primary Cardiologist: Jo Lynn   HPI: [name] is a 70 y/o woman with h/o DM2, HTN, CAD s/p CABG 2004 with inferior STEMI in 7/15 and STEMI 4/16 with ISR of SVG to LCX graft and underwent PTCA with no stent placement., PAF, CKD, chronic systolic HF with EF [id number].   [facility]  09[facility]2014 [facility] with recurrent abdominal pain, marked fatigue, cough, persistent anorexia and orthopnea. Diuresed with IV lasix and required milrinone. Unable to wean milrinone. She remained on milrinone 0.25 mcg. [facility] followed for home milrinone.  Advanced therapies are being considered.  Discharge weight was 179 pounds.   She returns for regular follow up. Increase bidil at last visit. No coreg up-titration with soft BP.  Down 2 lbs since last visit. Saw PCP last month and was started on insulin and Januvia stopped.  Has had some low morning sugars.  Energy level improving. No lightheadedness or dizziness. Weights at home ~168 and stable. No longer having headaches with Bidil, doesn't use tylenol. Activity level about the same, but much easier and more energy per daughter. Keeps up with daughter in the grocery store now, without SOB. No swelling, orthopnea or PND. Taking all medications. Lives at home with daughter.  [place] 10/04/14 RA = 13 RV = 46/8/15 PA = 46/20 (31) PCW = 16 Fick cardiac output/index = 2.7/1.4 PVR = 5.5 WU SVR = 1540  FA sat = 98% PA sat = 36%, 37% Noninvasive BP = 82/57 (65)  ECHO 03/2014 EF 20-25%  ECHO 12/2013 25%   Corevue reviewed today: No VT/VF/AT/AF. Thoracic impedence below threshold but trending up towards dry.   Labs (9/16): K 4.8, creatinine 1.97, HCT 38.3 Labs (9/22): K 5.0, creatinine 1.9  Labs (03/10/15): K 4.8, creatinine 2.54  ROS: All systems  negative except as listed in HPI, PMH and Problem List.  SH:  Social History   Social History  . Marital Status: Single    Spouse Name: N/A  . Number of Children: 4  . Years of Education: N/A   Occupational History  . Not on file.   Social History Main Topics  . Smoking status: Former Smoker    Types: Cigarettes    Quit date: 02/25/1998  . Smokeless tobacco: Never Used     Comment: quit many years ago  . Alcohol Use: No  . Drug Use: No  . Sexual Activity: Not on file   Other Topics Concern  . Not on file   Social History Narrative    FH:  Family History  Problem Relation Age of Onset  . Cancer Mother     bone  . Heart disease Father   . Heart attack Neg Hx   . Stroke Neg Hx   . Diabetes Sister     Past Medical History  Diagnosis Date  . Myocardial infarction ([place]) 10/2002    a. 2004 - MV CAD --> Referred for CABG.  . 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 s/p CABGx4 in 2004 with LIMA-LAD, SVG-RPDA, SVG-OM1-OM2. b. Inferior STEMI 08/2013 s/p BMS to occluded SVG-OM1-OM2, and Unsuccessful attempt at [facility] on proximal 100% SVG-RCA. c. Inferoposterior STEMI 05/2014 s/p Scoring balloon PTCA to dSVG-Cx.  . 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 Lucianne Lei Lynn: LIMA-LAD, SVG-RPDA, SVG-OM1-OM2  .  Cardiomyopathy, ischemic   . Combined systolic and diastolic congestive heart failure, NYHA class 3 ([place])   . Essential hypertension   . Hyperlipidemia with target LDL less than 70   . Diabetes mellitus type 2 with complications ([place])   . 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 Lovena Lynn  . Colitis   . Cardiogenic shock ([place])     a. Initiated on milrinone 09/2014.  . Ischemic colitis ([place]) 09/2014  . CKD (chronic kidney disease), stage IV ([place])   . Transaminitis   . Mild pulmonary hypertension ([place])     a. by [place] 09/2014.    Current Outpatient Prescriptions  Medication Sig Dispense Refill  . allopurinol (ZYLOPRIM) 100 MG tablet Take 0.5 tablets  (50 mg total) by mouth 2 (two) times daily.    Marland Kitchen apixaban (ELIQUIS) 5 MG TABS tablet Take 1 tablet (5 mg total) by mouth 2 (two) times daily. 180 tablet 0  . aspirin EC 81 MG tablet Take 1 tablet (81 mg total) by mouth daily.    Marland Kitchen atorvastatin (LIPITOR) 40 MG tablet Take 1 tablet (40 mg total) by mouth at bedtime. 90 tablet 0  . carvedilol (COREG) 3.125 MG tablet Take 1 tablet (3.125 mg total) by mouth 2 (two) times daily. 60 tablet 3  . digoxin (LANOXIN) 0.125 MG tablet Take 0.5 tablets (0.0625 mg total) by mouth daily. 45 tablet 3  . furosemide (LASIX) 40 MG tablet Take 1 tablet (40 mg total) by mouth every other day. May take an extra 40mg  once a day if weight is 182 pounds or greater. 30 tablet 3  . glimepiride (AMARYL) 2 MG tablet Take 1 tablet (2 mg total) by mouth daily with breakfast. 30 tablet 1  . Insulin Glargine (LANTUS SOLOSTAR) 100 UNIT/ML Solostar Pen Inject up to 30 units once daily 5 pen 1  . Insulin Pen Needle 32G X 4 MM MISC Use with Basaglar insulin pen to inject once daily 100 each 1  . isosorbide-hydrALAZINE (BIDIL) 20-37.5 MG tablet Take 1.5 tablets by mouth 3 (three) times daily. 135 tablet 6  . KLOR-CON M20 20 MEQ tablet Take 1 tablet (20 mEq total) by mouth daily. 30 tablet 5  . spironolactone (ALDACTONE) 25 MG tablet Take 1 tablet (25 mg total) by mouth every other day. 30 tablet 5   No current facility-administered medications for this encounter.    Filed Vitals:   04/13/15 1404  BP: 108/62  Pulse: 79  Weight: 175 lb (79.379 kg)  SpO2: 100%   Wt Readings from Last 3 Encounters:  04/13/15 175 lb (79.379 kg)  04/07/15 179 lb (81.194 kg)  03/06/15 177 lb (80.287 kg)     PHYSICAL EXAM:  General:  Elderly woman. No resp difficulty. Daughter and grandson present  HEENT: normal Neck: supple. JVP 5-6 cm. Carotids 2+ bilaterally; no bruits. No thyromegaly or lymphadenopathy.  Cor: PMI normal. RRR, no MGR Lungs: Clear Abdomen: soft, non-tender, non-distended,  no HSM. No bruits or masses. +BS  Extremities: no cyanosis, clubbing, rash, no edema  Neuro: alert & orientedx3, cranial nerves grossly intact. Moves all 4 extremities w/o difficulty. Affect pleasant.   ASSESSMENT & PLAN: 1. Chronic combined HF: Ischemic cardiomyopathy.  St. Jude ICD.  EF 20-25%, Grade 3 DD Echo 2/16.  NYHA III symptoms. Milrinone d/c'd 11/18/14 - Volume status stable by exam and corvue. ReDs vest reading 31. - Continue lasix 40 mg qod and spironolatone 25 mg qod - No ACEI for now  with elevated creatinine on recent BMETs. Recheck today.  - Continue Bidil 1.5 tab tid. Will anticipate increasing at next visit.  - Increase coreg to 3.125 q am and 6.25 mg at bedtime. Can return to 3.125 mg BID if feeling rundown.  - Continue dig 0.0625 mg. Check digoxin level.  - Can consider corlanor as needed after b-blocker titrated - Encouraged to walk at least 10 minutes daily. 2. CAD s/p CABG 2004; Inferior STEMI 08/2013 s/p BMS to occluded SVG-OM1-OM2, and unsuccessful attempt at [facility] on proximal 100% SVG-RCA; Inferoposterior STEMI 05/2014 s/p scoring balloon PTCA to dSVG-LCx - Continue aspirin, Can stop if needed in the future with Eliquis.  4. CKD Stage IV: BMET today.  5. PAF: Remains in NSR by exam and corvue. Continue Eliquis 5 mg BID. No bleeding problems.   Follow up 6 weeks with Echo.  Med changes as above. Anticipate increasing Bidil at next visit.   Shirley Friar, PA-C 2:26 PM  Patient seen and examined with Oda Kilts, PA-C. We discussed all aspects of the encounter. I agree with the assessment and plan as stated above.   She is doing very well. NYHA II-early III. Much improved. Volume status looks good on exam, Corevue and by ReDS. Will increase nighttime carvedilol as tolerated. Continue Eliquis for AF. No VT r AF on ICD interrogation. F/u in 6 weeks with echo. Titrate Bidil as tolerated next visit.   Neylan Koroma,MD 6:49 PM

## 2015-04-13 NOTE — Patient Instructions (Signed)
Increase Coreg to 3.125mg  in the Am and 6.25 mg in the PM.   Routine lab work today. Will notify you of abnormal results  Follow up in 6 weeks with an Echo.

## 2015-04-14 ENCOUNTER — Telehealth: Payer: Self-pay | Admitting: Pharmacist

## 2015-04-14 NOTE — Telephone Encounter (Signed)
Received patient's old paper chart.  No notation that she received Pneumovax 23.  Recommend she get at next visit.

## 2015-04-23 ENCOUNTER — Other Ambulatory Visit: Payer: Self-pay | Admitting: Nurse Practitioner

## 2015-04-26 ENCOUNTER — Encounter: Payer: Self-pay | Admitting: Internal Medicine

## 2015-05-11 ENCOUNTER — Other Ambulatory Visit: Payer: Self-pay | Admitting: Nurse Practitioner

## 2015-05-11 DIAGNOSIS — E876 Hypokalemia: Secondary | ICD-10-CM

## 2015-05-12 MED ORDER — KLOR-CON M20 20 MEQ PO TBCR: 20.0000 meq | EXTENDED_RELEASE_TABLET | Freq: Every day | ORAL | Status: DC

## 2015-05-12 NOTE — Telephone Encounter (Signed)
Medication refilled. Patient has follow up 06/02/15

## 2015-05-30 ENCOUNTER — Encounter (HOSPITAL_COMMUNITY): Payer: Self-pay | Admitting: Internal Medicine

## 2015-05-30 ENCOUNTER — Ambulatory Visit (HOSPITAL_COMMUNITY)
Admission: RE | Admit: 2015-05-30 | Discharge: 2015-05-30 | Disposition: A | Discharge status: Home / Self Care | Payer: BLUE CROSS/BLUE SHIELD | Source: Ambulatory Visit | Attending: Internal Medicine | Admitting: Internal Medicine

## 2015-05-30 ENCOUNTER — Ambulatory Visit (HOSPITAL_BASED_OUTPATIENT_CLINIC_OR_DEPARTMENT_OTHER)
Admission: RE | Admit: 2015-05-30 | Discharge: 2015-05-30 | Disposition: A | Discharge status: Home / Self Care | Payer: BLUE CROSS/BLUE SHIELD | Source: Ambulatory Visit

## 2015-05-30 ENCOUNTER — Encounter (HOSPITAL_COMMUNITY): Payer: Self-pay | Admitting: *Deleted

## 2015-05-30 VITALS — BP 110/68 | HR 78 | Resp 18 | Wt 182.5 lb

## 2015-05-30 DIAGNOSIS — I252 Old myocardial infarction: Secondary | ICD-10-CM | POA: Diagnosis not present

## 2015-05-30 DIAGNOSIS — Z8249 Family history of ischemic heart disease and other diseases of the circulatory system: Secondary | ICD-10-CM | POA: Insufficient documentation

## 2015-05-30 DIAGNOSIS — Z951 Presence of aortocoronary bypass graft: Secondary | ICD-10-CM | POA: Diagnosis not present

## 2015-05-30 DIAGNOSIS — Z87891 Personal history of nicotine dependence: Secondary | ICD-10-CM | POA: Insufficient documentation

## 2015-05-30 DIAGNOSIS — N184 Chronic kidney disease, stage 4 (severe): Secondary | ICD-10-CM | POA: Diagnosis not present

## 2015-05-30 DIAGNOSIS — I480 Paroxysmal atrial fibrillation: Secondary | ICD-10-CM | POA: Insufficient documentation

## 2015-05-30 DIAGNOSIS — E785 Hyperlipidemia, unspecified: Secondary | ICD-10-CM | POA: Diagnosis not present

## 2015-05-30 DIAGNOSIS — I2510 Atherosclerotic heart disease of native coronary artery without angina pectoris: Secondary | ICD-10-CM | POA: Diagnosis not present

## 2015-05-30 DIAGNOSIS — Z95810 Presence of automatic (implantable) cardiac defibrillator: Secondary | ICD-10-CM | POA: Insufficient documentation

## 2015-05-30 DIAGNOSIS — Z833 Family history of diabetes mellitus: Secondary | ICD-10-CM | POA: Insufficient documentation

## 2015-05-30 DIAGNOSIS — I5022 Chronic systolic (congestive) heart failure: Secondary | ICD-10-CM

## 2015-05-30 DIAGNOSIS — Z794 Long term (current) use of insulin: Secondary | ICD-10-CM | POA: Diagnosis not present

## 2015-05-30 DIAGNOSIS — I5042 Chronic combined systolic (congestive) and diastolic (congestive) heart failure: Secondary | ICD-10-CM | POA: Diagnosis not present

## 2015-05-30 DIAGNOSIS — Z7901 Long term (current) use of anticoagulants: Secondary | ICD-10-CM | POA: Diagnosis not present

## 2015-05-30 DIAGNOSIS — E1122 Type 2 diabetes mellitus with diabetic chronic kidney disease: Secondary | ICD-10-CM | POA: Insufficient documentation

## 2015-05-30 DIAGNOSIS — Z79899 Other long term (current) drug therapy: Secondary | ICD-10-CM | POA: Diagnosis not present

## 2015-05-30 DIAGNOSIS — I5023 Acute on chronic systolic (congestive) heart failure: Secondary | ICD-10-CM

## 2015-05-30 DIAGNOSIS — Z7982 Long term (current) use of aspirin: Secondary | ICD-10-CM | POA: Insufficient documentation

## 2015-05-30 DIAGNOSIS — I130 Hypertensive heart and chronic kidney disease with heart failure and stage 1 through stage 4 chronic kidney disease, or unspecified chronic kidney disease: Secondary | ICD-10-CM | POA: Insufficient documentation

## 2015-05-30 DIAGNOSIS — I272 Other secondary pulmonary hypertension: Secondary | ICD-10-CM | POA: Insufficient documentation

## 2015-05-30 LAB — BASIC METABOLIC PANEL
Anion gap: 11 (ref 5–15)
BUN: 40 mg/dL — ABNORMAL HIGH (ref 6–20)
CHLORIDE: 111 mmol/L (ref 101–111)
CO2: 21 mmol/L — ABNORMAL LOW (ref 22–32)
CREATININE: 1.61 mg/dL — AB (ref 0.44–1.00)
Calcium: 9 mg/dL (ref 8.9–10.3)
GFR, EST AFRICAN AMERICAN: 36 mL/min — AB (ref >60)
GFR, EST NON AFRICAN AMERICAN: 31 mL/min — AB (ref >60)
Glucose, Bld: 90 mg/dL (ref 65–99)
Potassium: 4.1 mmol/L (ref 3.5–5.1)
SODIUM: 143 mmol/L (ref 135–145)

## 2015-05-30 LAB — DIGOXIN LEVEL: DIGOXIN LVL: 0.6 ng/mL — AB (ref 0.8–2.0)

## 2015-05-30 MED ORDER — CARVEDILOL 6.25 MG PO TABS: 6.2500 mg | ORAL_TABLET | Freq: Two times a day (BID) | ORAL | Status: DC

## 2015-05-30 NOTE — Addendum Note (Signed)
Encounter addended by: Scarlette Calico, RN on: 05/30/2015  3:00 PM<BR>     Documentation filed: Dx Association, Patient Instructions Section, Orders

## 2015-05-30 NOTE — Patient Instructions (Signed)
Increase Carvedilol to 6.25 mg Twice daily, we have sent you in a new prescription for 6.25 mg tablets  Labs today  Your physician recommends that you schedule a follow-up appointment in: 2 months

## 2015-05-30 NOTE — Progress Notes (Signed)
  Echocardiogram 2D Echocardiogram has been performed.  Jo Lynn 05/30/2015, 1:47 PM

## 2015-05-30 NOTE — Progress Notes (Addendum)
Patient ID: Jo Lynn, female   DOB: 1945-07-22, 70 y.o.   MRN: [id number]   Advanced Heart Failure Clinic Note   Patient ID: Jo Lynn, female   DOB: [birth year], 70 y.o.   MRN: [id number] PCP: Shelah Lewandowsky, PA-C - [facility] Primary HF Cardiologist: Dr Haroldine Laws Primary Cardiologist: Dr Percival Spanish   HPI: Jo Lynn is a 70 y/o woman with h/o DM2, HTN, CAD s/p CABG 2004 with inferior STEMI in 7/15 and STEMI 4/16 with ISR of SVG to LCX graft and underwent PTCA with no stent placement., PAF, CKD, chronic systolic HF with EF [id number].   Admitted to [facility]  09/2014 [facility] with recurrent abdominal pain, marked fatigue, cough, persistent anorexia and orthopnea. Diuresed with IV lasix and required milrinone. Unable to wean milrinone. She remained on milrinone 0.25 mcg. [facility] followed for home milrinone.  Advanced therapies are being considered.  Discharge weight was 179 pounds.   She returns for regular follow up. We increased carvedilol at last visit to 3.125/6.25. Feels like she is doing excellent. No SOB, edema.No lightheadedness or dizziness. Weight up about 7 pounds. Eating a lot. Appetite good.  Keeps up with daughter in the grocery store now, without SOB. No swelling, orthopnea or PND. Taking all medications. Daughter gives her extra lasix as need for cough. Walking on TM.   Echo today reviewed personally EF 20-25%  RHC 10/04/14 RA = 13 RV = 46/8/15 PA = 46/20 (31) PCW = 16 Fick cardiac output/index = 2.7/1.4 PVR = 5.5 WU SVR = 1540  FA sat = 98% PA sat = 36%, 37% Noninvasive BP = 82/57 (65)  ECHO 03/2014 EF 20-25%  ECHO 12/2013 25%   Corevue reviewed today: No VT/VF/AT/AF. Thoracic impedence below threshold but trending up towards dry.   Labs (9/16): K 4.8, creatinine 1.97, HCT 38.3 Labs (9/22): K 5.0, creatinine 1.9  Labs (03/10/15): K 4.8, creatinine 2.54 Labs: 2/17: K 4.9/creatinine 2.1  ROS: All systems negative except as listed in HPI, PMH  and Problem List.  SH:  Social History   Social History  . Marital Status: Single    Spouse Name: N/A  . Number of Children: 4  . Years of Education: N/A   Occupational History  . Not on file.   Social History Main Topics  . Smoking status: Former Smoker    Types: Cigarettes    Quit date: 02/25/1998  . Smokeless tobacco: Never Used     Comment: quit many years ago  . Alcohol Use: No  . Drug Use: No  . Sexual Activity: Not on file   Other Topics Concern  . Not on file   Social History Narrative    FH:  Family History  Problem Relation Age of Onset  . Cancer Mother     bone  . Heart disease Father   . Heart attack Neg Hx   . Stroke Neg Hx   . Diabetes Sister     Past Medical History  Diagnosis Date  . Myocardial infarction ([place]) 10/2002    a. 2004 - MV CAD --> Referred for CABG.  . 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 s/p CABGx4 in 2004 with LIMA-LAD, SVG-RPDA, SVG-OM1-OM2. b. Inferior STEMI 08/2013 s/p BMS to occluded SVG-OM1-OM2, and Unsuccessful attempt at [facility] on proximal 100% SVG-RCA. c. Inferoposterior STEMI 05/2014 s/p Scoring balloon PTCA to dSVG-Cx.  . 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 Lucianne Lei Trigt: LIMA-LAD, SVG-RPDA, SVG-OM1-OM2  . Cardiomyopathy, ischemic   .  Combined systolic and diastolic congestive heart failure, NYHA class 3 ([place])   . Essential hypertension   . Hyperlipidemia with target LDL less than 70   . Diabetes mellitus type 2 with complications ([place])   . 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 Lovena Le  . Colitis   . Cardiogenic shock ([place])     a. Initiated on milrinone 09/2014.  . Ischemic colitis ([place]) 09/2014  . CKD (chronic kidney disease), stage IV ([place])   . Transaminitis   . Mild pulmonary hypertension ([place])     a. by [place] 09/2014.    Current Outpatient Prescriptions  Medication Sig Dispense Refill  . allopurinol (ZYLOPRIM) 100 MG tablet Take 0.5 tablets (50 mg total) by mouth 2 (two) times  daily.    Marland Kitchen apixaban (ELIQUIS) 5 MG TABS tablet Take 1 tablet (5 mg total) by mouth 2 (two) times daily. 180 tablet 0  . aspirin EC 81 MG tablet Take 1 tablet (81 mg total) by mouth daily.    Marland Kitchen atorvastatin (LIPITOR) 40 MG tablet Take 1 tablet (40 mg total) by mouth at bedtime. 90 tablet 0  . carvedilol (COREG) 3.125 MG tablet Take 1 tablet in the AM and 2 tablets in the PM 90 tablet 3  . digoxin (LANOXIN) 0.125 MG tablet Take 0.5 tablets (0.0625 mg total) by mouth daily. 45 tablet 3  . furosemide (LASIX) 40 MG tablet Take 1 tablet (40 mg total) by mouth every other day. May take an extra 40mg  once a day if weight is 182 pounds or greater. 30 tablet 3  . glimepiride (AMARYL) 2 MG tablet TAKE ONE TABLET BY MOUTH ONCE DAILY WITH BREAKFAST 30 tablet 1  . Insulin Glargine (LANTUS SOLOSTAR) 100 UNIT/ML Solostar Pen Inject up to 30 units once daily 5 pen 1  . Insulin Pen Needle 32G X 4 MM MISC Use with Basaglar insulin pen to inject once daily 100 each 1  . isosorbide-hydrALAZINE (BIDIL) 20-37.5 MG tablet Take 1.5 tablets by mouth 3 (three) times daily. 135 tablet 6  . KLOR-CON M20 20 MEQ tablet Take 1 tablet (20 mEq total) by mouth daily. 30 tablet 0  . spironolactone (ALDACTONE) 25 MG tablet Take 1 tablet (25 mg total) by mouth every other day. 30 tablet 5   No current facility-administered medications for this encounter.    There were no vitals filed for this visit. Wt Readings from Last 3 Encounters:  04/13/15 175 lb (79.379 kg)  04/07/15 179 lb (81.194 kg)  03/06/15 177 lb (80.287 kg)    Filed Vitals:   05/30/15 1434  BP: 110/68  Pulse: 78  Resp: 18    PHYSICAL EXAM:  General:  Elderly woman. No resp difficulty. Daughter and grandson present  HEENT: normal Neck: supple. JVP 6 cm. Carotids 2+ bilaterally; no bruits. No thyromegaly or lymphadenopathy.  Cor: PMI normal. RRR, no MGR Lungs: Clear Abdomen: soft, non-tender, non-distended, no HSM. No bruits or masses. +BS  Extremities:  no cyanosis, clubbing, rash, no edema  Neuro: alert & orientedx3, cranial nerves grossly intact. Moves all 4 extremities w/o difficulty. Affect pleasant.   ASSESSMENT & PLAN: 1. Chronic combined HF: Ischemic cardiomyopathy.  St. Jude ICD.  EF 20-25%, Grade 3 DD Echo today.  NYHA II-III symptoms. Milrinone d/c'd 11/18/14 - Volume status stable by exam despite weight gain.  - Continue lasix 40 mg qod and spironolatone 25 mg qod. Takes extra lasix as needed. Check BMET.  - No ACEI for  now with elevated creatinine on recent BMETs. .  - Continue Bidil 1.5 tab tid. Will anticipate increasing at next visit.  - Increase coreg to 6.25mg  bid - Continue dig 0.0625 mg - Can consider corlanor as needed after b-blocker titrated - Encouraged to walk at least 10 minutes daily. 2. CAD s/p CABG 2004; Inferior STEMI 08/2013 s/p BMS to occluded SVG-OM1-OM2, and unsuccessful attempt at [facility] on proximal 100% SVG-RCA; Inferoposterior STEMI 05/2014 s/p scoring balloon PTCA to dSVG-LCx - Continue aspirin, Can stop if needed in the future with Eliquis.  4. CKD Stage IV: BMET today.  5. PAF: Remains in NSR by exam Continue Eliquis 5 mg BID. No bleeding problems.   Follow-up in 2 months   Truett Mcfarlan,MD 2:34 PM

## 2015-05-30 NOTE — Addendum Note (Signed)
Encounter addended by: Scarlette Calico, RN on: 05/30/2015  3:10 PM<BR>     Documentation filed: Orders

## 2015-06-02 ENCOUNTER — Ambulatory Visit: Payer: Self-pay | Admitting: Nurse Practitioner

## 2015-06-08 ENCOUNTER — Encounter (INDEPENDENT_AMBULATORY_CARE_PROVIDER_SITE_OTHER): Payer: Self-pay

## 2015-06-08 ENCOUNTER — Ambulatory Visit (INDEPENDENT_AMBULATORY_CARE_PROVIDER_SITE_OTHER): Payer: BLUE CROSS/BLUE SHIELD | Admitting: Nurse Practitioner

## 2015-06-08 ENCOUNTER — Encounter: Payer: Self-pay | Admitting: Nurse Practitioner

## 2015-06-08 VITALS — BP 118/68 | HR 91 | Temp 97.7°F | Ht 67.0 in | Wt 183.0 lb

## 2015-06-08 DIAGNOSIS — K529 Noninfective gastroenteritis and colitis, unspecified: Secondary | ICD-10-CM

## 2015-06-08 DIAGNOSIS — I25710 Atherosclerosis of autologous vein coronary artery bypass graft(s) with unstable angina pectoris: Secondary | ICD-10-CM | POA: Diagnosis not present

## 2015-06-08 DIAGNOSIS — I10 Essential (primary) hypertension: Secondary | ICD-10-CM | POA: Diagnosis not present

## 2015-06-08 DIAGNOSIS — Z794 Long term (current) use of insulin: Secondary | ICD-10-CM

## 2015-06-08 DIAGNOSIS — N184 Chronic kidney disease, stage 4 (severe): Secondary | ICD-10-CM

## 2015-06-08 DIAGNOSIS — E1165 Type 2 diabetes mellitus with hyperglycemia: Secondary | ICD-10-CM

## 2015-06-08 DIAGNOSIS — Z9189 Other specified personal risk factors, not elsewhere classified: Secondary | ICD-10-CM

## 2015-06-08 DIAGNOSIS — I5023 Acute on chronic systolic (congestive) heart failure: Secondary | ICD-10-CM | POA: Diagnosis not present

## 2015-06-08 DIAGNOSIS — N179 Acute kidney failure, unspecified: Secondary | ICD-10-CM | POA: Diagnosis not present

## 2015-06-08 DIAGNOSIS — E1122 Type 2 diabetes mellitus with diabetic chronic kidney disease: Secondary | ICD-10-CM

## 2015-06-08 DIAGNOSIS — I213 ST elevation (STEMI) myocardial infarction of unspecified site: Secondary | ICD-10-CM

## 2015-06-08 DIAGNOSIS — IMO0002 Reserved for concepts with insufficient information to code with codable children: Secondary | ICD-10-CM

## 2015-06-08 DIAGNOSIS — I255 Ischemic cardiomyopathy: Secondary | ICD-10-CM | POA: Diagnosis not present

## 2015-06-08 DIAGNOSIS — E876 Hypokalemia: Secondary | ICD-10-CM

## 2015-06-08 DIAGNOSIS — E785 Hyperlipidemia, unspecified: Secondary | ICD-10-CM | POA: Diagnosis not present

## 2015-06-08 DIAGNOSIS — I480 Paroxysmal atrial fibrillation: Secondary | ICD-10-CM | POA: Diagnosis not present

## 2015-06-08 DIAGNOSIS — I5022 Chronic systolic (congestive) heart failure: Secondary | ICD-10-CM

## 2015-06-08 DIAGNOSIS — K219 Gastro-esophageal reflux disease without esophagitis: Secondary | ICD-10-CM

## 2015-06-08 LAB — BAYER DCA HB A1C WAIVED: HB A1C (BAYER DCA - WAIVED): 7.8 % — ABNORMAL HIGH (ref <7.0)

## 2015-06-08 MED ORDER — CARVEDILOL 6.25 MG PO TABS: 6.2500 mg | ORAL_TABLET | Freq: Two times a day (BID) | ORAL | Status: AC

## 2015-06-08 MED ORDER — FUROSEMIDE 40 MG PO TABS: 40.0000 mg | ORAL_TABLET | Freq: Two times a day (BID) | ORAL | Status: AC

## 2015-06-08 MED ORDER — ALLOPURINOL 100 MG PO TABS: 50.0000 mg | ORAL_TABLET | Freq: Two times a day (BID) | ORAL | Status: AC

## 2015-06-08 MED ORDER — SPIRONOLACTONE 25 MG PO TABS: 25.0000 mg | ORAL_TABLET | ORAL | Status: AC

## 2015-06-08 MED ORDER — KLOR-CON M20 20 MEQ PO TBCR: 20.0000 meq | EXTENDED_RELEASE_TABLET | Freq: Every day | ORAL | Status: AC

## 2015-06-08 MED ORDER — ISOSORB DINITRATE-HYDRALAZINE 20-37.5 MG PO TABS: 1.5000 | ORAL_TABLET | Freq: Three times a day (TID) | ORAL | Status: AC

## 2015-06-08 MED ORDER — INSULIN GLARGINE 100 UNIT/ML SOLOSTAR PEN: PEN_INJECTOR | SUBCUTANEOUS | Status: AC

## 2015-06-08 MED ORDER — DIGOXIN 125 MCG PO TABS: 0.0625 mg | ORAL_TABLET | Freq: Every day | ORAL | Status: AC

## 2015-06-08 MED ORDER — GLIMEPIRIDE 2 MG PO TABS: ORAL_TABLET | ORAL | Status: AC

## 2015-06-08 MED ORDER — ATORVASTATIN CALCIUM 40 MG PO TABS: 40.0000 mg | ORAL_TABLET | Freq: Every day | ORAL | Status: AC

## 2015-06-08 MED ORDER — APIXABAN 5 MG PO TABS: 5.0000 mg | ORAL_TABLET | Freq: Two times a day (BID) | ORAL | Status: AC

## 2015-06-08 NOTE — Progress Notes (Signed)
Subjective:    Patient ID: Jo Lynn, female    DOB: [birth year], 70 y.o.   MRN: [id number]   Current Outpatient Prescriptions on File Prior to Visit  Medication Sig Dispense Refill  . allopurinol (ZYLOPRIM) 100 MG tablet Take 0.5 tablets (50 mg total) by mouth 2 (two) times daily.    Marland Kitchen apixaban (ELIQUIS) 5 MG TABS tablet Take 1 tablet (5 mg total) by mouth 2 (two) times daily. 180 tablet 0  . aspirin EC 81 MG tablet Take 1 tablet (81 mg total) by mouth daily.    Marland Kitchen atorvastatin (LIPITOR) 40 MG tablet Take 1 tablet (40 mg total) by mouth at bedtime. 90 tablet 0  . carvedilol (COREG) 6.25 MG tablet Take 1 tablet (6.25 mg total) by mouth 2 (two) times daily with a meal. 60 tablet 6  . digoxin (LANOXIN) 0.125 MG tablet Take 0.5 tablets (0.0625 mg total) by mouth daily. 45 tablet 3  . furosemide (LASIX) 40 MG tablet Take 40 mg by mouth 2 (two) times daily.    Marland Kitchen glimepiride (AMARYL) 2 MG tablet TAKE ONE TABLET BY MOUTH ONCE DAILY WITH BREAKFAST 30 tablet 1  . Insulin Glargine (LANTUS SOLOSTAR) 100 UNIT/ML Solostar Pen Inject up to 30 units once daily 5 pen 1  . Insulin Pen Needle 32G X 4 MM MISC Use with Basaglar insulin pen to inject once daily 100 each 1  . isosorbide-hydrALAZINE (BIDIL) 20-37.5 MG tablet Take 1.5 tablets by mouth 3 (three) times daily. 135 tablet 6  . KLOR-CON M20 20 MEQ tablet Take 1 tablet (20 mEq total) by mouth daily. 30 tablet 0  . spironolactone (ALDACTONE) 25 MG tablet Take 1 tablet (25 mg total) by mouth every other day. 30 tablet 5   No current facility-administered medications on file prior to visit.    Patient here today for follow up of chronic medical problems. She has been in and out of the hospital a lot this year mainly with her heart. She has not been in the hospital since last visit. She has been taken out of work. She continues to feel tired and weak. Has complaints today of leg cramps and dry non-productive cough.    Hypertension This is a chronic  problem. The problem is unchanged. The problem is controlled. Pertinent negatives include no chest pain, headaches, palpitations or shortness of breath. Risk factors for coronary artery disease include stress, dyslipidemia and post-menopausal state. Past treatments include angiotensin blockers, diuretics and beta blockers. The current treatment provides significant improvement. There are no compliance problems.  Hypertensive end-organ damage includes CAD/MI.  Hyperlipidemia This is a chronic problem. The current episode started more than 1 year ago. The problem is uncontrolled. Recent lipid tests were reviewed and are high. Exacerbating diseases include diabetes. She has no history of hypothyroidism or obesity. Pertinent negatives include no chest pain or shortness of breath. Current antihyperlipidemic treatment includes statins. The current treatment provides moderate improvement of lipids. There are no compliance problems.  Risk factors for coronary artery disease include dyslipidemia, family history, hypertension and post-menopausal.  Diabetes She presents for her follow-up diabetic visit. She has type 2 diabetes mellitus. No MedicAlert identification noted. Her disease course has been improving. Pertinent negatives for hypoglycemia include no headaches. Pertinent negatives for diabetes include no chest pain, no polydipsia, no polyphagia, no polyuria, no visual change and no weakness. Symptoms are improving. Diabetic complications include heart disease. Pertinent negatives for diabetic complications include no peripheral neuropathy. Risk factors for coronary artery disease include  stress, obesity, hypertension, dyslipidemia and family history. When asked about current treatments, none were reported. She is compliant with treatment most of the time. Her weight is stable. When asked about meal planning, she reported none. She has not had a previous visit with a dietitian. She rarely participates in exercise.  Her breakfast blood glucose is taken between 8-9 am. Her breakfast blood glucose range is generally 70-90 mg/dl. Her dinner blood glucose is taken between 5-6 pm. Her dinner blood glucose range is generally 140-180 mg/dl. Her overall blood glucose range is 130-140 mg/dl. An ACE inhibitor/angiotensin II receptor blocker is being taken. She sees a podiatrist.Eye exam is not current.  CAD /CHF Cardiologist increased her lopressor- she is doing well but they want to put a defibrillator but [patient told them no.  Patient encouraged to get done. Atrial fib Currently on eliquis- no c/o bleeding- no palpitations     Review of Systems  Constitutional: Negative.   HENT: Negative.   Eyes: Negative.   Respiratory: Negative.  Negative for shortness of breath.   Cardiovascular: Negative for chest pain and palpitations.       Just went to cardiologist and he was pleased with her progress   Gastrointestinal: Negative.   Endocrine: Negative.  Negative for polydipsia, polyphagia and polyuria.  Musculoskeletal: Negative.   Allergic/Immunologic: Negative.   Neurological: Negative.  Negative for weakness and headaches.  Hematological: Negative.   All other systems reviewed and are negative.  Saw a podiatrist less than a year ago Due for eye exam now     Objective:   Physical Exam  Constitutional: She is oriented to person, place, and time. Vital signs are normal. She appears well-developed and well-nourished.  HENT:  Right Ear: External ear normal.  Left Ear: External ear normal.  Nose: Nose normal.  Mouth/Throat: Oropharynx is clear and moist.  Eyes: Conjunctivae and EOM are normal.  Neck: Trachea normal, normal range of motion and full passive range of motion without pain. Neck supple. No JVD present. Carotid bruit is not present. No thyromegaly present.  Cardiovascular: Normal rate, regular rhythm, normal heart sounds and intact distal pulses.  Exam reveals no gallop and no friction rub.   No  murmur heard. Pulmonary/Chest: Effort normal and breath sounds normal.  Abdominal: Soft. Bowel sounds are normal. She exhibits no distension and no mass. There is no tenderness.  Musculoskeletal: Normal range of motion.  Lymphadenopathy:    She has no cervical adenopathy.  Neurological: She is alert and oriented to person, place, and time. She has normal reflexes.  Skin: Skin is warm and dry.  Psychiatric: She has a normal mood and affect. Her behavior is normal. Judgment and thought content normal.   Diabetic Foot Exam - Simple   Simple Foot Form  Diabetic Foot exam was performed with the following findings:  Yes 06/08/2015  3:30 PM  Visual Inspection  No deformities, no ulcerations, no other skin breakdown bilaterally:  Yes  Sensation Testing  Intact to touch and monofilament testing bilaterally:  Yes  Pulse Check  Posterior Tibialis and Dorsalis pulse intact bilaterally:  Yes  Comments      BP 118/68 mmHg  Pulse 91  Temp(Src) 97.7 F (36.5 C) (Oral)  Ht '5\' 7"'$  (1.702 m)  Wt 183 lb (83.008 kg)  BMI 28.66 kg/m2   1. Essential hypertension No salt added to diet - CMP14+EGFR - apixaban (ELIQUIS) 5 MG TABS tablet; Take 1 tablet (5 mg total) by mouth 2 (two) times daily.  Dispense: 180 tablet; Refill: 0 - furosemide (LASIX) 40 MG tablet; Take 1 tablet (40 mg total) by mouth 2 (two) times daily.  Dispense: 30 tablet; Refill: 5  2. Hyperlipidemia with target LDL less than 70 Low fat diet - CMP14+EGFR - Lipid panel - atorvastatin (LIPITOR) 40 MG tablet; Take 1 tablet (40 mg total) by mouth at bedtime.  Dispense: 90 tablet; Refill: 0 - Insulin Glargine (LANTUS SOLOSTAR) 100 UNIT/ML Solostar Pen; Inject up to 30 units once daily  Dispense: 5 pen; Refill: 1  3. Uncontrolled type 2 diabetes mellitus with stage 4 chronic kidney disease, with long-term current use of insulin (HCC) Low carb diet - Bayer DCA Hb A1c Waived - CMP14+EGFR - glimepiride (AMARYL) 2 MG tablet; TAKE ONE  TABLET BY MOUTH ONCE DAILY WITH BREAKFAST  Dispense: 30 tablet; Refill: 5  4. Cardiomyopathy, ischemic - EF ~20-25%; Large MI, existing occluded RCA & SVG-RCA; h/o CABG - carvedilol (COREG) 6.25 MG tablet; Take 1 tablet (6.25 mg total) by mouth 2 (two) times daily with a meal.  Dispense: 60 tablet; Refill: 6 - isosorbide-hydrALAZINE (BIDIL) 20-37.5 MG tablet; Take 1.5 tablets by mouth 3 (three) times daily.  Dispense: 135 tablet; Refill: 6  5. Acute on chronic systolic CHF (congestive heart failure) ([place])  6. ST elevation myocardial infarction (STEMI), unspecified artery (HCC)  7. Paroxysmal atrial fibrillation (HCC) - digoxin (LANOXIN) 0.125 MG tablet; Take 0.5 tablets (0.0625 mg total) by mouth daily.  Dispense: 45 tablet; Refill: 3  8. 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 Colitis  10. Gastroesophageal reflux disease without esophagitis No spicy foods. Do not eat 2 hours before bedtime  11. CKD (chronic kidney disease), stage IV ([place])  12. Acute renal failure superimposed on stage 4 chronic kidney disease ([place])  13. At risk for sudden cardiac death  60. Hypokalemia - KLOR-CON M20 20 MEQ tablet; Take 1 tablet (20 mEq total) by mouth daily.  Dispense: 30 tablet; Refill: 0  15. Chronic systolic CHF (congestive heart failure) (HCC) - spironolactone (ALDACTONE) 25 MG tablet; Take 1 tablet (25 mg total) by mouth every other day.  Dispense: 30 tablet; Refill: 5  Schedule annual eye exam Continue all meds Labs pending Health Maintenance reviewed Diet and exercise encouraged RTO 3 months  Rosalio Loud FNP Student Mary-Margaret Hassell Done, [place]

## 2015-06-08 NOTE — Patient Instructions (Signed)
Health Maintenance, Female Adopting a healthy lifestyle and getting preventive care can go a long way to promote health and wellness. Talk with your health care provider about what schedule of regular examinations is right for you. This is a good chance for you to check in with your provider about disease prevention and staying healthy. In between checkups, there are plenty of things you can do on your own. Experts have done a lot of research about which lifestyle changes and preventive measures are most likely to keep you healthy. Ask your health care provider for more information. WEIGHT AND DIET  Eat a healthy diet  Be sure to include plenty of vegetables, fruits, low-fat dairy products, and lean protein.  Do not eat a lot of foods high in solid fats, added sugars, or salt.  Get regular exercise. This is one of the most important things you can do for your health.  Most adults should exercise for at least 150 minutes each week. The exercise should increase your heart rate and make you sweat (moderate-intensity exercise).  Most adults should also do strengthening exercises at least twice a week. This is in addition to the moderate-intensity exercise.  Maintain a healthy weight  Body mass index (BMI) is a measurement that can be used to identify possible weight problems. It estimates body fat based on height and weight. Your health care provider can help determine your BMI and help you achieve or maintain a healthy weight.  For females 20 years of age and older:   A BMI below 18.5 is considered underweight.  A BMI of 18.5 to 24.9 is normal.  A BMI of 25 to 29.9 is considered overweight.  A BMI of 30 and above is considered obese.  Watch levels of cholesterol and blood lipids  You should start having your blood tested for lipids and cholesterol at 71 years of age, then have this test every 5 years.  You may need to have your cholesterol levels checked more often if:  Your lipid  or cholesterol levels are high.  You are older than 71 years of age.  You are at high risk for heart disease.  CANCER SCREENING   Lung Cancer  Lung cancer screening is recommended for adults 55-80 years old who are at high risk for lung cancer because of a history of smoking.  A yearly low-dose CT scan of the lungs is recommended for people who:  Currently smoke.  Have quit within the past 15 years.  Have at least a 30-pack-year history of smoking. A pack year is smoking an average of one pack of cigarettes a day for 1 year.  Yearly screening should continue until it has been 15 years since you quit.  Yearly screening should stop if you develop a health problem that would prevent you from having lung cancer treatment.  Breast Cancer  Practice breast self-awareness. This means understanding how your breasts normally appear and feel.  It also means doing regular breast self-exams. Let your health care provider know about any changes, no matter how small.  If you are in your 20s or 30s, you should have a clinical breast exam (CBE) by a health care provider every 1-3 years as part of a regular health exam.  If you are 40 or older, have a CBE every year. Also consider having a breast X-ray (mammogram) every year.  If you have a family history of breast cancer, talk to your health care provider about genetic screening.  If you   are at high risk for breast cancer, talk to your health care provider about having an MRI and a mammogram every year.  Breast cancer gene (BRCA) assessment is recommended for women who have family members with BRCA-related cancers. BRCA-related cancers include:  Breast.  Ovarian.  Tubal.  Peritoneal cancers.  Results of the assessment will determine the need for genetic counseling and BRCA1 and BRCA2 testing. Cervical Cancer Your health care provider may recommend that you be screened regularly for cancer of the pelvic organs (ovaries, uterus, and  vagina). This screening involves a pelvic examination, including checking for microscopic changes to the surface of your cervix (Pap test). You may be encouraged to have this screening done every 3 years, beginning at age 21.  For women ages 30-65, health care providers may recommend pelvic exams and Pap testing every 3 years, or they may recommend the Pap and pelvic exam, combined with testing for human papilloma virus (HPV), every 5 years. Some types of HPV increase your risk of cervical cancer. Testing for HPV may also be done on women of any age with unclear Pap test results.  Other health care providers may not recommend any screening for nonpregnant women who are considered low risk for pelvic cancer and who do not have symptoms. Ask your health care provider if a screening pelvic exam is right for you.  If you have had past treatment for cervical cancer or a condition that could lead to cancer, you need Pap tests and screening for cancer for at least 20 years after your treatment. If Pap tests have been discontinued, your risk factors (such as having a new sexual partner) need to be reassessed to determine if screening should resume. Some women have medical problems that increase the chance of getting cervical cancer. In these cases, your health care provider may recommend more frequent screening and Pap tests. Colorectal Cancer  This type of cancer can be detected and often prevented.  Routine colorectal cancer screening usually begins at 71 years of age and continues through 71 years of age.  Your health care provider may recommend screening at an earlier age if you have risk factors for colon cancer.  Your health care provider may also recommend using home test kits to check for hidden blood in the stool.  A small camera at the end of a tube can be used to examine your colon directly (sigmoidoscopy or colonoscopy). This is done to check for the earliest forms of colorectal  cancer.  Routine screening usually begins at age 50.  Direct examination of the colon should be repeated every 5-10 years through 71 years of age. However, you may need to be screened more often if early forms of precancerous polyps or small growths are found. Skin Cancer  Check your skin from head to toe regularly.  Tell your health care provider about any new moles or changes in moles, especially if there is a change in a mole's shape or color.  Also tell your health care provider if you have a mole that is larger than the size of a pencil eraser.  Always use sunscreen. Apply sunscreen liberally and repeatedly throughout the day.  Protect yourself by wearing long sleeves, pants, a wide-brimmed hat, and sunglasses whenever you are outside. HEART DISEASE, DIABETES, AND HIGH BLOOD PRESSURE   High blood pressure causes heart disease and increases the risk of stroke. High blood pressure is more likely to develop in:  People who have blood pressure in the high end   of the normal range (130-139/85-89 mm Hg).  People who are overweight or obese.  People who are African American.  If you are 38-23 years of age, have your blood pressure checked every 3-5 years. If you are 61 years of age or older, have your blood pressure checked every year. You should have your blood pressure measured twice--once when you are at a hospital or clinic, and once when you are not at a hospital or clinic. Record the average of the two measurements. To check your blood pressure when you are not at a hospital or clinic, you can use:  An automated blood pressure machine at a pharmacy.  A home blood pressure monitor.  If you are between 45 years and 39 years old, ask your health care provider if you should take aspirin to prevent strokes.  Have regular diabetes screenings. This involves taking a blood sample to check your fasting blood sugar level.  If you are at a normal weight and have a low risk for diabetes,  have this test once every three years after 71 years of age.  If you are overweight and have a high risk for diabetes, consider being tested at a younger age or more often. PREVENTING INFECTION  Hepatitis B  If you have a higher risk for hepatitis B, you should be screened for this virus. You are considered at high risk for hepatitis B if:  You were born in a country where hepatitis B is common. Ask your health care provider which countries are considered high risk.  Your parents were born in a high-risk country, and you have not been immunized against hepatitis B (hepatitis B vaccine).  You have HIV or AIDS.  You use needles to inject street drugs.  You live with someone who has hepatitis B.  You have had sex with someone who has hepatitis B.  You get hemodialysis treatment.  You take certain medicines for conditions, including cancer, organ transplantation, and autoimmune conditions. Hepatitis C  Blood testing is recommended for:  Everyone born from [birth year] through 1966.  Anyone with known risk factors for hepatitis C. Sexually transmitted infections (STIs)  You should be screened for sexually transmitted infections (STIs) including gonorrhea and chlamydia if:  You are sexually active and are younger than 71 years of age.  You are older than 71 years of age and your health care provider tells you that you are at risk for this type of infection.  Your sexual activity has changed since you were last screened and you are at an increased risk for chlamydia or gonorrhea. Ask your health care provider if you are at risk.  If you do not have HIV, but are at risk, it may be recommended that you take a prescription medicine daily to prevent HIV infection. This is called pre-exposure prophylaxis (PrEP). You are considered at risk if:  You are sexually active and do not regularly use condoms or know the HIV status of your partner(s).  You take drugs by injection.  You are sexually  active with a partner who has HIV. Talk with your health care provider about whether you are at high risk of being infected with HIV. If you choose to begin PrEP, you should first be tested for HIV. You should then be tested every 3 months for as long as you are taking PrEP.  PREGNANCY   If you are premenopausal and you may become pregnant, ask your health care provider about preconception counseling.  If you may  become pregnant, take 400 to 800 micrograms (mcg) of folic acid every day.  If you want to prevent pregnancy, talk to your health care provider about birth control (contraception). OSTEOPOROSIS AND MENOPAUSE   Osteoporosis is a disease in which the bones lose minerals and strength with aging. This can result in serious bone fractures. Your risk for osteoporosis can be identified using a bone density scan.  If you are 61 years of age or older, or if you are at risk for osteoporosis and fractures, ask your health care provider if you should be screened.  Ask your health care provider whether you should take a calcium or vitamin D supplement to lower your risk for osteoporosis.  Menopause may have certain physical symptoms and risks.  Hormone replacement therapy may reduce some of these symptoms and risks. Talk to your health care provider about whether hormone replacement therapy is right for you.  HOME CARE INSTRUCTIONS   Schedule regular health, dental, and eye exams.  Stay current with your immunizations.   Do not use any tobacco products including cigarettes, chewing tobacco, or electronic cigarettes.  If you are pregnant, do not drink alcohol.  If you are breastfeeding, limit how much and how often you drink alcohol.  Limit alcohol intake to no more than 1 drink per day for nonpregnant women. One drink equals 12 ounces of beer, 5 ounces of wine, or 1 ounces of hard liquor.  Do not use street drugs.  Do not share needles.  Ask your health care provider for help if  you need support or information about quitting drugs.  Tell your health care provider if you often feel depressed.  Tell your health care provider if you have ever been abused or do not feel safe at home.   This information is not intended to replace advice given to you by your health care provider. Make sure you discuss any questions you have with your health care provider.   Document Released: 08/27/2010 Document Revised: 03/04/2014 Document Reviewed: 01/13/2013 Elsevier Interactive Patient Education [employer].

## 2015-06-09 LAB — CMP14+EGFR
ALT: 27 IU/L (ref 0–32)
AST: 19 IU/L (ref 0–40)
Albumin/Globulin Ratio: 1.4 (ref 1.2–2.2)
Albumin: 4.2 g/dL (ref 3.5–4.8)
Alkaline Phosphatase: 104 IU/L (ref 39–117)
BILIRUBIN TOTAL: 0.5 mg/dL (ref 0.0–1.2)
BUN/Creatinine Ratio: 27 (ref 12–28)
BUN: 50 mg/dL — AB (ref 8–27)
CALCIUM: 8.9 mg/dL (ref 8.7–10.3)
CO2: 19 mmol/L (ref 18–29)
CREATININE: 1.84 mg/dL — AB (ref 0.57–1.00)
Chloride: 105 mmol/L (ref 96–106)
GFR, EST AFRICAN AMERICAN: 31 mL/min/{1.73_m2} — AB (ref >59)
GFR, EST NON AFRICAN AMERICAN: 27 mL/min/{1.73_m2} — AB (ref >59)
GLUCOSE: 148 mg/dL — AB (ref 65–99)
Globulin, Total: 3 g/dL (ref 1.5–4.5)
Potassium: 4.6 mmol/L (ref 3.5–5.2)
Sodium: 142 mmol/L (ref 134–144)
TOTAL PROTEIN: 7.2 g/dL (ref 6.0–8.5)

## 2015-06-09 LAB — LIPID PANEL
CHOL/HDL RATIO: 7.1 ratio — AB (ref 0.0–4.4)
Cholesterol, Total: 228 mg/dL — ABNORMAL HIGH (ref 100–199)
HDL: 32 mg/dL — ABNORMAL LOW (ref >39)
LDL CALC: 170 mg/dL — AB (ref 0–99)
Triglycerides: 130 mg/dL (ref 0–149)
VLDL CHOLESTEROL CAL: 26 mg/dL (ref 5–40)

## 2015-06-13 ENCOUNTER — Telehealth: Payer: Self-pay

## 2015-06-13 NOTE — Telephone Encounter (Signed)
Insurance prior authorized [employer], [employer]

## 2015-06-29 ENCOUNTER — Other Ambulatory Visit: Payer: Self-pay | Admitting: Pharmacist

## 2015-07-11 ENCOUNTER — Ambulatory Visit (INDEPENDENT_AMBULATORY_CARE_PROVIDER_SITE_OTHER): Payer: BLUE CROSS/BLUE SHIELD | Admitting: *Deleted

## 2015-07-11 ENCOUNTER — Telehealth: Payer: Self-pay | Admitting: Cardiology

## 2015-07-11 DIAGNOSIS — I255 Ischemic cardiomyopathy: Secondary | ICD-10-CM | POA: Diagnosis not present

## 2015-07-11 NOTE — Telephone Encounter (Signed)
LMOVM reminding pt to send remote transmission.   

## 2015-07-12 NOTE — Progress Notes (Signed)
Remote ICD transmission.   

## 2015-07-16 ENCOUNTER — Inpatient Hospital Stay (HOSPITAL_COMMUNITY): Payer: BLUE CROSS/BLUE SHIELD

## 2015-07-16 ENCOUNTER — Emergency Department (HOSPITAL_COMMUNITY): Payer: BLUE CROSS/BLUE SHIELD

## 2015-07-16 ENCOUNTER — Observation Stay (HOSPITAL_COMMUNITY)
Admission: EM | Admit: 2015-07-16 | Discharge: 2015-07-16 | Disposition: A | Discharge status: Home / Self Care | Payer: BLUE CROSS/BLUE SHIELD | Attending: Obstetrics and Gynecology | Admitting: Obstetrics and Gynecology

## 2015-07-16 ENCOUNTER — Encounter (HOSPITAL_COMMUNITY): Payer: Self-pay | Admitting: Adult Health

## 2015-07-16 DIAGNOSIS — Z7984 Long term (current) use of oral hypoglycemic drugs: Secondary | ICD-10-CM | POA: Insufficient documentation

## 2015-07-16 DIAGNOSIS — I129 Hypertensive chronic kidney disease with stage 1 through stage 4 chronic kidney disease, or unspecified chronic kidney disease: Secondary | ICD-10-CM

## 2015-07-16 DIAGNOSIS — Z79899 Other long term (current) drug therapy: Secondary | ICD-10-CM | POA: Diagnosis not present

## 2015-07-16 DIAGNOSIS — N939 Abnormal uterine and vaginal bleeding, unspecified: Secondary | ICD-10-CM | POA: Diagnosis present

## 2015-07-16 DIAGNOSIS — Z951 Presence of aortocoronary bypass graft: Secondary | ICD-10-CM

## 2015-07-16 DIAGNOSIS — D649 Anemia, unspecified: Secondary | ICD-10-CM | POA: Insufficient documentation

## 2015-07-16 DIAGNOSIS — Z7901 Long term (current) use of anticoagulants: Secondary | ICD-10-CM | POA: Insufficient documentation

## 2015-07-16 DIAGNOSIS — R05 Cough: Secondary | ICD-10-CM

## 2015-07-16 DIAGNOSIS — I5040 Unspecified combined systolic (congestive) and diastolic (congestive) heart failure: Secondary | ICD-10-CM | POA: Diagnosis not present

## 2015-07-16 DIAGNOSIS — D251 Intramural leiomyoma of uterus: Secondary | ICD-10-CM | POA: Insufficient documentation

## 2015-07-16 DIAGNOSIS — I272 Other secondary pulmonary hypertension: Secondary | ICD-10-CM | POA: Insufficient documentation

## 2015-07-16 DIAGNOSIS — N184 Chronic kidney disease, stage 4 (severe): Secondary | ICD-10-CM | POA: Insufficient documentation

## 2015-07-16 DIAGNOSIS — I2510 Atherosclerotic heart disease of native coronary artery without angina pectoris: Secondary | ICD-10-CM

## 2015-07-16 DIAGNOSIS — I252 Old myocardial infarction: Secondary | ICD-10-CM

## 2015-07-16 DIAGNOSIS — Z794 Long term (current) use of insulin: Secondary | ICD-10-CM | POA: Insufficient documentation

## 2015-07-16 DIAGNOSIS — N950 Postmenopausal bleeding: Secondary | ICD-10-CM | POA: Diagnosis present

## 2015-07-16 DIAGNOSIS — E119 Type 2 diabetes mellitus without complications: Secondary | ICD-10-CM | POA: Insufficient documentation

## 2015-07-16 DIAGNOSIS — R1032 Left lower quadrant pain: Secondary | ICD-10-CM | POA: Insufficient documentation

## 2015-07-16 DIAGNOSIS — Z87891 Personal history of nicotine dependence: Secondary | ICD-10-CM | POA: Insufficient documentation

## 2015-07-16 LAB — URINE MICROSCOPIC-ADD ON

## 2015-07-16 LAB — URINALYSIS, ROUTINE W REFLEX MICROSCOPIC
Bilirubin Urine: NEGATIVE
GLUCOSE, UA: NEGATIVE mg/dL
KETONES UR: NEGATIVE mg/dL
Nitrite: NEGATIVE
PROTEIN: NEGATIVE mg/dL
Specific Gravity, Urine: 1.01 (ref 1.005–1.030)
pH: 5 (ref 5.0–8.0)

## 2015-07-16 LAB — CBC WITH DIFFERENTIAL/PLATELET
BASOS ABS: 0 10*3/uL (ref 0.0–0.1)
BASOS PCT: 0 %
EOS PCT: 1 %
Eosinophils Absolute: 0.1 10*3/uL (ref 0.0–0.7)
HCT: 26.5 % — ABNORMAL LOW (ref 36.0–46.0)
Hemoglobin: 8.3 g/dL — ABNORMAL LOW (ref 12.0–15.0)
LYMPHS PCT: 12 %
Lymphs Abs: 1.4 10*3/uL (ref 0.7–4.0)
MCH: 28.6 pg (ref 26.0–34.0)
MCHC: 31.3 g/dL (ref 30.0–36.0)
MCV: 91.4 fL (ref 78.0–100.0)
MONO ABS: 1 10*3/uL (ref 0.1–1.0)
Monocytes Relative: 8 %
NEUTROS ABS: 9.4 10*3/uL — AB (ref 1.7–7.7)
Neutrophils Relative %: 79 %
PLATELETS: 177 10*3/uL (ref 150–400)
RBC: 2.9 MIL/uL — AB (ref 3.87–5.11)
RDW: 14.9 % (ref 11.5–15.5)
WBC: 11.8 10*3/uL — AB (ref 4.0–10.5)

## 2015-07-16 LAB — BASIC METABOLIC PANEL
ANION GAP: 10 (ref 5–15)
BUN: 42 mg/dL — ABNORMAL HIGH (ref 6–20)
CALCIUM: 9.3 mg/dL (ref 8.9–10.3)
CO2: 22 mmol/L (ref 22–32)
Chloride: 105 mmol/L (ref 101–111)
Creatinine, Ser: 1.99 mg/dL — ABNORMAL HIGH (ref 0.44–1.00)
GFR, EST AFRICAN AMERICAN: 28 mL/min — AB (ref >60)
GFR, EST NON AFRICAN AMERICAN: 24 mL/min — AB (ref >60)
Glucose, Bld: 345 mg/dL — ABNORMAL HIGH (ref 65–99)
POTASSIUM: 4.5 mmol/L (ref 3.5–5.1)
SODIUM: 137 mmol/L (ref 135–145)

## 2015-07-16 LAB — SAMPLE TO BLOOD BANK

## 2015-07-16 MED ORDER — CARVEDILOL 6.25 MG PO TABS
6.2500 mg | ORAL_TABLET | Freq: Two times a day (BID) | ORAL | Status: DC
  Administered 2015-07-16: 15:00:00 via ORAL
  Filled 2015-07-16 (×2): qty 1

## 2015-07-16 MED ORDER — FUROSEMIDE 40 MG PO TABS
40.0000 mg | ORAL_TABLET | Freq: Two times a day (BID) | ORAL | Status: DC
  Administered 2015-07-16: 40 mg via ORAL
  Filled 2015-07-16: qty 1
  Filled 2015-07-16: qty 2

## 2015-07-16 MED ORDER — DIGOXIN 0.0625 MG HALF TABLET
0.0625 mg | ORAL_TABLET | Freq: Every day | ORAL | Status: DC
  Administered 2015-07-16: 0.0625 mg via ORAL
  Filled 2015-07-16 (×2): qty 1

## 2015-07-16 MED ORDER — MEGESTROL ACETATE 40 MG PO TABS: 40.0000 mg | ORAL_TABLET | Freq: Every day | ORAL | Status: AC

## 2015-07-16 MED ORDER — GLIMEPIRIDE 2 MG PO TABS
2.0000 mg | ORAL_TABLET | Freq: Every day | ORAL | Status: DC
  Administered 2015-07-16: 2 mg via ORAL
  Filled 2015-07-16 (×2): qty 1

## 2015-07-16 MED ORDER — MEGESTROL ACETATE 40 MG PO TABS
120.0000 mg | ORAL_TABLET | Freq: Every day | ORAL | Status: DC
  Administered 2015-07-16: 120 mg via ORAL
  Filled 2015-07-16 (×2): qty 3

## 2015-07-16 MED ORDER — ISOSORB DINITRATE-HYDRALAZINE 20-37.5 MG PO TABS
1.5000 | ORAL_TABLET | Freq: Three times a day (TID) | ORAL | Status: DC
  Administered 2015-07-16: 1.5 via ORAL
  Filled 2015-07-16 (×3): qty 1.5

## 2015-07-16 MED ORDER — ATORVASTATIN CALCIUM 40 MG PO TABS
40.0000 mg | ORAL_TABLET | Freq: Every day | ORAL | Status: DC
Start: 2015-07-16 — End: 2015-07-16
  Administered 2015-07-16: 40 mg via ORAL
  Filled 2015-07-16: qty 1

## 2015-07-16 MED ORDER — POTASSIUM CHLORIDE CRYS ER 20 MEQ PO TBCR
20.0000 meq | EXTENDED_RELEASE_TABLET | Freq: Every day | ORAL | Status: DC
  Administered 2015-07-16: 20 meq via ORAL
  Filled 2015-07-16 (×2): qty 1

## 2015-07-16 MED ORDER — SPIRONOLACTONE 25 MG PO TABS
25.0000 mg | ORAL_TABLET | ORAL | Status: DC
  Administered 2015-07-16: 25 mg via ORAL
  Filled 2015-07-16: qty 1

## 2015-07-16 NOTE — ED Notes (Signed)
Presents with cough for one week, this weekend noticed blood clots coming out of vagina when coughing and pink tinged urine. She believes the insulin shots are to blame for the vaginal bleeding. Denies pain. Reports mainly blood clots when standing large blood clots this evening.

## 2015-07-16 NOTE — Discharge Instructions (Signed)
Hysteroscopy Hysteroscopy is a procedure used for looking inside the womb (uterus). It may be done for various reasons, including:  To evaluate abnormal bleeding, fibroid (benign, noncancerous) tumors, polyps, scar tissue (adhesions), and possibly cancer of the uterus.  To look for lumps (tumors) and other uterine growths.  To look for causes of why a woman cannot get pregnant (infertility), causes of recurrent loss of pregnancy (miscarriages), or a lost intrauterine device (IUD).  To perform a sterilization by blocking the fallopian tubes from inside the uterus. In this procedure, a thin, flexible tube with a tiny light and camera on the end of it (hysteroscope) is used to look inside the uterus. A hysteroscopy should be done right after a menstrual period to be sure you are not pregnant. LET [facility] CARE PROVIDER KNOW ABOUT:   Any allergies you have.  All medicines you are taking, including vitamins, herbs, eye drops, creams, and over-the-counter medicines.  Previous problems you or members of your family have had with the use of anesthetics.  Any blood disorders you have.  Previous surgeries you have had.  Medical conditions you have. RISKS AND COMPLICATIONS  Generally, this is a safe procedure. However, as with any procedure, complications can occur. Possible complications include:  Putting a hole in the uterus.  Excessive bleeding.  Infection.  Damage to the cervix.  Injury to other organs.  Allergic reaction to medicines.  Too much fluid used in the uterus for the procedure. BEFORE THE PROCEDURE   Ask your health care provider about changing or stopping any regular medicines.  Do not take aspirin or blood thinners for 1 week before the procedure, or as directed by your health care provider. These can cause bleeding.  If you smoke, do not smoke for 2 weeks before the procedure.  In some cases, a medicine is placed in the cervix the day before the procedure.  This medicine makes the cervix have a larger opening (dilate). This makes it easier for the instrument to be inserted into the uterus during the procedure.  Do not eat or drink anything for at least 8 hours before the surgery.  Arrange for someone to take you home after the procedure. PROCEDURE   You may be given a medicine to relax you (sedative). You may also be given one of the following:  A medicine that numbs the area around the cervix (local anesthetic).  A medicine that makes you sleep through the procedure (general anesthetic).  The hysteroscope is inserted through the vagina into the uterus. The camera on the hysteroscope sends a picture to a TV screen. This gives the surgeon a good view inside the uterus.  During the procedure, air or a liquid is put into the uterus, which allows the surgeon to see better.  Sometimes, tissue is gently scraped from inside the uterus. These tissue samples are sent to a lab for testing. AFTER THE PROCEDURE   If you had a general anesthetic, you may be groggy for a couple hours after the procedure.  If you had a local anesthetic, you will be able to go home as soon as you are stable and feel ready.  You may have some cramping. This normally lasts for a couple days.  You may have bleeding, which varies from light spotting for a few days to menstrual-like bleeding for 3-7 days. This is normal.  If your test results are not back during the visit, make an appointment with your health care provider to find out the  results.   This information is not intended to replace advice given to you by your health care provider. Make sure you discuss any questions you have with your health care provider.   Document Released: 05/20/2000 Document Revised: 12/02/2012 Document Reviewed: 09/10/2012 Elsevier Interactive Patient Education [employer].

## 2015-07-16 NOTE — Discharge Summary (Signed)
Physician Discharge Summary  Patient ID: Jo Lynn MRN: [id number] DOB/AGE: August 26, 1945 70 y.o.  Admit date: 07/16/2015 Discharge date: 07/16/2015  Admission Diagnoses:Postmenopausal bleeding History of type 2 diabetes coronary artery disease systolic and diastolic CHF class III, chronic kidney disease Discharge Diagnoses:  Principal Problem:   Postmenopausal vaginal bleeding  Otherwise unchanged Discharged Condition: good  Hospital Course: This 70 year old female was seen in [facility] emergency room for episode of postmenopausal bleeding with left lower quadrant pain exam showed the bleeding to be coming from the vagina from and speculum showed it to be from the cervix. She has a large fibroid uterus almost 20 weeks size which has remained stable adnexal masses other than the fibroid enlargement. Medical history is complicated as listed above. At the time of initial exam she is bleeding sufficiently that she was felt to need further workup and evaluation and she was transferred to [facility] for GYN evaluation. The day she had a solution of her bleeding. She received Megace 120 mg orally upon arrival. He had a transvaginal ultrasound performed which further delineate that in addition to an enlarged fibroid uterus she had an area of firm tissue in the endometrial cavity that appeared to be either a huge heterogeneous mass or a small mass with surrounding blood clots which is considered more likely. Since she is otherwise stable at this time plans are for discharge home for outpatient follow-up and tended to 14 days for repeat ultrasound to further delineate the fibroid, the endometrial polyp size it is. Listed to determine whether hysteroscopy would allow [place] to remove the mass and resolve the bleeding which would require preoperative management of her anticoagulation  Consults: gynecology  Significant Diagnostic Studies: labs:  CBC    Component Value Date/Time   WBC 11.8* 07/16/2015  0900   RBC 2.90* 07/16/2015 0900   HGB 8.3* 07/16/2015 0900   HCT 26.5* 07/16/2015 0900   PLT 177 07/16/2015 0900   MCV 91.4 07/16/2015 0900   MCH 28.6 07/16/2015 0900   MCHC 31.3 07/16/2015 0900   RDW 14.9 07/16/2015 0900   LYMPHSABS 1.4 07/16/2015 0900   MONOABS 1.0 07/16/2015 0900   EOSABS 0.1 07/16/2015 0900   BASOSABS 0.0 07/16/2015 0900      Treatments: Transvaginal ultrasound C report  Discharge Exam: Blood pressure 107/43, pulse 98, temperature 99.8 F (37.7 C), temperature source Oral, resp. rate 18, height 5\' 7"  (1.702 m), weight 83.915 kg (185 lb), SpO2 99 %. General appearance: alert, cooperative and appears stated age Head: Normocephalic, without obvious abnormality, atraumatic GI: soft, non-tender; bowel sounds normal; no masses,  no organomegaly and Firm fibroid uterus almost umbilicus Pelvic: external genitalia normal and Currently bleeding has resolved Extremities: extremities normal, atraumatic, no cyanosis or edema  Disposition: 01-Home or Self Care  Discharge Instructions    Call MD for:  severe uncontrolled pain    Complete by:  As directed      Call MD for:    Complete by:  As directed   Please notify our office for any heavy clot passage  Please call [facility] [phone] for follow-up appointment in 10-12 days for ultrasound and follow-up     Diet - low sodium heart healthy    Complete by:  As directed      Increase activity slowly    Complete by:  As directed             Medication List    TAKE these medications  allopurinol 100 MG tablet  Commonly known as:  ZYLOPRIM  Take 0.5 tablets (50 mg total) by mouth 2 (two) times daily.     apixaban 5 MG Tabs tablet  Commonly known as:  ELIQUIS  Take 1 tablet (5 mg total) by mouth 2 (two) times daily.     aspirin EC 81 MG tablet  Take 1 tablet (81 mg total) by mouth daily.     atorvastatin 40 MG tablet  Commonly known as:  LIPITOR  Take 1 tablet (40 mg total) by mouth at  bedtime.     carvedilol 6.25 MG tablet  Commonly known as:  COREG  Take 1 tablet (6.25 mg total) by mouth 2 (two) times daily with a meal.     digoxin 0.125 MG tablet  Commonly known as:  LANOXIN  Take 0.5 tablets (0.0625 mg total) by mouth daily.     furosemide 40 MG tablet  Commonly known as:  LASIX  Take 1 tablet (40 mg total) by mouth 2 (two) times daily.     glimepiride 2 MG tablet  Commonly known as:  AMARYL  TAKE ONE TABLET BY MOUTH ONCE DAILY WITH BREAKFAST     Insulin Glargine 100 UNIT/ML Solostar Pen  Commonly known as:  LANTUS SOLOSTAR  Inject up to 30 units once daily     LANTUS SOLOSTAR 100 UNIT/ML Solostar Pen  Generic drug:  Insulin Glargine  INJECT UP TO 20 UNITS ONCE DAILY     Insulin Pen Needle 32G X 4 MM Misc  Use with Basaglar insulin pen to inject once daily     isosorbide-hydrALAZINE 20-37.5 MG tablet  Commonly known as:  BIDIL  Take 1.5 tablets by mouth 3 (three) times daily.     KLOR-CON M20 20 MEQ tablet  Generic drug:  potassium chloride SA  Take 1 tablet (20 mEq total) by mouth daily.     megestrol 40 MG tablet  Commonly known as:  MEGACE  Take 1 tablet (40 mg total) by mouth daily.     spironolactone 25 MG tablet  Commonly known as:  ALDACTONE  Take 1 tablet (25 mg total) by mouth every other day.           Follow-up Information    Follow up with Jonnie Kind, MD In 2 weeks.   Specialties:  Obstetrics and Gynecology, Radiology   Why:  For an ultrasound and repeat examination with Pap smear   Contact information:   [place] [address] [phone]       Signed: Jonnie Kind 07/16/2015, 5:35 PM

## 2015-07-16 NOTE — ED Provider Notes (Signed)
CSN: [id number]     Arrival date & time 07/16/15  0544 History   First MD Initiated Contact with Patient 07/16/15 [phone]     Chief Complaint  Patient presents with  . Cough  . Vaginal Bleeding     (Consider location/radiation/quality/duration/timing/severity/associated sxs/prior Treatment) HPI Jo Lynn is a 70 y.o. female  With extensive cardiac disease, diabetes, CKD, hx of ischemic colitis, pulmonary htn, presents to ED with complaint of a cough and vaginal bleeding. Pt states she has had dry non productive cough for almost a month. States her PCP told her it may be a cold or seasonal allergies. States cough comes and goes. Not taking any medications for it. Denies CP or SOB. States this morning, after coughing, she noticed large blood clots coming out of her vaginal area. Denies any prior abnormal vaginal bleeding. She is having some pain to the left lower quadrant of her abdomen. She states she is still bleeding vaginally. Denies any vaginal trauma. No vaginal pain. No dizziness or lightheadiness. Pt states she has known fibroids. Pt is on eliquis.   Past Medical History  Diagnosis Date  . Myocardial infarction ([place]) 10/2002    a. 2004 - MV CAD --> Referred for CABG.  . 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 s/p CABGx4 in 2004 with LIMA-LAD, SVG-RPDA, SVG-OM1-OM2. b. Inferior STEMI 08/2013 s/p BMS to occluded SVG-OM1-OM2, and Unsuccessful attempt at [facility] on proximal 100% SVG-RCA. c. Inferoposterior STEMI 05/2014 s/p Scoring balloon PTCA to dSVG-Cx.  . 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 Lucianne Lei Trigt: LIMA-LAD, SVG-RPDA, SVG-OM1-OM2  . Cardiomyopathy, ischemic   . Combined systolic and diastolic congestive heart failure, NYHA class 3 ([place])   . Essential hypertension   . Hyperlipidemia with target LDL less than 70   . Diabetes mellitus type 2 with complications ([place])   . 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 Lovena Le  . Colitis   . Cardiogenic shock  ([place])     a. Initiated on milrinone 09/2014.  . Ischemic colitis ([place]) 09/2014  . CKD (chronic kidney disease), stage IV ([place])   . Transaminitis   . Mild pulmonary hypertension ([place])     a. by [place] 09/2014.   Past Surgical History  Procedure Laterality Date  . Coronary artery bypass graft    . Left heart catheterization with coronary angiogram N/A 09/09/2013    Procedure: LEFT HEART CATHETERIZATION WITH CORONARY ANGIOGRAM;  Surgeon: Leonie Man, MD;  Location: [facility] CATH LAB;  Service: Cardiovascular;  Laterality: N/A;  . Implantable cardioverter defibrillator implant  02-21-2014    STJ single chamber ICD implanted by Dr Lovena Le for primary prevention  . Implantable cardioverter defibrillator implant N/A 02/21/2014    Procedure: IMPLANTABLE CARDIOVERTER DEFIBRILLATOR IMPLANT;  Surgeon: Evans Lance, MD;  Location[facility] CATH LAB;  Service: Cardiovascular;  Laterality: N/A;  . Left heart catheterization with coronary angiogram N/A 06/13/2014    Procedure: LEFT HEART CATHETERIZATION WITH CORONARY ANGIOGRAM;  Surgeon: Jettie Booze, MD;  Location: [facility] CATH LAB;  Service: Cardiovascular;  Laterality: N/A;  . Cardiac catheterization N/A 10/04/2014    Procedure: Right Heart Cath;  Surgeon: Jolaine Artist, MD;  Location: [place] CV LAB;  Service: Cardiovascular;  Laterality: N/A;   Family History  Problem Relation Age of Onset  . Cancer Mother     bone  . Heart disease Father   . Heart attack Neg Hx   . Stroke Neg Hx   .  Diabetes Sister    Social History  Substance Use Topics  . Smoking status: Former Smoker    Types: Cigarettes    Quit date: 02/25/1998  . Smokeless tobacco: Never Used     Comment: quit many years ago  . Alcohol Use: No   OB History    No data available     Review of Systems  Constitutional: Negative for fever and chills.  Respiratory: Positive for cough. Negative for chest tightness and shortness of breath.   Cardiovascular: Negative for chest pain,  palpitations and leg swelling.  Gastrointestinal: Positive for abdominal pain. Negative for nausea, vomiting and diarrhea.  Genitourinary: Positive for vaginal bleeding. Negative for dysuria, flank pain, vaginal discharge, vaginal pain and pelvic pain.  Musculoskeletal: Negative for myalgias, arthralgias, neck pain and neck stiffness.  Skin: Negative for rash.  Neurological: Negative for dizziness, weakness and headaches.  All other systems reviewed and are negative.     Allergies  Prednisone  Home Medications   Prior to Admission medications   Medication Sig Start Date End Date Taking? Authorizing Provider  allopurinol (ZYLOPRIM) 100 MG tablet Take 0.5 tablets (50 mg total) by mouth 2 (two) times daily. 06/08/15  Yes Mary-Margaret Hassell Done, FNP  apixaban (ELIQUIS) 5 MG TABS tablet Take 1 tablet (5 mg total) by mouth 2 (two) times daily. 06/08/15  Yes Mary-Margaret Hassell Done, FNP  aspirin EC 81 MG tablet Take 1 tablet (81 mg total) by mouth daily. 08/03/14  Yes Minus Breeding, MD  atorvastatin (LIPITOR) 40 MG tablet Take 1 tablet (40 mg total) by mouth at bedtime. 06/08/15  Yes Mary-Margaret Hassell Done, FNP  carvedilol (COREG) 6.25 MG tablet Take 1 tablet (6.25 mg total) by mouth 2 (two) times daily with a meal. 06/08/15  Yes Mary-Margaret Hassell Done, FNP  digoxin (LANOXIN) 0.125 MG tablet Take 0.5 tablets (0.0625 mg total) by mouth daily. 06/08/15  Yes Mary-Margaret Hassell Done, FNP  furosemide (LASIX) 40 MG tablet Take 1 tablet (40 mg total) by mouth 2 (two) times daily. 06/08/15  Yes Mary-Margaret Hassell Done, FNP  glimepiride (AMARYL) 2 MG tablet TAKE ONE TABLET BY MOUTH ONCE DAILY WITH BREAKFAST 06/08/15  Yes Mary-Margaret Hassell Done, FNP  Insulin Glargine (LANTUS SOLOSTAR) 100 UNIT/ML Solostar Pen Inject up to 30 units once daily Patient taking differently: Inject 25 Units into the skin daily at 10 pm.  06/08/15  Yes Mary-Margaret Hassell Done, FNP  isosorbide-hydrALAZINE (BIDIL) 20-37.5 MG tablet Take 1.5 tablets by mouth 3  (three) times daily. 06/08/15  Yes Mary-Margaret Hassell Done, FNP  KLOR-CON M20 20 MEQ tablet Take 1 tablet (20 mEq total) by mouth daily. 06/08/15  Yes Mary-Margaret Hassell Done, FNP  spironolactone (ALDACTONE) 25 MG tablet Take 1 tablet (25 mg total) by mouth every other day. 06/08/15  Yes Mary-Margaret Hassell Done, FNP  Insulin Pen Needle 32G X 4 MM MISC Use with Basaglar insulin pen to inject once daily 03/06/15   Tammy Eckard, PHARMD  LANTUS SOLOSTAR 100 UNIT/ML Solostar Pen INJECT UP TO 20 UNITS ONCE DAILY 06/29/15   Mary-Margaret Hassell Done, FNP   BP 106/76 mmHg  Pulse 79  Temp(Src) 98.8 F (37.1 C) (Oral)  Resp 18  SpO2 100% Physical Exam  Constitutional: She is oriented to person, place, and time. She appears well-developed and well-nourished. No distress.  HENT:  Head: Normocephalic.  Eyes: Conjunctivae are normal.  Neck: Neck supple.  Cardiovascular: Normal rate and regular rhythm.   Murmur heard. Pulmonary/Chest: Effort normal and breath sounds normal. No respiratory distress. She has no wheezes. She has no rales.  Abdominal: Soft. Bowel sounds are normal. She exhibits no distension. There is no tenderness. There is no rebound.  Genitourinary:  Normal external genetalia. Normal vaginal canal, bright red blood in the canal. Cervix appeared normal. No CMT. ttp over left adnexa and uterus  Musculoskeletal: She exhibits no edema.  Neurological: She is alert and oriented to person, place, and time.  Skin: Skin is warm and dry.  Psychiatric: She has a normal mood and affect. Her behavior is normal.  Nursing note and vitals reviewed.   ED Course  Procedures (including critical care time) Labs Review Labs Reviewed  URINALYSIS, ROUTINE W REFLEX MICROSCOPIC (NOT AT [facility]) - Abnormal; Notable for the following:    APPearance CLOUDY (*)    Hgb urine dipstick MODERATE (*)    Leukocytes, UA SMALL (*)    All other components within normal limits  CBC WITH DIFFERENTIAL/PLATELET - Abnormal; Notable for the  following:    WBC 11.8 (*)    RBC 2.90 (*)    Hemoglobin 8.3 (*)    HCT 26.5 (*)    Neutro Abs 9.4 (*)    All other components within normal limits  BASIC METABOLIC PANEL - Abnormal; Notable for the following:    Glucose, Bld 345 (*)    BUN 42 (*)    Creatinine, Ser 1.99 (*)    GFR calc non Af Amer 24 (*)    GFR calc Af Amer 28 (*)    All other components within normal limits  URINE MICROSCOPIC-ADD ON - Abnormal; Notable for the following:    Squamous Epithelial / LPF 0-5 (*)    Bacteria, UA MANY (*)    All other components within normal limits  SAMPLE TO BLOOD BANK    Imaging Review Dg Chest 2 View  07/16/2015  CLINICAL DATA:  Chest pain, cough x1 month EXAM: CHEST  2 VIEW COMPARISON:  10/04/2014 FINDINGS: Lungs are clear.  No pleural effusion or pneumothorax. Mild cardiomegaly.  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 Degenerative changes of the visualized thoracolumbar spine. IMPRESSION: No evidence of acute cardiopulmonary disease. Electronically Signed   By: Julian Hy M.D.   On: 07/16/2015 09:47   US Pelvis Complete  07/16/2015  CLINICAL DATA:  Vaginal bleeding, left adnexal pain, history of calcified fibroids EXAM: TRANSABDOMINAL ULTRASOUND OF PELVIS TECHNIQUE: Transabdominal ultrasound examination of the pelvis was performed including evaluation of the uterus, ovaries, adnexal regions, and pelvic cul-de-sac. COMPARISON:  CT abdomen pelvis dated 09/28/2014 FINDINGS: Uterus Measurements: 15.4 x 7.8 x 7.7 cm. Multiple calcified fibroids, including a dominant 7.0 x 6.7 x 7.7 cm calcified intramural fibroid in the left uterine body. Endometrium Not discretely visualized/distorted by multiple fibroids. Right ovary Not discretely visualized. Left ovary Measurements: 2.4 x 2.0 x 2.2 cm. Normal appearance/no adnexal mass. Other findings:  Moderate pelvic fluid. IMPRESSION: Multiple calcified uterine fibroids, measuring up to 7.7 cm in the left uterine body,  better visualized on prior CT. Endometrium and right ovary are not discretely visualized. Left ovary is within normal limits. Electronically Signed   By: Julian Hy M.D.   On: 07/16/2015 12:56   I have personally reviewed and evaluated these images and lab results as part of my medical decision-making.   EKG Interpretation None      MDM   Final diagnoses:  Vaginal bleeding  Anemia, unspecified anemia type   Patient with persistent dry cough for several weeks and now vaginal bleeding onset this morning. Patient does have bright red blood  in vaginal canal on exam. Will check labs including blood counts, electrolytes, chest x-ray, will get an ultrasound of pelvis.  Patient's hemoglobin is significantly lower today than it has been in the past. Last hemoglobin we have is from August 2016. Patient continues to have some blood on her pad. Ultrasound of pelvis showed fibroids, no other findings. Patient's vital signs remain normal. I discussed the case with Dr. Glo Herring with OB/GYN, he recommended starting patient on Megace. 120 mg by mouth given in emergency department. [facility] and hemoglobin recheck. All results discussed with patient and her daughter. They agreed to the plan. She was also given some of her home medications prior to transfer  [place]:   07/16/15 1030 07/16/15 1247 07/16/15 1300 07/16/15 1515  BP: 122/75 114/69 110/68 107/43  Pulse: 84 84 86 98  Temp:    99.8 F (37.7 C)  TempSrc:    Oral  Resp: 18 18 18 18   SpO2: 100% 100% 100% 99%     Jeannett Senior, PA-C 07/16/15 1611  Carmin Muskrat, MD 07/17/15 [phone]

## 2015-07-16 NOTE — Progress Notes (Signed)

## 2015-07-16 NOTE — ED Notes (Signed)
Patient transported to Ultrasound 

## 2015-07-16 NOTE — H&P (Signed)
Jo Lynn is an 70 y.o. female. She is transferred to [facility] from [place] emergency room for continued evaluation of postmenopausal bleeding which began this a.m. Below is the history of present illness from the emergency room; HPI Jo Lynn is a 70 y.o. female With extensive cardiac disease, diabetes, CKD, hx of ischemic colitis, pulmonary htn, presents to ED with complaint of a cough and vaginal bleeding. Pt states she has had dry non productive cough for almost a month. States her PCP told her it may be a cold or seasonal allergies. States cough comes and goes. Not taking any medications for it. Denies CP or SOB. States this morning, after coughing, she noticed large blood clots coming out of her vaginal area. Denies any prior abnormal vaginal bleeding. She is having some pain to the left lower quadrant of her abdomen. She states she is still bleeding vaginally. Denies any vaginal trauma. No vaginal pain. No dizziness or lightheadiness. Pt states she has known fibroids. Pt is on eliquis.   Further questioning reveals that the patient did have some cramping in the left lower quadrant during her episode of bleeding, but that is resolved. The bleeding is minimal at this time. Pertinent Gynecological History: Menses: post-menopausal Bleeding: None in many many years, none since menopause until today Contraception:  DES exposure: unknown Blood transfusions: none Sexually transmitted diseases: no past history Previous GYN Procedures:   Last mammogram: normal Date: 08/04/2014 Last pap: None on record Date:  OB History: G, P   Menstrual History: Menarche age:  No LMP recorded. Patient is postmenopausal.    Past Medical History  Diagnosis Date  . Myocardial infarction ([place]) 10/2002    a. 2004 - MV CAD --> Referred for CABG.  . 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 s/p CABGx4 in 2004 with LIMA-LAD, SVG-RPDA, SVG-OM1-OM2. b. Inferior STEMI 08/2013 s/p BMS to occluded  SVG-OM1-OM2, and Unsuccessful attempt at [facility] on proximal 100% SVG-RCA. c. Inferoposterior STEMI 05/2014 s/p Scoring balloon PTCA to dSVG-Cx.  . 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 Lucianne Lei Trigt: LIMA-LAD, SVG-RPDA, SVG-OM1-OM2  . Cardiomyopathy, ischemic   . Combined systolic and diastolic congestive heart failure, NYHA class 3 ([place])   . Essential hypertension   . Hyperlipidemia with target LDL less than 70   . Diabetes mellitus type 2 with complications ([place])   . 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 Lovena Le  . Colitis   . Cardiogenic shock ([place])     a. Initiated on milrinone 09/2014.  . Ischemic colitis ([place]) 09/2014  . CKD (chronic kidney disease), stage IV ([place])   . Transaminitis   . Mild pulmonary hypertension ([place])     a. by [place] 09/2014.    Past Surgical History  Procedure Laterality Date  . Coronary artery bypass graft    . Left heart catheterization with coronary angiogram N/A 09/09/2013    Procedure: LEFT HEART CATHETERIZATION WITH CORONARY ANGIOGRAM;  Surgeon: Leonie Man, MD;  Location: [facility] CATH LAB;  Service: Cardiovascular;  Laterality: N/A;  . Implantable cardioverter defibrillator implant  02-21-2014    STJ single chamber ICD implanted by Dr Lovena Le for primary prevention  . Implantable cardioverter defibrillator implant N/A 02/21/2014    Procedure: IMPLANTABLE CARDIOVERTER DEFIBRILLATOR IMPLANT;  Surgeon: Evans Lance, MD;  Location: [facility] CATH LAB;  Service: Cardiovascular;  Laterality: N/A;  . Left heart catheterization with coronary angiogram N/A 06/13/2014    Procedure: LEFT HEART CATHETERIZATION WITH  CORONARY ANGIOGRAM;  Surgeon: Jettie Booze, MD;  Location: [facility] CATH LAB;  Service: Cardiovascular;  Laterality: N/A;  . Cardiac catheterization N/A 10/04/2014    Procedure: Right Heart Cath;  Surgeon: Jolaine Artist, MD;  Location: [place] CV LAB;  Service: Cardiovascular;  Laterality: N/A;    Family History  Problem  Relation Age of Onset  . Cancer Mother     bone  . Heart disease Father   . Heart attack Neg Hx   . Stroke Neg Hx   . Diabetes Sister     Social History:  reports that she quit smoking about 17 years ago. Her smoking use included Cigarettes. She has never used smokeless tobacco. She reports that she does not drink alcohol or use illicit drugs.  Allergies:  Allergies  Allergen Reactions  . Prednisone Other (See Comments)    "Did not feel good at all"    Prescriptions prior to admission  Medication Sig Dispense Refill Last Dose  . allopurinol (ZYLOPRIM) 100 MG tablet Take 0.5 tablets (50 mg total) by mouth 2 (two) times daily.   07/15/2015 at Unknown time  . apixaban (ELIQUIS) 5 MG TABS tablet Take 1 tablet (5 mg total) by mouth 2 (two) times daily. 180 tablet 0 07/15/2015 at Unknown time  . aspirin EC 81 MG tablet Take 1 tablet (81 mg total) by mouth daily.   07/15/2015 at Unknown time  . atorvastatin (LIPITOR) 40 MG tablet Take 1 tablet (40 mg total) by mouth at bedtime. 90 tablet 0 07/15/2015 at Unknown time  . carvedilol (COREG) 6.25 MG tablet Take 1 tablet (6.25 mg total) by mouth 2 (two) times daily with a meal. 60 tablet 6 07/15/2015 at 1900  . digoxin (LANOXIN) 0.125 MG tablet Take 0.5 tablets (0.0625 mg total) by mouth daily. 45 tablet 3 07/15/2015 at Unknown time  . furosemide (LASIX) 40 MG tablet Take 1 tablet (40 mg total) by mouth 2 (two) times daily. 30 tablet 5 07/15/2015 at Unknown time  . glimepiride (AMARYL) 2 MG tablet TAKE ONE TABLET BY MOUTH ONCE DAILY WITH BREAKFAST 30 tablet 5 07/15/2015 at Unknown time  . Insulin Glargine (LANTUS SOLOSTAR) 100 UNIT/ML Solostar Pen Inject up to 30 units once daily (Patient taking differently: Inject 25 Units into the skin daily at 10 pm. ) 5 pen 1 07/15/2015 at Unknown time  . isosorbide-hydrALAZINE (BIDIL) 20-37.5 MG tablet Take 1.5 tablets by mouth 3 (three) times daily. 135 tablet 6 07/15/2015 at Unknown time  . KLOR-CON M20 20 MEQ tablet  Take 1 tablet (20 mEq total) by mouth daily. 30 tablet 0 07/15/2015 at Unknown time  . spironolactone (ALDACTONE) 25 MG tablet Take 1 tablet (25 mg total) by mouth every other day. 30 tablet 5 07/14/2015  . Insulin Pen Needle 32G X 4 MM MISC Use with Basaglar insulin pen to inject once daily 100 each 1 Taking  . LANTUS SOLOSTAR 100 UNIT/ML Solostar Pen INJECT UP TO 20 UNITS ONCE DAILY 15 mL 1     ROS notable for large uterine fibroids. She never had surgery ultrasound today confirms fibroids uterine enlargement almost to the umbilicus no recent weight gain or weight loss. No recent Paps  Blood pressure 107/43, pulse 98, temperature 99.8 F (37.7 C), temperature source Oral, resp. rate 18, height 5\' 7"  (1.702 m), weight 83.915 kg (185 lb), SpO2 99 %. Physical Exam  Constitutional: She is oriented to person, place, and time. She appears well-developed and well-nourished. No distress.  HENT:  Head: Normocephalic.  Eyes: Pupils are equal, round, and reactive to light.  Cardiovascular: Normal rate.   Respiratory: Effort normal.  GI: Soft. She exhibits mass.  She has a nontender firm pelvic mass extending up to the umbilicus, stable unchanged. The previous left lower quadrant pain associated with the bleeding episode has resolved  Genitourinary:  Documented in the ED physician's note and not repeated. The bleeding was confirmed as coming from the cervix, and has since become minimal  Musculoskeletal: Normal range of motion.  Neurological: She is alert and oriented to person, place, and time.  Skin: Skin is warm and dry.  Psychiatric: She has a normal mood and affect. Her behavior is normal. Thought content normal.    Results for orders placed or performed during the hospital encounter of 07/16/15 (from the past 24 hour(s))  Urinalysis, Routine w reflex microscopic (not at [facility])     Status: Abnormal   Collection Time: 07/16/15  8:09 AM  Result Value Ref Range   Color, Urine YELLOW YELLOW    APPearance CLOUDY (A) CLEAR   Specific Gravity, Urine 1.010 1.005 - 1.030   pH 5.0 5.0 - 8.0   Glucose, UA NEGATIVE NEGATIVE mg/dL   Hgb urine dipstick MODERATE (A) NEGATIVE   Bilirubin Urine NEGATIVE NEGATIVE   Ketones, ur NEGATIVE NEGATIVE mg/dL   Protein, ur NEGATIVE NEGATIVE mg/dL   Nitrite NEGATIVE NEGATIVE   Leukocytes, UA SMALL (A) NEGATIVE  Urine microscopic-add on     Status: Abnormal   Collection Time: 07/16/15  8:09 AM  Result Value Ref Range   Squamous Epithelial / LPF 0-5 (A) NONE SEEN   WBC, UA 6-30 0 - 5 WBC/hpf   RBC / HPF TOO NUMEROUS TO COUNT 0 - 5 RBC/hpf   Bacteria, UA MANY (A) NONE SEEN  Sample to Blood Bank     Status: None   Collection Time: 07/16/15  8:54 AM  Result Value Ref Range   Blood Bank Specimen SAMPLE AVAILABLE FOR TESTING    Sample Expiration 07/17/2015   CBC with Differential     Status: Abnormal   Collection Time: 07/16/15  9:00 AM  Result Value Ref Range   WBC 11.8 (H) 4.0 - 10.5 K/uL   RBC 2.90 (L) 3.87 - 5.11 MIL/uL   Hemoglobin 8.3 (L) 12.0 - 15.0 g/dL   HCT 26.5 (L) 36.0 - 46.0 %   MCV 91.4 78.0 - 100.0 fL   MCH 28.6 26.0 - 34.0 pg   MCHC 31.3 30.0 - 36.0 g/dL   RDW 14.9 11.5 - 15.5 %   Platelets 177 150 - 400 K/uL   Neutrophils Relative % 79 %   Neutro Abs 9.4 (H) 1.7 - 7.7 K/uL   Lymphocytes Relative 12 %   Lymphs Abs 1.4 0.7 - 4.0 K/uL   Monocytes Relative 8 %   Monocytes Absolute 1.0 0.1 - 1.0 K/uL   Eosinophils Relative 1 %   Eosinophils Absolute 0.1 0.0 - 0.7 K/uL   Basophils Relative 0 %   Basophils Absolute 0.0 0.0 - 0.1 K/uL  Basic metabolic panel     Status: Abnormal   Collection Time: 07/16/15  9:00 AM  Result Value Ref Range   Sodium 137 135 - 145 mmol/L   Potassium 4.5 3.5 - 5.1 mmol/L   Chloride 105 101 - 111 mmol/L   CO2 22 22 - 32 mmol/L   Glucose, Bld 345 (H) 65 - 99 mg/dL   BUN 42 (H) 6 - 20 mg/dL  Creatinine, Ser 1.99 (H) 0.44 - 1.00 mg/dL   Calcium 9.3 8.9 - 10.3 mg/dL   GFR calc non Af Amer 24 (L)  >60 mL/min   GFR calc Af Amer 28 (L) >60 mL/min   Anion gap 10 5 - 15    Dg Chest 2 View  07/16/2015  CLINICAL DATA:  Chest pain, cough x1 month EXAM: CHEST  2 VIEW COMPARISON:  10/04/2014 FINDINGS: Lungs are clear.  No pleural effusion or pneumothorax. Mild cardiomegaly.  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 Degenerative changes of the visualized thoracolumbar spine. IMPRESSION: No evidence of acute cardiopulmonary disease. Electronically Signed   By: Julian Hy M.D.   On: 07/16/2015 09:47   US Pelvis Complete  07/16/2015  CLINICAL DATA:  Vaginal bleeding, left adnexal pain, history of calcified fibroids EXAM: TRANSABDOMINAL ULTRASOUND OF PELVIS TECHNIQUE: Transabdominal ultrasound examination of the pelvis was performed including evaluation of the uterus, ovaries, adnexal regions, and pelvic cul-de-sac. COMPARISON:  CT abdomen pelvis dated 09/28/2014 FINDINGS: Uterus Measurements: 15.4 x 7.8 x 7.7 cm. Multiple calcified fibroids, including a dominant 7.0 x 6.7 x 7.7 cm calcified intramural fibroid in the left uterine body. Endometrium Not discretely visualized/distorted by multiple fibroids. Right ovary Not discretely visualized. Left ovary Measurements: 2.4 x 2.0 x 2.2 cm. Normal appearance/no adnexal mass. Other findings:  Moderate pelvic fluid. IMPRESSION: Multiple calcified uterine fibroids, measuring up to 7.7 cm in the left uterine body, better visualized on prior CT. Endometrium and right ovary are not discretely visualized. Left ovary is within normal limits. Electronically Signed   By: Julian Hy M.D.   On: 07/16/2015 12:56    Assessment/Plan: Postmenopausal bleeding while on Eliquis Stable fibroid uterus  Plan plan: Will continue workup and vaginal ultrasound will be performed. We will set her outpatient management since she is no longer bleeding, depending on the ultrasound findings   Clyde Upshaw V 07/16/2015, 5:14 PM

## 2015-07-16 NOTE — ED Notes (Signed)
Bed assigned at [facility]: 316.

## 2015-07-23 ENCOUNTER — Inpatient Hospital Stay (HOSPITAL_COMMUNITY)
Admission: EM | Admit: 2015-07-23 | Discharge: 2015-07-27 | DRG: 812 | Disposition: E | Payer: BLUE CROSS/BLUE SHIELD | Attending: Internal Medicine | Admitting: Internal Medicine

## 2015-07-23 ENCOUNTER — Encounter (HOSPITAL_COMMUNITY): Payer: Self-pay

## 2015-07-23 DIAGNOSIS — N184 Chronic kidney disease, stage 4 (severe): Secondary | ICD-10-CM | POA: Diagnosis present

## 2015-07-23 DIAGNOSIS — Z95810 Presence of automatic (implantable) cardiac defibrillator: Secondary | ICD-10-CM | POA: Diagnosis present

## 2015-07-23 DIAGNOSIS — I272 Other secondary pulmonary hypertension: Secondary | ICD-10-CM | POA: Diagnosis present

## 2015-07-23 DIAGNOSIS — E875 Hyperkalemia: Secondary | ICD-10-CM

## 2015-07-23 DIAGNOSIS — I130 Hypertensive heart and chronic kidney disease with heart failure and stage 1 through stage 4 chronic kidney disease, or unspecified chronic kidney disease: Secondary | ICD-10-CM | POA: Diagnosis present

## 2015-07-23 DIAGNOSIS — I25810 Atherosclerosis of coronary artery bypass graft(s) without angina pectoris: Secondary | ICD-10-CM | POA: Diagnosis present

## 2015-07-23 DIAGNOSIS — Z515 Encounter for palliative care: Secondary | ICD-10-CM | POA: Diagnosis present

## 2015-07-23 DIAGNOSIS — E1165 Type 2 diabetes mellitus with hyperglycemia: Secondary | ICD-10-CM | POA: Diagnosis present

## 2015-07-23 DIAGNOSIS — E785 Hyperlipidemia, unspecified: Secondary | ICD-10-CM | POA: Diagnosis present

## 2015-07-23 DIAGNOSIS — N189 Chronic kidney disease, unspecified: Secondary | ICD-10-CM

## 2015-07-23 DIAGNOSIS — I480 Paroxysmal atrial fibrillation: Secondary | ICD-10-CM | POA: Diagnosis present

## 2015-07-23 DIAGNOSIS — I255 Ischemic cardiomyopathy: Secondary | ICD-10-CM | POA: Diagnosis present

## 2015-07-23 DIAGNOSIS — N179 Acute kidney failure, unspecified: Secondary | ICD-10-CM | POA: Diagnosis present

## 2015-07-23 DIAGNOSIS — Z9861 Coronary angioplasty status: Secondary | ICD-10-CM

## 2015-07-23 DIAGNOSIS — Z808 Family history of malignant neoplasm of other organs or systems: Secondary | ICD-10-CM

## 2015-07-23 DIAGNOSIS — Z66 Do not resuscitate: Secondary | ICD-10-CM | POA: Diagnosis present

## 2015-07-23 DIAGNOSIS — I739 Peripheral vascular disease, unspecified: Secondary | ICD-10-CM

## 2015-07-23 DIAGNOSIS — Z951 Presence of aortocoronary bypass graft: Secondary | ICD-10-CM

## 2015-07-23 DIAGNOSIS — I252 Old myocardial infarction: Secondary | ICD-10-CM

## 2015-07-23 DIAGNOSIS — Z888 Allergy status to other drugs, medicaments and biological substances status: Secondary | ICD-10-CM

## 2015-07-23 DIAGNOSIS — Z8249 Family history of ischemic heart disease and other diseases of the circulatory system: Secondary | ICD-10-CM

## 2015-07-23 DIAGNOSIS — R079 Chest pain, unspecified: Secondary | ICD-10-CM

## 2015-07-23 DIAGNOSIS — M79671 Pain in right foot: Secondary | ICD-10-CM | POA: Diagnosis present

## 2015-07-23 DIAGNOSIS — N950 Postmenopausal bleeding: Secondary | ICD-10-CM | POA: Diagnosis present

## 2015-07-23 DIAGNOSIS — D62 Acute posthemorrhagic anemia: Principal | ICD-10-CM | POA: Diagnosis present

## 2015-07-23 DIAGNOSIS — Z833 Family history of diabetes mellitus: Secondary | ICD-10-CM

## 2015-07-23 DIAGNOSIS — I5042 Chronic combined systolic (congestive) and diastolic (congestive) heart failure: Secondary | ICD-10-CM | POA: Diagnosis present

## 2015-07-23 DIAGNOSIS — Z87891 Personal history of nicotine dependence: Secondary | ICD-10-CM

## 2015-07-23 DIAGNOSIS — E1122 Type 2 diabetes mellitus with diabetic chronic kidney disease: Secondary | ICD-10-CM | POA: Diagnosis present

## 2015-07-23 DIAGNOSIS — IMO0002 Reserved for concepts with insufficient information to code with codable children: Secondary | ICD-10-CM | POA: Diagnosis present

## 2015-07-23 DIAGNOSIS — I9589 Other hypotension: Secondary | ICD-10-CM

## 2015-07-23 DIAGNOSIS — N938 Other specified abnormal uterine and vaginal bleeding: Secondary | ICD-10-CM | POA: Diagnosis present

## 2015-07-23 DIAGNOSIS — E1151 Type 2 diabetes mellitus with diabetic peripheral angiopathy without gangrene: Secondary | ICD-10-CM | POA: Diagnosis present

## 2015-07-23 DIAGNOSIS — M79661 Pain in right lower leg: Secondary | ICD-10-CM

## 2015-07-23 DIAGNOSIS — I959 Hypotension, unspecified: Secondary | ICD-10-CM | POA: Diagnosis present

## 2015-07-23 DIAGNOSIS — R52 Pain, unspecified: Secondary | ICD-10-CM

## 2015-07-23 DIAGNOSIS — E861 Hypovolemia: Secondary | ICD-10-CM | POA: Diagnosis present

## 2015-07-23 LAB — CBC WITH DIFFERENTIAL/PLATELET
BASOS ABS: 0 10*3/uL (ref 0.0–0.1)
BASOS PCT: 0 %
EOS ABS: 0 10*3/uL (ref 0.0–0.7)
EOS PCT: 0 %
HCT: 16.5 % — ABNORMAL LOW (ref 36.0–46.0)
Hemoglobin: 5.2 g/dL — CL (ref 12.0–15.0)
Lymphocytes Relative: 9 %
Lymphs Abs: 1.2 10*3/uL (ref 0.7–4.0)
MCH: 28.4 pg (ref 26.0–34.0)
MCHC: 31.5 g/dL (ref 30.0–36.0)
MCV: 90.2 fL (ref 78.0–100.0)
MONO ABS: 0.6 10*3/uL (ref 0.1–1.0)
Monocytes Relative: 4 %
Neutro Abs: 11.6 10*3/uL — ABNORMAL HIGH (ref 1.7–7.7)
Neutrophils Relative %: 86 %
PLATELETS: 343 10*3/uL (ref 150–400)
RBC: 1.83 MIL/uL — AB (ref 3.87–5.11)
RDW: 15 % (ref 11.5–15.5)
WBC: 13.5 10*3/uL — AB (ref 4.0–10.5)

## 2015-07-23 LAB — I-STAT CHEM 8, ED
BUN: 55 mg/dL — AB (ref 6–20)
CHLORIDE: 104 mmol/L (ref 101–111)
CREATININE: 2.8 mg/dL — AB (ref 0.44–1.00)
Calcium, Ion: 1.07 mmol/L — ABNORMAL LOW (ref 1.13–1.30)
GLUCOSE: 374 mg/dL — AB (ref 65–99)
HCT: 17 % — ABNORMAL LOW (ref 36.0–46.0)
HEMOGLOBIN: 5.8 g/dL — AB (ref 12.0–15.0)
POTASSIUM: 5.7 mmol/L — AB (ref 3.5–5.1)
Sodium: 136 mmol/L (ref 135–145)
TCO2: 14 mmol/L (ref 0–100)

## 2015-07-23 LAB — I-STAT TROPONIN, ED: Troponin i, poc: 0 ng/mL (ref 0.00–0.08)

## 2015-07-23 LAB — PROTIME-INR
INR: 1.85 — ABNORMAL HIGH (ref 0.00–1.49)
PROTHROMBIN TIME: 21.3 s — AB (ref 11.6–15.2)

## 2015-07-23 MED ORDER — ACETAMINOPHEN 500 MG PO TABS
1000.0000 mg | ORAL_TABLET | Freq: Once | ORAL | Status: AC
  Administered 2015-07-24: 1000 mg via ORAL
  Filled 2015-07-23: qty 2

## 2015-07-23 MED ORDER — SODIUM CHLORIDE 0.9 % IV BOLUS (SEPSIS)
1000.0000 mL | Freq: Once | INTRAVENOUS | Status: AC
  Administered 2015-07-23: 1000 mL via INTRAVENOUS

## 2015-07-23 NOTE — ED Notes (Signed)
Pt has been having vag bleeding X 1 week, after having a procedure several days ago. Pt states that she is having CP but says it feels like indigestion and denies other cardiac symptoms. Per EMS pt did vomit one time and received 4mg  of zofran. Pt also c/o R foot pain.

## 2015-07-23 NOTE — ED Provider Notes (Signed)
CSN: [id number]     Arrival date & time 07/22/2015  2213 History   First MD Initiated Contact with Patient 07/13/2015 2223     Chief Complaint  Patient presents with  . Vaginal Bleeding  . Chest Pain  . Foot Pain     (Consider location/radiation/quality/duration/timing/severity/associated sxs/prior Treatment) HPI   70 year old female with a history of coronary artery disease status post CABG, ischemic cardiomyopathy, hypertension, hyperlipidemia, diabetes, ischemic colitis, emergency department visit with admission to women's one week ago for significant vaginal bleeding and hemoglobin dropped from 10-8. Ultrasound at that time showed possible blood clot versus endometrial mass, with plan for patient to continue Megace and follow-up as an outpatient. Patient reports continuing vaginal bleeding since this time, however it has decreased. Patient's major concern today is right foot pain.  Initially patient reports that pain hasn't present for a very long time, and that she has received injections in this ankle from Dr. Sharol Given. Reports that the pain is in the same location, however significantly worse. On further discussion with patient, she reports she does have some extremity pain with ambulation in the past that improved with rest, and that severe pain in her right foot really began today. Pain is very severe, and his distracting making it difficult to obtain a history.  Patient reports she was soacking 6 pads per day, however this has decreased, however is unable to report the exact severity of her vaginal bleeding at this time. Reports it has improved, however continued. Daughter reports severe generalized weakness since patient began having vaginal bleeding 1 week ago. Patient also reports chest pain, that feels like indigestion, that began after eating today. No associated shortness of breath.   Past Medical History  Diagnosis Date  . Myocardial infarction ([place]) 10/2002    a. 2004 - MV CAD -->  Referred for CABG.  . 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 s/p CABGx4 in 2004 with LIMA-LAD, SVG-RPDA, SVG-OM1-OM2. b. Inferior STEMI 08/2013 s/p BMS to occluded SVG-OM1-OM2, and Unsuccessful attempt at [facility] on proximal 100% SVG-RCA. c. Inferoposterior STEMI 05/2014 s/p Scoring balloon PTCA to dSVG-Cx.  . 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 Lucianne Lei Trigt: LIMA-LAD, SVG-RPDA, SVG-OM1-OM2  . Cardiomyopathy, ischemic   . Combined systolic and diastolic congestive heart failure, NYHA class 3 ([place])   . Essential hypertension   . Hyperlipidemia with target LDL less than 70   . Diabetes mellitus type 2 with complications ([place])   . 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 Lovena Le  . Colitis   . Cardiogenic shock ([place])     a. Initiated on milrinone 09/2014.  . Ischemic colitis ([place]) 09/2014  . CKD (chronic kidney disease), stage IV ([place])   . Transaminitis   . Mild pulmonary hypertension ([place])     a. by [place] 09/2014.   Past Surgical History  Procedure Laterality Date  . Coronary artery bypass graft    . Left heart catheterization with coronary angiogram N/A 09/09/2013    Procedure: LEFT HEART CATHETERIZATION WITH CORONARY ANGIOGRAM;  Surgeon: Leonie Man, MD;  Location: [facility] CATH LAB;  Service: Cardiovascular;  Laterality: N/A;  . Implantable cardioverter defibrillator implant  02-21-2014    STJ single chamber ICD implanted by Dr Lovena Le for primary prevention  . Implantable cardioverter defibrillator implant N/A 02/21/2014    Procedure: IMPLANTABLE CARDIOVERTER DEFIBRILLATOR IMPLANT;  Surgeon: Evans Lance, MD;  Location: [facility] CATH LAB;  Service: Cardiovascular;  Laterality: N/A;  .  Left heart catheterization with coronary angiogram N/A 06/13/2014    Procedure: LEFT HEART CATHETERIZATION WITH CORONARY ANGIOGRAM;  Surgeon: Jettie Booze, MD;  Location: [facility] CATH LAB;  Service: Cardiovascular;  Laterality: N/A;  . Cardiac catheterization N/A 10/04/2014    Procedure:  Right Heart Cath;  Surgeon: Jolaine Artist, MD;  Location: [facility] CV LAB;  Service: Cardiovascular;  Laterality: N/A;   Family History  Problem Relation Age of Onset  . Cancer Mother     bone  . Heart disease Father   . Heart attack Neg Hx   . Stroke Neg Hx   . Diabetes Sister    Social History  Substance Use Topics  . Smoking status: Former Smoker    Types: Cigarettes    Quit date: 02/25/1998  . Smokeless tobacco: Never Used     Comment: quit many years ago  . Alcohol Use: No   OB History    No data available     Review of Systems  Constitutional: Positive for fatigue. Negative for fever.  HENT: Negative for sore throat.   Eyes: Negative for visual disturbance.  Respiratory: Negative for cough and shortness of breath.   Cardiovascular: Positive for chest pain. Negative for leg swelling.  Gastrointestinal: Negative for nausea, vomiting, abdominal pain, blood in stool and anal bleeding.  Genitourinary: Positive for vaginal bleeding. Negative for difficulty urinating.  Musculoskeletal: Positive for arthralgias. Negative for back pain and neck pain.  Skin: Negative for rash.  Neurological: Positive for light-headedness. Negative for syncope and headaches.      Allergies  Prednisone  Home Medications   Prior to Admission medications   Medication Sig Start Date End Date Taking? Authorizing Provider  allopurinol (ZYLOPRIM) 100 MG tablet Take 0.5 tablets (50 mg total) by mouth 2 (two) times daily. 06/08/15  Yes Mary-Margaret Hassell Done, FNP  apixaban (ELIQUIS) 5 MG TABS tablet Take 1 tablet (5 mg total) by mouth 2 (two) times daily. 06/08/15  Yes Mary-Margaret Hassell Done, FNP  aspirin EC 81 MG tablet Take 1 tablet (81 mg total) by mouth daily. 08/03/14  Yes Minus Breeding, MD  atorvastatin (LIPITOR) 40 MG tablet Take 1 tablet (40 mg total) by mouth at bedtime. 06/08/15  Yes Mary-Margaret Hassell Done, FNP  carvedilol (COREG) 6.25 MG tablet Take 1 tablet (6.25 mg total) by mouth 2  (two) times daily with a meal. 06/08/15  Yes Mary-Margaret Hassell Done, FNP  digoxin (LANOXIN) 0.125 MG tablet Take 0.5 tablets (0.0625 mg total) by mouth daily. 06/08/15  Yes Mary-Margaret Hassell Done, FNP  furosemide (LASIX) 40 MG tablet Take 1 tablet (40 mg total) by mouth 2 (two) times daily. 06/08/15  Yes Mary-Margaret Hassell Done, FNP  glimepiride (AMARYL) 2 MG tablet TAKE ONE TABLET BY MOUTH ONCE DAILY WITH BREAKFAST 06/08/15  Yes Mary-Margaret Hassell Done, FNP  Insulin Glargine (LANTUS SOLOSTAR) 100 UNIT/ML Solostar Pen Inject up to 30 units once daily Patient taking differently: Inject 25 Units into the skin daily at 10 pm.  06/08/15  Yes Mary-Margaret Hassell Done, FNP  Insulin Pen Needle 32G X 4 MM MISC Use with Basaglar insulin pen to inject once daily 03/06/15  Yes Tammy Eckard, PHARMD  isosorbide-hydrALAZINE (BIDIL) 20-37.5 MG tablet Take 1.5 tablets by mouth 3 (three) times daily. 06/08/15  Yes Mary-Margaret Hassell Done, FNP  KLOR-CON M20 20 MEQ tablet Take 1 tablet (20 mEq total) by mouth daily. 06/08/15  Yes Mary-Margaret Hassell Done, FNP  LANTUS SOLOSTAR 100 UNIT/ML Solostar Pen INJECT UP TO 20 UNITS ONCE DAILY 06/29/15  Yes Mary-Margaret Hassell Done, FNP  megestrol (MEGACE) 40 MG tablet Take 1 tablet (40 mg total) by mouth daily. 07/16/15  Yes Jonnie Kind, MD  spironolactone (ALDACTONE) 25 MG tablet Take 1 tablet (25 mg total) by mouth every other day. 06/08/15  Yes Mary-Margaret Hassell Done, FNP   BP 94/68 mmHg  Pulse 81  Temp(Src) 97.3 F (36.3 C) (Oral)  Resp 25  Ht 5\' 7"  (1.702 m)  Wt 184 lb (83.462 kg)  BMI 28.81 kg/m2  SpO2 100% Physical Exam  Constitutional: She is oriented to person, place, and time. She appears well-developed and well-nourished. She appears ill. She appears distressed (in pain).  HENT:  Head: Normocephalic and atraumatic.  Eyes: Conjunctivae and EOM are normal.  Neck: Normal range of motion.  Cardiovascular: Normal rate, regular rhythm, normal heart sounds and intact distal pulses.  Exam reveals no  gallop and no friction rub.   No murmur heard. Pulmonary/Chest: Effort normal and breath sounds normal. No respiratory distress. She has no wheezes. She has no rales.  Abdominal: Soft. She exhibits no distension. There is no tenderness. There is no guarding.  Genitourinary: Uterus is enlarged.  Small amt of vaginal blood noted,   Musculoskeletal: She exhibits no edema or tenderness.  No lower ext edema, no ankle swelling, no erythema or warmth. Full ROM of ankle, appears to have normal sensation and movement.   Unable to palpate bilateral LE pulses Unable to obtain doppler signal bilateral LE   Neurological: She is alert and oriented to person, place, and time.  Skin: Skin is warm and dry. No rash noted. She is not diaphoretic. No erythema.  Slightly darker, waxy appearance to RLE  Nursing note and vitals reviewed.   ED Course  Procedures (including critical care time) Labs Review Labs Reviewed  CBC WITH DIFFERENTIAL/PLATELET - Abnormal; Notable for the following:    WBC 13.5 (*)    RBC 1.83 (*)    Hemoglobin 5.2 (*)    HCT 16.5 (*)    Neutro Abs 11.6 (*)    All other components within normal limits  COMPREHENSIVE METABOLIC PANEL - Abnormal; Notable for the following:    Sodium 134 (*)    Potassium 5.8 (*)    CO2 13 (*)    Glucose, Bld 401 (*)    BUN 55 (*)    Creatinine, Ser 2.83 (*)    Calcium 8.7 (*)    Albumin 3.2 (*)    GFR calc non Af Amer 16 (*)    GFR calc Af Amer 18 (*)    Anion gap 16 (*)    All other components within normal limits  PROTIME-INR - Abnormal; Notable for the following:    Prothrombin Time 21.3 (*)    INR 1.85 (*)    All other components within normal limits  I-STAT CHEM 8, ED - Abnormal; Notable for the following:    Potassium 5.7 (*)    BUN 55 (*)    Creatinine, Ser 2.80 (*)    Glucose, Bld 374 (*)    Calcium, Ion 1.07 (*)    Hemoglobin 5.8 (*)    HCT 17.0 (*)    All other components within normal limits  I-STAT TROPOININ, ED  I-STAT  TROPOININ, ED  I-STAT CG4 LACTIC ACID, ED  TYPE AND SCREEN  PREPARE RBC (CROSSMATCH)  ABO/RH    Imaging Review Dg Chest Portable 1 View  08-23-15  CLINICAL DATA:  Acute onset of generalized weakness and anemia. Initial encounter. EXAM: PORTABLE CHEST 1 VIEW COMPARISON:  Chest  radiograph performed 07/16/2015 FINDINGS: The lungs are well-aerated. Mild vascular congestion is noted. There is no evidence of focal opacification, pleural effusion or pneumothorax. The cardiomediastinal silhouette is mildly enlarged. The patient is status post median sternotomy, with evidence of prior CABG. An AICD is noted overlying the left chest wall, with a single lead ending overlying the right ventricle. No acute osseous abnormalities are seen. IMPRESSION: Mild vascular congestion and mild cardiomegaly noted. Lungs remains grossly clear. Electronically Signed   By: Garald Balding M.D.   On: Jul 25, 2015 01:53   Dg Ankle Right Port  Jul 25, 2015  CLINICAL DATA:  Acute onset of right ankle pain and swelling. Weakness and anemia. Initial encounter. EXAM: PORTABLE RIGHT ANKLE - 2 VIEW COMPARISON:  Right ankle radiographs performed 04/19/2014 FINDINGS: There is no evidence of fracture or dislocation. The ankle mortise is intact; the interosseous space is within normal limits. No talar tilt or subluxation is seen. The joint spaces are preserved. Scattered vascular calcifications are seen. IMPRESSION: 1. No evidence of fracture or dislocation. 2. Scattered vascular calcifications seen. Electronically Signed   By: Garald Balding M.D.   On: Jul 25, 2015 01:52   I have personally reviewed and evaluated these images and lab results as part of my medical decision-making.   EKG Interpretation   Date/Time:  Sunday Jul 23 2015 22:34:15 EDT Ventricular Rate:  102 PR Interval:  207 QRS Duration: 106 QT Interval:  323 QTC Calculation: 421 R Axis:   81 Text Interpretation:  Sinus tachycardia Borderline prolonged PR interval   Abnormal lateral Q waves No significant change since last tracing  Confirmed by [place] ([id number]) on 07/25/15 2:15:08 AM       CRITICAL CARE: hemorrhagic shock, pulseless foot, hyperkalemia Performed by: Alvino Chapel   Total critical care time: 45 minutes  Critical care time was exclusive of separately billable procedures and treating other patients.  Critical care was necessary to treat or prevent imminent or life-threatening deterioration.  Critical care was time spent personally by me on the following activities: development of treatment plan with patient and/or surrogate as well as nursing, discussions with consultants, evaluation of patient's response to treatment, examination of patient, obtaining history from patient or surrogate, ordering and performing treatments and interventions, ordering and review of laboratory studies, ordering and review of radiographic studies, pulse oximetry and re-evaluation of patient's condition.   MDM   Final diagnoses:  Pain  Postmenopausal vaginal bleeding  Anemia associated with acute blood loss  Pain in right lower leg  Peripheral vascular disease (HCC) with concern for acute right limb ischemia  Other specified hypotension  Chest pain, unspecified chest pain type  Acute-on-chronic kidney injury [facility])  Hyperkalemia   70 year old female with a history of coronary artery disease status post CABG, ischemic cardiomyopathy, hypertension, hyperlipidemia, diabetes, ischemic colitis, emergency department visit with admission to women's one week ago for significant vaginal bleeding and hemoglobin dropped from 10-8. Ultrasound at that time showed possible blood clot versus endometrial mass, with plan for patient to continue Megace and follow-up as an outpatient. Patient reports continuing vaginal bleeding since this time, however it has decreased. Patient's major concern today is right foot pain.  Pt with inconsolable right  foot pain which began today, concerning for ischemia.  No swelling of RLE and doubt DVT. No sign of cellulitis/ankle swelling/septic arthritis/gout.  Unfortunately, pt blood pressures too low on presentation for use of narcotic pain medications.  On my exam, unable to palpate a pulse or Doppler a signal  on the right lower extremity, or left lower extremity, however this is in the setting of significant anemia with a hemoglobin of 5.2 and blood pressures in the [id number] and 0000000 systolic, and suspect right lower extremity has ischemic pain secondary to anemia and low blood flow with peripheral vascular disease.   Consulted vascular surgery, Dr. Donnetta Hutching, who came to the bedside and evaluated the patient and found dampened left DP signal on doppler and no signal in right foot.  Agrees with resuscitation of blood pressure and anemia, and will reevaluate in the morning.  Keep pt NPO at this time.  Regarding patient's anemia.  This is likely secondary to vaginal bleeding.  Patient with decreased bleeding today on history and on exam, has no active bleeding from the os and easily visualized cervix. Given no significant continuing hemorrhage and other medical issues, feel care at [facility] is appropriate.  Discussed with Dr. Kennon Rounds at [facility] who reports they will officially consult in the AM, however are available if clinical picture changes and overall recommend GYN Oncology consult given concern for endometrial malignancy.    Pt also reports CP-ECG without changes, no sig findings on CXR, troponin negative.   Pt with acute on chronic kidney injury and hyperkalemia.  Given insulin/dextrose. No hyperkalemic ECG changes.  Pt given normal saline and 3U of pRBC ordered.  Will admit to stepdown for continued care.      Gareth Morgan, MD August 22, 2015 [phone]

## 2015-07-24 ENCOUNTER — Emergency Department (HOSPITAL_COMMUNITY): Payer: BLUE CROSS/BLUE SHIELD

## 2015-07-24 DIAGNOSIS — E785 Hyperlipidemia, unspecified: Secondary | ICD-10-CM

## 2015-07-24 DIAGNOSIS — N184 Chronic kidney disease, stage 4 (severe): Secondary | ICD-10-CM

## 2015-07-24 DIAGNOSIS — N950 Postmenopausal bleeding: Secondary | ICD-10-CM | POA: Diagnosis not present

## 2015-07-24 DIAGNOSIS — Z794 Long term (current) use of insulin: Secondary | ICD-10-CM

## 2015-07-24 DIAGNOSIS — E1165 Type 2 diabetes mellitus with hyperglycemia: Secondary | ICD-10-CM

## 2015-07-24 DIAGNOSIS — I272 Other secondary pulmonary hypertension: Secondary | ICD-10-CM | POA: Diagnosis present

## 2015-07-24 DIAGNOSIS — Z66 Do not resuscitate: Secondary | ICD-10-CM | POA: Diagnosis present

## 2015-07-24 DIAGNOSIS — Z833 Family history of diabetes mellitus: Secondary | ICD-10-CM | POA: Diagnosis not present

## 2015-07-24 DIAGNOSIS — E875 Hyperkalemia: Secondary | ICD-10-CM | POA: Diagnosis present

## 2015-07-24 DIAGNOSIS — N179 Acute kidney failure, unspecified: Secondary | ICD-10-CM

## 2015-07-24 DIAGNOSIS — D62 Acute posthemorrhagic anemia: Secondary | ICD-10-CM | POA: Diagnosis present

## 2015-07-24 DIAGNOSIS — I25810 Atherosclerosis of coronary artery bypass graft(s) without angina pectoris: Secondary | ICD-10-CM | POA: Diagnosis present

## 2015-07-24 DIAGNOSIS — E1151 Type 2 diabetes mellitus with diabetic peripheral angiopathy without gangrene: Secondary | ICD-10-CM | POA: Diagnosis present

## 2015-07-24 DIAGNOSIS — M79671 Pain in right foot: Secondary | ICD-10-CM | POA: Diagnosis present

## 2015-07-24 DIAGNOSIS — Z8249 Family history of ischemic heart disease and other diseases of the circulatory system: Secondary | ICD-10-CM | POA: Diagnosis not present

## 2015-07-24 DIAGNOSIS — I5042 Chronic combined systolic (congestive) and diastolic (congestive) heart failure: Secondary | ICD-10-CM | POA: Diagnosis present

## 2015-07-24 DIAGNOSIS — Z808 Family history of malignant neoplasm of other organs or systems: Secondary | ICD-10-CM | POA: Diagnosis not present

## 2015-07-24 DIAGNOSIS — Z951 Presence of aortocoronary bypass graft: Secondary | ICD-10-CM | POA: Diagnosis not present

## 2015-07-24 DIAGNOSIS — I255 Ischemic cardiomyopathy: Secondary | ICD-10-CM | POA: Diagnosis present

## 2015-07-24 DIAGNOSIS — I959 Hypotension, unspecified: Secondary | ICD-10-CM

## 2015-07-24 DIAGNOSIS — I480 Paroxysmal atrial fibrillation: Secondary | ICD-10-CM

## 2015-07-24 DIAGNOSIS — E1122 Type 2 diabetes mellitus with diabetic chronic kidney disease: Secondary | ICD-10-CM

## 2015-07-24 DIAGNOSIS — I130 Hypertensive heart and chronic kidney disease with heart failure and stage 1 through stage 4 chronic kidney disease, or unspecified chronic kidney disease: Secondary | ICD-10-CM | POA: Diagnosis present

## 2015-07-24 DIAGNOSIS — Z9861 Coronary angioplasty status: Secondary | ICD-10-CM | POA: Diagnosis not present

## 2015-07-24 DIAGNOSIS — I252 Old myocardial infarction: Secondary | ICD-10-CM | POA: Diagnosis not present

## 2015-07-24 DIAGNOSIS — I998 Other disorder of circulatory system: Secondary | ICD-10-CM

## 2015-07-24 DIAGNOSIS — Z888 Allergy status to other drugs, medicaments and biological substances status: Secondary | ICD-10-CM | POA: Diagnosis not present

## 2015-07-24 DIAGNOSIS — E861 Hypovolemia: Secondary | ICD-10-CM | POA: Diagnosis present

## 2015-07-24 DIAGNOSIS — Z87891 Personal history of nicotine dependence: Secondary | ICD-10-CM | POA: Diagnosis not present

## 2015-07-24 DIAGNOSIS — Z95810 Presence of automatic (implantable) cardiac defibrillator: Secondary | ICD-10-CM | POA: Diagnosis not present

## 2015-07-24 DIAGNOSIS — N938 Other specified abnormal uterine and vaginal bleeding: Secondary | ICD-10-CM | POA: Diagnosis present

## 2015-07-24 DIAGNOSIS — I9589 Other hypotension: Secondary | ICD-10-CM | POA: Diagnosis present

## 2015-07-24 DIAGNOSIS — Z515 Encounter for palliative care: Secondary | ICD-10-CM | POA: Diagnosis present

## 2015-07-24 LAB — COMPREHENSIVE METABOLIC PANEL
ALT: 19 U/L (ref 14–54)
AST: 33 U/L (ref 15–41)
Albumin: 3.2 g/dL — ABNORMAL LOW (ref 3.5–5.0)
Alkaline Phosphatase: 93 U/L (ref 38–126)
Anion gap: 16 — ABNORMAL HIGH (ref 5–15)
BUN: 55 mg/dL — AB (ref 6–20)
CHLORIDE: 105 mmol/L (ref 101–111)
CO2: 13 mmol/L — AB (ref 22–32)
CREATININE: 2.83 mg/dL — AB (ref 0.44–1.00)
Calcium: 8.7 mg/dL — ABNORMAL LOW (ref 8.9–10.3)
GFR calc Af Amer: 18 mL/min — ABNORMAL LOW (ref >60)
GFR calc non Af Amer: 16 mL/min — ABNORMAL LOW (ref >60)
Glucose, Bld: 401 mg/dL — ABNORMAL HIGH (ref 65–99)
POTASSIUM: 5.8 mmol/L — AB (ref 3.5–5.1)
SODIUM: 134 mmol/L — AB (ref 135–145)
Total Bilirubin: 1.1 mg/dL (ref 0.3–1.2)
Total Protein: 6.7 g/dL (ref 6.5–8.1)

## 2015-07-24 LAB — PREPARE RBC (CROSSMATCH)

## 2015-07-24 LAB — ABO/RH: ABO/RH(D): B POS

## 2015-07-24 LAB — MRSA PCR SCREENING: MRSA BY PCR: NEGATIVE

## 2015-07-24 LAB — CBG MONITORING, ED: GLUCOSE-CAPILLARY: 388 mg/dL — AB (ref 65–99)

## 2015-07-24 MED ORDER — SODIUM CHLORIDE 0.9% FLUSH
3.0000 mL | Freq: Two times a day (BID) | INTRAVENOUS | Status: DC
  Administered 2015-07-24: 3 mL via INTRAVENOUS

## 2015-07-24 MED ORDER — IPRATROPIUM BROMIDE 0.02 % IN SOLN: 0.5000 mg | RESPIRATORY_TRACT | Status: DC | PRN

## 2015-07-24 MED ORDER — ATORVASTATIN CALCIUM 40 MG PO TABS: 40.0000 mg | ORAL_TABLET | Freq: Every day | ORAL | Status: DC

## 2015-07-24 MED ORDER — LORAZEPAM 2 MG/ML IJ SOLN
0.2500 mg | INTRAMUSCULAR | Status: AC
  Administered 2015-07-24: 0.25 mg via INTRAVENOUS
  Filled 2015-07-24: qty 1

## 2015-07-24 MED ORDER — MORPHINE SULFATE (PF) 2 MG/ML IV SOLN
1.0000 mg | INTRAVENOUS | Status: DC | PRN
  Administered 2015-07-24: 2 mg via INTRAVENOUS
  Filled 2015-07-24: qty 1

## 2015-07-24 MED ORDER — ONDANSETRON HCL 4 MG PO TABS: 4.0000 mg | ORAL_TABLET | Freq: Four times a day (QID) | ORAL | Status: DC | PRN

## 2015-07-24 MED ORDER — SODIUM CHLORIDE 0.9 % IV SOLN
1.0000 g | Freq: Once | INTRAVENOUS | Status: AC
  Administered 2015-07-24: 1 g via INTRAVENOUS
  Filled 2015-07-24: qty 10

## 2015-07-24 MED ORDER — DIGOXIN 0.0625 MG HALF TABLET
0.0625 mg | ORAL_TABLET | Freq: Every day | ORAL | Status: DC
  Filled 2015-07-24: qty 1

## 2015-07-24 MED ORDER — DEXTROSE 50 % IV SOLN
1.0000 | Freq: Once | INTRAVENOUS | Status: AC
  Administered 2015-07-24: 50 mL via INTRAVENOUS
  Filled 2015-07-24: qty 50

## 2015-07-24 MED ORDER — INSULIN ASPART 100 UNIT/ML ~~LOC~~ SOLN
0.0000 [IU] | SUBCUTANEOUS | Status: DC
  Administered 2015-07-24: 15 [IU] via SUBCUTANEOUS
  Filled 2015-07-24: qty 1

## 2015-07-24 MED ORDER — HYDROCODONE-ACETAMINOPHEN 5-325 MG PO TABS
1.0000 | ORAL_TABLET | ORAL | Status: DC | PRN
Start: 2015-07-24 — End: 2015-07-24

## 2015-07-24 MED ORDER — FUROSEMIDE 10 MG/ML IJ SOLN: 20.0000 mg | Freq: Once | INTRAMUSCULAR | Status: DC

## 2015-07-24 MED ORDER — ONDANSETRON HCL 4 MG/2ML IJ SOLN: 4.0000 mg | Freq: Four times a day (QID) | INTRAMUSCULAR | Status: DC | PRN

## 2015-07-24 MED ORDER — MEGESTROL ACETATE 40 MG PO TABS
40.0000 mg | ORAL_TABLET | Freq: Every day | ORAL | Status: DC
  Filled 2015-07-24: qty 1

## 2015-07-24 MED ORDER — ALBUTEROL SULFATE (2.5 MG/3ML) 0.083% IN NEBU
2.5000 mg | INHALATION_SOLUTION | RESPIRATORY_TRACT | Status: DC | PRN
Start: 2015-07-24 — End: 2015-07-24

## 2015-07-24 MED ORDER — SODIUM CHLORIDE 0.9 % IV SOLN
10.0000 mL/h | Freq: Once | INTRAVENOUS | Status: AC
  Administered 2015-07-24: 10 mL/h via INTRAVENOUS

## 2015-07-24 MED ORDER — INSULIN GLARGINE 100 UNIT/ML ~~LOC~~ SOLN
25.0000 [IU] | Freq: Every day | SUBCUTANEOUS | Status: DC
  Filled 2015-07-24: qty 0.25

## 2015-07-24 MED ORDER — INSULIN ASPART 100 UNIT/ML ~~LOC~~ SOLN
10.0000 [IU] | Freq: Once | SUBCUTANEOUS | Status: AC
  Administered 2015-07-24: 10 [IU] via INTRAVENOUS
  Filled 2015-07-24: qty 1

## 2015-07-25 LAB — TYPE AND SCREEN
ABO/RH(D): B POS
ANTIBODY SCREEN: NEGATIVE
UNIT DIVISION: 0
UNIT DIVISION: 0
Unit division: 0

## 2015-07-27 NOTE — H&P (Signed)
History and Physical    Jo Lynn [id number] DOB: 1945-05-07 DOA: 06/30/2015  Referring MD/NP/PA: Dr. Billy Fischer PCP: Jo Pretty, FNP  Patient coming from: Home   Chief Complaint: Vaginal bleeding, chest pain, and right foot pain  HPI: Jo Lynn is a 70 y.o. female with history of CAD s/p CABG, combined CHF EF 20-25%, HTN, HLD, DM2, ischemic colitis, PVD; who presents with complaints of a one-week history of vaginal bleeding. Patient she had reported acute onset of chest pain and right foot pain starting yesterday. History is obtained from the patient's daughter daughter as she is acutely distressed and unable to provide her own history at this time. Patient was just seen at [facility] 1 week ago for vaginal bleedingin which she underwent a ultrasound.  Ultrasound revealed possibility of a mass versus clot. She was started on Megacethe help see if this would stop bleeding. Baseline hemoglobin had been around  around 10,  but had dropped to 8.3during her evaluation there on 5/21. Patient  was not transfused any blood and discharged home. Patient had continued to take her Eliquis for history of atrial fibrillation.   ED Course:  Upon admission into the emergency department patient was found to be temperatures as low as 96.43F, pulse a as low as 38, respirations up to 33, blood pressure as low as 90/57, O2 saturations 100%. Initial lab work revealed WBC of 13.5, hemoglobin 5.2, potassium 5.8, CO2 13, BUN 55, creatinine 2.83, calcium 8.7, glucose 401, INR 1.85. Chest x-ray revealed mild vascular pulmonary congestion and cardiomegaly. Patient was type and cross and ordered to be transfused 3 units. Vascular surgery was also consulted for decreased pulses of the right foot and recommended correction of anemia.  Review of Systems: As per HPI otherwise 10 point review of systems negative.   Past Medical History  Diagnosis Date  . Myocardial infarction ([place]) 10/2002    a. 2004  - MV CAD --> Referred for CABG.  . 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 s/p CABGx4 in 2004 with LIMA-LAD, SVG-RPDA, SVG-OM1-OM2. b. Inferior STEMI 08/2013 s/p BMS to occluded SVG-OM1-OM2, and Unsuccessful attempt at [facility] on proximal 100% SVG-RCA. c. Inferoposterior STEMI 05/2014 s/p Scoring balloon PTCA to dSVG-Cx.  . 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 Lucianne Lei Trigt: LIMA-LAD, SVG-RPDA, SVG-OM1-OM2  . Cardiomyopathy, ischemic   . Combined systolic and diastolic congestive heart failure, NYHA class 3 ([place])   . Essential hypertension   . Hyperlipidemia with target LDL less than 70   . Diabetes mellitus type 2 with complications ([place])   . 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 Lovena Le  . Colitis   . Cardiogenic shock ([place])     a. Initiated on milrinone 09/2014.  . Ischemic colitis ([place]) 09/2014  . CKD (chronic kidney disease), stage IV ([place])   . Transaminitis   . Mild pulmonary hypertension ([place])     a. by [place] 09/2014.    Past Surgical History  Procedure Laterality Date  . Coronary artery bypass graft    . Left heart catheterization with coronary angiogram N/A 09/09/2013    Procedure: LEFT HEART CATHETERIZATION WITH CORONARY ANGIOGRAM;  Surgeon: Leonie Man, MD;  Location: [facility] CATH LAB;  Service: Cardiovascular;  Laterality: N/A;  . Implantable cardioverter defibrillator implant  02-21-2014    STJ single chamber ICD implanted by Dr Lovena Le for primary prevention  . Implantable cardioverter defibrillator implant N/A 02/21/2014    Procedure: IMPLANTABLE CARDIOVERTER DEFIBRILLATOR  IMPLANT;  Surgeon: Evans Lance, MD;  Location: [facility] CATH LAB;  Service: Cardiovascular;  Laterality: N/A;  . Left heart catheterization with coronary angiogram N/A 06/13/2014    Procedure: LEFT HEART CATHETERIZATION WITH CORONARY ANGIOGRAM;  Surgeon: Jettie Booze, MD;  Location: [facility] CATH LAB;  Service: Cardiovascular;  Laterality: N/A;  . Cardiac catheterization N/A 10/04/2014      Procedure: Right Heart Cath;  Surgeon: Jolaine Artist, MD;  Location: [place] CV LAB;  Service: Cardiovascular;  Laterality: N/A;     reports that she quit smoking about 17 years ago. Her smoking use included Cigarettes. She has never used smokeless tobacco. She reports that she does not drink alcohol or use illicit drugs.  Allergies  Allergen Reactions  . Prednisone Other (See Comments)    "Did not feel good at all"    Family History  Problem Relation Age of Onset  . Cancer Mother     bone  . Heart disease Father   . Heart attack Neg Hx   . Stroke Neg Hx   . Diabetes Sister     Prior to Admission medications   Medication Sig Start Date End Date Taking? Authorizing Provider  allopurinol (ZYLOPRIM) 100 MG tablet Take 0.5 tablets (50 mg total) by mouth 2 (two) times daily. 06/08/15  Yes Jo Hassell Done, FNP  apixaban (ELIQUIS) 5 MG TABS tablet Take 1 tablet (5 mg total) by mouth 2 (two) times daily. 06/08/15  Yes Jo Hassell Done, FNP  aspirin EC 81 MG tablet Take 1 tablet (81 mg total) by mouth daily. 08/03/14  Yes Minus Breeding, MD  atorvastatin (LIPITOR) 40 MG tablet Take 1 tablet (40 mg total) by mouth at bedtime. 06/08/15  Yes Jo Hassell Done, FNP  carvedilol (COREG) 6.25 MG tablet Take 1 tablet (6.25 mg total) by mouth 2 (two) times daily with a meal. 06/08/15  Yes Jo Hassell Done, FNP  digoxin (LANOXIN) 0.125 MG tablet Take 0.5 tablets (0.0625 mg total) by mouth daily. 06/08/15  Yes Jo Hassell Done, FNP  furosemide (LASIX) 40 MG tablet Take 1 tablet (40 mg total) by mouth 2 (two) times daily. 06/08/15  Yes Jo Hassell Done, FNP  glimepiride (AMARYL) 2 MG tablet TAKE ONE TABLET BY MOUTH ONCE DAILY WITH BREAKFAST 06/08/15  Yes Jo Hassell Done, FNP  Insulin Glargine (LANTUS SOLOSTAR) 100 UNIT/ML Solostar Pen Inject up to 30 units once daily Patient taking differently: Inject 25 Units into the skin daily at 10 pm.  06/08/15  Yes Jo  Hassell Done, FNP  Insulin Pen Needle 32G X 4 MM MISC Use with Basaglar insulin pen to inject once daily 03/06/15  Yes Tammy Eckard, PHARMD  isosorbide-hydrALAZINE (BIDIL) 20-37.5 MG tablet Take 1.5 tablets by mouth 3 (three) times daily. 06/08/15  Yes Jo Hassell Done, FNP  KLOR-CON M20 20 MEQ tablet Take 1 tablet (20 mEq total) by mouth daily. 06/08/15  Yes Jo Hassell Done, FNP  LANTUS SOLOSTAR 100 UNIT/ML Solostar Pen INJECT UP TO 20 UNITS ONCE DAILY 06/29/15  Yes Jo Hassell Done, FNP  megestrol (MEGACE) 40 MG tablet Take 1 tablet (40 mg total) by mouth daily. 07/16/15  Yes Jo Kind, MD  spironolactone (ALDACTONE) 25 MG tablet Take 1 tablet (25 mg total) by mouth every other day. 06/08/15  Yes Jo Hassell Done, FNP    Physical Exam: Filed Vitals:   06/27/2015 2300 07/26/2015 2315 25-Jul-2015 0030 Jul 25, 2015 0045  BP: 90/57 100/59 97/67 94/68   Pulse: 102  81   Temp:   97.3 F (36.3 C)   TempSrc:  Oral   Resp: 37 23 13 25   Height:      Weight:      SpO2: 100%  100%       Constitutional: Elderly female in moderate distress moaning in pain Filed Vitals:   07/22/2015 2300 07/06/2015 2315 2015/08/02 0030 2015/08/02 0045  BP: 90/57 100/59 97/67 94/68   Pulse: 102  81   Temp:   97.3 F (36.3 C)   TempSrc:   Oral   Resp: 37 23 13 25   Height:      Weight:      SpO2: 100%  100%    Eyes: PERRL, lids and conjunctivae normal ENMT: Mucous membranes are Dry. Posterior pharynx clear of any exudate or lesions. Neck: normal, supple, no masses, no thyromegaly Respiratory: Tachypneic with mild bibasilar crackles appreciated, no wheezing, no crackles.  No accessory muscle use.  Cardiovascular: Bradycardic,  no murmurs / rubs / gallops. No extremity edema. 0-1+ pedal pulses noted on right lower extremity 1+ on left lower extremity . No carotid bruits.  Abdomen: no tenderness, no masses palpated. No hepatosplenomegaly. Bowel sounds positive.  Musculoskeletal: no clubbing / cyanosis. No joint  deformity upper and lower extremities. Good ROM, no contractures. Normal muscle tone.  Skin: no rashes, lesions, ulcers. No induration Neurologic: CN 2-12 grossly intact. Sensation intact, DTR normal. Strength 5/5 in all 4.  Psychiatric: Normal judgment and insight. Alert. Anxious mood     Labs on Admission: I have personally reviewed following labs and imaging studies  CBC:  Recent Labs Lab 07/20/2015 2333 07/21/2015 2341  WBC 13.5*  --   NEUTROABS 11.6*  --   HGB 5.2* 5.8*  HCT 16.5* 17.0*  MCV 90.2  --   PLT 343  --    Basic Metabolic Panel:  Recent Labs Lab 07/16/2015 2333 07/04/2015 2341  NA 134* 136  K 5.8* 5.7*  CL 105 104  CO2 13*  --   GLUCOSE 401* 374*  BUN 55* 55*  CREATININE 2.83* 2.80*  CALCIUM 8.7*  --    GFR: Estimated Creatinine Clearance: 20.5 mL/min (by C-G formula based on Cr of 2.8). Liver Function Tests:  Recent Labs Lab 07/16/2015 2333  AST 33  ALT 19  ALKPHOS 93  BILITOT 1.1  PROT 6.7  ALBUMIN 3.2*   No results for input(s): LIPASE, AMYLASE in the last 168 hours. No results for input(s): AMMONIA in the last 168 hours. Coagulation Profile:  Recent Labs Lab 07/23/15 2333  INR 1.85*   Cardiac Enzymes: No results for input(s): CKTOTAL, CKMB, CKMBINDEX, TROPONINI in the last 168 hours. BNP (last 3 results) No results for input(s): PROBNP in the last 8760 hours. HbA1C: No results for input(s): HGBA1C in the last 72 hours. CBG: No results for input(s): GLUCAP in the last 168 hours. Lipid Profile: No results for input(s): CHOL, HDL, LDLCALC, TRIG, CHOLHDL, LDLDIRECT in the last 72 hours. Thyroid Function Tests: No results for input(s): TSH, T4TOTAL, FREET4, T3FREE, THYROIDAB in the last 72 hours. Anemia Panel: No results for input(s): VITAMINB12, FOLATE, FERRITIN, TIBC, IRON, RETICCTPCT in the last 72 hours. Urine analysis:    Component Value Date/Time   COLORURINE YELLOW 07/16/2015 0809   APPEARANCEUR CLOUDY* 07/16/2015 0809    LABSPEC 1.010 07/16/2015 0809   PHURINE 5.0 07/16/2015 0809   GLUCOSEU NEGATIVE 07/16/2015 0809   HGBUR MODERATE* 07/16/2015 0809   BILIRUBINUR NEGATIVE 07/16/2015 0809   BILIRUBINUR neg 03/06/2015 1455   KETONESUR NEGATIVE 07/16/2015 0809   PROTEINUR NEGATIVE 07/16/2015 0809  PROTEINUR neg 03/06/2015 1455   UROBILINOGEN negative 03/06/2015 1455   UROBILINOGEN 0.2 09/28/2014 0605   NITRITE NEGATIVE 07/16/2015 0809   NITRITE neg 03/06/2015 1455   LEUKOCYTESUR SMALL* 07/16/2015 0809   Sepsis Labs: No results found for this or any previous visit (from the past 240 hour(s)).   Radiological Exams on Admission: No results found.  EKG: Independently reviewed. Sinus tachycardia with borderline prolonged PR interval  Assessment/Plan Acute blood loss anemia secondary to vaginal bleeding: Acute. One week of vaginal bleeding mildly improved after the issue now Megace. Respiratory pressure bleeding include Eliquis - Admit to stepdown - Continue transfusing 3 units of PRBC  Goal hemoglobin at least 8 g/dL due to cardiac history - Lasix 20mg  IV  x1 following  transfusion of 2nd unit of packed red blood cells, will closely monitor and give Lasix as needed - Continue Megace - OB/GYN Dr. Kennon Rounds consulted and they will see in a.m. follow-up recommendations  Hyperkalemia: Potassium 5.8 on admission. Given dextrose 50% and 10 units of insulin in the ED - Held scheduled Klor-con - Follow-up repeat BMP   Combined systolic and diastolic CHF/ s/p AICD: last EF 20-25% with grade 3 diastolic diastolic dysfunction - Held Coreg, spironolactone, po lasix, Bidil 2/2 hypotension restart when able  Hypotension: Acute. Suspect secondary to acute anemia. - Transfusing blood to improve blood pressures  - We'll give IV fluids if necessary but do not want to put into pulmonary edema   Paroxysmal Atrial fibrillation on chronic anticoagulation therapy: Chadvasc 7 - Continue digoxin - Holding Eliquis for now  secondary to active bleeding   Diabetes mellitus type 2 with hyperglycemia: Last hemoglobin A1c 11.4 and 02/2015. Blood glucose as high as 401 on admission. - held amaryl - continue home lantus regimen of 20 units qHS - CBGs every 4 hours with a moderate sliding scale insulin  Hypocalcemia: Ionized calcium on admission 1.07 - Transfuse 1 g of calcium gluconate - Check ionized calcium in a.m. and replace as needed  Right foot pain - hydrocodone prn pain - Morphine discontinued secondary to bradycardia - Vascular surgery following  Hyperlipidemia - Continue atorvastatin  DVT prophylaxis:  SCD Code Status: DNR Family Communication:  Discussed plan with patient's daughter present at bedside Disposition Plan:     Consults called: Dr. Sherren Mocha early,Vascular surgery Admission status: Inpatient   Norval Morton MD [facility] Pager [phone]  If 7PM-7AM, please contact night-coverage [email or web address] Password [email or web address]  08-01-2015, 1:27 AM      cc liquid

## 2015-07-27 NOTE — ED Notes (Signed)
Attempted report 

## 2015-07-27 NOTE — Consult Note (Signed)
Patient name: Jo Lynn MRN: [id number] DOB: Apr 21, 1945 Sex: female   Referred by: EDP  Reason for referral:  Chief Complaint  Patient presents with  . Vaginal Bleeding  . Chest Pain  . Foot Pain    HISTORY OF PRESENT ILLNESS: Very complex 70 year old female presents this evening for several complaints. She has had had ongoing vaginal bleeding and also chest pain. Also reports right foot pain. Difficult to obtain history. Her daughter is present to is able to give most of the history. She reports that she has had pain for quite some time reports that this is been since she had injections one year ago for gout. She also reports that all of her discomfort started at the time of pelvic exam a week ago. She then reports that pain in her right foot started today and is more severe. She denies any discomfort in her calf. He has no history of tissue loss. She does have known documented peripheral vascular occlusive disease which is chronic. Ankle arm index at [facility]. 2016 revealed normal ankle arm index of 0.7 on the right and 0.8 on the left. In reviewing CT scan from August 2016 for other causes she has extensive aortoiliac Calcification and occlusive disease. She is status post coronary artery bypass grafting 13 years ago with chronic congestive heart failure. She does have renal insufficiency. Stage IV.  Past Medical History  Diagnosis Date  . Myocardial infarction ([place]) 10/2002    a. 2004 - MV CAD --> Referred for CABG.  . 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 s/p CABGx4 in 2004 with LIMA-LAD, SVG-RPDA, SVG-OM1-OM2. b. Inferior STEMI 08/2013 s/p BMS to occluded SVG-OM1-OM2, and Unsuccessful attempt at [facility] on proximal 100% SVG-RCA. c. Inferoposterior STEMI 05/2014 s/p Scoring balloon PTCA to dSVG-Cx.  . 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 Lucianne Lei Trigt: LIMA-LAD, SVG-RPDA, SVG-OM1-OM2  . Cardiomyopathy, ischemic   . Combined systolic and diastolic congestive heart  failure, NYHA class 3 ([place])   . Essential hypertension   . Hyperlipidemia with target LDL less than 70   . Diabetes mellitus type 2 with complications ([place])   . 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 Lovena Le  . Colitis   . Cardiogenic shock ([place])     a. Initiated on milrinone 09/2014.  . Ischemic colitis ([place]) 09/2014  . CKD (chronic kidney disease), stage IV ([place])   . Transaminitis   . Mild pulmonary hypertension ([place])     a. by [place] 09/2014.    Past Surgical History  Procedure Laterality Date  . Coronary artery bypass graft    . Left heart catheterization with coronary angiogram N/A 09/09/2013    Procedure: LEFT HEART CATHETERIZATION WITH CORONARY ANGIOGRAM;  Surgeon: Leonie Man, MD;  Location: [facility] CATH LAB;  Service: Cardiovascular;  Laterality: N/A;  . Implantable cardioverter defibrillator implant  02-21-2014    STJ single chamber ICD implanted by Dr Lovena Le for primary prevention  . Implantable cardioverter defibrillator implant N/A 02/21/2014    Procedure: IMPLANTABLE CARDIOVERTER DEFIBRILLATOR IMPLANT;  Surgeon: Evans Lance, MD;  Location: [facility] CATH LAB;  Service: Cardiovascular;  Laterality: N/A;  . Left heart catheterization with coronary angiogram N/A 06/13/2014    Procedure: LEFT HEART CATHETERIZATION WITH CORONARY ANGIOGRAM;  Surgeon: Jettie Booze, MD;  Location: [facility] CATH LAB;  Service: Cardiovascular;  Laterality: N/A;  . Cardiac catheterization N/A 10/04/2014    Procedure: Right  Heart Cath;  Surgeon: Jolaine Artist, MD;  Location: [place] CV LAB;  Service: Cardiovascular;  Laterality: N/A;    Social History   Social History  . Marital Status: Single    Spouse Name: N/A  . Number of Children: 4  . Years of Education: N/A   Occupational History  . Not on file.   Social History Main Topics  . Smoking status: Former Smoker    Types: Cigarettes    Quit date: 02/25/1998  . Smokeless tobacco: Never Used      Comment: quit many years ago  . Alcohol Use: No  . Drug Use: No  . Sexual Activity: Not on file   Other Topics Concern  . Not on file   Social History Narrative    Family History  Problem Relation Age of Onset  . Cancer Mother     bone  . Heart disease Father   . Heart attack Neg Hx   . Stroke Neg Hx   . Diabetes Sister     Allergies as of 07/01/2015 - Review Complete 07/26/2015  Allergen Reaction Noted  . Prednisone Other (See Comments) 01/10/2015    No current facility-administered medications on file prior to encounter.   Current Outpatient Prescriptions on File Prior to Encounter  Medication Sig Dispense Refill  . allopurinol (ZYLOPRIM) 100 MG tablet Take 0.5 tablets (50 mg total) by mouth 2 (two) times daily.    Marland Kitchen apixaban (ELIQUIS) 5 MG TABS tablet Take 1 tablet (5 mg total) by mouth 2 (two) times daily. 180 tablet 0  . aspirin EC 81 MG tablet Take 1 tablet (81 mg total) by mouth daily.    Marland Kitchen atorvastatin (LIPITOR) 40 MG tablet Take 1 tablet (40 mg total) by mouth at bedtime. 90 tablet 0  . carvedilol (COREG) 6.25 MG tablet Take 1 tablet (6.25 mg total) by mouth 2 (two) times daily with a meal. 60 tablet 6  . digoxin (LANOXIN) 0.125 MG tablet Take 0.5 tablets (0.0625 mg total) by mouth daily. 45 tablet 3  . furosemide (LASIX) 40 MG tablet Take 1 tablet (40 mg total) by mouth 2 (two) times daily. 30 tablet 5  . glimepiride (AMARYL) 2 MG tablet TAKE ONE TABLET BY MOUTH ONCE DAILY WITH BREAKFAST 30 tablet 5  . Insulin Glargine (LANTUS SOLOSTAR) 100 UNIT/ML Solostar Pen Inject up to 30 units once daily (Patient taking differently: Inject 25 Units into the skin daily at 10 pm. ) 5 pen 1  . Insulin Pen Needle 32G X 4 MM MISC Use with Basaglar insulin pen to inject once daily 100 each 1  . isosorbide-hydrALAZINE (BIDIL) 20-37.5 MG tablet Take 1.5 tablets by mouth 3 (three) times daily. 135 tablet 6  . KLOR-CON M20 20 MEQ tablet Take 1 tablet (20 mEq total) by mouth daily. 30  tablet 0  . LANTUS SOLOSTAR 100 UNIT/ML Solostar Pen INJECT UP TO 20 UNITS ONCE DAILY 15 mL 1  . megestrol (MEGACE) 40 MG tablet Take 1 tablet (40 mg total) by mouth daily. 30 tablet 0  . spironolactone (ALDACTONE) 25 MG tablet Take 1 tablet (25 mg total) by mouth every other day. 30 tablet 5     REVIEW OF SYSTEMS  Nothing to add aside from extensive past history above Does have ongoing vaginal bleeding with a very large uterus with fibroids present  PHYSICAL EXAMINATION:  General: The patient is a well-nourished female, in no acute distress. Vital signs are BP 94/68 mmHg  Pulse 81  Temp(Src) 97.3 F (36.3 C) (Oral)  Resp 25  Ht 5\' 7"  (1.702 m)  Wt 184 lb (83.462 kg)  BMI 28.81 kg/m2  SpO2 100% Pulmonary: There is a good air  Abdomen: Soft and non-tender Musculoskeletal: There are no major deformities.   Neurologic: She does have sensory 5 function in both feet. She does have motor function in both feet. Perhaps somewhat diminished sensory function but this is difficult to determine. Skin: There are no ulcer or rashes noted. Psychiatric: The patient has normal affect. Cardiovascular: 2+ femoral pulses bilaterally. Absent popliteal and distal pulses bilaterally She does have audible Doppler flow which is dampened in her left dorsalis pedis and posterior tibial. On the right she has audible popliteal signal and no signals in her foot. Left foot lightly warmer than her right    Impression and Plan:  Right foot pain with an audible flow. Does have motor and sensory function. Hemoglobin down to 5 with ongoing vaginal bleeding. Stage IV kidney disease with a creatinine of 2.83. Discussed with the patient and her family present. No need for emergent surgery at this time. She does have normal femoral pulse. It is possible this is all chronic and her pain is related to profound anemia and hypotension. Agree with resuscitation with transfusion. Will follow closely. May need to proceed  with arteriography for further evaluation but explained this would put her at high risk for end-stage renal disease.    Curt Jews [facility] [place] [facility] Office: [phone]

## 2015-07-27 NOTE — Progress Notes (Signed)
   07-25-2015 1300  Clinical Encounter Type  Visited With Family  Visit Type Death  Referral From Nurse  Spiritual Encounters  Spiritual Needs Prayer  [place] asked to prayer with family prior to their departure from floor. Gwynn Burly 1:12 PM

## 2015-07-27 NOTE — Discharge Summary (Signed)
Death Summary  Jo Lynn [id number] DOB: March 21, 1945 DOA: Aug 03, 2015  PCP: Chevis Pretty, FNP  Admit date: 08-03-2015 Date of Death: 08-04-2015  Final Diagnoses:  Dysfunctional uterine bleeding  Acute blood loss anemia Hyperkalemia CAD s/p CABG x4 2004 w/ subsequent graft occlusions and PTCA/stent Chronic Combined systolic and diastolic CHF - s/p AICD Acute kidney injury in CKD Stage 4 Hypotension - Hypovolemia Paroxysmal Atrial fibrillation on chronic anticoagulation therapy DM 2 Hypocalcemia Right foot pain Hyperlipidemia  History of present illness:  70 y.o. F with hx of CAD s/p CABG, CHF (EF 20-25%), HTN, HLD, DM2, ischemic colitis, and PVD who presented with a one-week history of vaginal bleeding as well as the acute onset of chest pain and right foot pain 24hrs prior. Patient was seen at [facility] 1 week prior for vaginal bleeding where an US revealed a mass versus clot. She was started on Megace. Baseline hemoglobin had been around around 10, but had dropped to 8.3 on 5/21. Patient continued to take her Eliquis for history of atrial fibrillation.   In the ED her temp was as low as 96.73F, pulse 38, blood pressure 90/57, hemoglobin 5.2, CO2 13, BUN 55, creatinine 2.83. Patient was transfused 3 units. Vascular Surgery was consulted for decreased pulses of the right foot.  Hospital Course:  The pt was admitted to the acute units after her ED evaluation.  She was DNR/NO CODE BLUE, but aggressive care short of this was being delivered.  She was being transfused w/ an initial 3U of PRBC.  Vasc Surgery had seen her in consultation, and a GYN consult was pending.  Unfortunately, despite attempts to resuscitate her, she remained hemodynamically fragile w/ hypotension and brachycardia.   At 08:28, with her daughter at the bedside, [name] died.   Time: 08:28  Signed:  MCCLUNG,JEFFREY T  [facility] 08-04-15, 9:21 AM  The patient died prior to my  exam.  I have composed this Death Summary based upon the admit H&P of Dr. Tamala Julian, a thorough review of the patient's prior medical records, and my discussion with the patient's attending RN.

## 2015-07-27 NOTE — ED Notes (Signed)
Vascular at bedside

## 2015-07-27 NOTE — ED Notes (Signed)
Dr Tamala Julian aware of pt HR ranging from 38-40's.

## 2015-07-27 NOTE — Progress Notes (Signed)
Chaplain provided spiritual care support for the family of the patient who died. There were multiple family members who were very emotional at the death, Chaplain provided grief support, and comfort measures for an extended period of time. They were appreciative of the support from all the staff. Chaplain Yaakov Guthrie [phone]

## 2015-07-27 NOTE — ED Provider Notes (Signed)
Angiocath insertion Performed by: Orpah Greek  Consent: Verbal consent obtained. Risks and benefits: risks, benefits and alternatives were discussed Time out: Immediately prior to procedure a "time out" was called to verify the correct patient, procedure, equipment, support staff and site/side marked as required.  Preparation: Patient was prepped and draped in the usual sterile fashion.  Vein Location: right antecub  Ultrasound Guided  Gauge: 20G  Normal blood return and flush without difficulty Patient tolerance: Patient tolerated the procedure well with no immediate complications.     Orpah Greek, MD 26-Jul-2015 [phone]

## 2015-07-27 NOTE — ED Notes (Signed)
Pt placed on hospital bed for comfort.

## 2015-07-27 NOTE — Progress Notes (Signed)
Subjective: Interval History: Out of the emergency room and medical intensive care unit. Minimal responses. Daughter is in the room with her. Currently hypotensive with a blood pressure 60 systolic. Heart rate in the 40s with wider complex. Receiving third unit of packed blood blood cells. No urine output. Does not have a Foley.  Objective: Vital signs in last 24 hours: Temp:  [93.5 F (34.2 C)-97.4 F (36.3 C)] 93.5 F (34.2 C) (05/29 0805) Pulse Rate:  [38-102] 47 (05/29 0750) Resp:  [13-37] 30 (05/29 0750) BP: (63-119)/(31-84) 71/41 mmHg (05/29 0805) SpO2:  [100 %] 100 % (05/29 0750) Weight:  [184 lb (83.462 kg)] 184 lb (83.462 kg) (05/28 2219)  Intake/Output from previous day: 05/28 0701 - 05/29 0700 In: 30 [I.V.:20; Blood:10] Out: -  Intake/Output this shift: Total I/O In: 385 [I.V.:50; Blood:335] Out: -   No change from early this morning. Right foot cooler than left. Interval to assess neuro status with her current mental status  Lab Results:  Recent Labs  07/06/2015 2333 06/28/2015 2341  WBC 13.5*  --   HGB 5.2* 5.8*  HCT 16.5* 17.0*  PLT 343  --    BMET  Recent Labs  06/29/2015 2333 07/23/15 2341  NA 134* 136  K 5.8* 5.7*  CL 105 104  CO2 13*  --   GLUCOSE 401* 374*  BUN 55* 55*  CREATININE 2.83* 2.80*  CALCIUM 8.7*  --     Studies/Results: Dg Chest 2 View  07/16/2015  CLINICAL DATA:  Chest pain, cough x1 month EXAM: CHEST  2 VIEW COMPARISON:  10/04/2014 FINDINGS: Lungs are clear.  No pleural effusion or pneumothorax. Mild cardiomegaly.  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 Degenerative changes of the visualized thoracolumbar spine. IMPRESSION: No evidence of acute cardiopulmonary disease. Electronically Signed   By: Julian Hy M.D.   On: 07/16/2015 09:47   US Pelvis Complete  07/16/2015  CLINICAL DATA:  Postmenopausal bleeding since May 20th. Calcified fibroid seen on earlier CT. Additional ultrasound performed earlier  today. EXAM: TRANSABDOMINAL ULTRASOUND OF PELVIS TECHNIQUE: Transabdominal ultrasound examination of the pelvis was performed including evaluation of the uterus, ovaries, adnexal regions, and pelvic cul-de-sac. COMPARISON:  CT abdomen dated 09/28/2014 and pelvic ultrasound from earlier today. FINDINGS: Uterus Measurements: 19.2 x 10.3 x 16.3 cm. Diffusely heterogeneous with multiple masses and calcifications. Endometrium Thickness: Endometrium not well seen in its entirety. There is a 7.3 x 5.8 x 7.5 cm soft tissue mass that appears to reside in the endometrial canal, with surrounding fluid/ blood products. Based on the provided images, the mass appears to arise from the anterior-lateral endometrial wall. Right ovary Not seen but no mass or free fluid identified in the right adnexal region. Left ovary Not seen but no mass or free fluid identified in the left adnexal region. Other findings:  No abnormal free fluid. IMPRESSION: 1. Uterus markedly enlarged, measuring at least 19 x 10 x 16 cm. Multiple partially calcified fibroids were seen in the uterus on earlier CT of 09/28/2014 and described on the ultrasound report from earlier today. 2. Soft tissue mass that appears to reside in the endometrial canal, arising from the anterior-lateral wall of the endometrium, measuring 7.3 x 5.8 x 7.5 cm. This mass is surrounded by fluid/blood products, again presumably within the endometrial canal. This mass is highly suspicious for endometrial neoplasm, alternatively submucosal fibroid protruding into the endometrial canal. 3. Ovaries are not appreciated but there is no mass or free fluid seen within either  adnexal region. Electronically Signed   By: Franki Cabot M.D.   On: 07/16/2015 18:15   US Pelvis Complete  07/16/2015  CLINICAL DATA:  Vaginal bleeding, left adnexal pain, history of calcified fibroids EXAM: TRANSABDOMINAL ULTRASOUND OF PELVIS TECHNIQUE: Transabdominal ultrasound examination of the pelvis was performed  including evaluation of the uterus, ovaries, adnexal regions, and pelvic cul-de-sac. COMPARISON:  CT abdomen pelvis dated 09/28/2014 FINDINGS: Uterus Measurements: 15.4 x 7.8 x 7.7 cm. Multiple calcified fibroids, including a dominant 7.0 x 6.7 x 7.7 cm calcified intramural fibroid in the left uterine body. Endometrium Not discretely visualized/distorted by multiple fibroids. Right ovary Not discretely visualized. Left ovary Measurements: 2.4 x 2.0 x 2.2 cm. Normal appearance/no adnexal mass. Other findings:  Moderate pelvic fluid. IMPRESSION: Multiple calcified uterine fibroids, measuring up to 7.7 cm in the left uterine body, better visualized on prior CT. Endometrium and right ovary are not discretely visualized. Left ovary is within normal limits. Electronically Signed   By: Julian Hy M.D.   On: 07/16/2015 12:56   Dg Chest Portable 1 View  Aug 08, 2015  CLINICAL DATA:  Acute onset of generalized weakness and anemia. Initial encounter. EXAM: PORTABLE CHEST 1 VIEW COMPARISON:  Chest radiograph performed 07/16/2015 FINDINGS: The lungs are well-aerated. Mild vascular congestion is noted. There is no evidence of focal opacification, pleural effusion or pneumothorax. The cardiomediastinal silhouette is mildly enlarged. The patient is status post median sternotomy, with evidence of prior CABG. An AICD is noted overlying the left chest wall, with a single lead ending overlying the right ventricle. No acute osseous abnormalities are seen. IMPRESSION: Mild vascular congestion and mild cardiomegaly noted. Lungs remains grossly clear. Electronically Signed   By: Garald Balding M.D.   On: 2015/08/08 01:53   Dg Ankle Right Port  08-08-15  CLINICAL DATA:  Acute onset of right ankle pain and swelling. Weakness and anemia. Initial encounter. EXAM: PORTABLE RIGHT ANKLE - 2 VIEW COMPARISON:  Right ankle radiographs performed 04/19/2014 FINDINGS: There is no evidence of fracture or dislocation. The ankle mortise is  intact; the interosseous space is within normal limits. No talar tilt or subluxation is seen. The joint spaces are preserved. Scattered vascular calcifications are seen. IMPRESSION: 1. No evidence of fracture or dislocation. 2. Scattered vascular calcifications seen. Electronically Signed   By: Garald Balding M.D.   On: August 08, 2015 01:52   Anti-infectives: Anti-infectives    None      Assessment/Plan: s/p * No surgery found * Critically ill. Patient request was DO NOT RESUSCITATE. Daughter is not sure this is her current wishes. Concern regarding perfusion to her right foot but obviously this is not the most pressing issue currently. Following along.   LOS: 0 days   Curt Jews 2015/08/08, 8:33 AM

## 2015-07-27 DEATH — deceased

## 2015-07-28 ENCOUNTER — Encounter: Payer: BLUE CROSS/BLUE SHIELD | Admitting: Obstetrics and Gynecology

## 2015-08-03 ENCOUNTER — Encounter (HOSPITAL_COMMUNITY): Payer: BLUE CROSS/BLUE SHIELD | Admitting: Internal Medicine

## 2015-08-03 LAB — CUP PACEART REMOTE DEVICE CHECK
Battery Remaining Longevity: 90 mo
Battery Remaining Percentage: 87 %
Brady Statistic RV Percent Paced: 1 %
HIGH POWER IMPEDANCE MEASURED VALUE: 65 Ohm
HighPow Impedance: 65 Ohm
Implantable Lead Model: 7122
Lead Channel Impedance Value: 530 Ohm
Lead Channel Pacing Threshold Amplitude: 0.75 V
Lead Channel Setting Pacing Amplitude: 2.5 V
Lead Channel Setting Sensing Sensitivity: 0.5 mV
MDC IDC LEAD IMPLANT DT: 20151228
MDC IDC LEAD LOCATION: 753860
MDC IDC MSMT BATTERY VOLTAGE: 3.05 V
MDC IDC MSMT LEADCHNL RV PACING THRESHOLD PULSEWIDTH: 0.5 ms
MDC IDC MSMT LEADCHNL RV SENSING INTR AMPL: 11.6 mV
MDC IDC PG SERIAL: 7233600
MDC IDC SESS DTM: 20170516060019
MDC IDC SET LEADCHNL RV PACING PULSEWIDTH: 0.5 ms

## 2015-12-17 IMAGING — CR DG CHEST 1V PORT
1 series · 1 of 1 positions shown · non-contrast
Comparison: April 18, 2014.

CLINICAL DATA: Status post PICC line placement.

EXAM:
PORTABLE CHEST - 1 VIEW

[AP]
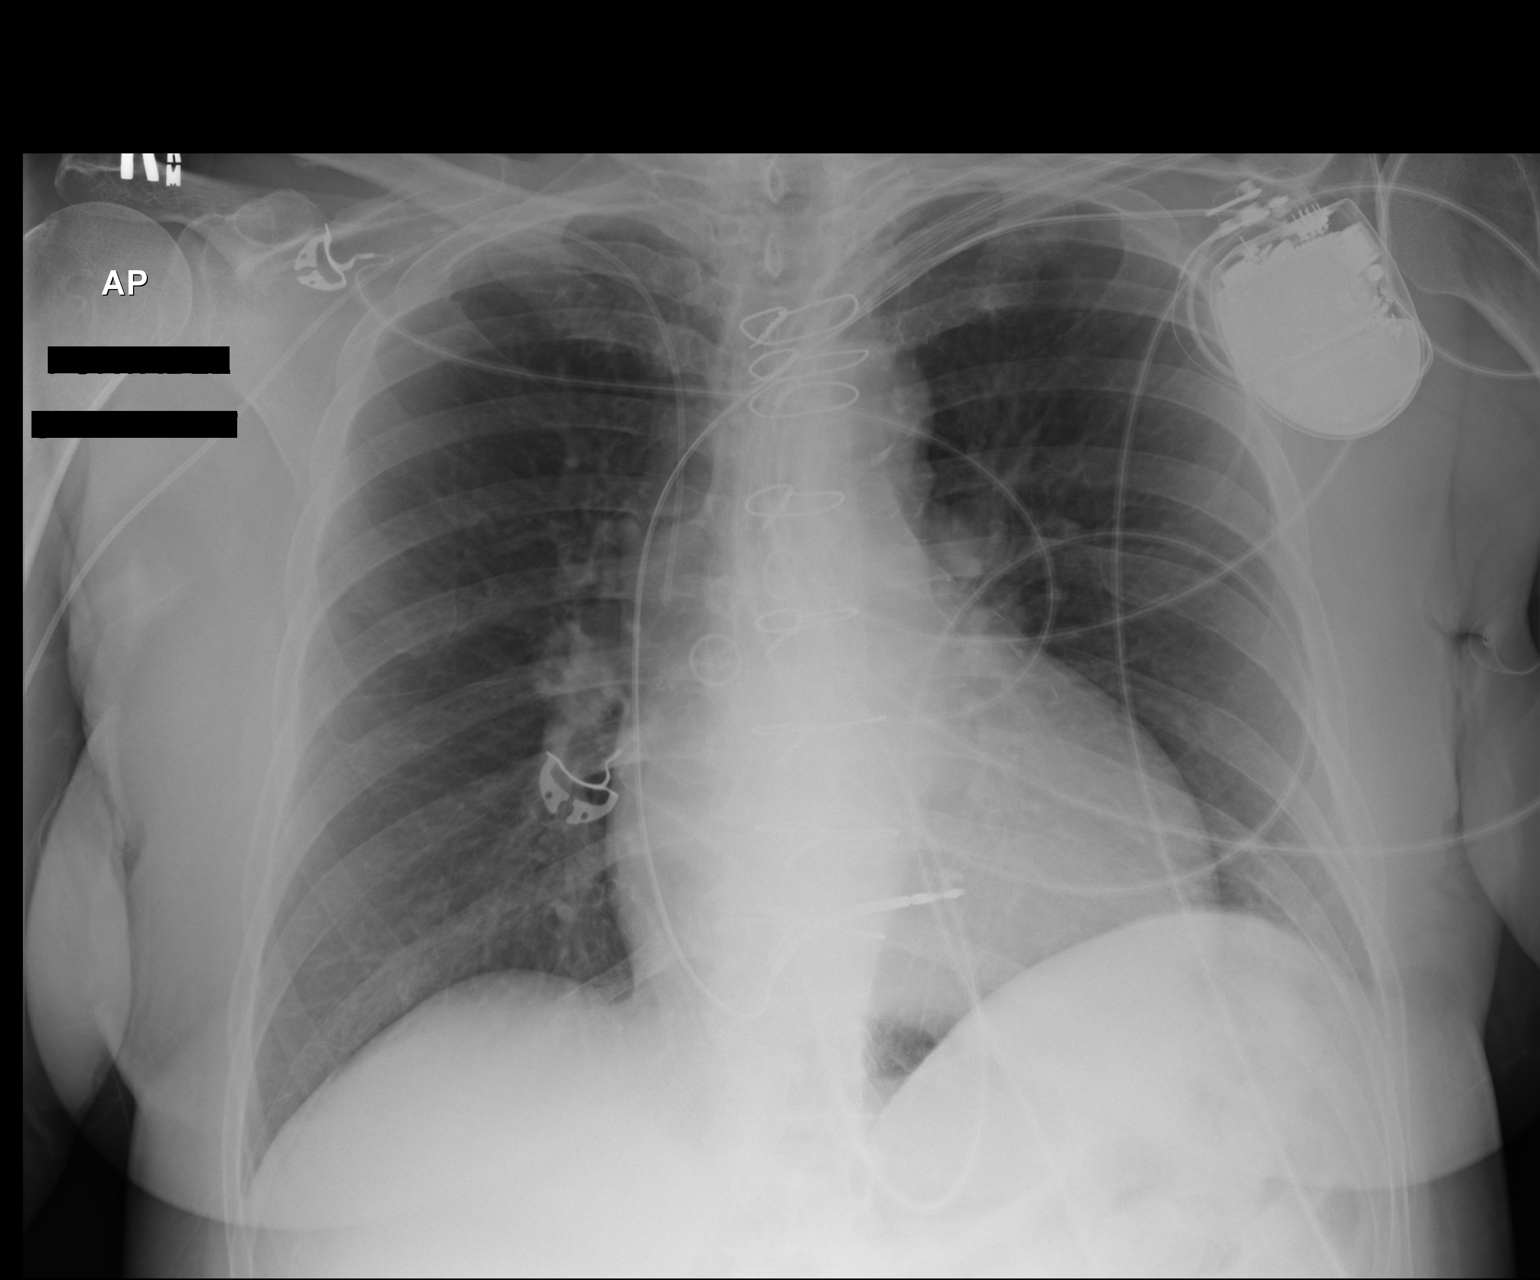

[1 of 1 positions shown; findings below may reference images not displayed]

FINDINGS: Stable cardiomediastinal silhouette. Status post coronary artery
bypass graft. Left-sided pacemaker is unchanged in position. No
pneumothorax or pleural effusion is noted. No acute pulmonary
disease is noted. There has been interval placement of right-sided
PICC line with distal tip overlying expected position of SVC.
IMPRESSION: Interval placement of right-sided PICC line with distal tip
overlying expected position of SVC. No acute cardiopulmonary
abnormality seen.

## 2017-01-18 IMAGING — US US PELVIS COMPLETE
1 series · 15 of 25 positions shown · non-contrast
Comparison: CT abdomen dated [DATE] and pelvic ultrasound from
earlier today.

CLINICAL DATA: Postmenopausal bleeding since [REDACTED].

Calcified fibroid seen on earlier CT. Additional ultrasound
performed earlier today.
EXAM:
TRANSABDOMINAL ULTRASOUND OF PELVIS
TECHNIQUE: Transabdominal ultrasound examination of the pelvis was performed
including evaluation of the uterus, ovaries, adnexal regions, and
pelvic cul-de-sac.

[Series 1: us pelvis complete · 15 of 49 slices shown]
[im 1/49]
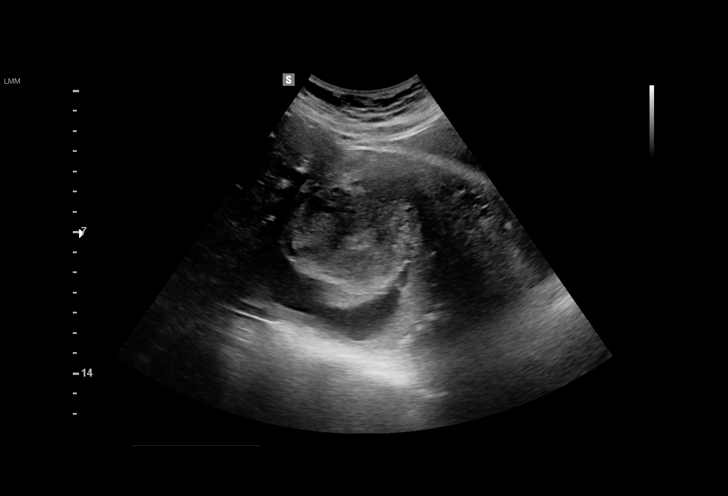
[im 5/49]
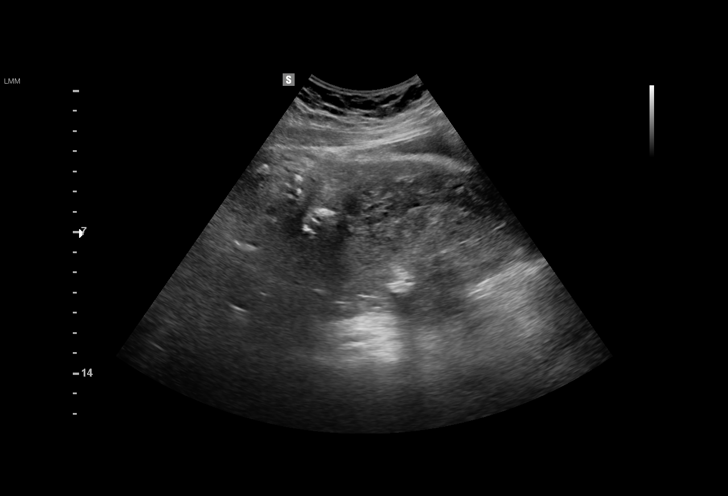
[im 9/49]
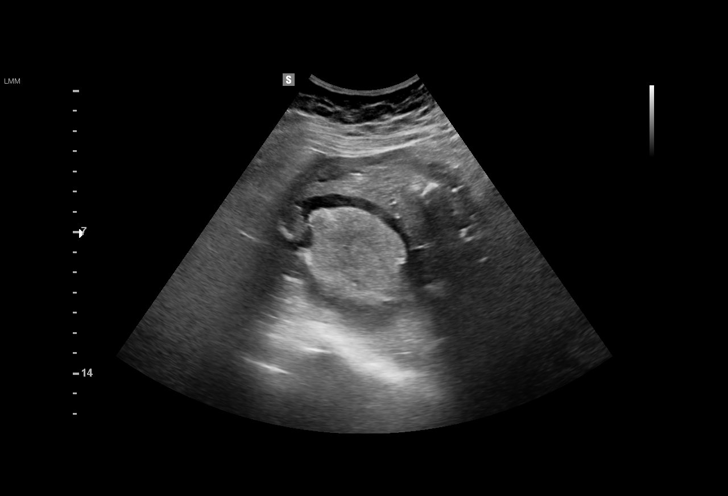
[im 11/49]
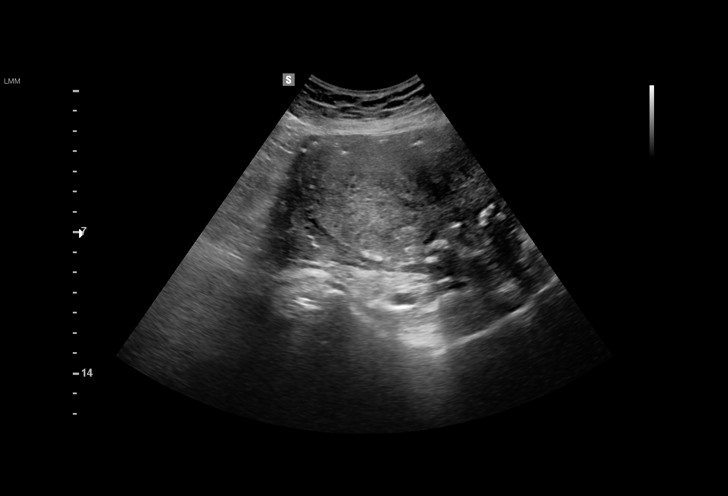
[im 15/49]
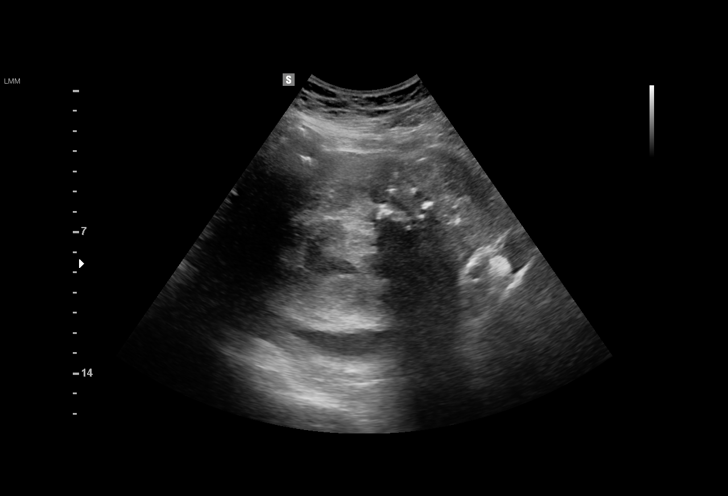
[im 19/49]
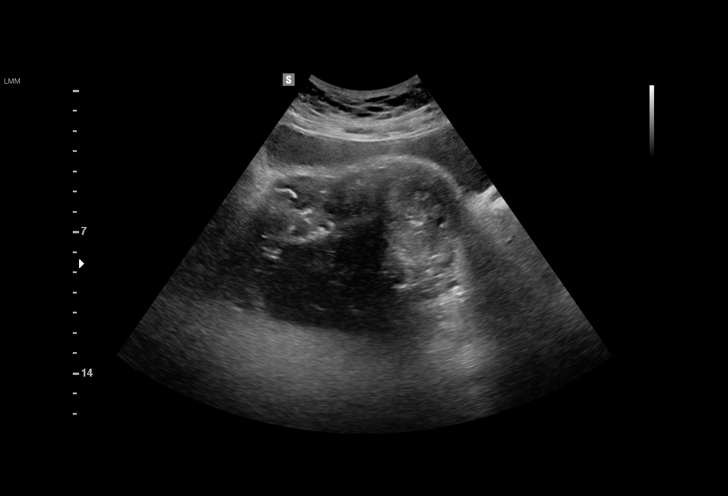
[im 21/49]
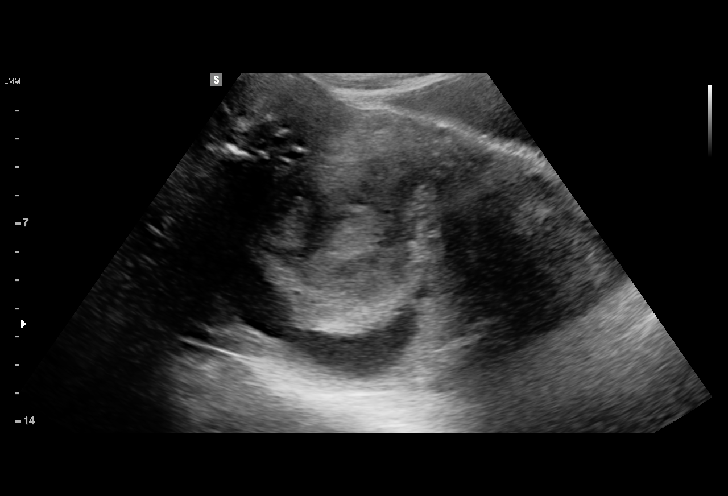
[im 25/49]
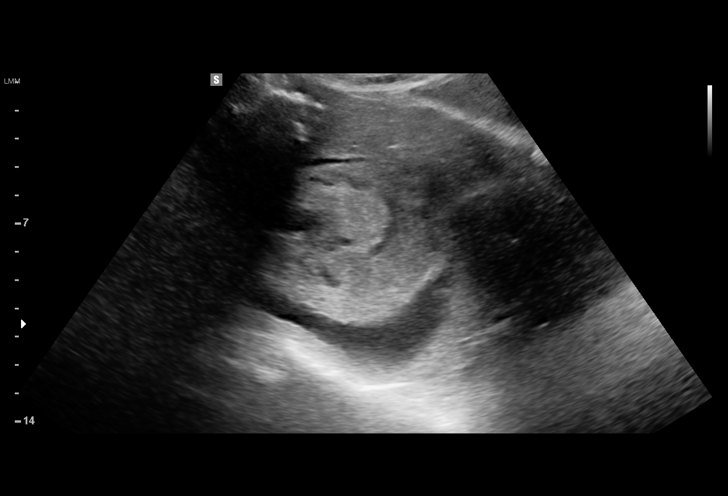
[im 29/49]
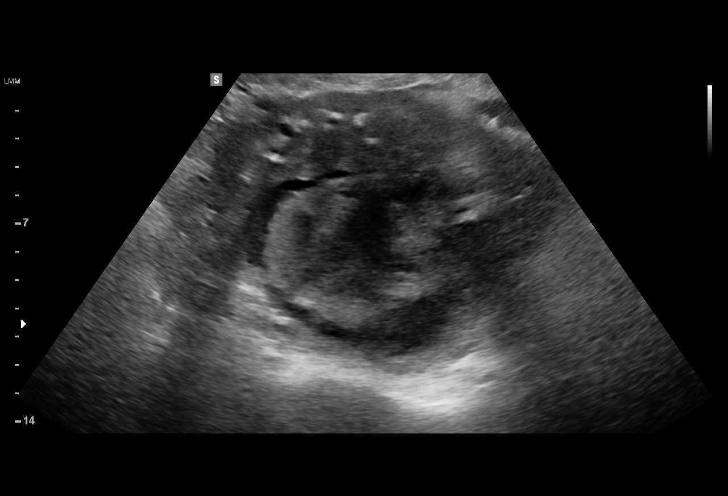
[im 31/49]
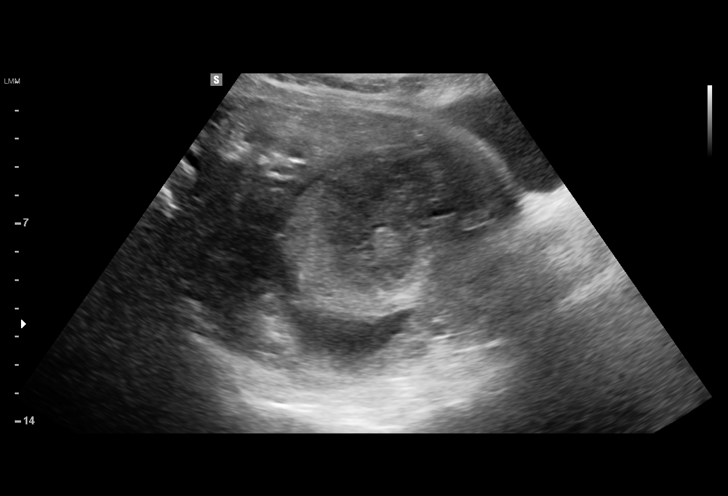
[im 35/49]
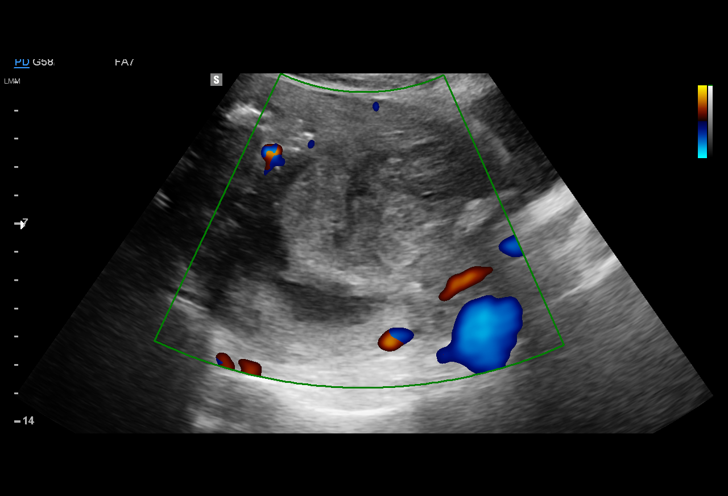
[im 39/49]
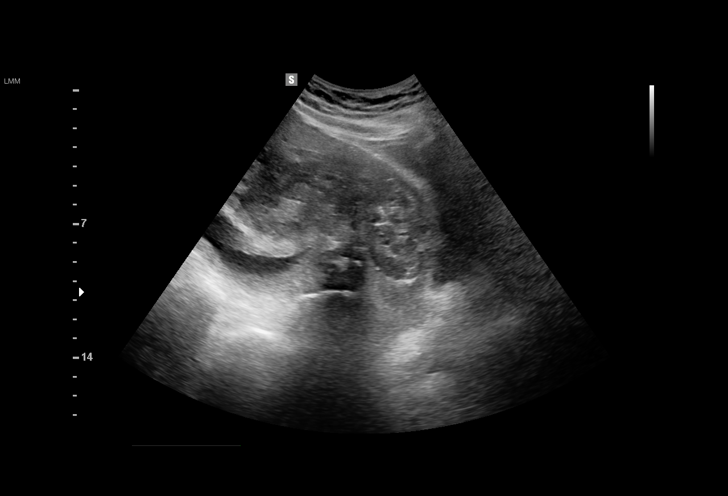
[im 41/49]
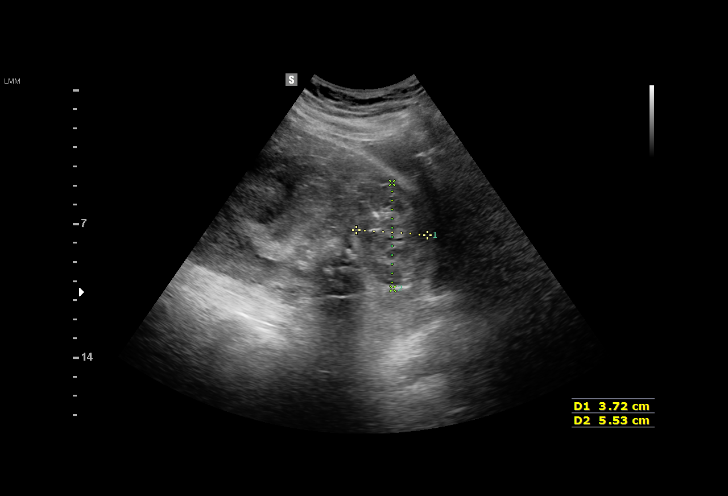
[im 45/49]
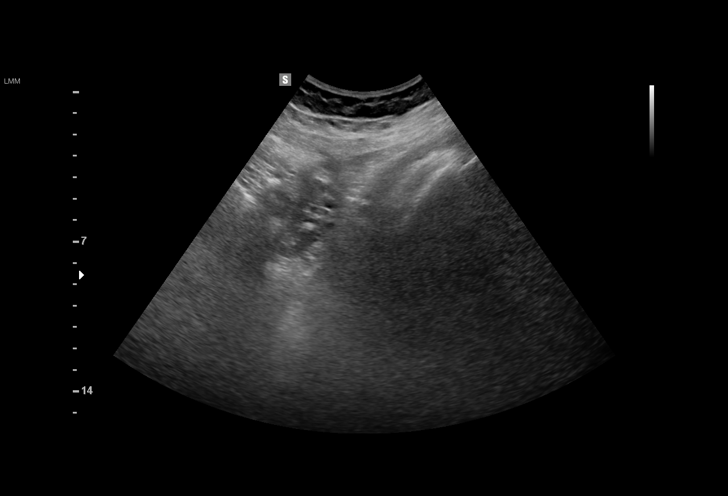
[im 49/49]
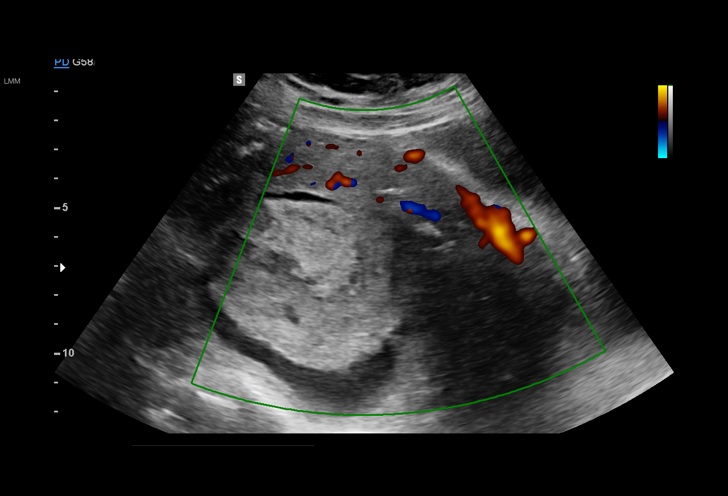

[15 of 25 positions shown; findings below may reference images not displayed]

FINDINGS: Uterus

Measurements: 19.2 x 10.3 x 16.3 cm. Diffusely heterogeneous with
multiple masses and calcifications.

Endometrium

Thickness: Endometrium not well seen in its entirety. There is a
x 5.8 x 7.5 cm soft tissue mass that appears to reside in the
endometrial canal, with surrounding fluid/ blood products. Based on
the provided images, the mass appears to arise from the
anterior-lateral endometrial wall.

Right ovary

Not seen but no mass or free fluid identified in the right adnexal
region.

Left ovary

Not seen but no mass or free fluid identified in the left adnexal
region.

Other findings:  No abnormal free fluid.
IMPRESSION: 1. Uterus markedly enlarged, measuring at least 19 x 10 x 16 cm.
Multiple partially calcified fibroids were seen in the uterus on
earlier CT of 09/28/2014 and described on the ultrasound report from
earlier today.
2. Soft tissue mass that appears to reside in the endometrial canal,
arising from the anterior-lateral wall of the endometrium, measuring
7.3 x 5.8 x 7.5 cm. This mass is surrounded by fluid/blood products,
again presumably within the endometrial canal. This mass is highly
suspicious for endometrial neoplasm, alternatively submucosal
fibroid protruding into the endometrial canal.
3. Ovaries are not appreciated but there is no mass or free fluid
seen within either adnexal region.

## 2017-01-26 IMAGING — CR DG ANKLE PORT 2V*R*
3 series · 3 of 3 positions shown · non-contrast
Comparison: Right ankle radiographs performed 04/19/2014

CLINICAL DATA: Acute onset of right ankle pain and swelling.
Weakness and anemia. Initial encounter.

EXAM:
PORTABLE RIGHT ANKLE - 2 VIEW

[AP]
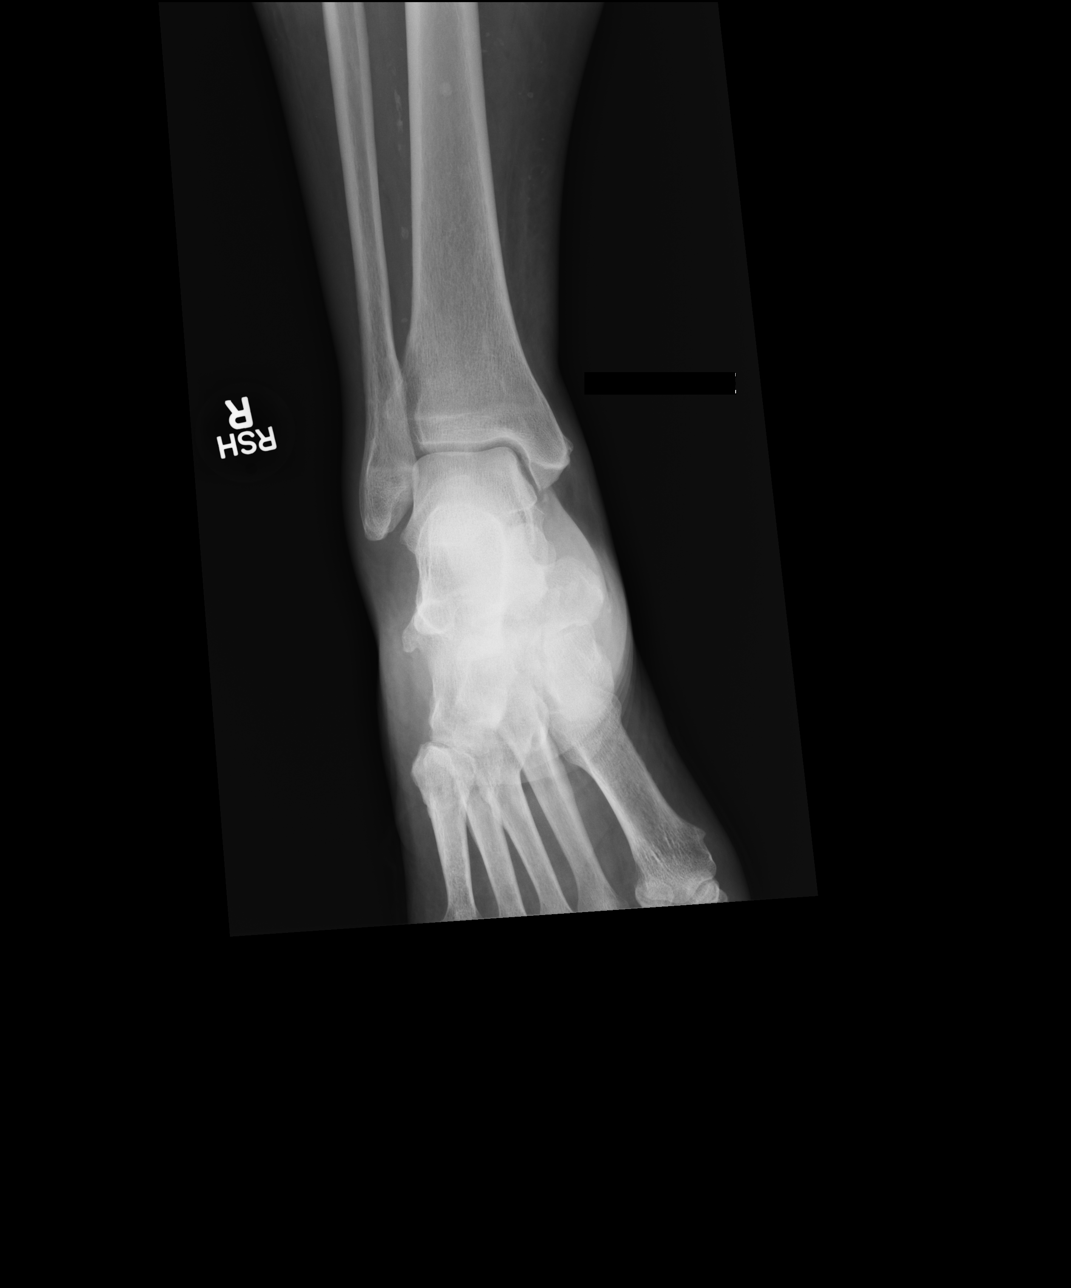

[lateral (1 of 2)]
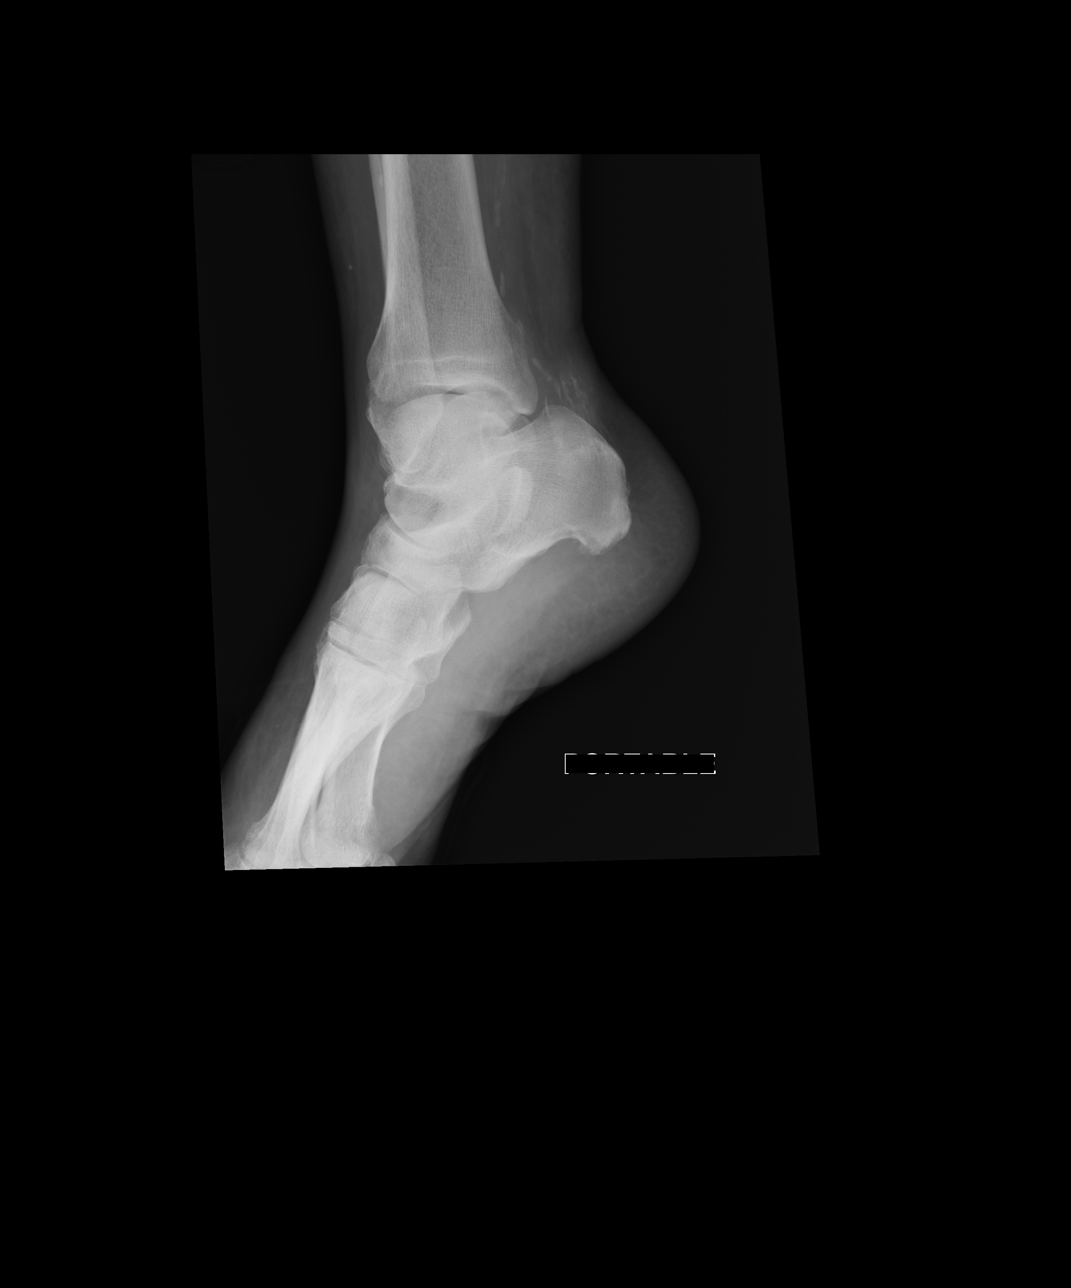

[lateral (2 of 2)]
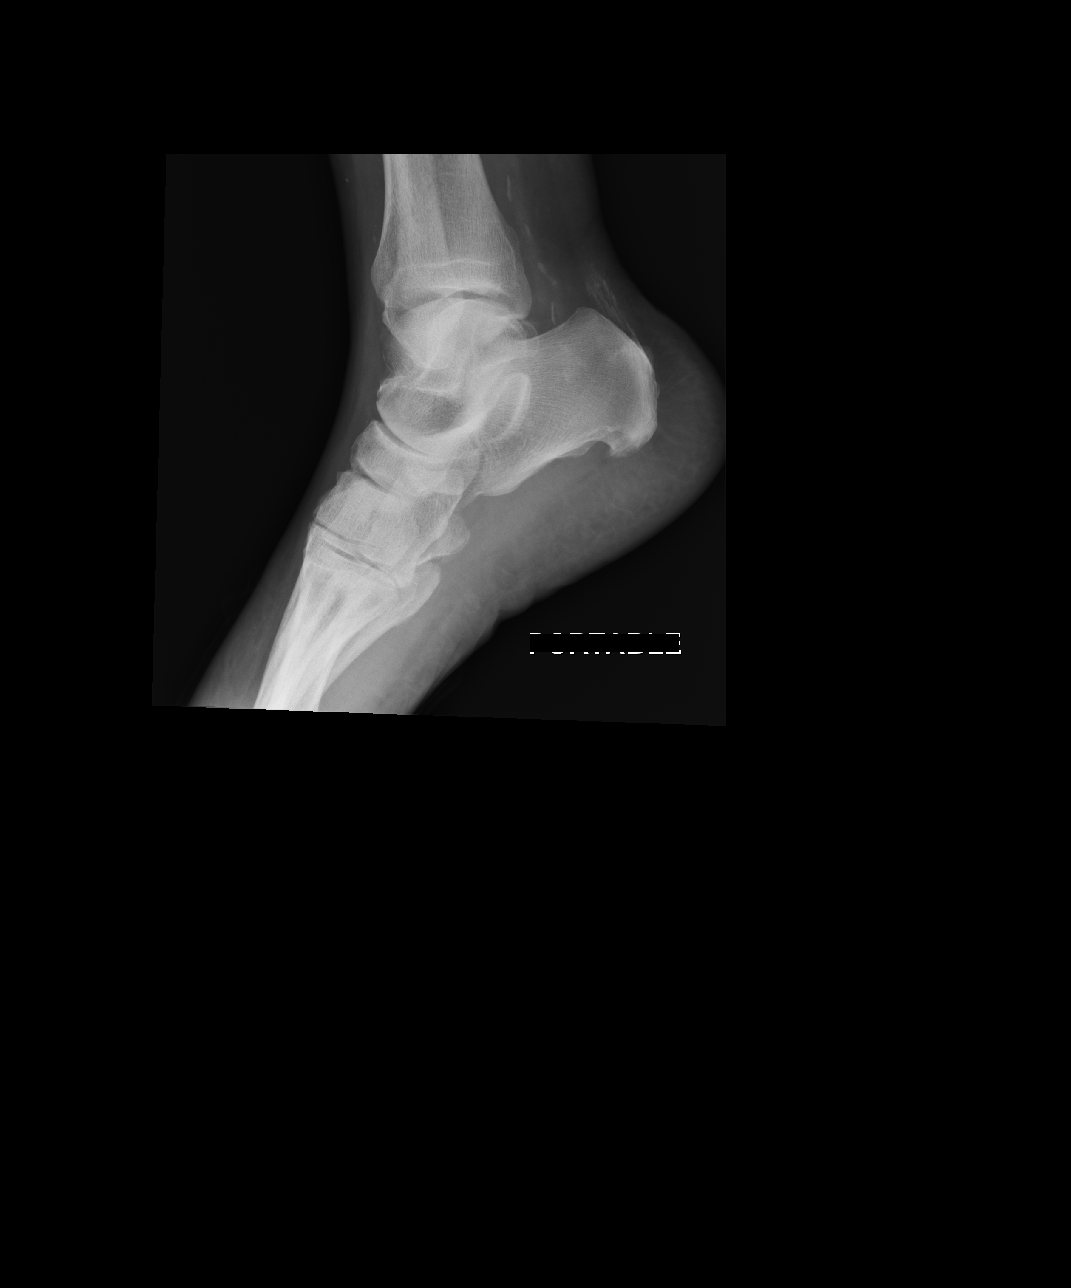

[3 of 3 positions shown; findings below may reference images not displayed]

FINDINGS: There is no evidence of fracture or dislocation. The ankle mortise
is intact; the interosseous space is within normal limits. No talar
tilt or subluxation is seen.

The joint spaces are preserved. Scattered vascular calcifications
are seen.
IMPRESSION: 1. No evidence of fracture or dislocation.
2. Scattered vascular calcifications seen.
